# Patient Record
Sex: Female | Born: 1960
Health system: Southern US, Community
[De-identification: ages and names within clinical notes are randomized; demographics above are authoritative.]

## PROBLEM LIST (undated history)

## (undated) DIAGNOSIS — T7840XA Allergy, unspecified, initial encounter: Secondary | ICD-10-CM

## (undated) DIAGNOSIS — N189 Chronic kidney disease, unspecified: Secondary | ICD-10-CM

## (undated) DIAGNOSIS — I341 Nonrheumatic mitral (valve) prolapse: Secondary | ICD-10-CM

## (undated) DIAGNOSIS — I471 Supraventricular tachycardia: Secondary | ICD-10-CM

## (undated) DIAGNOSIS — F419 Anxiety disorder, unspecified: Secondary | ICD-10-CM

## (undated) DIAGNOSIS — I10 Essential (primary) hypertension: Secondary | ICD-10-CM

## (undated) DIAGNOSIS — J90 Pleural effusion, not elsewhere classified: Secondary | ICD-10-CM

## (undated) DIAGNOSIS — Z9889 Other specified postprocedural states: Secondary | ICD-10-CM

## (undated) DIAGNOSIS — Z8579 Personal history of other malignant neoplasms of lymphoid, hematopoietic and related tissues: Secondary | ICD-10-CM

## (undated) DIAGNOSIS — I340 Nonrheumatic mitral (valve) insufficiency: Secondary | ICD-10-CM

## (undated) HISTORY — DX: Supraventricular tachycardia: I47.1

## (undated) HISTORY — DX: Essential (primary) hypertension: I10

## (undated) HISTORY — DX: Nonrheumatic mitral (valve) prolapse: I34.1

## (undated) HISTORY — DX: Allergy, unspecified, initial encounter: T78.40XA

## (undated) HISTORY — DX: Pleural effusion, not elsewhere classified: J90

## (undated) HISTORY — DX: Nonrheumatic mitral (valve) insufficiency: I34.0

## (undated) HISTORY — DX: Personal history of other malignant neoplasms of lymphoid, hematopoietic and related tissues: Z85.79

---
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
## 1989-06-14 HISTORY — PX: OOPHORECTOMY: SHX86

## 1998-02-05 ENCOUNTER — Other Ambulatory Visit: Admission: RE | Admit: 1998-02-05 | Discharge: 1998-02-05 | Payer: Self-pay | Admitting: *Deleted

## 1998-06-14 HISTORY — PX: BREAST ENHANCEMENT SURGERY: SHX7

## 1999-03-25 ENCOUNTER — Other Ambulatory Visit: Admission: RE | Admit: 1999-03-25 | Discharge: 1999-03-25 | Payer: Self-pay | Admitting: *Deleted

## 2000-03-23 ENCOUNTER — Other Ambulatory Visit: Admission: RE | Admit: 2000-03-23 | Discharge: 2000-03-23 | Payer: Self-pay | Admitting: *Deleted

## 2001-10-05 ENCOUNTER — Other Ambulatory Visit: Admission: RE | Admit: 2001-10-05 | Discharge: 2001-10-05 | Payer: Self-pay | Admitting: *Deleted

## 2003-04-11 ENCOUNTER — Other Ambulatory Visit: Admission: RE | Admit: 2003-04-11 | Discharge: 2003-04-11 | Payer: Self-pay | Admitting: *Deleted

## 2004-10-15 ENCOUNTER — Ambulatory Visit: Payer: Self-pay | Admitting: Family Medicine

## 2004-10-22 ENCOUNTER — Ambulatory Visit: Payer: Self-pay | Admitting: Family Medicine

## 2004-12-10 ENCOUNTER — Ambulatory Visit: Payer: Self-pay | Admitting: Family Medicine

## 2005-02-11 ENCOUNTER — Other Ambulatory Visit: Admission: RE | Admit: 2005-02-11 | Discharge: 2005-02-11 | Payer: Self-pay | Admitting: Obstetrics & Gynecology

## 2005-08-19 ENCOUNTER — Ambulatory Visit: Payer: Self-pay | Admitting: Family Medicine

## 2006-02-18 ENCOUNTER — Encounter: Admission: RE | Admit: 2006-02-18 | Discharge: 2006-02-18 | Payer: Self-pay | Admitting: Family Medicine

## 2006-02-18 ENCOUNTER — Ambulatory Visit: Payer: Self-pay | Admitting: Family Medicine

## 2006-03-03 ENCOUNTER — Ambulatory Visit: Payer: Self-pay | Admitting: Family Medicine

## 2006-03-10 ENCOUNTER — Ambulatory Visit: Payer: Self-pay | Admitting: Family Medicine

## 2006-03-31 ENCOUNTER — Ambulatory Visit: Payer: Self-pay | Admitting: Family Medicine

## 2006-04-18 ENCOUNTER — Ambulatory Visit: Payer: Self-pay | Admitting: Family Medicine

## 2006-04-19 ENCOUNTER — Ambulatory Visit: Payer: Self-pay | Admitting: Family Medicine

## 2006-04-19 LAB — CONVERTED CEMR LAB
ALT: 17 units/L (ref 0–40)
AST: 20 units/L (ref 0–37)
Albumin: 3.6 g/dL (ref 3.5–5.2)
Alkaline Phosphatase: 34 units/L — ABNORMAL LOW (ref 39–117)
Basophils Absolute: 0 10*3/uL (ref 0.0–0.1)
Glomerular Filtration Rate, Af Am: 33 mL/min/{1.73_m2}
Glucose, Bld: 86 mg/dL (ref 70–99)
Lymphocytes Relative: 18.5 % (ref 12.0–46.0)
MCV: 96.3 fL (ref 78.0–100.0)
Monocytes Relative: 10.7 % (ref 3.0–11.0)
Neutro Abs: 2.9 10*3/uL (ref 1.4–7.7)
Platelets: 211 10*3/uL (ref 150–400)
Potassium: 3.2 meq/L — ABNORMAL LOW (ref 3.5–5.1)
RBC: 4.09 M/uL (ref 3.87–5.11)
Sodium: 140 meq/L (ref 135–145)
Total Bilirubin: 0.6 mg/dL (ref 0.3–1.2)
Total Protein: 7.1 g/dL (ref 6.0–8.3)
WBC: 4 10*3/uL — ABNORMAL LOW (ref 4.5–10.5)

## 2006-04-21 ENCOUNTER — Ambulatory Visit: Payer: Self-pay | Admitting: Oncology

## 2006-04-21 ENCOUNTER — Encounter: Admission: RE | Admit: 2006-04-21 | Discharge: 2006-04-21 | Payer: Self-pay | Admitting: Family Medicine

## 2006-05-11 ENCOUNTER — Other Ambulatory Visit: Admission: RE | Admit: 2006-05-11 | Discharge: 2006-05-11 | Payer: Self-pay | Admitting: Oncology

## 2006-05-11 ENCOUNTER — Encounter (INDEPENDENT_AMBULATORY_CARE_PROVIDER_SITE_OTHER): Payer: Self-pay | Admitting: *Deleted

## 2006-05-11 ENCOUNTER — Ambulatory Visit (HOSPITAL_COMMUNITY): Admission: RE | Admit: 2006-05-11 | Discharge: 2006-05-11 | Payer: Self-pay | Admitting: Oncology

## 2006-05-11 LAB — CBC WITH DIFFERENTIAL/PLATELET
BASO%: 0.4 % (ref 0.0–2.0)
EOS%: 0.2 % (ref 0.0–7.0)
HCT: 36.4 % (ref 34.8–46.6)
LYMPH%: 23.4 % (ref 14.0–48.0)
MCH: 32.4 pg (ref 26.0–34.0)
MCHC: 35.2 g/dL (ref 32.0–36.0)
MCV: 92.1 fL (ref 81.0–101.0)
MONO#: 0.4 10*3/uL (ref 0.1–0.9)
MONO%: 9.6 % (ref 0.0–13.0)
NEUT%: 66.4 % (ref 39.6–76.8)
Platelets: 232 10*3/uL (ref 145–400)

## 2006-05-14 LAB — COMPREHENSIVE METABOLIC PANEL WITH GFR
ALT: 13 U/L (ref 0–35)
AST: 15 U/L (ref 0–37)
Albumin: 3.9 g/dL (ref 3.5–5.2)
Alkaline Phosphatase: 34 U/L — ABNORMAL LOW (ref 39–117)
BUN: 27 mg/dL — ABNORMAL HIGH (ref 6–23)
CO2: 17 meq/L — ABNORMAL LOW (ref 19–32)
Calcium: 8.8 mg/dL (ref 8.4–10.5)
Chloride: 110 meq/L (ref 96–112)
Creatinine, Ser: 2.04 mg/dL — ABNORMAL HIGH (ref 0.40–1.20)
Glucose, Bld: 99 mg/dL (ref 70–99)
Potassium: 3.3 meq/L — ABNORMAL LOW (ref 3.5–5.3)
Sodium: 138 meq/L (ref 135–145)
Total Bilirubin: 0.5 mg/dL (ref 0.3–1.2)
Total Protein: 6.7 g/dL (ref 6.0–8.3)

## 2006-05-14 LAB — KAPPA/LAMBDA LIGHT CHAINS
Kappa free light chain: 8.44 mg/dL — ABNORMAL HIGH (ref 0.33–1.94)
Kappa:Lambda Ratio: 13.84 — ABNORMAL HIGH (ref 0.26–1.65)
Lambda Free Lght Chn: 0.61 mg/dL (ref 0.57–2.63)

## 2006-05-16 LAB — UIFE/LIGHT CHAINS/TP QN, 24-HR UR
Alpha 1, Urine: DETECTED — AB
Alpha 2, Urine: DETECTED — AB
Free Kappa Lt Chains,Ur: 6.3 mg/dL — ABNORMAL HIGH (ref 0.04–1.51)
Free Lambda Excretion/Day: 115.08 mg/d
Gamma Globulin, Urine: DETECTED — AB
Time: 24 hours

## 2006-05-25 ENCOUNTER — Ambulatory Visit: Payer: Self-pay | Admitting: Oncology

## 2006-05-27 LAB — BASIC METABOLIC PANEL
BUN: 24 mg/dL — ABNORMAL HIGH (ref 6–23)
Calcium: 9.2 mg/dL (ref 8.4–10.5)
Creatinine, Ser: 1.95 mg/dL — ABNORMAL HIGH (ref 0.40–1.20)
Potassium: 3.8 mEq/L (ref 3.5–5.3)

## 2006-05-27 LAB — CBC WITH DIFFERENTIAL/PLATELET
Basophils Absolute: 0 10*3/uL (ref 0.0–0.1)
EOS%: 0.9 % (ref 0.0–7.0)
HGB: 13 g/dL (ref 11.6–15.9)
LYMPH%: 27.1 % (ref 14.0–48.0)
MCH: 32.6 pg (ref 26.0–34.0)
MCV: 93.1 fL (ref 81.0–101.0)
MONO%: 11.2 % (ref 0.0–13.0)
NEUT%: 59.8 % (ref 39.6–76.8)
Platelets: 196 10*3/uL (ref 145–400)
RDW: 12.7 % (ref 11.3–14.5)

## 2006-05-27 LAB — PREGNANCY, URINE: Preg Test, Ur: NEGATIVE

## 2006-06-03 LAB — BASIC METABOLIC PANEL
CO2: 20 mEq/L (ref 19–32)
Chloride: 107 mEq/L (ref 96–112)
Sodium: 139 mEq/L (ref 135–145)

## 2006-06-03 LAB — CBC WITH DIFFERENTIAL/PLATELET
BASO%: 1.1 % (ref 0.0–2.0)
EOS%: 1.6 % (ref 0.0–7.0)
MCH: 32.3 pg (ref 26.0–34.0)
MCHC: 34.8 g/dL (ref 32.0–36.0)
MONO#: 0.4 10*3/uL (ref 0.1–0.9)
RBC: 4.12 10*6/uL (ref 3.70–5.32)
RDW: 12 % (ref 11.3–14.5)
WBC: 5.4 10*3/uL (ref 3.9–10.0)
lymph#: 1.6 10*3/uL (ref 0.9–3.3)

## 2006-06-10 LAB — CBC WITH DIFFERENTIAL/PLATELET
Basophils Absolute: 0 10*3/uL (ref 0.0–0.1)
EOS%: 3.4 % (ref 0.0–7.0)
Eosinophils Absolute: 0.2 10*3/uL (ref 0.0–0.5)
HCT: 36.6 % (ref 34.8–46.6)
HGB: 12.5 g/dL (ref 11.6–15.9)
MCH: 32.1 pg (ref 26.0–34.0)
MCV: 93.8 fL (ref 81.0–101.0)
MONO%: 6.7 % (ref 0.0–13.0)
NEUT#: 3.3 10*3/uL (ref 1.5–6.5)
NEUT%: 67.8 % (ref 39.6–76.8)
RDW: 12.3 % (ref 11.3–14.5)
lymph#: 1 10*3/uL (ref 0.9–3.3)

## 2006-06-14 HISTORY — PX: LIMBAL STEM CELL TRANSPLANT: SHX1969

## 2006-06-20 LAB — BASIC METABOLIC PANEL
BUN: 30 mg/dL — ABNORMAL HIGH (ref 6–23)
Calcium: 9.8 mg/dL (ref 8.4–10.5)
Glucose, Bld: 83 mg/dL (ref 70–99)

## 2006-06-20 LAB — CBC WITH DIFFERENTIAL/PLATELET
Basophils Absolute: 0 10*3/uL (ref 0.0–0.1)
Eosinophils Absolute: 0.2 10*3/uL (ref 0.0–0.5)
HGB: 12 g/dL (ref 11.6–15.9)
LYMPH%: 18.8 % (ref 14.0–48.0)
MCV: 92.1 fL (ref 81.0–101.0)
MONO%: 9.8 % (ref 0.0–13.0)
NEUT#: 2.6 10*3/uL (ref 1.5–6.5)
Platelets: 137 10*3/uL — ABNORMAL LOW (ref 145–400)

## 2006-06-20 LAB — HCG, SERUM, QUALITATIVE: Preg, Serum: NEGATIVE

## 2006-06-29 LAB — CBC WITH DIFFERENTIAL/PLATELET
BASO%: 0.1 % (ref 0.0–2.0)
EOS%: 0 % (ref 0.0–7.0)
MCH: 32.5 pg (ref 26.0–34.0)
MCHC: 34.9 g/dL (ref 32.0–36.0)
NEUT%: 92.4 % — ABNORMAL HIGH (ref 39.6–76.8)
RDW: 12.2 % (ref 11.3–14.5)
lymph#: 0.3 10*3/uL — ABNORMAL LOW (ref 0.9–3.3)

## 2006-07-04 ENCOUNTER — Ambulatory Visit: Payer: Self-pay | Admitting: Oncology

## 2006-07-06 LAB — CBC WITH DIFFERENTIAL/PLATELET
Basophils Absolute: 0 10*3/uL (ref 0.0–0.1)
EOS%: 0.1 % (ref 0.0–7.0)
HCT: 33.9 % — ABNORMAL LOW (ref 34.8–46.6)
HGB: 11.7 g/dL (ref 11.6–15.9)
MONO#: 0.1 10*3/uL (ref 0.1–0.9)
NEUT#: 4.8 10*3/uL (ref 1.5–6.5)
NEUT%: 91.8 % — ABNORMAL HIGH (ref 39.6–76.8)
RDW: 13.1 % (ref 11.3–14.5)
WBC: 5.2 10*3/uL (ref 3.9–10.0)
lymph#: 0.3 10*3/uL — ABNORMAL LOW (ref 0.9–3.3)

## 2006-07-06 LAB — BASIC METABOLIC PANEL
BUN: 22 mg/dL (ref 6–23)
CO2: 22 mEq/L (ref 19–32)
Chloride: 107 mEq/L (ref 96–112)
Creatinine, Ser: 2.2 mg/dL — ABNORMAL HIGH (ref 0.40–1.20)
Potassium: 3.1 mEq/L — ABNORMAL LOW (ref 3.5–5.3)

## 2006-07-07 LAB — MAGNESIUM: Magnesium: 2.1 mg/dL (ref 1.5–2.5)

## 2006-07-14 LAB — CBC WITH DIFFERENTIAL/PLATELET
BASO%: 2.1 % — ABNORMAL HIGH (ref 0.0–2.0)
Basophils Absolute: 0.1 10*3/uL (ref 0.0–0.1)
HCT: 32.7 % — ABNORMAL LOW (ref 34.8–46.6)
HGB: 11.3 g/dL — ABNORMAL LOW (ref 11.6–15.9)
LYMPH%: 15.9 % (ref 14.0–48.0)
MCH: 32.5 pg (ref 26.0–34.0)
MCHC: 34.7 g/dL (ref 32.0–36.0)
MONO#: 0.5 10*3/uL (ref 0.1–0.9)
NEUT%: 65.7 % (ref 39.6–76.8)
Platelets: 95 10*3/uL — ABNORMAL LOW (ref 145–400)
WBC: 4 10*3/uL (ref 3.9–10.0)
lymph#: 0.6 10*3/uL — ABNORMAL LOW (ref 0.9–3.3)

## 2006-07-17 LAB — KAPPA/LAMBDA LIGHT CHAINS
Kappa free light chain: 8.62 mg/dL — ABNORMAL HIGH (ref 0.33–1.94)
Kappa:Lambda Ratio: 7.56 — ABNORMAL HIGH (ref 0.26–1.65)
Lambda Free Lght Chn: 1.14 mg/dL (ref 0.57–2.63)

## 2006-07-17 LAB — COMPREHENSIVE METABOLIC PANEL
BUN: 25 mg/dL — ABNORMAL HIGH (ref 6–23)
CO2: 22 mEq/L (ref 19–32)
Calcium: 9.2 mg/dL (ref 8.4–10.5)
Chloride: 106 mEq/L (ref 96–112)
Creatinine, Ser: 2.11 mg/dL — ABNORMAL HIGH (ref 0.40–1.20)
Glucose, Bld: 68 mg/dL — ABNORMAL LOW (ref 70–99)
Total Bilirubin: 0.7 mg/dL (ref 0.3–1.2)

## 2006-07-17 LAB — HCG, SERUM, QUALITATIVE: Preg, Serum: NEGATIVE

## 2006-07-17 LAB — MAGNESIUM: Magnesium: 1.9 mg/dL (ref 1.5–2.5)

## 2006-07-18 LAB — CBC WITH DIFFERENTIAL/PLATELET
BASO%: 1.4 % (ref 0.0–2.0)
Basophils Absolute: 0.1 10*3/uL (ref 0.0–0.1)
EOS%: 4 % (ref 0.0–7.0)
HGB: 11.9 g/dL (ref 11.6–15.9)
MCH: 32.8 pg (ref 26.0–34.0)
MCHC: 35.9 g/dL (ref 32.0–36.0)
MCV: 91.3 fL (ref 81.0–101.0)
MONO%: 10.4 % (ref 0.0–13.0)
RDW: 12.9 % (ref 11.3–14.5)
lymph#: 0.7 10*3/uL — ABNORMAL LOW (ref 0.9–3.3)

## 2006-07-27 LAB — CBC WITH DIFFERENTIAL/PLATELET
EOS%: 1.7 % (ref 0.0–7.0)
Eosinophils Absolute: 0.1 10*3/uL (ref 0.0–0.5)
HGB: 10.8 g/dL — ABNORMAL LOW (ref 11.6–15.9)
MCH: 32.3 pg (ref 26.0–34.0)
MCV: 91.3 fL (ref 81.0–101.0)
MONO%: 12.1 % (ref 0.0–13.0)
NEUT#: 1.6 10*3/uL (ref 1.5–6.5)
RBC: 3.36 10*6/uL — ABNORMAL LOW (ref 3.70–5.32)
RDW: 12.7 % (ref 11.3–14.5)
lymph#: 1.1 10*3/uL (ref 0.9–3.3)

## 2006-07-27 LAB — BASIC METABOLIC PANEL
Chloride: 106 mEq/L (ref 96–112)
Potassium: 3.1 mEq/L — ABNORMAL LOW (ref 3.5–5.3)

## 2006-07-27 LAB — PREGNANCY, URINE: Preg Test, Ur: NEGATIVE

## 2006-07-27 LAB — MAGNESIUM: Magnesium: 2.6 mg/dL — ABNORMAL HIGH (ref 1.5–2.5)

## 2006-07-30 LAB — IGG, IGA, IGM
IgG (Immunoglobin G), Serum: 495 mg/dL — ABNORMAL LOW (ref 694–1618)
IgM, Serum: 37 mg/dL — ABNORMAL LOW (ref 60–263)

## 2006-07-30 LAB — BETA 2 MICROGLOBULIN, SERUM: Beta-2 Microglobulin: 3.65 mg/L — ABNORMAL HIGH (ref 1.01–1.73)

## 2006-08-01 LAB — BASIC METABOLIC PANEL
BUN: 19 mg/dL (ref 6–23)
CO2: 25 mEq/L (ref 19–32)
Calcium: 9.2 mg/dL (ref 8.4–10.5)
Chloride: 110 mEq/L (ref 96–112)
Creatinine, Ser: 2.15 mg/dL — ABNORMAL HIGH (ref 0.40–1.20)

## 2006-08-01 LAB — CBC WITH DIFFERENTIAL/PLATELET
BASO%: 1.6 % (ref 0.0–2.0)
Basophils Absolute: 0 10*3/uL (ref 0.0–0.1)
HCT: 29.4 % — ABNORMAL LOW (ref 34.8–46.6)
HGB: 10.4 g/dL — ABNORMAL LOW (ref 11.6–15.9)
LYMPH%: 39.7 % (ref 14.0–48.0)
MCH: 32.7 pg (ref 26.0–34.0)
MCHC: 35.5 g/dL (ref 32.0–36.0)
MONO#: 0.3 10*3/uL (ref 0.1–0.9)
NEUT%: 41.9 % (ref 39.6–76.8)
Platelets: 166 10*3/uL (ref 145–400)
WBC: 2.5 10*3/uL — ABNORMAL LOW (ref 3.9–10.0)

## 2006-08-08 LAB — CBC WITH DIFFERENTIAL/PLATELET
BASO%: 1.3 % (ref 0.0–2.0)
EOS%: 2.3 % (ref 0.0–7.0)
Eosinophils Absolute: 0.1 10*3/uL (ref 0.0–0.5)
LYMPH%: 35.5 % (ref 14.0–48.0)
MCH: 33 pg (ref 26.0–34.0)
MCHC: 35.3 g/dL (ref 32.0–36.0)
MCV: 93.3 fL (ref 81.0–101.0)
MONO%: 7 % (ref 0.0–13.0)
NEUT#: 1.4 10*3/uL — ABNORMAL LOW (ref 1.5–6.5)
Platelets: 196 10*3/uL (ref 145–400)
RBC: 3.31 10*6/uL — ABNORMAL LOW (ref 3.70–5.32)
RDW: 15.6 % — ABNORMAL HIGH (ref 11.3–14.5)

## 2006-08-08 LAB — PREGNANCY, URINE: Preg Test, Ur: NEGATIVE

## 2006-08-17 ENCOUNTER — Ambulatory Visit: Payer: Self-pay | Admitting: Oncology

## 2006-08-22 LAB — COMPREHENSIVE METABOLIC PANEL
Albumin: 4.5 g/dL (ref 3.5–5.2)
BUN: 24 mg/dL — ABNORMAL HIGH (ref 6–23)
CO2: 19 mEq/L (ref 19–32)
Glucose, Bld: 172 mg/dL — ABNORMAL HIGH (ref 70–99)
Potassium: 3.9 mEq/L (ref 3.5–5.3)
Sodium: 139 mEq/L (ref 135–145)
Total Protein: 6.7 g/dL (ref 6.0–8.3)

## 2006-08-22 LAB — CBC WITH DIFFERENTIAL/PLATELET
Basophils Absolute: 0 10*3/uL (ref 0.0–0.1)
Eosinophils Absolute: 0 10*3/uL (ref 0.0–0.5)
HGB: 11.1 g/dL — ABNORMAL LOW (ref 11.6–15.9)
LYMPH%: 6.2 % — ABNORMAL LOW (ref 14.0–48.0)
MCV: 94.7 fL (ref 81.0–101.0)
MONO#: 0 10*3/uL — ABNORMAL LOW (ref 0.1–0.9)
MONO%: 0.3 % (ref 0.0–13.0)
NEUT#: 3.4 10*3/uL (ref 1.5–6.5)
Platelets: 146 10*3/uL (ref 145–400)
RBC: 3.3 10*6/uL — ABNORMAL LOW (ref 3.70–5.32)
RDW: 16.3 % — ABNORMAL HIGH (ref 11.3–14.5)
WBC: 3.6 10*3/uL — ABNORMAL LOW (ref 3.9–10.0)

## 2006-09-01 LAB — CBC WITH DIFFERENTIAL/PLATELET
Basophils Absolute: 0 10*3/uL (ref 0.0–0.1)
Eosinophils Absolute: 0 10*3/uL (ref 0.0–0.5)
HGB: 10.2 g/dL — ABNORMAL LOW (ref 11.6–15.9)
MONO#: 0.3 10*3/uL (ref 0.1–0.9)
NEUT#: 1.3 10*3/uL — ABNORMAL LOW (ref 1.5–6.5)
RBC: 3.02 10*6/uL — ABNORMAL LOW (ref 3.70–5.32)
RDW: 15.7 % — ABNORMAL HIGH (ref 11.3–14.5)
WBC: 2.3 10*3/uL — ABNORMAL LOW (ref 3.9–10.0)
lymph#: 0.7 10*3/uL — ABNORMAL LOW (ref 0.9–3.3)

## 2006-09-05 LAB — BASIC METABOLIC PANEL
CO2: 22 mEq/L (ref 19–32)
Chloride: 107 mEq/L (ref 96–112)
Glucose, Bld: 111 mg/dL — ABNORMAL HIGH (ref 70–99)
Potassium: 3.6 mEq/L (ref 3.5–5.3)
Sodium: 140 mEq/L (ref 135–145)

## 2006-09-05 LAB — KAPPA/LAMBDA LIGHT CHAINS
Kappa free light chain: 4.93 mg/dL — ABNORMAL HIGH (ref 0.33–1.94)
Kappa:Lambda Ratio: 3.33 — ABNORMAL HIGH (ref 0.26–1.65)
Lambda Free Lght Chn: 1.48 mg/dL (ref 0.57–2.63)

## 2006-09-05 LAB — IGG, IGA, IGM: IgG (Immunoglobin G), Serum: 442 mg/dL — ABNORMAL LOW (ref 694–1618)

## 2006-09-05 LAB — BETA 2 MICROGLOBULIN, SERUM: Beta-2 Microglobulin: 3.06 mg/L — ABNORMAL HIGH (ref 1.01–1.73)

## 2006-09-06 LAB — UIFE/LIGHT CHAINS/TP QN, 24-HR UR
Alpha 2, Urine: DETECTED — AB
Beta, Urine: DETECTED — AB
Free Kappa Lt Chains,Ur: 53.2 mg/dL — ABNORMAL HIGH (ref 0.04–1.51)
Free Lt Chn Excr Rate: 904.4 mg/d
Gamma Globulin, Urine: DETECTED — AB
Total Protein, Urine-Ur/day: 1125 mg/d — ABNORMAL HIGH (ref 10–140)

## 2006-09-12 LAB — CBC WITH DIFFERENTIAL/PLATELET
Basophils Absolute: 0.1 10*3/uL (ref 0.0–0.1)
Eosinophils Absolute: 0 10*3/uL (ref 0.0–0.5)
HGB: 11.7 g/dL (ref 11.6–15.9)
MCV: 96.2 fL (ref 81.0–101.0)
MONO%: 3.6 % (ref 0.0–13.0)
NEUT#: 2.5 10*3/uL (ref 1.5–6.5)
RDW: 16 % — ABNORMAL HIGH (ref 11.3–14.5)

## 2006-09-12 LAB — BASIC METABOLIC PANEL
BUN: 27 mg/dL — ABNORMAL HIGH (ref 6–23)
Chloride: 109 mEq/L (ref 96–112)
Glucose, Bld: 90 mg/dL (ref 70–99)
Potassium: 4.4 mEq/L (ref 3.5–5.3)

## 2006-09-15 ENCOUNTER — Other Ambulatory Visit: Admission: RE | Admit: 2006-09-15 | Discharge: 2006-09-15 | Payer: Self-pay | Admitting: Oncology

## 2006-09-15 ENCOUNTER — Encounter (INDEPENDENT_AMBULATORY_CARE_PROVIDER_SITE_OTHER): Payer: Self-pay | Admitting: *Deleted

## 2006-09-28 ENCOUNTER — Ambulatory Visit: Payer: Self-pay | Admitting: Oncology

## 2006-10-03 LAB — CBC WITH DIFFERENTIAL/PLATELET
BASO%: 0.5 % (ref 0.0–2.0)
Basophils Absolute: 0 10*3/uL (ref 0.0–0.1)
HCT: 35.1 % (ref 34.8–46.6)
HGB: 12.4 g/dL (ref 11.6–15.9)
MONO#: 0.3 10*3/uL (ref 0.1–0.9)
NEUT%: 53.8 % (ref 39.6–76.8)
WBC: 2.9 10*3/uL — ABNORMAL LOW (ref 3.9–10.0)
lymph#: 0.9 10*3/uL (ref 0.9–3.3)

## 2006-10-03 LAB — BASIC METABOLIC PANEL
BUN: 24 mg/dL — ABNORMAL HIGH (ref 6–23)
CO2: 23 mEq/L (ref 19–32)
Chloride: 108 mEq/L (ref 96–112)
Creatinine, Ser: 1.95 mg/dL — ABNORMAL HIGH (ref 0.40–1.20)

## 2006-10-24 LAB — CBC WITH DIFFERENTIAL/PLATELET
Basophils Absolute: 0 10*3/uL (ref 0.0–0.1)
Eosinophils Absolute: 0 10*3/uL (ref 0.0–0.5)
HCT: 35 % (ref 34.8–46.6)
LYMPH%: 20.4 % (ref 14.0–48.0)
MCV: 92.5 fL (ref 81.0–101.0)
MONO%: 2.5 % (ref 0.0–13.0)
NEUT#: 2.7 10*3/uL (ref 1.5–6.5)
NEUT%: 75.7 % (ref 39.6–76.8)
Platelets: 97 10*3/uL — ABNORMAL LOW (ref 145–400)
RBC: 3.79 10*6/uL (ref 3.70–5.32)

## 2006-10-26 LAB — CBC WITH DIFFERENTIAL/PLATELET
BASO%: 1.2 % (ref 0.0–2.0)
EOS%: 1.3 % (ref 0.0–7.0)
LYMPH%: 91.6 % — ABNORMAL HIGH (ref 14.0–48.0)
MCH: 33.8 pg (ref 26.0–34.0)
MCHC: 36 g/dL (ref 32.0–36.0)
MCV: 94 fL (ref 81.0–101.0)
MONO%: 3.7 % (ref 0.0–13.0)
NEUT%: 2.2 % — ABNORMAL LOW (ref 39.6–76.8)
Platelets: 53 10*3/uL — ABNORMAL LOW (ref 145–400)
RBC: 3.64 10*6/uL — ABNORMAL LOW (ref 3.70–5.32)
WBC: 0.7 10*3/uL — CL (ref 3.9–10.0)

## 2006-11-02 LAB — CBC WITH DIFFERENTIAL/PLATELET
Basophils Absolute: 0 10*3/uL (ref 0.0–0.1)
Eosinophils Absolute: 0 10*3/uL (ref 0.0–0.5)
HCT: 26.7 % — ABNORMAL LOW (ref 34.8–46.6)
HGB: 9.7 g/dL — ABNORMAL LOW (ref 11.6–15.9)
MCV: 93.8 fL (ref 81.0–101.0)
NEUT#: 6 10*3/uL (ref 1.5–6.5)
NEUT%: 72.1 % (ref 39.6–76.8)
RDW: 12.3 % (ref 11.3–14.5)
lymph#: 1.4 10*3/uL (ref 0.9–3.3)

## 2006-11-08 ENCOUNTER — Ambulatory Visit: Payer: Self-pay | Admitting: Oncology

## 2006-11-08 ENCOUNTER — Ambulatory Visit (HOSPITAL_COMMUNITY): Admission: RE | Admit: 2006-11-08 | Discharge: 2006-11-08 | Payer: Self-pay | Admitting: Oncology

## 2006-11-10 LAB — CBC WITH DIFFERENTIAL/PLATELET
Basophils Absolute: 0 10*3/uL (ref 0.0–0.1)
Eosinophils Absolute: 0.1 10*3/uL (ref 0.0–0.5)
HGB: 10.4 g/dL — ABNORMAL LOW (ref 11.6–15.9)
LYMPH%: 8 % — ABNORMAL LOW (ref 14.0–48.0)
MCH: 33.6 pg (ref 26.0–34.0)
MCV: 94.3 fL (ref 81.0–101.0)
MONO%: 10.8 % (ref 0.0–13.0)
NEUT#: 7 10*3/uL — ABNORMAL HIGH (ref 1.5–6.5)
NEUT%: 80.5 % — ABNORMAL HIGH (ref 39.6–76.8)
Platelets: 393 10*3/uL (ref 145–400)

## 2006-11-10 LAB — BASIC METABOLIC PANEL
BUN: 19 mg/dL (ref 6–23)
Calcium: 9 mg/dL (ref 8.4–10.5)
Creatinine, Ser: 1.83 mg/dL — ABNORMAL HIGH (ref 0.40–1.20)
Glucose, Bld: 88 mg/dL (ref 70–99)
Potassium: 4 mEq/L (ref 3.5–5.3)

## 2006-11-16 ENCOUNTER — Ambulatory Visit: Payer: Self-pay | Admitting: Family Medicine

## 2006-11-24 LAB — CBC WITH DIFFERENTIAL/PLATELET
BASO%: 0.6 % (ref 0.0–2.0)
LYMPH%: 26.1 % (ref 14.0–48.0)
MCHC: 35.4 g/dL (ref 32.0–36.0)
MONO#: 0.4 10*3/uL (ref 0.1–0.9)
Platelets: 285 10*3/uL (ref 145–400)
RBC: 3.26 10*6/uL — ABNORMAL LOW (ref 3.70–5.32)
RDW: 14.9 % — ABNORMAL HIGH (ref 11.3–14.5)
WBC: 3.7 10*3/uL — ABNORMAL LOW (ref 3.9–10.0)

## 2006-11-25 LAB — CA 125: CA 125: 28.1 U/mL (ref 0.0–30.2)

## 2007-01-03 ENCOUNTER — Ambulatory Visit: Payer: Self-pay | Admitting: Oncology

## 2007-01-05 ENCOUNTER — Ambulatory Visit (HOSPITAL_COMMUNITY): Admission: RE | Admit: 2007-01-05 | Discharge: 2007-01-05 | Payer: Self-pay | Admitting: Oncology

## 2007-01-05 LAB — CBC WITH DIFFERENTIAL/PLATELET
BASO%: 1.1 % (ref 0.0–2.0)
Basophils Absolute: 0 10*3/uL (ref 0.0–0.1)
EOS%: 5.2 % (ref 0.0–7.0)
HGB: 11.6 g/dL (ref 11.6–15.9)
MCH: 33.2 pg (ref 26.0–34.0)
MCHC: 36.2 g/dL — ABNORMAL HIGH (ref 32.0–36.0)
RDW: 18.4 % — ABNORMAL HIGH (ref 11.3–14.5)
lymph#: 0.7 10*3/uL — ABNORMAL LOW (ref 0.9–3.3)

## 2007-01-05 LAB — MAGNESIUM: Magnesium: 2.1 mg/dL (ref 1.5–2.5)

## 2007-01-05 LAB — COMPREHENSIVE METABOLIC PANEL
BUN: 14 mg/dL (ref 6–23)
CO2: 21 mEq/L (ref 19–32)
Calcium: 9.1 mg/dL (ref 8.4–10.5)
Creatinine, Ser: 1.35 mg/dL — ABNORMAL HIGH (ref 0.40–1.20)
Glucose, Bld: 84 mg/dL (ref 70–99)
Total Bilirubin: 0.6 mg/dL (ref 0.3–1.2)

## 2007-01-13 DIAGNOSIS — T7840XA Allergy, unspecified, initial encounter: Secondary | ICD-10-CM | POA: Insufficient documentation

## 2007-02-09 LAB — CBC WITH DIFFERENTIAL/PLATELET
BASO%: 0.6 % (ref 0.0–2.0)
Basophils Absolute: 0 10*3/uL (ref 0.0–0.1)
HCT: 34.2 % — ABNORMAL LOW (ref 34.8–46.6)
HGB: 12.3 g/dL (ref 11.6–15.9)
MONO#: 0.6 10*3/uL (ref 0.1–0.9)
NEUT#: 1.7 10*3/uL (ref 1.5–6.5)
NEUT%: 44 % (ref 39.6–76.8)
RDW: 16.6 % — ABNORMAL HIGH (ref 11.3–14.5)
WBC: 3.8 10*3/uL — ABNORMAL LOW (ref 3.9–10.0)
lymph#: 1.5 10*3/uL (ref 0.9–3.3)

## 2007-02-09 LAB — BASIC METABOLIC PANEL
BUN: 24 mg/dL — ABNORMAL HIGH (ref 6–23)
CO2: 23 mEq/L (ref 19–32)
Chloride: 105 mEq/L (ref 96–112)
Creatinine, Ser: 1.7 mg/dL — ABNORMAL HIGH (ref 0.40–1.20)
Potassium: 4.3 mEq/L (ref 3.5–5.3)

## 2007-03-13 ENCOUNTER — Ambulatory Visit: Payer: Self-pay | Admitting: Family Medicine

## 2007-03-13 DIAGNOSIS — C9001 Multiple myeloma in remission: Secondary | ICD-10-CM | POA: Insufficient documentation

## 2007-04-10 ENCOUNTER — Ambulatory Visit: Payer: Self-pay | Admitting: Family Medicine

## 2007-05-08 ENCOUNTER — Ambulatory Visit: Payer: Self-pay | Admitting: Oncology

## 2007-05-15 LAB — BASIC METABOLIC PANEL
Chloride: 106 mEq/L (ref 96–112)
Creatinine, Ser: 1.57 mg/dL — ABNORMAL HIGH (ref 0.40–1.20)

## 2007-05-15 LAB — CBC WITH DIFFERENTIAL/PLATELET
Basophils Absolute: 0 10*3/uL (ref 0.0–0.1)
EOS%: 2.1 % (ref 0.0–7.0)
Eosinophils Absolute: 0.1 10*3/uL (ref 0.0–0.5)
HGB: 14.3 g/dL (ref 11.6–15.9)
LYMPH%: 23.2 % (ref 14.0–48.0)
MCH: 34.3 pg — ABNORMAL HIGH (ref 26.0–34.0)
MCV: 95.2 fL (ref 81.0–101.0)
MONO%: 10.3 % (ref 0.0–13.0)
Platelets: 170 10*3/uL (ref 145–400)
RDW: 13.2 % (ref 11.3–14.5)

## 2007-08-10 ENCOUNTER — Ambulatory Visit: Payer: Self-pay | Admitting: Oncology

## 2007-08-14 LAB — CBC WITH DIFFERENTIAL/PLATELET
BASO%: 1.4 % (ref 0.0–2.0)
EOS%: 0.2 % (ref 0.0–7.0)
Eosinophils Absolute: 0 10*3/uL (ref 0.0–0.5)
LYMPH%: 26 % (ref 14.0–48.0)
MCHC: 35.6 g/dL (ref 32.0–36.0)
MCV: 92.4 fL (ref 81.0–101.0)
MONO%: 18.1 % — ABNORMAL HIGH (ref 0.0–13.0)
NEUT#: 1.5 10*3/uL (ref 1.5–6.5)
RBC: 4.17 10*6/uL (ref 3.70–5.32)
RDW: 12.9 % (ref 11.3–14.5)
WBC: 2.8 10*3/uL — ABNORMAL LOW (ref 3.9–10.0)

## 2007-08-17 LAB — KAPPA/LAMBDA LIGHT CHAINS
Kappa free light chain: 1.14 mg/dL (ref 0.33–1.94)
Lambda Free Lght Chn: 2.49 mg/dL (ref 0.57–2.63)

## 2007-08-17 LAB — IGG, IGA, IGM
IgA: 8 mg/dL — ABNORMAL LOW (ref 68–378)
IgG (Immunoglobin G), Serum: 497 mg/dL — ABNORMAL LOW (ref 694–1618)
IgM, Serum: 51 mg/dL — ABNORMAL LOW (ref 60–263)

## 2007-08-17 LAB — BASIC METABOLIC PANEL
Calcium: 8.6 mg/dL (ref 8.4–10.5)
Glucose, Bld: 98 mg/dL (ref 70–99)
Potassium: 4 mEq/L (ref 3.5–5.3)
Sodium: 141 mEq/L (ref 135–145)

## 2007-11-09 ENCOUNTER — Ambulatory Visit: Payer: Self-pay | Admitting: Oncology

## 2007-11-14 LAB — CBC WITH DIFFERENTIAL/PLATELET
BASO%: 0.8 % (ref 0.0–2.0)
Basophils Absolute: 0 10*3/uL (ref 0.0–0.1)
EOS%: 2.1 % (ref 0.0–7.0)
HGB: 14 g/dL (ref 11.6–15.9)
MCH: 32.6 pg (ref 26.0–34.0)
MCHC: 35.4 g/dL (ref 32.0–36.0)
MCV: 92 fL (ref 81.0–101.0)
MONO%: 9.9 % (ref 0.0–13.0)
RBC: 4.3 10*6/uL (ref 3.70–5.32)
RDW: 12.8 % (ref 11.3–14.5)
lymph#: 1.2 10*3/uL (ref 0.9–3.3)

## 2007-11-14 LAB — BASIC METABOLIC PANEL
BUN: 27 mg/dL — ABNORMAL HIGH (ref 6–23)
Chloride: 106 mEq/L (ref 96–112)
Potassium: 3.9 mEq/L (ref 3.5–5.3)
Sodium: 141 mEq/L (ref 135–145)

## 2007-11-17 LAB — MAGNESIUM: Magnesium: 2.4 mg/dL (ref 1.5–2.5)

## 2007-12-04 DIAGNOSIS — J029 Acute pharyngitis, unspecified: Secondary | ICD-10-CM | POA: Insufficient documentation

## 2007-12-08 ENCOUNTER — Ambulatory Visit: Payer: Self-pay | Admitting: Family Medicine

## 2007-12-08 LAB — CONVERTED CEMR LAB: Rapid Strep: NEGATIVE

## 2007-12-14 ENCOUNTER — Telehealth: Payer: Self-pay | Admitting: Family Medicine

## 2007-12-22 ENCOUNTER — Telehealth: Payer: Self-pay | Admitting: Family Medicine

## 2008-03-07 ENCOUNTER — Ambulatory Visit: Payer: Self-pay | Admitting: Oncology

## 2008-03-14 LAB — CBC WITH DIFFERENTIAL/PLATELET
Basophils Absolute: 0 10*3/uL (ref 0.0–0.1)
Eosinophils Absolute: 0.1 10*3/uL (ref 0.0–0.5)
HCT: 38.2 % (ref 34.8–46.6)
HGB: 13.6 g/dL (ref 11.6–15.9)
LYMPH%: 21.8 % (ref 14.0–48.0)
MCV: 96.3 fL (ref 81.0–101.0)
MONO%: 9.1 % (ref 0.0–13.0)
NEUT#: 3.7 10*3/uL (ref 1.5–6.5)
NEUT%: 67.7 % (ref 39.6–76.8)
Platelets: 211 10*3/uL (ref 145–400)

## 2008-03-15 LAB — IGG, IGA, IGM
IgA: 18 mg/dL — ABNORMAL LOW (ref 68–378)
IgG (Immunoglobin G), Serum: 631 mg/dL — ABNORMAL LOW (ref 694–1618)
IgM, Serum: 35 mg/dL — ABNORMAL LOW (ref 60–263)

## 2008-03-15 LAB — BASIC METABOLIC PANEL
BUN: 31 mg/dL — ABNORMAL HIGH (ref 6–23)
Glucose, Bld: 89 mg/dL (ref 70–99)
Potassium: 4 mEq/L (ref 3.5–5.3)

## 2008-03-15 LAB — KAPPA/LAMBDA LIGHT CHAINS
Kappa free light chain: 1.3 mg/dL (ref 0.33–1.94)
Kappa:Lambda Ratio: 0.87 (ref 0.26–1.65)
Lambda Free Lght Chn: 1.5 mg/dL (ref 0.57–2.63)

## 2008-05-01 ENCOUNTER — Ambulatory Visit: Payer: Self-pay | Admitting: Oncology

## 2008-06-12 ENCOUNTER — Ambulatory Visit: Payer: Self-pay | Admitting: Oncology

## 2008-06-18 LAB — BASIC METABOLIC PANEL
BUN: 29 mg/dL — ABNORMAL HIGH (ref 6–23)
Calcium: 9.8 mg/dL (ref 8.4–10.5)
Glucose, Bld: 74 mg/dL (ref 70–99)

## 2008-06-18 LAB — IGG, IGA, IGM: IgG (Immunoglobin G), Serum: 731 mg/dL (ref 694–1618)

## 2008-08-08 ENCOUNTER — Ambulatory Visit: Payer: Self-pay | Admitting: Oncology

## 2008-08-12 LAB — CBC WITH DIFFERENTIAL/PLATELET
BASO%: 0.4 % (ref 0.0–2.0)
EOS%: 0.9 % (ref 0.0–7.0)
LYMPH%: 26 % (ref 14.0–49.7)
MCHC: 35.1 g/dL (ref 31.5–36.0)
MONO#: 0.4 10*3/uL (ref 0.1–0.9)
Platelets: 164 10*3/uL (ref 145–400)
RBC: 4.13 10*6/uL (ref 3.70–5.45)
WBC: 5.3 10*3/uL (ref 3.9–10.3)

## 2008-08-12 LAB — COMPREHENSIVE METABOLIC PANEL
ALT: 15 U/L (ref 0–35)
AST: 24 U/L (ref 0–37)
Alkaline Phosphatase: 55 U/L (ref 39–117)
CO2: 27 mEq/L (ref 19–32)
Sodium: 140 mEq/L (ref 135–145)
Total Bilirubin: 1 mg/dL (ref 0.3–1.2)
Total Protein: 6.8 g/dL (ref 6.0–8.3)

## 2008-08-14 LAB — KAPPA/LAMBDA LIGHT CHAINS
Kappa free light chain: <0.6 mg/dL (ref 0.33–1.94)
Lambda Free Lght Chn: 1.03 mg/dL (ref 0.57–2.63)

## 2008-10-08 ENCOUNTER — Ambulatory Visit: Payer: Self-pay | Admitting: Family Medicine

## 2008-10-08 DIAGNOSIS — J0110 Acute frontal sinusitis, unspecified: Secondary | ICD-10-CM | POA: Insufficient documentation

## 2008-10-18 ENCOUNTER — Ambulatory Visit: Payer: Self-pay | Admitting: Oncology

## 2008-10-30 ENCOUNTER — Ambulatory Visit: Payer: Self-pay | Admitting: Family Medicine

## 2008-10-30 LAB — CONVERTED CEMR LAB
ALT: 16 U/L
AST: 22 U/L
Albumin: 4 g/dL
Alkaline Phosphatase: 60 U/L
BUN: 29 mg/dL — ABNORMAL HIGH
Basophils Absolute: 0 10*3/uL
Basophils Relative: 0.6 %
Bilirubin Urine: NEGATIVE
Bilirubin, Direct: 0.2 mg/dL
CO2: 28 meq/L
Calcium: 9.4 mg/dL
Chloride: 109 meq/L
Cholesterol: 195 mg/dL
Creatinine, Ser: 1.4 mg/dL — ABNORMAL HIGH
Eosinophils Absolute: 0.1 10*3/uL
Eosinophils Relative: 2.5 %
GFR calc non Af Amer: 42.39 mL/min
Glucose, Bld: 79 mg/dL
HCT: 39.7 %
HDL: 76.9 mg/dL
Hemoglobin: 14 g/dL
Ketones, ur: NEGATIVE mg/dL
LDL Cholesterol: 108 mg/dL — ABNORMAL HIGH
Leukocytes, UA: NEGATIVE
Lymphocytes Relative: 29.4 %
Lymphs Abs: 1 10*3/uL
MCHC: 35.2 g/dL
MCV: 97.6 fL
Monocytes Absolute: 0.4 10*3/uL
Monocytes Relative: 10.4 %
Neutro Abs: 2 10*3/uL
Neutrophils Relative %: 57.1 %
Nitrite: NEGATIVE
Platelets: 161 10*3/uL
Potassium: 4.1 meq/L
RBC: 4.06 M/uL
RDW: 12.7 %
Sodium: 141 meq/L
Specific Gravity, Urine: 1.015
TSH: 2.31 u[IU]/mL
Total Bilirubin: 0.9 mg/dL
Total CHOL/HDL Ratio: 3
Total Protein: 6.3 g/dL
Triglycerides: 51 mg/dL
Urine Glucose: NEGATIVE mg/dL
Urobilinogen, UA: 0.2
VLDL: 10.2 mg/dL
WBC: 3.5 10*3/uL — ABNORMAL LOW
pH: 6

## 2008-11-05 ENCOUNTER — Encounter: Payer: Self-pay | Admitting: Family Medicine

## 2008-11-06 ENCOUNTER — Encounter: Payer: Self-pay | Admitting: Family Medicine

## 2008-11-06 ENCOUNTER — Other Ambulatory Visit: Admission: RE | Admit: 2008-11-06 | Discharge: 2008-11-06 | Payer: Self-pay | Admitting: Family Medicine

## 2008-11-06 ENCOUNTER — Ambulatory Visit: Payer: Self-pay | Admitting: Family Medicine

## 2008-11-06 DIAGNOSIS — I10 Essential (primary) hypertension: Secondary | ICD-10-CM | POA: Insufficient documentation

## 2008-11-06 LAB — HM PAP SMEAR

## 2008-12-06 ENCOUNTER — Ambulatory Visit: Payer: Self-pay | Admitting: Internal Medicine

## 2008-12-06 ENCOUNTER — Encounter: Admission: RE | Admit: 2008-12-06 | Discharge: 2008-12-06 | Payer: Self-pay | Admitting: Family Medicine

## 2008-12-06 LAB — HM MAMMOGRAPHY

## 2008-12-23 ENCOUNTER — Telehealth: Payer: Self-pay | Admitting: *Deleted

## 2008-12-24 ENCOUNTER — Ambulatory Visit: Payer: Self-pay | Admitting: Family Medicine

## 2008-12-24 DIAGNOSIS — M810 Age-related osteoporosis without current pathological fracture: Secondary | ICD-10-CM | POA: Insufficient documentation

## 2008-12-31 ENCOUNTER — Telehealth: Payer: Self-pay | Admitting: Family Medicine

## 2009-01-07 ENCOUNTER — Ambulatory Visit: Payer: Self-pay | Admitting: Oncology

## 2009-01-10 LAB — CBC WITH DIFFERENTIAL/PLATELET
BASO%: 0.8 % (ref 0.0–2.0)
LYMPH%: 35.9 % (ref 14.0–49.7)
MCHC: 35 g/dL (ref 31.5–36.0)
MONO#: 0.3 10*3/uL (ref 0.1–0.9)
RBC: 4.17 10*6/uL (ref 3.70–5.45)
RDW: 12.8 % (ref 11.2–14.5)
WBC: 3.3 10*3/uL — ABNORMAL LOW (ref 3.9–10.3)
lymph#: 1.2 10*3/uL (ref 0.9–3.3)

## 2009-01-14 LAB — SPEP & IFE WITH QIG
Alpha-2-Globulin: 9.7 % (ref 7.1–11.8)
Gamma Globulin: 9.3 % — ABNORMAL LOW (ref 11.1–18.8)
IgG (Immunoglobin G), Serum: 687 mg/dL — ABNORMAL LOW (ref 694–1618)
IgM, Serum: 32 mg/dL — ABNORMAL LOW (ref 60–263)

## 2009-01-14 LAB — COMPREHENSIVE METABOLIC PANEL
ALT: 19 U/L (ref 0–35)
CO2: 21 mEq/L (ref 19–32)
Chloride: 108 mEq/L (ref 96–112)
Sodium: 141 mEq/L (ref 135–145)
Total Bilirubin: 0.7 mg/dL (ref 0.3–1.2)
Total Protein: 6.4 g/dL (ref 6.0–8.3)

## 2009-01-14 LAB — KAPPA/LAMBDA LIGHT CHAINS: Kappa free light chain: 0.83 mg/dL (ref 0.33–1.94)

## 2009-03-06 ENCOUNTER — Telehealth: Payer: Self-pay | Admitting: Family Medicine

## 2009-03-20 ENCOUNTER — Telehealth: Payer: Self-pay | Admitting: Family Medicine

## 2009-04-30 ENCOUNTER — Ambulatory Visit: Payer: Self-pay | Admitting: Oncology

## 2009-05-02 LAB — CBC WITH DIFFERENTIAL/PLATELET
Basophils Absolute: 0 10*3/uL (ref 0.0–0.1)
EOS%: 2 % (ref 0.0–7.0)
Eosinophils Absolute: 0.1 10*3/uL (ref 0.0–0.5)
HGB: 14.3 g/dL (ref 11.6–15.9)
NEUT#: 1.9 10*3/uL (ref 1.5–6.5)
RBC: 4.16 10*6/uL (ref 3.70–5.45)
RDW: 12.6 % (ref 11.2–14.5)
WBC: 3.4 10*3/uL — ABNORMAL LOW (ref 3.9–10.3)
lymph#: 1.1 10*3/uL (ref 0.9–3.3)

## 2009-05-06 LAB — COMPREHENSIVE METABOLIC PANEL
AST: 19 U/L (ref 0–37)
Albumin: 4.6 g/dL (ref 3.5–5.2)
Alkaline Phosphatase: 59 U/L (ref 39–117)
BUN: 23 mg/dL (ref 6–23)
Calcium: 9.3 mg/dL (ref 8.4–10.5)
Chloride: 104 mEq/L (ref 96–112)
Glucose, Bld: 63 mg/dL — ABNORMAL LOW (ref 70–99)
Potassium: 4 mEq/L (ref 3.5–5.3)
Sodium: 141 mEq/L (ref 135–145)
Total Protein: 6.7 g/dL (ref 6.0–8.3)

## 2009-05-06 LAB — IMMUNOFIXATION ELECTROPHORESIS
IgA: 43 mg/dL — ABNORMAL LOW (ref 68–378)
IgG (Immunoglobin G), Serum: 693 mg/dL — ABNORMAL LOW (ref 694–1618)
IgM, Serum: 33 mg/dL — ABNORMAL LOW (ref 60–263)

## 2009-07-17 ENCOUNTER — Ambulatory Visit: Payer: Self-pay | Admitting: Oncology

## 2009-08-29 ENCOUNTER — Ambulatory Visit: Payer: Self-pay | Admitting: Oncology

## 2009-09-02 LAB — CBC WITH DIFFERENTIAL/PLATELET
BASO%: 0.4 % (ref 0.0–2.0)
EOS%: 1 % (ref 0.0–7.0)
HCT: 42.5 % (ref 34.8–46.6)
HGB: 14.9 g/dL (ref 11.6–15.9)
MCH: 34.8 pg — ABNORMAL HIGH (ref 25.1–34.0)
MCHC: 35.1 g/dL (ref 31.5–36.0)
MONO#: 0.4 10*3/uL (ref 0.1–0.9)
RDW: 13.3 % (ref 11.2–14.5)
WBC: 4.7 10*3/uL (ref 3.9–10.3)
lymph#: 1.1 10*3/uL (ref 0.9–3.3)

## 2009-09-03 LAB — BASIC METABOLIC PANEL
CO2: 24 mEq/L (ref 19–32)
Chloride: 103 mEq/L (ref 96–112)
Potassium: 4.3 mEq/L (ref 3.5–5.3)

## 2009-09-03 LAB — KAPPA/LAMBDA LIGHT CHAINS
Kappa:Lambda Ratio: 0.97 (ref 0.26–1.65)
Lambda Free Lght Chn: 0.92 mg/dL (ref 0.57–2.63)

## 2009-09-22 ENCOUNTER — Ambulatory Visit (HOSPITAL_COMMUNITY): Admission: RE | Admit: 2009-09-22 | Discharge: 2009-09-22 | Payer: Self-pay | Admitting: Oncology

## 2009-09-25 ENCOUNTER — Telehealth: Payer: Self-pay | Admitting: Family Medicine

## 2009-12-18 ENCOUNTER — Ambulatory Visit: Payer: Self-pay | Admitting: Oncology

## 2009-12-22 LAB — BASIC METABOLIC PANEL
CO2: 30 mEq/L (ref 19–32)
Chloride: 103 mEq/L (ref 96–112)
Creatinine, Ser: 1.43 mg/dL — ABNORMAL HIGH (ref 0.40–1.20)
Sodium: 138 mEq/L (ref 135–145)

## 2009-12-22 LAB — CBC WITH DIFFERENTIAL/PLATELET
BASO%: 0.4 % (ref 0.0–2.0)
EOS%: 0.9 % (ref 0.0–7.0)
MCH: 34.7 pg — ABNORMAL HIGH (ref 25.1–34.0)
MCHC: 35.1 g/dL (ref 31.5–36.0)
MONO#: 0.6 10*3/uL (ref 0.1–0.9)
NEUT%: 67.1 % (ref 38.4–76.8)
RBC: 4.07 10*6/uL (ref 3.70–5.45)
RDW: 12.9 % (ref 11.2–14.5)
WBC: 6.6 10*3/uL (ref 3.9–10.3)
lymph#: 1.5 10*3/uL (ref 0.9–3.3)

## 2009-12-23 LAB — KAPPA/LAMBDA LIGHT CHAINS
Kappa:Lambda Ratio: 0.86 (ref 0.26–1.65)
Lambda Free Lght Chn: 0.88 mg/dL (ref 0.57–2.63)

## 2010-04-21 ENCOUNTER — Ambulatory Visit: Payer: Self-pay | Admitting: Oncology

## 2010-04-23 ENCOUNTER — Encounter: Payer: Self-pay | Admitting: Family Medicine

## 2010-04-23 LAB — BASIC METABOLIC PANEL
BUN: 29 mg/dL — ABNORMAL HIGH (ref 6–23)
CO2: 27 mEq/L (ref 19–32)
Chloride: 102 mEq/L (ref 96–112)
Glucose, Bld: 107 mg/dL — ABNORMAL HIGH (ref 70–99)
Potassium: 4.3 mEq/L (ref 3.5–5.3)

## 2010-04-23 LAB — CBC WITH DIFFERENTIAL/PLATELET
Basophils Absolute: 0 10*3/uL (ref 0.0–0.1)
EOS%: 0.9 % (ref 0.0–7.0)
Eosinophils Absolute: 0 10*3/uL (ref 0.0–0.5)
HCT: 42.2 % (ref 34.8–46.6)
HGB: 14.8 g/dL (ref 11.6–15.9)
MONO#: 0.6 10*3/uL (ref 0.1–0.9)
NEUT#: 3.3 10*3/uL (ref 1.5–6.5)
RDW: 13 % (ref 11.2–14.5)
WBC: 4.9 10*3/uL (ref 3.9–10.3)
lymph#: 1 10*3/uL (ref 0.9–3.3)

## 2010-04-24 LAB — KAPPA/LAMBDA LIGHT CHAINS: Lambda Free Lght Chn: 0.6 mg/dL (ref 0.57–2.63)

## 2010-07-03 ENCOUNTER — Telehealth: Payer: Self-pay | Admitting: Family Medicine

## 2010-07-05 ENCOUNTER — Encounter: Payer: Self-pay | Admitting: Oncology

## 2010-07-16 NOTE — Progress Notes (Signed)
       New/Updated Medications: PREMARIN 0.625 MG/GM CREA (ESTROGENS, CONJUGATED) apply 2 x week .Marland Kitchen...no applicator Prescriptions: PREMARIN 0.625 MG/GM CREA (ESTROGENS, CONJUGATED) apply 2 x week .Marland Kitchen...no applicator  #3 tubes x 4   Entered and Authorized by:   Roderick Pee MD   Signed by:   Roderick Pee MD on 09/25/2009   Method used:   Print then Give to Patient   RxID:   [id number]

## 2010-07-16 NOTE — Letter (Signed)
Summary: [facility]  [facility]   Imported By: Maryln Gottron 05/06/2010 10:23:47  _____________________________________________________________________  External Attachment:    Type:   Image     Comment:   External Document

## 2010-07-16 NOTE — Progress Notes (Signed)
Summary: med refill  Phone Note Call from Patient   Caller: Patient Call For: Cristina Pee MD Summary of Call: pt needs refill on lisionpril 5 mg [facility], pt has ov sch for 08-27-2010 Initial call taken by: Heron Sabins,  July 03, 2010 8:35 AM    Prescriptions: ZESTRIL 5 MG TABS (LISINOPRIL) Take 1 tablet by mouth every morning  #100 Tablet x 1   Entered by:   Kern Reap CMA (AAMA)   Authorized by:   Cristina Pee MD   Signed by:   Kern Reap CMA (AAMA) on 07/03/2010   Method used:   [facility]        [facility] [phone]* (retail)       [address]       [place], [place]  [address]       Ph: [phone] or [phone]       Fax: [phone]   RxID:   [id number]

## 2010-07-26 ENCOUNTER — Other Ambulatory Visit: Payer: Self-pay | Admitting: Family Medicine

## 2010-07-28 ENCOUNTER — Telehealth: Payer: Self-pay | Admitting: Family Medicine

## 2010-07-28 MED ORDER — ALENDRONATE SODIUM 70 MG PO TABS: 70.0000 mg | ORAL_TABLET | ORAL | Status: DC

## 2010-07-28 NOTE — Telephone Encounter (Signed)
[facility].

## 2010-07-29 ENCOUNTER — Other Ambulatory Visit: Payer: Self-pay | Admitting: *Deleted

## 2010-07-29 MED ORDER — ALENDRONATE SODIUM 70 MG PO TABS: 70.0000 mg | ORAL_TABLET | ORAL | Status: DC

## 2010-08-13 ENCOUNTER — Other Ambulatory Visit: Payer: Self-pay | Admitting: Family Medicine

## 2010-08-13 ENCOUNTER — Other Ambulatory Visit: Payer: Self-pay

## 2010-08-13 ENCOUNTER — Encounter (INDEPENDENT_AMBULATORY_CARE_PROVIDER_SITE_OTHER): Payer: Self-pay | Admitting: *Deleted

## 2010-08-13 DIAGNOSIS — Z0000 Encounter for general adult medical examination without abnormal findings: Secondary | ICD-10-CM

## 2010-08-13 DIAGNOSIS — E785 Hyperlipidemia, unspecified: Secondary | ICD-10-CM

## 2010-08-13 LAB — HEPATIC FUNCTION PANEL
AST: 19 U/L (ref 0–37)
Alkaline Phosphatase: 41 U/L (ref 39–117)
Total Bilirubin: 1.3 mg/dL — ABNORMAL HIGH (ref 0.3–1.2)

## 2010-08-13 LAB — BASIC METABOLIC PANEL
GFR: 49.33 mL/min — ABNORMAL LOW (ref >60.00)
Glucose, Bld: 66 mg/dL — ABNORMAL LOW (ref 70–99)
Potassium: 4.2 mEq/L (ref 3.5–5.1)
Sodium: 136 mEq/L (ref 135–145)

## 2010-08-13 LAB — URINALYSIS
Ketones, ur: NEGATIVE
Specific Gravity, Urine: <=1.005 (ref 1.000–1.030)
Total Protein, Urine: NEGATIVE
Urine Glucose: NEGATIVE
pH: 6 (ref 5.0–8.0)

## 2010-08-13 LAB — CBC WITH DIFFERENTIAL/PLATELET
Basophils Relative: 0.3 % (ref 0.0–3.0)
Eosinophils Absolute: 0.1 10*3/uL (ref 0.0–0.7)
HCT: 44.2 % (ref 36.0–46.0)
Hemoglobin: 15.4 g/dL — ABNORMAL HIGH (ref 12.0–15.0)
Lymphocytes Relative: 33.2 % (ref 12.0–46.0)
Lymphs Abs: 1.5 10*3/uL (ref 0.7–4.0)
MCHC: 34.8 g/dL (ref 30.0–36.0)
Neutro Abs: 2.5 10*3/uL (ref 1.4–7.7)
RBC: 4.47 Mil/uL (ref 3.87–5.11)

## 2010-08-13 LAB — LIPID PANEL
HDL: 91.9 mg/dL (ref >39.00)
VLDL: 10 mg/dL (ref 0.0–40.0)

## 2010-08-19 ENCOUNTER — Other Ambulatory Visit: Payer: Self-pay

## 2010-08-26 ENCOUNTER — Encounter: Payer: Self-pay | Admitting: Family Medicine

## 2010-08-27 ENCOUNTER — Ambulatory Visit (INDEPENDENT_AMBULATORY_CARE_PROVIDER_SITE_OTHER): Payer: 59 | Admitting: Family Medicine

## 2010-08-27 ENCOUNTER — Other Ambulatory Visit (HOSPITAL_COMMUNITY)
Admission: RE | Admit: 2010-08-27 | Discharge: 2010-08-27 | Disposition: A | Discharge status: Home / Self Care | Payer: 59 | Source: Ambulatory Visit | Attending: Family Medicine | Admitting: Family Medicine

## 2010-08-27 ENCOUNTER — Encounter: Payer: Self-pay | Admitting: Family Medicine

## 2010-08-27 DIAGNOSIS — Z0000 Encounter for general adult medical examination without abnormal findings: Secondary | ICD-10-CM

## 2010-08-27 DIAGNOSIS — M810 Age-related osteoporosis without current pathological fracture: Secondary | ICD-10-CM

## 2010-08-27 DIAGNOSIS — Z124 Encounter for screening for malignant neoplasm of cervix: Secondary | ICD-10-CM

## 2010-08-27 DIAGNOSIS — J309 Allergic rhinitis, unspecified: Secondary | ICD-10-CM

## 2010-08-27 DIAGNOSIS — N952 Postmenopausal atrophic vaginitis: Secondary | ICD-10-CM

## 2010-08-27 DIAGNOSIS — IMO0002 Reserved for concepts with insufficient information to code with codable children: Secondary | ICD-10-CM

## 2010-08-27 DIAGNOSIS — I10 Essential (primary) hypertension: Secondary | ICD-10-CM

## 2010-08-27 DIAGNOSIS — C9001 Multiple myeloma in remission: Secondary | ICD-10-CM

## 2010-08-27 DIAGNOSIS — Z01419 Encounter for gynecological examination (general) (routine) without abnormal findings: Secondary | ICD-10-CM | POA: Insufficient documentation

## 2010-08-27 MED ORDER — ESTROGENS, CONJUGATED 0.625 MG/GM VA CREA: TOPICAL_CREAM | VAGINAL | Status: DC

## 2010-08-27 MED ORDER — ALENDRONATE SODIUM 70 MG PO TABS: 70.0000 mg | ORAL_TABLET | ORAL | Status: AC

## 2010-08-27 MED ORDER — LISINOPRIL 5 MG PO TABS: 5.0000 mg | ORAL_TABLET | Freq: Every day | ORAL | Status: DC

## 2010-08-27 NOTE — Progress Notes (Signed)
  Subjective:    Patient ID: Cristina Garcia, female    DOB: 1960-10-27, 50 y.o.   MRN: [id number]  HPI Cristina Garcia is a 50 year old, married female, nonsmoker, who comes in today for general physical examination because of a history of hypertension, multiple myeloma, osteoporosis.  As noted in her previous note.  She was seen in 07 for general physical exam at that time had abnormal labs.  Workup showed multiple myeloma.  She subsequently had a bone marrow transplant in 2008 has done well.  No recurrence.She sees Dr. Inetta Fermo. Annually for checkup.  She takes Fosamax 70 mg weekly, because of history of osteoporosis.  We discussed a 3-year treatment program and then coming off the medication.  She also has mild hypertension for which he takes Zestril 5 mg daily BP 102/64   Review of Systems  Constitutional: Negative.   HENT: Negative.   Eyes: Negative.   Respiratory: Negative.   Cardiovascular: Negative.   Gastrointestinal: Negative.   Genitourinary: Negative.   Musculoskeletal: Negative.   Neurological: Negative.   Hematological: Negative.   Psychiatric/Behavioral: Negative.        Objective:   Physical Exam  Constitutional: She appears well-developed and well-nourished.  HENT:  Head: Normocephalic and atraumatic.  Right Ear: External ear normal.  Left Ear: External ear normal.  Nose: Nose normal.  Mouth/Throat: Oropharynx is clear and moist.  Eyes: EOM are normal. Pupils are equal, round, and reactive to light.  Neck: Normal range of motion. Neck supple. No thyromegaly present.  Cardiovascular: Normal rate, regular rhythm, normal heart sounds and intact distal pulses.  Exam reveals no gallop and no friction rub.   No murmur heard. Pulmonary/Chest: Effort normal and breath sounds normal.  Abdominal: Soft. Bowel sounds are normal. She exhibits no distension and no mass. There is no tenderness. There is no rebound.  Genitourinary: Vagina normal and uterus normal. Guaiac negative  stool. No vaginal discharge found.       Breast exam normal.  She has a history of bilateral implants.  Mammogram due  Musculoskeletal: Normal range of motion.  Lymphadenopathy:    She has no cervical adenopathy.  Neurological: She is alert. She has normal reflexes. No cranial nerve deficit. She exhibits normal muscle tone. Coordination normal.  Skin: Skin is warm and dry.       Total body skin exam normal  Psychiatric: She has a normal mood and affect. Her behavior is normal. Judgment and thought content normal.          Assessment & Plan:  History of multiple myeloma.  Successful BMT 2008 follow-up yearly with oncology.  History of osteoporosis.  Continue calcium vitamin D, and Fosamax discussed 3-year, max on the Fosamax.  Mild hypertension.  Continue Zestril 5 daily.  Follow-up in one year

## 2010-08-27 NOTE — Patient Instructions (Signed)
Continue current medications follow-up in one year or sooner if any problems 

## 2010-09-01 ENCOUNTER — Other Ambulatory Visit: Payer: Self-pay | Admitting: Oncology

## 2010-09-01 ENCOUNTER — Encounter (HOSPITAL_BASED_OUTPATIENT_CLINIC_OR_DEPARTMENT_OTHER): Payer: 59 | Admitting: Oncology

## 2010-09-01 DIAGNOSIS — M899 Disorder of bone, unspecified: Secondary | ICD-10-CM

## 2010-09-01 DIAGNOSIS — C9000 Multiple myeloma not having achieved remission: Secondary | ICD-10-CM

## 2010-09-01 DIAGNOSIS — N289 Disorder of kidney and ureter, unspecified: Secondary | ICD-10-CM

## 2010-09-01 DIAGNOSIS — C9001 Multiple myeloma in remission: Secondary | ICD-10-CM

## 2010-09-01 DIAGNOSIS — E876 Hypokalemia: Secondary | ICD-10-CM

## 2010-09-01 LAB — CBC WITH DIFFERENTIAL/PLATELET
Basophils Absolute: 0 10*3/uL (ref 0.0–0.1)
EOS%: 1.1 % (ref 0.0–7.0)
HGB: 14.6 g/dL (ref 11.6–15.9)
MCH: 33.3 pg (ref 25.1–34.0)
MCHC: 35.3 g/dL (ref 31.5–36.0)
MCV: 94.5 fL (ref 79.5–101.0)
MONO%: 8.1 % (ref 0.0–14.0)
NEUT%: 55.8 % (ref 38.4–76.8)
RDW: 12.4 % (ref 11.2–14.5)

## 2010-09-02 LAB — BASIC METABOLIC PANEL
BUN: 21 mg/dL (ref 6–23)
Creatinine, Ser: 1.28 mg/dL — ABNORMAL HIGH (ref 0.40–1.20)
Potassium: 3.8 mEq/L (ref 3.5–5.3)

## 2010-09-02 LAB — KAPPA/LAMBDA LIGHT CHAINS
Kappa free light chain: 1.09 mg/dL (ref 0.33–1.94)
Lambda Free Lght Chn: 1.19 mg/dL (ref 0.57–2.63)

## 2010-11-27 ENCOUNTER — Other Ambulatory Visit: Payer: Self-pay | Admitting: Family Medicine

## 2010-11-27 MED ORDER — ACYCLOVIR 200 MG PO CAPS: ORAL_CAPSULE | ORAL | Status: DC

## 2010-11-27 NOTE — Telephone Encounter (Signed)
Pt called req refill of Acyclovir [facility] mg to [facility] [address]

## 2010-12-01 ENCOUNTER — Telehealth: Payer: Self-pay | Admitting: *Deleted

## 2010-12-01 NOTE — Telephone Encounter (Signed)
HAS BEEN HAVING COLD SX WITH COUGH AND EARACHE- DOES SHE NEED TO BE SEEN?

## 2010-12-02 ENCOUNTER — Ambulatory Visit (INDEPENDENT_AMBULATORY_CARE_PROVIDER_SITE_OTHER): Payer: 59 | Admitting: Family Medicine

## 2010-12-02 ENCOUNTER — Encounter: Payer: Self-pay | Admitting: Family Medicine

## 2010-12-02 VITALS — BP 112/74 | Temp 98.5°F | Wt 127.0 lb

## 2010-12-02 DIAGNOSIS — J309 Allergic rhinitis, unspecified: Secondary | ICD-10-CM

## 2010-12-02 MED ORDER — PREDNISONE 20 MG PO TABS: ORAL_TABLET | ORAL | Status: DC

## 2010-12-02 NOTE — Patient Instructions (Signed)
Take the prednisone as directed.  Return p.r.n. 

## 2010-12-02 NOTE — Progress Notes (Signed)
  Subjective:    Patient ID: Cristina Garcia, female    DOB: Jan 24, 1961, 50 y.o.   MRN: [id number]  HPI Cristina Garcia is a delightful, 50 year old, married female, nonsmoker, who comes in today for a flareup of her allergic rhinitis.  She had a viral infection.  A couple weeks ago.  The triggered this.  She has a lot of head congestion, postnasal drip, cough, no fever   Review of Systems General pulmonary is systems otherwise negative    Objective:   Physical Exam Logo punishing in no acute distress.  HEENT pertinent at the nasal septum is in the midline.  The 3+ nasal edema.  Neckl normal.  Lungs clear.  No wheezing       Assessment & Plan:  Allergic rhinitis flare branched, and taper prednisone return p.r.n.

## 2010-12-02 NOTE — Telephone Encounter (Signed)
Seen today in office

## 2010-12-18 ENCOUNTER — Telehealth: Payer: Self-pay | Admitting: *Deleted

## 2010-12-18 NOTE — Telephone Encounter (Signed)
Pt is having foot pain, and wants to talk to Fleet Contras about a possible Podiatrist referral?

## 2010-12-18 NOTE — Telephone Encounter (Signed)
patient  Is having foot pain okay for referral?

## 2010-12-21 NOTE — Telephone Encounter (Signed)
Please tell her to call Dr. Toni Arthurs at [facility],,,,,,,,,,, orthopedist sees a foot specialist

## 2010-12-21 NOTE — Telephone Encounter (Signed)
Left message on machine for patient

## 2010-12-22 ENCOUNTER — Telehealth: Payer: Self-pay | Admitting: *Deleted

## 2010-12-22 ENCOUNTER — Encounter: Payer: Self-pay | Admitting: *Deleted

## 2010-12-22 NOTE — Telephone Encounter (Signed)
Duplicate

## 2010-12-31 ENCOUNTER — Other Ambulatory Visit: Payer: Self-pay | Admitting: Oncology

## 2010-12-31 ENCOUNTER — Encounter (HOSPITAL_BASED_OUTPATIENT_CLINIC_OR_DEPARTMENT_OTHER): Payer: 59 | Admitting: Oncology

## 2010-12-31 DIAGNOSIS — M899 Disorder of bone, unspecified: Secondary | ICD-10-CM

## 2010-12-31 DIAGNOSIS — C9001 Multiple myeloma in remission: Secondary | ICD-10-CM

## 2010-12-31 DIAGNOSIS — E876 Hypokalemia: Secondary | ICD-10-CM

## 2010-12-31 DIAGNOSIS — N289 Disorder of kidney and ureter, unspecified: Secondary | ICD-10-CM

## 2010-12-31 LAB — BASIC METABOLIC PANEL
CO2: 25 mEq/L (ref 19–32)
Calcium: 10.5 mg/dL (ref 8.4–10.5)
Chloride: 102 mEq/L (ref 96–112)
Glucose, Bld: 131 mg/dL — ABNORMAL HIGH (ref 70–99)
Sodium: 137 mEq/L (ref 135–145)

## 2010-12-31 LAB — CBC WITH DIFFERENTIAL/PLATELET
Basophils Absolute: 0 10*3/uL (ref 0.0–0.1)
Eosinophils Absolute: 0.1 10*3/uL (ref 0.0–0.5)
HGB: 14.7 g/dL (ref 11.6–15.9)
LYMPH%: 26.5 % (ref 14.0–49.7)
MCV: 95.7 fL (ref 79.5–101.0)
MONO#: 0.5 10*3/uL (ref 0.1–0.9)
MONO%: 9.7 % (ref 0.0–14.0)
NEUT#: 2.9 10*3/uL (ref 1.5–6.5)
Platelets: 163 10*3/uL (ref 145–400)
RDW: 13.5 % (ref 11.2–14.5)
WBC: 4.7 10*3/uL (ref 3.9–10.3)

## 2011-01-01 ENCOUNTER — Other Ambulatory Visit: Payer: Self-pay | Admitting: Oncology

## 2011-01-01 DIAGNOSIS — C9000 Multiple myeloma not having achieved remission: Secondary | ICD-10-CM

## 2011-01-04 LAB — KAPPA/LAMBDA LIGHT CHAINS
Kappa free light chain: 1.11 mg/dL (ref 0.33–1.94)
Kappa:Lambda Ratio: 1.91 — ABNORMAL HIGH (ref 0.26–1.65)
Lambda Free Lght Chn: 0.58 mg/dL (ref 0.57–2.63)

## 2011-01-04 LAB — PROTEIN ELECTROPHORESIS, SERUM
Albumin ELP: 67.8 % — ABNORMAL HIGH (ref 55.8–66.1)
Total Protein, Serum Electrophoresis: 6.5 g/dL (ref 6.0–8.3)

## 2011-01-20 ENCOUNTER — Ambulatory Visit (HOSPITAL_COMMUNITY)
Admission: RE | Admit: 2011-01-20 | Discharge: 2011-01-20 | Disposition: A | Discharge status: Home / Self Care | Payer: 59 | Source: Ambulatory Visit | Attending: Oncology | Admitting: Oncology

## 2011-01-20 DIAGNOSIS — C9000 Multiple myeloma not having achieved remission: Secondary | ICD-10-CM | POA: Insufficient documentation

## 2011-01-27 ENCOUNTER — Other Ambulatory Visit: Payer: Self-pay | Admitting: Family Medicine

## 2011-01-27 ENCOUNTER — Other Ambulatory Visit (HOSPITAL_COMMUNITY): Payer: 59

## 2011-04-15 ENCOUNTER — Other Ambulatory Visit: Payer: Self-pay | Admitting: Family Medicine

## 2011-04-17 ENCOUNTER — Telehealth: Payer: Self-pay | Admitting: Oncology

## 2011-04-17 NOTE — Telephone Encounter (Signed)
lmonvm advising the pt that her appts for nov 19th has been cancelled due to the new epic conversion and we had to r/s the appts for dec. lmonvm with the new d/t of the appts.

## 2011-05-07 ENCOUNTER — Ambulatory Visit (INDEPENDENT_AMBULATORY_CARE_PROVIDER_SITE_OTHER): Payer: 59 | Admitting: Family Medicine

## 2011-05-07 ENCOUNTER — Encounter: Payer: Self-pay | Admitting: Family Medicine

## 2011-05-07 DIAGNOSIS — J029 Acute pharyngitis, unspecified: Secondary | ICD-10-CM

## 2011-05-07 DIAGNOSIS — R52 Pain, unspecified: Secondary | ICD-10-CM

## 2011-05-07 DIAGNOSIS — R509 Fever, unspecified: Secondary | ICD-10-CM

## 2011-05-07 LAB — POCT RAPID STREP A (OFFICE): Rapid Strep A Screen: NEGATIVE

## 2011-05-07 LAB — POCT INFLUENZA A/B
Influenza A, POC: NEGATIVE
Influenza B, POC: NEGATIVE

## 2011-05-07 NOTE — Progress Notes (Signed)
  Subjective:    Patient ID: Cristina Garcia, female    DOB: 11-29-1960, 50 y.o.   MRN: [id number]  HPI  Acute visit. Acute onset yesterday of chills, fever, bilateral earache, sore throat, cough, body aches. Denies any vomiting or diarrhea. Minimal headache. No skin rash. No sick exposures. No history of flu vaccine. Patient is nonsmoker.  No dysuria and no abdominal pain.  Review of Systems  Constitutional: Positive for fever and chills. Negative for unexpected weight change.  HENT: Positive for ear pain, sore throat and voice change. Negative for trouble swallowing.   Respiratory: Positive for cough. Negative for shortness of breath and wheezing.   Gastrointestinal: Negative for abdominal pain.  Genitourinary: Negative for dysuria.  Neurological: Positive for headaches. Negative for dizziness and weakness.       Objective:   Physical Exam  Constitutional: She is oriented to person, place, and time. She appears well-developed and well-nourished. No distress.       Minimal posterior pharynx erythema  HENT:  Right Ear: External ear normal.  Left Ear: External ear normal.  Neck: Neck supple.  Cardiovascular: Normal rate and regular rhythm.   Pulmonary/Chest: Effort normal and breath sounds normal. No respiratory distress. She has no wheezes. She has no rales.  Musculoskeletal: She exhibits no edema.  Lymphadenopathy:    She has no cervical adenopathy.  Neurological: She is alert and oriented to person, place, and time.  Skin: No rash noted.          Assessment & Plan:  Febrile illness. Suspect viral. Rule out influenza. Less likely group A strep. Check rapid strep and influenza screen.  Rapid strep and influenza negative.

## 2011-05-07 NOTE — Patient Instructions (Signed)
Viral Syndrome You or your child has Viral Syndrome. It is the most common infection causing "colds" and infections in the nose, throat, sinuses, and breathing tubes. Sometimes the infection causes nausea, vomiting, or diarrhea. The germ that causes the infection is a virus. No antibiotic or other medicine will kill it. There are medicines that you can take to make you or your child more comfortable.  HOME CARE INSTRUCTIONS   Rest in bed until you start to feel better.   If you have diarrhea or vomiting, eat small amounts of crackers and toast. Soup is helpful.   Do not give aspirin or medicine that contains aspirin to children.   Only take over-the-counter or prescription medicines for pain, discomfort, or fever as directed by your caregiver.  SEEK IMMEDIATE MEDICAL CARE IF:   You or your child has not improved within one week.   You or your child has pain that is not at least partially relieved by over-the-counter medicine.   Thick, colored mucus or blood is coughed up.   Discharge from the nose becomes thick yellow or green.   Diarrhea or vomiting gets worse.   There is any major change in your or your child's condition.   You or your child develops a skin rash, stiff neck, severe headache, or are unable to hold down food or fluid.   You or your child has an oral temperature above 102 F (38.9 C), not controlled by medicine.   Your baby is older than 3 months with a rectal temperature of 102 F (38.9 C) or higher.   Your baby is 3 months old or younger with a rectal temperature of 100.4 F (38 C) or higher.  Document Released: 05/16/2006 Document Revised: 02/10/2011 Document Reviewed: 05/17/2007 ExitCare Patient Information 2012 [employer]. 

## 2011-06-09 ENCOUNTER — Other Ambulatory Visit: Payer: Self-pay | Admitting: *Deleted

## 2011-06-09 DIAGNOSIS — C9001 Multiple myeloma in remission: Secondary | ICD-10-CM

## 2011-06-10 ENCOUNTER — Ambulatory Visit (HOSPITAL_BASED_OUTPATIENT_CLINIC_OR_DEPARTMENT_OTHER): Payer: 59 | Admitting: Oncology

## 2011-06-10 ENCOUNTER — Telehealth: Payer: Self-pay | Admitting: Oncology

## 2011-06-10 ENCOUNTER — Other Ambulatory Visit (HOSPITAL_BASED_OUTPATIENT_CLINIC_OR_DEPARTMENT_OTHER): Payer: 59 | Admitting: Lab

## 2011-06-10 VITALS — BP 128/74 | HR 70 | Temp 97.9°F | Ht 69.0 in | Wt 128.4 lb

## 2011-06-10 DIAGNOSIS — C9001 Multiple myeloma in remission: Secondary | ICD-10-CM

## 2011-06-10 DIAGNOSIS — Z23 Encounter for immunization: Secondary | ICD-10-CM

## 2011-06-10 LAB — CBC WITH DIFFERENTIAL/PLATELET
BASO%: 0.5 % (ref 0.0–2.0)
Basophils Absolute: 0 10*3/uL (ref 0.0–0.1)
EOS%: 0.9 % (ref 0.0–7.0)
HGB: 15.7 g/dL (ref 11.6–15.9)
MCH: 34 pg (ref 25.1–34.0)
MCHC: 34.6 g/dL (ref 31.5–36.0)
MONO%: 7.2 % (ref 0.0–14.0)
RBC: 4.62 10*6/uL (ref 3.70–5.45)
RDW: 13.8 % (ref 11.2–14.5)
lymph#: 1.3 10*3/uL (ref 0.9–3.3)

## 2011-06-10 MED ORDER — INFLUENZA VIRUS VACC SPLIT PF IM SUSP
0.5000 mL | INTRAMUSCULAR | Status: AC
  Administered 2011-06-10: 0.5 mL via INTRAMUSCULAR
  Filled 2011-06-10: qty 0.5

## 2011-06-10 NOTE — Telephone Encounter (Signed)
Called Dr.Dora Juanda Chance office,appt for springtime not available yet , pt wants later appt due to new job. [facility] will call pt when calendar is available.

## 2011-06-10 NOTE — Progress Notes (Signed)
Reviewed and and OFFICE PROGRESS NOTE   INTERVAL HISTORY:   She returns as scheduled. She continues to have mild discomfort in the left pelvis with physical activity and lifting. This has not changed. She reports constipation. She has a chronic history of rectal bleeding with a firm stool. She relates this to hemorrhoids. She had a viral infection in November characterized by fever, myalgias, and a sore throat.  Objective:  Vital signs in last 24 hours:  Blood pressure 128/74, pulse 70, temperature 97.9 F (36.6 C), temperature source Oral, height 5\' 9"  (1.753 m), weight 128 lb 6.4 oz (58.242 kg).   Resp: Lungs clear bilaterally Cardio: Regular rate and rhythm GI: No hepatosplenomegaly Vascular: The left lower leg is slightly larger than the right side. No edema.     Lab Results:  Lab Results  Component Value Date   WBC 4.7 06/10/2011   HGB 15.7 06/10/2011   HCT 45.4 06/10/2011   MCV 98.3 06/10/2011   PLT 168 06/10/2011   ANC 3.0    Medications: I have reviewed the patient's current medications.  Assessment/Plan: 1. Multiple myeloma, status post high-dose chemotherapy with autologous stem cell support at [facility] in June 2008.  She remains in clinical remission. 2. Renal insufficiency secondary to multiple myeloma.  We obtained a repeat chemistry panel today. 3. History of hypokalemia. 4. History of left pelvic pain secondary to a lytic left iliac lesion. A metastatic bone survey was unchanged in August of 2012. 5. Simple cyst at the lower pole of the right kidney on a renal ultrasound, 11/08/2006. 6. Multiseptated cyst of the left adnexa on an ultrasound, 11/08/2006. 7. Osteopenia, maintained on Fosamax. 8. Heart murmur.  9.      Report of intermittent rectal bleeding-she declined a rectal exam today. We will make a referral to Dr. Juanda Chance for a screening colonoscopy.  Disposition:  She remains in clinical remission for multiple myeloma. We will followup on a chemistry  panel and serum light chain analysis from today. She will return for an office and lab visit in 6 months. She received an influenza vaccine today.   Cristina Shutters, MD  06/10/2011  12:46 PM

## 2011-06-14 ENCOUNTER — Telehealth: Payer: Self-pay | Admitting: *Deleted

## 2011-06-14 LAB — BASIC METABOLIC PANEL
Chloride: 101 mEq/L (ref 96–112)
Potassium: 4.3 mEq/L (ref 3.5–5.3)
Sodium: 137 mEq/L (ref 135–145)

## 2011-06-14 LAB — PROTEIN ELECTROPHORESIS, SERUM
Alpha-2-Globulin: 10 % (ref 7.1–11.8)
Gamma Globulin: 9.8 % — ABNORMAL LOW (ref 11.1–18.8)
Total Protein, Serum Electrophoresis: 7.3 g/dL (ref 6.0–8.3)

## 2011-06-14 LAB — KAPPA/LAMBDA LIGHT CHAINS
Kappa free light chain: 0.95 mg/dL (ref 0.33–1.94)
Kappa:Lambda Ratio: 1.17 (ref 0.26–1.65)
Lambda Free Lght Chn: 0.81 mg/dL (ref 0.57–2.63)

## 2011-06-14 NOTE — Telephone Encounter (Signed)
Notified patient via VM that light chains are normal and hardcopy of labs are being mailed to her home.

## 2011-06-14 NOTE — Telephone Encounter (Signed)
Message copied by Wandalee Ferdinand on Mon Jun 14, 2011  3:51 PM ------      Message from: Ladene Artist      Created: Sun Jun 13, 2011 12:40 PM       Please call patient, light chains are normal, f/u as scheduled

## 2011-06-18 ENCOUNTER — Telehealth: Payer: Self-pay | Admitting: *Deleted

## 2011-06-18 NOTE — Telephone Encounter (Signed)
Message copied by Wandalee Ferdinand on Fri Jun 18, 2011  7:00 PM ------      Message from: Thornton Papas B      Created: Thu Jun 17, 2011 10:50 PM       Please call patient, lt. Chains normal, no m-spike, f/u as scheduled

## 2011-07-21 ENCOUNTER — Encounter: Payer: Self-pay | Admitting: Internal Medicine

## 2011-08-03 ENCOUNTER — Ambulatory Visit (AMBULATORY_SURGERY_CENTER): Payer: 59 | Admitting: *Deleted

## 2011-08-03 VITALS — Ht 69.5 in | Wt 127.1 lb

## 2011-08-03 DIAGNOSIS — Z1211 Encounter for screening for malignant neoplasm of colon: Secondary | ICD-10-CM

## 2011-08-03 MED ORDER — PEG-KCL-NACL-NASULF-NA ASC-C 100 G PO SOLR: ORAL | Status: DC

## 2011-08-04 ENCOUNTER — Encounter: Payer: Self-pay | Admitting: Gastroenterology

## 2011-08-16 ENCOUNTER — Encounter: Payer: 59 | Admitting: Internal Medicine

## 2011-08-20 ENCOUNTER — Encounter: Payer: Self-pay | Admitting: Gastroenterology

## 2011-08-20 ENCOUNTER — Ambulatory Visit (AMBULATORY_SURGERY_CENTER): Payer: 59 | Admitting: Gastroenterology

## 2011-08-20 VITALS — BP 132/79 | HR 62 | Temp 97.4°F | Resp 20 | Ht 69.0 in | Wt 127.0 lb

## 2011-08-20 DIAGNOSIS — Z1211 Encounter for screening for malignant neoplasm of colon: Secondary | ICD-10-CM

## 2011-08-20 MED ORDER — SODIUM CHLORIDE 0.9 % IV SOLN: 500.0000 mL | INTRAVENOUS | Status: DC

## 2011-08-20 NOTE — Progress Notes (Signed)
Patient did not have preoperative order for IV antibiotic SSI prophylaxis. (G8918)  Patient did not experience any of the following events: a burn prior to discharge; a fall within the facility; wrong site/side/patient/procedure/implant event; or a hospital transfer or hospital admission upon discharge from the facility. (G8907)  

## 2011-08-20 NOTE — Op Note (Signed)
[facility] [address] [place], [place]  [address]  COLONOSCOPY PROCEDURE REPORT  PATIENT:  Cristina Garcia, Cristina Garcia  MR#:  [id number] BIRTHDATE:  1960/11/11, 50 yrs. old  GENDER:  female ENDOSCOPIST:  Judie Petit T. Russella Dar, MD, [facility] Referred by:  Lavada Mesi Truett Perna, M.D. PROCEDURE DATE:  08/20/2011 PROCEDURE:  Colonoscopy 19147 ASA CLASS:  Class II INDICATIONS:  1) Routine Risk Screening MEDICATIONS:   These medications were titrated to patient response per physician's verbal order, Fentanyl 100 mcg IV, Versed 10 mg IV DESCRIPTION OF PROCEDURE:   After the risks benefits and alternatives of the procedure were thoroughly explained, informed consent was obtained.  Digital rectal exam was performed and revealed no abnormalities.   The LB 180AL [id number] endoscope was introduced through the anus and advanced to the cecum, which was identified by both the appendix and ileocecal valve, with a tortuous colon.    The quality of the prep was excellent, using MoviPrep.  The instrument was then slowly withdrawn as the colon was fully examined. <<PROCEDUREIMAGES>> FINDINGS:  A normal appearing cecum, ileocecal valve, and appendiceal orifice were identified. The ascending, hepatic flexure, transverse, splenic flexure, descending, sigmoid colon, and rectum appeared unremarkable.   Retroflexed views in the rectum revealed no abnormalities.    The time to cecum =  7 minutes. The scope was then withdrawn (time =  10  min) from the patient and the procedure completed.  COMPLICATIONS:  None  ENDOSCOPIC IMPRESSION: 1) Normal colon  RECOMMENDATIONS: 1) Continue current colorectal screening for "routine risk" patients with a repeat colonoscopy in 10 years.  Venita Lick. Russella Dar, MD, [facility]  n. eSIGNED:   Venita Lick. Icis Budreau at 08/20/2011 03:45 PM  Domingo Pulse, [phone]

## 2011-08-20 NOTE — Patient Instructions (Signed)

## 2011-08-23 ENCOUNTER — Telehealth: Payer: Self-pay | Admitting: *Deleted

## 2011-08-23 NOTE — Telephone Encounter (Signed)
  Follow up Call-  Call back number 08/20/2011  Post procedure Call Back phone  # [phone]  Permission to leave phone message Yes     Patient questions:  Do you have a fever, pain , or abdominal swelling? no Pain Score  0 *  Have you tolerated food without any problems? yes  Have you been able to return to your normal activities? yes  Do you have any questions about your discharge instructions: Diet   no Medications  no Follow up visit  no  Do you have questions or concerns about your Care? no  Actions: * If pain score is 4 or above: No action needed, pain <4.

## 2011-10-02 ENCOUNTER — Other Ambulatory Visit: Payer: Self-pay | Admitting: Family Medicine

## 2011-11-05 ENCOUNTER — Telehealth: Payer: Self-pay | Admitting: Oncology

## 2011-11-05 NOTE — Telephone Encounter (Signed)
called pts home lmovm that her appt on 06/27 was moved to 07/09 and to rtn call to confirm changes

## 2011-12-09 ENCOUNTER — Ambulatory Visit: Payer: 59 | Admitting: Oncology

## 2011-12-09 ENCOUNTER — Other Ambulatory Visit: Payer: 59 | Admitting: Lab

## 2011-12-20 ENCOUNTER — Other Ambulatory Visit: Payer: Self-pay | Admitting: *Deleted

## 2011-12-21 ENCOUNTER — Ambulatory Visit (HOSPITAL_BASED_OUTPATIENT_CLINIC_OR_DEPARTMENT_OTHER): Payer: 59 | Admitting: Oncology

## 2011-12-21 ENCOUNTER — Other Ambulatory Visit (HOSPITAL_BASED_OUTPATIENT_CLINIC_OR_DEPARTMENT_OTHER): Payer: 59 | Admitting: Lab

## 2011-12-21 ENCOUNTER — Telehealth: Payer: Self-pay | Admitting: Oncology

## 2011-12-21 VITALS — BP 107/66 | HR 64 | Temp 98.3°F | Ht 69.0 in | Wt 126.1 lb

## 2011-12-21 DIAGNOSIS — R011 Cardiac murmur, unspecified: Secondary | ICD-10-CM

## 2011-12-21 DIAGNOSIS — N289 Disorder of kidney and ureter, unspecified: Secondary | ICD-10-CM

## 2011-12-21 DIAGNOSIS — C9001 Multiple myeloma in remission: Secondary | ICD-10-CM

## 2011-12-21 DIAGNOSIS — M899 Disorder of bone, unspecified: Secondary | ICD-10-CM

## 2011-12-21 LAB — CBC WITH DIFFERENTIAL/PLATELET
Basophils Absolute: 0 10*3/uL (ref 0.0–0.1)
Eosinophils Absolute: 0.1 10*3/uL (ref 0.0–0.5)
HCT: 42.5 % (ref 34.8–46.6)
HGB: 14.4 g/dL (ref 11.6–15.9)
LYMPH%: 25.8 % (ref 14.0–49.7)
MCV: 97.2 fL (ref 79.5–101.0)
MONO#: 0.5 10*3/uL (ref 0.1–0.9)
MONO%: 8.5 % (ref 0.0–14.0)
NEUT#: 3.4 10*3/uL (ref 1.5–6.5)
NEUT%: 63.8 % (ref 38.4–76.8)
Platelets: 180 10*3/uL (ref 145–400)
RBC: 4.37 10*6/uL (ref 3.70–5.45)
WBC: 5.3 10*3/uL (ref 3.9–10.3)

## 2011-12-21 LAB — BASIC METABOLIC PANEL
BUN: 32 mg/dL — ABNORMAL HIGH (ref 6–23)
CO2: 26 mEq/L (ref 19–32)
Calcium: 9.6 mg/dL (ref 8.4–10.5)
Creatinine, Ser: 1.15 mg/dL — ABNORMAL HIGH (ref 0.50–1.10)
Glucose, Bld: 72 mg/dL (ref 70–99)
Sodium: 136 mEq/L (ref 135–145)

## 2011-12-21 NOTE — Progress Notes (Signed)
   [facility]    OFFICE PROGRESS NOTE   INTERVAL HISTORY:   She returns as scheduled. She feels well. She developed a rash at the lower legs on a recent vacation. The rash is resolving. No pruritus. She felt that she was developing a "cold "last week. Her symptoms resolved. Continues to have mild discomfort in the left iliac area with certain activities. She saw Dr. Vicente Serene in February (we have not received the [facility] lab studies).  Objective:  Vital signs in last 24 hours:  Blood pressure 107/66, pulse 64, temperature 98.3 F (36.8 C), temperature source Oral, height 5\' 9"  (1.753 m), weight 126 lb 1.6 oz (57.199 kg).   Resp: Lungs clear bilaterally Cardio: Regular rate and rhythm, 2/6 systolic murmur heard throughout the precordium and in the left axilla GI: Nontender, no hepatosplenomegaly Vascular: No leg edema Mild tenderness over the left iliac  Skin: Resolving tan rash over the lower leg bilaterally     Lab Results:  Lab Results  Component Value Date   WBC 5.3 12/21/2011   HGB 14.4 12/21/2011   HCT 42.5 12/21/2011   MCV 97.2 12/21/2011   PLT 180 12/21/2011   ANC 3.4 Potassium 4.3, creatinine 1.3, calcium 10.5, kappa free light chain 0.95, lambda free light chain 0.81, serum M spike not detected on 06/10/2011   Medications: I have reviewed the patient's current medications.  Assessment/Plan: 1. Multiple myeloma, status post high-dose chemotherapy with autologous stem cell support at [facility] in June 2008. She remains in clinical remission. 2. Renal insufficiency secondary to multiple myeloma. We obtained a repeat chemistry panel today. 3. History of hypokalemia. 4. History of left pelvic pain secondary to a lytic left iliac lesion. A metastatic bone survey was unchanged in August of 2012. 5. Simple cyst at the lower pole of the right kidney on a renal ultrasound, 11/08/2006. 6. Multiseptated cyst of the left adnexa on an ultrasound, 11/08/2006. 7. Osteopenia,  maintained on Fosamax. 8. Heart murmur.  9. Report of intermittent rectal bleeding-normal colonoscopy by Dr. Russella Dar on 08/20/2011   Disposition:  She appears stable. We will followup on the chemistry panel and serum light chains from today. She last received a pneumococcal vaccine in February of 2011. She will stay up-to-date on the yearly influenza vaccine.  Ms. Lukasik will return for an office visit in 6 months.   Thornton Papas, MD  12/21/2011  5:46 PM

## 2011-12-21 NOTE — Telephone Encounter (Signed)
Gave pt appt for January 2013 lab and MD °

## 2011-12-23 LAB — PROTEIN ELECTROPHORESIS, SERUM
Alpha-2-Globulin: 11 % (ref 7.1–11.8)
Gamma Globulin: 10.4 % — ABNORMAL LOW (ref 11.1–18.8)
Total Protein, Serum Electrophoresis: 6.3 g/dL (ref 6.0–8.3)

## 2011-12-23 LAB — KAPPA/LAMBDA LIGHT CHAINS
Kappa free light chain: 1.57 mg/dL (ref 0.33–1.94)
Lambda Free Lght Chn: 0.91 mg/dL (ref 0.57–2.63)

## 2011-12-24 ENCOUNTER — Telehealth: Payer: Self-pay | Admitting: Medical Oncology

## 2011-12-24 NOTE — Telephone Encounter (Signed)
Pt notified of spep and copy mailed to her

## 2012-01-25 ENCOUNTER — Other Ambulatory Visit: Payer: Self-pay | Admitting: Family Medicine

## 2012-06-13 ENCOUNTER — Telehealth: Payer: Self-pay | Admitting: Oncology

## 2012-06-13 NOTE — Telephone Encounter (Signed)
Talked to patient and gave her appt for 07/25/12 per pt's choice

## 2012-06-19 ENCOUNTER — Ambulatory Visit: Payer: 59 | Admitting: *Deleted

## 2012-06-22 ENCOUNTER — Other Ambulatory Visit: Payer: 59 | Admitting: Lab

## 2012-06-22 ENCOUNTER — Ambulatory Visit: Payer: 59 | Admitting: Oncology

## 2012-07-20 ENCOUNTER — Other Ambulatory Visit: Payer: 59 | Admitting: Lab

## 2012-07-20 ENCOUNTER — Ambulatory Visit: Payer: 59 | Admitting: Oncology

## 2012-07-25 ENCOUNTER — Ambulatory Visit (HOSPITAL_BASED_OUTPATIENT_CLINIC_OR_DEPARTMENT_OTHER): Payer: 59 | Admitting: Oncology

## 2012-07-25 ENCOUNTER — Other Ambulatory Visit (HOSPITAL_BASED_OUTPATIENT_CLINIC_OR_DEPARTMENT_OTHER): Payer: 59 | Admitting: Lab

## 2012-07-25 ENCOUNTER — Telehealth: Payer: Self-pay | Admitting: Oncology

## 2012-07-25 VITALS — BP 115/72 | HR 66 | Temp 97.9°F | Resp 18 | Ht 69.0 in | Wt 131.6 lb

## 2012-07-25 DIAGNOSIS — C9001 Multiple myeloma in remission: Secondary | ICD-10-CM

## 2012-07-25 DIAGNOSIS — N289 Disorder of kidney and ureter, unspecified: Secondary | ICD-10-CM

## 2012-07-25 DIAGNOSIS — M899 Disorder of bone, unspecified: Secondary | ICD-10-CM

## 2012-07-25 LAB — BASIC METABOLIC PANEL (CC13)
CO2: 28 mEq/L (ref 22–29)
Chloride: 102 mEq/L (ref 98–107)
Sodium: 138 mEq/L (ref 136–145)

## 2012-07-25 LAB — CBC WITH DIFFERENTIAL/PLATELET
Eosinophils Absolute: 0.1 10*3/uL (ref 0.0–0.5)
HCT: 42.2 % (ref 34.8–46.6)
LYMPH%: 29.7 % (ref 14.0–49.7)
MONO#: 0.5 10*3/uL (ref 0.1–0.9)
NEUT#: 3.1 10*3/uL (ref 1.5–6.5)
NEUT%: 58.7 % (ref 38.4–76.8)
Platelets: 168 10*3/uL (ref 145–400)
WBC: 5.4 10*3/uL (ref 3.9–10.3)

## 2012-07-25 NOTE — Telephone Encounter (Signed)
gv and printed appt schedule for pt for Aug °

## 2012-07-25 NOTE — Progress Notes (Signed)
   [facility]    OFFICE PROGRESS NOTE   INTERVAL HISTORY:   She returns as scheduled. She feels well. She reports pain at the left toes since walking exercise last fall. Stable pelvic pain when lifting heavy objects. No other pain. No recent infection. She was seen at [facility] last week.  Objective:  Vital signs in last 24 hours:  Blood pressure 115/72, pulse 66, temperature 97.9 F (36.6 C), temperature source Oral, resp. rate 18, height 5\' 9"  (1.753 m), weight 131 lb 9.6 oz (59.693 kg).  Resp: lungs clear bilaterally Cardio: regular rate and rhythm, 2/6 systolic murmur, midsystolic click GI: no hepatosplenomegaly, nontender Vascular: no leg edema Musculoskeletal: No tenderness at the iliac. Full range of motion at the left hip without pain. Examination of the left foot and toes is unremarkable      Lab Results:  Lab Results  Component Value Date   WBC 5.4 07/25/2012   HGB 14.6 07/25/2012   HCT 42.2 07/25/2012   MCV 96.3 07/25/2012   PLT 168 07/25/2012  ANC 3.1 Potassium 4.3,  BUN 26.2, creatinine 1.4  Serum free kappa light chains 1.57, free lambda light chains 0.91 on 12/21/2011     Medications: I have reviewed the patient's current medications.  Assessment/Plan: 1. Multiple myeloma, status post high-dose chemotherapy with autologous stem cell support at [facility] in June 2008. She remains in clinical remission. 2. Renal insufficiency secondary to multiple myeloma. The creatinine is mildly elevated today. 3. History of hypokalemia. 4. History of left pelvic pain secondary to a lytic left iliac lesion. A metastatic bone survey was unchanged in August of 2012. 5. Simple cyst at the lower pole of the right kidney on a renal ultrasound, 11/08/2006. 6. Multiseptated cyst of the left adnexa on an ultrasound, 11/08/2006. 7. Osteopenia 8. Heart murmur.  9. Report of intermittent rectal bleeding-normal colonoscopy by Cristina Garcia on 08/20/2011    Disposition:  She  remains in clinical remission from multiple myeloma. We will followup on the serum light chains from today. Cristina Garcia will return for an office and lab visit in 6 months. She last had a pneumococcal vaccine in 2011. She remains up-to-date on the yearly influenza vaccine.  She plans to followup with a podiatrist for evaluation of the foot pain. I doubt this is related to multiple myeloma.   Cristina Papas, MD  07/25/2012  7:20 PM

## 2012-07-28 ENCOUNTER — Telehealth: Payer: Self-pay | Admitting: *Deleted

## 2012-07-28 LAB — KAPPA/LAMBDA LIGHT CHAINS: Lambda Free Lght Chn: 1.07 mg/dL (ref 0.57–2.63)

## 2012-07-28 LAB — PROTEIN ELECTROPHORESIS, SERUM
Alpha-2-Globulin: 9.8 % (ref 7.1–11.8)
Beta Globulin: 5.6 % (ref 4.7–7.2)
Gamma Globulin: 11.4 % (ref 11.1–18.8)

## 2012-07-28 NOTE — Telephone Encounter (Signed)
Notified patient that light chains and SPEP are normal. Copy to [facility].

## 2012-07-28 NOTE — Telephone Encounter (Signed)
Message copied by Wandalee Ferdinand on Fri Jul 28, 2012  4:40 PM ------      Message from: Thornton Papas B      Created: Thu Jul 27, 2012  8:49 AM       Please call patient, light chains are normal, f/u as scheduled ------

## 2012-07-30 ENCOUNTER — Other Ambulatory Visit: Payer: Self-pay | Admitting: Family Medicine

## 2012-12-09 ENCOUNTER — Other Ambulatory Visit: Payer: Self-pay | Admitting: Family Medicine

## 2013-01-22 ENCOUNTER — Ambulatory Visit (HOSPITAL_BASED_OUTPATIENT_CLINIC_OR_DEPARTMENT_OTHER): Payer: 59 | Admitting: Oncology

## 2013-01-22 ENCOUNTER — Other Ambulatory Visit (HOSPITAL_BASED_OUTPATIENT_CLINIC_OR_DEPARTMENT_OTHER): Payer: 59 | Admitting: Lab

## 2013-01-22 ENCOUNTER — Telehealth: Payer: Self-pay | Admitting: *Deleted

## 2013-01-22 VITALS — BP 120/76 | HR 65 | Temp 98.7°F | Resp 20 | Ht 69.0 in | Wt 132.8 lb

## 2013-01-22 DIAGNOSIS — M810 Age-related osteoporosis without current pathological fracture: Secondary | ICD-10-CM

## 2013-01-22 DIAGNOSIS — C9001 Multiple myeloma in remission: Secondary | ICD-10-CM

## 2013-01-22 LAB — CBC WITH DIFFERENTIAL/PLATELET
Basophils Absolute: 0 10*3/uL (ref 0.0–0.1)
EOS%: 1.6 % (ref 0.0–7.0)
HCT: 44.6 % (ref 34.8–46.6)
HGB: 15.4 g/dL (ref 11.6–15.9)
MCH: 33.7 pg (ref 25.1–34.0)
MCV: 97.5 fL (ref 79.5–101.0)
MONO%: 9.7 % (ref 0.0–14.0)
NEUT%: 63 % (ref 38.4–76.8)

## 2013-01-22 LAB — BASIC METABOLIC PANEL (CC13)
BUN: 24.3 mg/dL (ref 7.0–26.0)
CO2: 29 mEq/L (ref 22–29)
Chloride: 103 mEq/L (ref 98–109)
Creatinine: 1.2 mg/dL — ABNORMAL HIGH (ref 0.6–1.1)

## 2013-01-22 NOTE — Progress Notes (Signed)
   [facility]    OFFICE PROGRESS NOTE   INTERVAL HISTORY:   She returns as scheduled. No recent infection, no pain, no complaint.  Objective:  Vital signs in last 24 hours:  Blood pressure 120/76, pulse 65, temperature 98.7 F (37.1 C), temperature source Oral, resp. rate 20, height 5\' 9"  (1.753 m), weight 132 lb 12.8 oz (60.238 kg).   Resp: Lungs clear bilaterally Cardio: Regular rate and rhythm, 2/6 systolic murmur, systolic click GI: No hepatosplenomegaly Vascular: No leg edema Musculoskeletal: No spine or iliac tenderness    Lab Results:  Lab Results  Component Value Date   WBC 5.5 01/22/2013   HGB 15.4 01/22/2013   HCT 44.6 01/22/2013   MCV 97.5 01/22/2013   PLT 167 01/22/2013   ANC 3.4 Potassium 3.8, BUN 24.3, creatinine 1.2  07/25/2012-serum M spike not detected, kappa free light chains 1.07, lambda free light chains 1.07  Medications: I have reviewed the patient's current medications.  Assessment/Plan: 1. Multiple myeloma, status post high-dose chemotherapy with autologous stem cell support at [facility] in June 2008. She remains in clinical remission. 2. Renal insufficiency secondary to multiple myeloma. The creatinine is stable 3. History of hypokalemia. 4. History of left pelvic pain secondary to a lytic left iliac lesion. A metastatic bone survey was unchanged in August of 2012. 5. Simple cyst at the lower pole of the right kidney on a renal ultrasound, 11/08/2006. 6. Multiseptated cyst of the left adnexa on an ultrasound, 11/08/2006. 7. Osteopenia 8. Heart murmur.        9. Report of intermittent rectal bleeding-normal colonoscopy by Dr. Russella Dar on 08/20/2011    Disposition:  There is no clinical or laboratory evidence of progressive myeloma . We will followup on the serum protein electrophoresis and light chains from today. She will return for an office and lab visit in 6 months.  Ms. Nathanson will stay up-to-date on the influenza vaccine. She is  due for a pneumococcal vaccine in 2016.   Thornton Papas, MD  01/22/2013  4:35 PM

## 2013-01-22 NOTE — Telephone Encounter (Signed)
appts made and printed...td 

## 2013-01-23 LAB — KAPPA/LAMBDA LIGHT CHAINS: Kappa:Lambda Ratio: 2.22 — ABNORMAL HIGH (ref 0.26–1.65)

## 2013-01-26 ENCOUNTER — Telehealth: Payer: Self-pay | Admitting: *Deleted

## 2013-01-26 NOTE — Telephone Encounter (Signed)
Called and left vm on home # with information.  SLJ

## 2013-01-26 NOTE — Telephone Encounter (Signed)
Message copied by Caren Griffins on Fri Jan 26, 2013  2:56 PM ------      Message from: Wandalee Ferdinand      Created: Fri Jan 26, 2013  2:37 PM                   ----- Message -----         From: Ladene Artist, MD         Sent: 01/24/2013   8:36 PM           To: Wandalee Ferdinand, RN, Glori Luis, RN, #            Please call patient, light chains are normal ------

## 2013-02-18 ENCOUNTER — Other Ambulatory Visit: Payer: Self-pay | Admitting: Family Medicine

## 2013-03-30 ENCOUNTER — Other Ambulatory Visit: Payer: Self-pay | Admitting: Family Medicine

## 2013-04-10 ENCOUNTER — Telehealth: Payer: Self-pay | Admitting: Family Medicine

## 2013-04-10 NOTE — Telephone Encounter (Signed)
Pt coming in on 06/21/13 for cpx.  She is a patient at the [facility] and have labs drawn on a regular basis there.  Pt inquiring if she labs from cancer center can be used or does she still need to still come in a week prior to cpx to have cpx labs drawn here.  Also pt needs refill of lisinopril (PRINIVIL,ZESTRIL) 5 MG tablet to hold her over until she comes in for her cpx.  Please send refill to [facility] [address]

## 2013-04-11 MED ORDER — LISINOPRIL 5 MG PO TABS: ORAL_TABLET | ORAL | Status: DC

## 2013-04-11 NOTE — Telephone Encounter (Signed)
Left message on machine for patient.  Okay to wait on labs and refill sent

## 2013-04-11 NOTE — Telephone Encounter (Signed)
ok 

## 2013-06-19 ENCOUNTER — Other Ambulatory Visit: Payer: Self-pay | Admitting: Family Medicine

## 2013-06-19 ENCOUNTER — Telehealth: Payer: Self-pay | Admitting: Family Medicine

## 2013-06-19 NOTE — Telephone Encounter (Signed)
Pt has an appt with Dr. Sherren Mocha on 06/21/13 for her cpx and wants to know if she is needs to have lab work drawn prior to her appt.  Pt states she has had labs performed that were ordered by other physicians, will those labs suffice?

## 2013-06-20 NOTE — Telephone Encounter (Signed)
Left message on machine for patient

## 2013-06-21 ENCOUNTER — Encounter: Payer: Self-pay | Admitting: Family Medicine

## 2013-06-21 ENCOUNTER — Other Ambulatory Visit (HOSPITAL_COMMUNITY)
Admission: RE | Admit: 2013-06-21 | Discharge: 2013-06-21 | Disposition: A | Discharge status: Home / Self Care | Payer: 59 | Source: Ambulatory Visit | Attending: Family Medicine | Admitting: Family Medicine

## 2013-06-21 ENCOUNTER — Ambulatory Visit (INDEPENDENT_AMBULATORY_CARE_PROVIDER_SITE_OTHER): Payer: 59 | Admitting: Family Medicine

## 2013-06-21 VITALS — BP 110/70 | Temp 98.3°F | Ht 68.5 in | Wt 135.0 lb

## 2013-06-21 DIAGNOSIS — IMO0002 Reserved for concepts with insufficient information to code with codable children: Secondary | ICD-10-CM

## 2013-06-21 DIAGNOSIS — M810 Age-related osteoporosis without current pathological fracture: Secondary | ICD-10-CM

## 2013-06-21 DIAGNOSIS — I341 Nonrheumatic mitral (valve) prolapse: Secondary | ICD-10-CM

## 2013-06-21 DIAGNOSIS — Z23 Encounter for immunization: Secondary | ICD-10-CM

## 2013-06-21 DIAGNOSIS — J309 Allergic rhinitis, unspecified: Secondary | ICD-10-CM

## 2013-06-21 DIAGNOSIS — Z01419 Encounter for gynecological examination (general) (routine) without abnormal findings: Secondary | ICD-10-CM

## 2013-06-21 DIAGNOSIS — C9001 Multiple myeloma in remission: Secondary | ICD-10-CM

## 2013-06-21 DIAGNOSIS — N952 Postmenopausal atrophic vaginitis: Secondary | ICD-10-CM

## 2013-06-21 DIAGNOSIS — I059 Rheumatic mitral valve disease, unspecified: Secondary | ICD-10-CM

## 2013-06-21 MED ORDER — LISINOPRIL 5 MG PO TABS: ORAL_TABLET | ORAL | Status: DC

## 2013-06-21 MED ORDER — ESTROGENS, CONJUGATED 0.625 MG/GM VA CREA: TOPICAL_CREAM | VAGINAL | Status: DC

## 2013-06-21 NOTE — Progress Notes (Signed)
   Subjective:    Patient ID: Cristina Garcia, female    DOB: 1961/02/21, 53 y.o.   MRN: [id number]  HPI [name] is a 53 year old married female nonsmoker who comes in today for general physical examination  In 2008 she had a bone marrow transplant for multiple myeloma. She's done well since that time with no recurrence. She's followed every 6 months the oncology center  She uses Premarin vaginal cream sparingly for vaginal dryness  She takes lisinopril 5 mg daily for hypertension BP 110/70  She gets routine eye care dental care but does not do BSE monthly and has not had a mammogram for couple years. Colonoscopy couple years ago normal  Vaccinations updated  She has a history of mitral valve prolapse and feels that it might be bothering her more.  She brings in a CBC and a he med which are normal except for creatinine 1.2   Review of Systems  Constitutional: Negative.   HENT: Negative.   Eyes: Negative.   Respiratory: Negative.   Cardiovascular: Negative.   Gastrointestinal: Negative.   Genitourinary: Negative.   Musculoskeletal: Negative.   Neurological: Negative.   Psychiatric/Behavioral: Negative.        Objective:   Physical Exam  Nursing note and vitals reviewed. Constitutional: She appears well-developed and well-nourished.  HENT:  Head: Normocephalic and atraumatic.  Right Ear: External ear normal.  Left Ear: External ear normal.  Nose: Nose normal.  Mouth/Throat: Oropharynx is clear and moist.  Eyes: EOM are normal. Pupils are equal, round, and reactive to light.  Neck: Normal range of motion. Neck supple. No thyromegaly present.  Cardiovascular: Normal rate, regular rhythm and intact distal pulses.  Exam reveals no gallop and no friction rub.   Murmur heard. She has a grade 3/6 systolic ejection murmur consistent with mitral valve prolapse  Pulmonary/Chest: Effort normal and breath sounds normal.  Abdominal: Soft. Bowel sounds are normal. She exhibits no  distension and no mass. There is no tenderness. There is no rebound.  Genitourinary: Vagina normal and uterus normal. Guaiac negative stool. No vaginal discharge found.  Bilateral breast exam normal except for scars from previous implants  Musculoskeletal: Normal range of motion. She exhibits no edema and no tenderness.  Lymphadenopathy:    She has no cervical adenopathy.  Neurological: She is alert. She has normal reflexes. No cranial nerve deficit. She exhibits normal muscle tone. Coordination normal.  Skin: Skin is warm and dry.  Total body skin exam normal  Psychiatric: She has a normal mood and affect. Her behavior is normal. Judgment and thought content normal.          Assessment & Plan:  Healthy female  Status post multiple myeloma x6 years bone marrow transplant  Mitral valve prolapse........ check echocardiogram  Hypertension continue lisinopril 5 mg daily  Postmenopausal vaginal dryness hormonal cream twice weekly

## 2013-06-21 NOTE — Progress Notes (Signed)
Pre visit review using our clinic review tool, if applicable. No additional management support is needed unless otherwise documented below in the visit note. 

## 2013-06-21 NOTE — Patient Instructions (Signed)
With your next lab draw request a lipid and liver panel, TSH level, urinalysis, and GFR  Continue current medications  We will get you set up for an echocardiogram to check out your mitral valve  Use small amounts of hormonal cream twice weekly  Return in one year sooner if any problems

## 2013-07-12 ENCOUNTER — Ambulatory Visit (HOSPITAL_COMMUNITY): Payer: 59 | Attending: Cardiology | Admitting: Radiology

## 2013-07-12 DIAGNOSIS — I059 Rheumatic mitral valve disease, unspecified: Secondary | ICD-10-CM

## 2013-07-12 DIAGNOSIS — I10 Essential (primary) hypertension: Secondary | ICD-10-CM | POA: Insufficient documentation

## 2013-07-12 DIAGNOSIS — Z87898 Personal history of other specified conditions: Secondary | ICD-10-CM | POA: Insufficient documentation

## 2013-07-12 DIAGNOSIS — I341 Nonrheumatic mitral (valve) prolapse: Secondary | ICD-10-CM

## 2013-07-12 NOTE — Progress Notes (Signed)
Echocardiogram performed.  

## 2013-07-13 ENCOUNTER — Other Ambulatory Visit: Payer: Self-pay | Admitting: Family Medicine

## 2013-07-13 DIAGNOSIS — I059 Rheumatic mitral valve disease, unspecified: Secondary | ICD-10-CM

## 2013-07-16 ENCOUNTER — Telehealth: Payer: Self-pay | Admitting: Cardiology

## 2013-07-16 ENCOUNTER — Other Ambulatory Visit: Payer: Self-pay | Admitting: Cardiology

## 2013-07-16 ENCOUNTER — Encounter: Payer: Self-pay | Admitting: *Deleted

## 2013-07-16 DIAGNOSIS — I059 Rheumatic mitral valve disease, unspecified: Secondary | ICD-10-CM

## 2013-07-16 NOTE — Telephone Encounter (Signed)
New problem   Need to schedule TEE

## 2013-07-16 NOTE — Telephone Encounter (Signed)
Spoke with pt and she would like to proceed with TEE on August 06, 2013.  TEE scheduled for 9 AM.  I verbally went over all instructions with pt and she verbalizes understanding. She does not have any questions at this time.  I will mail instruction sheet and TEE pamphlet to her.  Pt is having blood work which includes CBC and BMP done on July 31, 2013 at the [facility].

## 2013-07-16 NOTE — Telephone Encounter (Signed)
Spoke with Janace Hoard and let her know first date available for TEE by Dr. Stanford Breed is August 06, 2013. She reports Dr. Sherren Mocha would like Dr. Stanford Breed to do TEE and February 23 would be OK.

## 2013-07-17 ENCOUNTER — Telehealth: Payer: Self-pay | Admitting: Family Medicine

## 2013-07-17 NOTE — Telephone Encounter (Signed)
Relevant patient education mailed to patient.  

## 2013-07-24 ENCOUNTER — Encounter: Payer: Self-pay | Admitting: Family Medicine

## 2013-07-24 ENCOUNTER — Ambulatory Visit (INDEPENDENT_AMBULATORY_CARE_PROVIDER_SITE_OTHER): Payer: 59 | Admitting: Family Medicine

## 2013-07-24 VITALS — BP 120/80 | Temp 98.8°F | Wt 133.0 lb

## 2013-07-24 DIAGNOSIS — R0789 Other chest pain: Secondary | ICD-10-CM

## 2013-07-24 DIAGNOSIS — J45909 Unspecified asthma, uncomplicated: Secondary | ICD-10-CM | POA: Insufficient documentation

## 2013-07-24 DIAGNOSIS — R071 Chest pain on breathing: Secondary | ICD-10-CM

## 2013-07-24 MED ORDER — PREDNISONE 20 MG PO TABS: ORAL_TABLET | ORAL | Status: DC

## 2013-07-24 MED ORDER — HYDROCODONE-HOMATROPINE 5-1.5 MG/5ML PO SYRP: 5.0000 mL | ORAL_SOLUTION | Freq: Three times a day (TID) | ORAL | Status: DC | PRN

## 2013-07-24 NOTE — Progress Notes (Signed)
   Subjective:    Patient ID: Cristina Garcia, female    DOB: 1961-04-16, 53 y.o.   MRN: [id number]  HPI Cristina Garcia is a 53 year old married female nonsmoker who comes in today with a 3-1/2 week history of cough and chest wall pain  Care called about 3 weeks ago went away but the cough has persisted. No fever chills nausea vomiting diarrhea or sputum production. She's also developed some right upper chest wall pain with coughing    Review of Systems Review of systems negative    Objective:   Physical Exam  Well-developed and nourished female no acute distress vital signs stable she is afebrile HEENT negative neck was supple no adenopathy lungs are clear palpable tenderness right upper anterior chest wall muscles      Assessment & Plan:  Viral syndrome with secondary reactive airway disease and chest wall pain......... see orders

## 2013-07-24 NOTE — Patient Instructions (Signed)
Take the prednisone as directed........... 2 tabs x3 days and then taper as outlined  Hydromet.............Marland Kitchen 1/2-1 teaspoon at bedtime when necessary for cough

## 2013-07-30 ENCOUNTER — Encounter: Payer: Self-pay | Admitting: Family Medicine

## 2013-07-30 ENCOUNTER — Ambulatory Visit (INDEPENDENT_AMBULATORY_CARE_PROVIDER_SITE_OTHER): Payer: 59 | Admitting: Family Medicine

## 2013-07-30 VITALS — BP 120/80 | HR 103 | Temp 98.6°F | Wt 133.0 lb

## 2013-07-30 DIAGNOSIS — J019 Acute sinusitis, unspecified: Secondary | ICD-10-CM

## 2013-07-30 MED ORDER — AZITHROMYCIN 250 MG PO TABS: ORAL_TABLET | ORAL | Status: AC

## 2013-07-30 NOTE — Progress Notes (Signed)
   Subjective:    Patient ID: Cristina Garcia, female    DOB: [birth year], 53 y.o.   MRN: [id number]  Cough Associated symptoms include a fever. Pertinent negatives include no chest pain, chills, sore throat or shortness of breath.   Patient seen with progressive productive cough and some greenish nasal discharge. She denies any headaches. Possible low-grade fever over the past few days. No fever present today. Her cough is productive of green sputum. She states she feels worse than she did last visit. She was prescribed prednisone and for chest wall pain. Her chest wall pain is stable. Patient nonsmoker. She has remote history of multiple myeloma and has been in remission for several years.  Past Medical History  Diagnosis Date  . Allergy   . H/O multiple myeloma   . Mitral valve prolapse   . Hypertension   . Osteoporosis   . Constipation    Past Surgical History  Procedure Laterality Date  . Breast enhancement surgery  2000  . Oophorectomy  1991    left  . Limbal stem cell transplant  2008  . Cesarean section  1992  . Wisdom tooth extraction  1975    reports that she has never smoked. She has never used smokeless tobacco. She reports that she drinks about 2.4 ounces of alcohol per week. She reports that she does not use illicit drugs. family history includes Diabetes in an other family member; Hodgkin's lymphoma in her mother; Hypertension in an other family member; Lymphoma in an other family member. There is no history of Colon cancer or Stomach cancer. No Known Allergies    Review of Systems  Constitutional: Positive for fever and fatigue. Negative for chills.  HENT: Positive for congestion and sinus pressure. Negative for sore throat.   Respiratory: Positive for cough. Negative for shortness of breath.   Cardiovascular: Negative for chest pain.       Objective:   Physical Exam  Constitutional: She appears well-developed and well-nourished.  HENT:  Right Ear: External  ear normal.  Left Ear: External ear normal.  Mouth/Throat: Oropharynx is clear and moist.  Neck: Neck supple.  Cardiovascular: Normal rate.   Pulmonary/Chest:  Patient has some mild diffuse wheezes. No rales. No retractions  Musculoskeletal: She exhibits no edema.  Lymphadenopathy:    She has no cervical adenopathy.          Assessment & Plan:  Productive cough with nonfocal exam. Suspect she has acute sinusitis. She's had progressive symptoms over 2 weeks duration. Start Zithromax. Finish out prednisone. Followup for any fever or worsening symptoms

## 2013-07-30 NOTE — Patient Instructions (Signed)
Follow up promptly for any increased shortness of breath or increasing fevers.

## 2013-07-31 ENCOUNTER — Telehealth: Payer: Self-pay | Admitting: *Deleted

## 2013-07-31 ENCOUNTER — Telehealth: Payer: Self-pay | Admitting: Oncology

## 2013-07-31 ENCOUNTER — Other Ambulatory Visit (HOSPITAL_BASED_OUTPATIENT_CLINIC_OR_DEPARTMENT_OTHER): Payer: 59

## 2013-07-31 ENCOUNTER — Ambulatory Visit (HOSPITAL_BASED_OUTPATIENT_CLINIC_OR_DEPARTMENT_OTHER): Payer: 59 | Admitting: Oncology

## 2013-07-31 ENCOUNTER — Encounter (INDEPENDENT_AMBULATORY_CARE_PROVIDER_SITE_OTHER): Payer: Self-pay

## 2013-07-31 ENCOUNTER — Ambulatory Visit (HOSPITAL_COMMUNITY)
Admission: RE | Admit: 2013-07-31 | Discharge: 2013-07-31 | Disposition: A | Discharge status: Home / Self Care | Payer: 59 | Source: Ambulatory Visit | Attending: Nurse Practitioner | Admitting: Nurse Practitioner

## 2013-07-31 VITALS — BP 147/73 | HR 89 | Temp 98.0°F | Resp 18 | Ht 68.0 in | Wt 132.6 lb

## 2013-07-31 DIAGNOSIS — R059 Cough, unspecified: Secondary | ICD-10-CM

## 2013-07-31 DIAGNOSIS — C9001 Multiple myeloma in remission: Secondary | ICD-10-CM

## 2013-07-31 DIAGNOSIS — R918 Other nonspecific abnormal finding of lung field: Secondary | ICD-10-CM

## 2013-07-31 DIAGNOSIS — R05 Cough: Secondary | ICD-10-CM

## 2013-07-31 DIAGNOSIS — M949 Disorder of cartilage, unspecified: Secondary | ICD-10-CM

## 2013-07-31 DIAGNOSIS — N289 Disorder of kidney and ureter, unspecified: Secondary | ICD-10-CM

## 2013-07-31 DIAGNOSIS — M899 Disorder of bone, unspecified: Secondary | ICD-10-CM

## 2013-07-31 LAB — BASIC METABOLIC PANEL
BUN: 18 mg/dL (ref 6–23)
CO2: 25 mEq/L (ref 19–32)
Calcium: 9.3 mg/dL (ref 8.4–10.5)
Chloride: 99 mEq/L (ref 96–112)
Creatinine, Ser: 1.18 mg/dL — ABNORMAL HIGH (ref 0.50–1.10)
Glucose, Bld: 99 mg/dL (ref 70–99)
POTASSIUM: 4 meq/L (ref 3.5–5.3)
Sodium: 139 mEq/L (ref 135–145)

## 2013-07-31 LAB — CBC WITH DIFFERENTIAL/PLATELET
BASO%: 0.3 % (ref 0.0–2.0)
BASOS ABS: 0 10*3/uL (ref 0.0–0.1)
EOS ABS: 0 10*3/uL (ref 0.0–0.5)
EOS%: 0 % (ref 0.0–7.0)
HEMATOCRIT: 46.2 % (ref 34.8–46.6)
HGB: 15.6 g/dL (ref 11.6–15.9)
LYMPH#: 1.1 10*3/uL (ref 0.9–3.3)
LYMPH%: 10.7 % — ABNORMAL LOW (ref 14.0–49.7)
MCH: 32.6 pg (ref 25.1–34.0)
MCHC: 33.8 g/dL (ref 31.5–36.0)
MCV: 96.2 fL (ref 79.5–101.0)
MONO#: 0.5 10*3/uL (ref 0.1–0.9)
MONO%: 5.2 % (ref 0.0–14.0)
NEUT#: 8.6 10*3/uL — ABNORMAL HIGH (ref 1.5–6.5)
NEUT%: 83.8 % — AB (ref 38.4–76.8)
Platelets: 199 10*3/uL (ref 145–400)
RBC: 4.8 10*6/uL (ref 3.70–5.45)
RDW: 13.1 % (ref 11.2–14.5)
WBC: 10.2 10*3/uL (ref 3.9–10.3)

## 2013-07-31 NOTE — Telephone Encounter (Signed)
Called and informed patient that chest xray results showed no evidence of pneumonia.  Per Dr. Benay Spice.  Patient verbalized understanding.

## 2013-07-31 NOTE — Progress Notes (Addendum)
OFFICE PROGRESS NOTE  Interval history:  Cristina Garcia returns for followup of multiple myeloma. She had a cold in early January. Symptoms initially improved. She recently developed an increased cough. She was seen by Dr. Sherren Mocha on 07/24/2013 and started on steroids. She reports developing a low-grade fever of 100.2 around 07/28/2013. The cough has persisted. The cough is intermittently productive. She has nasal congestion. She was started on a course of Zithromax on 07/30/2013. No significant shortness of breath. No sore throat. She denies any muscle aches. She has been taking Sudafed. She notes that Sudafed increases her heart rate. She reports that prior to onset of initial symptoms in early January she has been very "healthy". She has never smoked. She reports receiving the influenza vaccine.   Objective: Filed Vitals:   07/31/13 1347  BP: 147/73  Pulse: 89  Temp: 98 F (36.7 C)  Resp: 18   oxygen saturation 94-96% on room air.  No thrush or ulcerations. No palpable cervical, supraclavicular, axillary lymph nodes. Lungs with bilateral wheezes and rhonchi. Rales at both lung bases left greater than right. Slightly irregular cardiac rhythm. 2/6 systolic murmur. Abdomen soft and nontender. No organomegaly. No leg edema.   Lab Results: Lab Results  Component Value Date   WBC 10.2 07/31/2013   HGB 15.6 07/31/2013   HCT 46.2 07/31/2013   MCV 96.2 07/31/2013   PLT 199 07/31/2013   NEUTROABS 8.6* 07/31/2013    Chemistry:    Chemistry      Component Value Date/Time   NA 141 01/22/2013 1518   NA 136 12/21/2011 1532   K 3.8 01/22/2013 1518   K 4.0 12/21/2011 1532   CL 102 07/25/2012 1532   CL 100 12/21/2011 1532   CO2 29 01/22/2013 1518   CO2 26 12/21/2011 1532   BUN 24.3 01/22/2013 1518   BUN 32* 12/21/2011 1532   CREATININE 1.2* 01/22/2013 1518   CREATININE 1.15* 12/21/2011 1532      Component Value Date/Time   CALCIUM 10.0 01/22/2013 1518   CALCIUM 9.6 12/21/2011 1532   ALKPHOS 41 08/13/2010 0835   AST  19 08/13/2010 0835   ALT 17 08/13/2010 0835   BILITOT 1.3* 08/13/2010 0835       Studies/Results: No results found.  Medications: I have reviewed the patient's current medications.  Assessment/Plan: 1. Multiple myeloma, status post high-dose chemotherapy with autologous stem cell support at [facility] in June 2008. She remains in clinical remission. 2. Renal insufficiency secondary to multiple myeloma.  3. History of hypokalemia. 4. History of left pelvic pain secondary to a lytic left iliac lesion. A metastatic bone survey was unchanged in August of 2012. 5. Simple cyst at the lower pole of the right kidney on a renal ultrasound, 11/08/2006. 6. Multiseptated cyst of the left adnexa on an ultrasound, 11/08/2006. 7. Osteopenia 8. Heart murmur.  9. Report of intermittent rectal bleeding. Normal colonoscopy by Dr. Fuller Plan on 08/20/2011. 10. Cough, persistent over several weeks. Question viral URI, question pneumonia.  Dispositon-she remains in clinical remission from multiple myeloma. We will followup on the serum light chains from today. She will return for a followup visit and labs in 6 months.  We are referring her for a chest x-ray today due to the persistent cough and abnormal lung exam. If there is evidence of pneumonia we will change the antibiotic from Zithromax to Levaquin.  Patient seen with Dr. Benay Spice. 25 minutes were spent face-to-face at today's visit that the majority of the time involved in counseling/coordination  of care.    Ned Card ANP/GNP-BC   This was a shared visit with Ned Card. Cristina Garcia was interviewed and examined. She remains in clinical remission from the multiple myeloma. We will followup on the chemistry panel and serum light chains from today. We will refer her for a chest x-ray to be sure she has not developed pneumonia.  Julieanne Manson, M.D.

## 2013-07-31 NOTE — Telephone Encounter (Signed)
Gave pt appt for lab and MD  for August 2015 °

## 2013-08-01 ENCOUNTER — Telehealth: Payer: Self-pay | Admitting: *Deleted

## 2013-08-01 NOTE — Telephone Encounter (Signed)
Message copied by Tania Ade on Wed Aug 01, 2013  1:59 PM ------      Message from: [name] B      Created: Wed Aug 01, 2013  1:45 PM       Please call patient, light chains are normal ------

## 2013-08-01 NOTE — Telephone Encounter (Signed)
Per MD, notified pt that light chains are normal; pt verbalized understanding.

## 2013-08-01 NOTE — Telephone Encounter (Signed)
Left VM for her to call office.

## 2013-08-01 NOTE — Telephone Encounter (Signed)
Message copied by Domenic Schwab on Wed Aug 01, 2013  3:29 PM ------      Message from: [name] B      Created: Wed Aug 01, 2013  1:45 PM       Please call patient, light chains are normal ------

## 2013-08-02 ENCOUNTER — Telehealth: Payer: Self-pay | Admitting: Cardiology

## 2013-08-02 LAB — KAPPA/LAMBDA LIGHT CHAINS
KAPPA LAMBDA RATIO: 1.21 (ref 0.26–1.65)
Kappa free light chain: 1.82 mg/dL (ref 0.33–1.94)
LAMBDA FREE LGHT CHN: 1.51 mg/dL (ref 0.57–2.63)

## 2013-08-02 LAB — SPEP & IFE WITH QIG
Albumin ELP: 58.5 % (ref 55.8–66.1)
Alpha-1-Globulin: 8.3 % — ABNORMAL HIGH (ref 2.9–4.9)
Alpha-2-Globulin: 12.3 % — ABNORMAL HIGH (ref 7.1–11.8)
BETA 2: 3.5 % (ref 3.2–6.5)
Beta Globulin: 5.4 % (ref 4.7–7.2)
Gamma Globulin: 12 % (ref 11.1–18.8)
IGA: 118 mg/dL (ref 69–380)
IGG (IMMUNOGLOBIN G), SERUM: 925 mg/dL (ref 690–1700)
IGM, SERUM: 123 mg/dL (ref 52–322)
Total Protein, Serum Electrophoresis: 6.7 g/dL (ref 6.0–8.3)

## 2013-08-02 NOTE — Telephone Encounter (Signed)
Left message for pt, aware TEE has been cancelled. Pt to call back to discuss rescheduling

## 2013-08-02 NOTE — Telephone Encounter (Signed)
New problem   Pt need to r/s her TEE w/[facility] on 08/06/13 b/c she is sick and can't hardly breath. Please call pt to r/s.

## 2013-08-06 ENCOUNTER — Ambulatory Visit (HOSPITAL_COMMUNITY): Admission: RE | Admit: 2013-08-06 | Payer: 59 | Source: Ambulatory Visit | Admitting: Cardiology

## 2013-08-06 ENCOUNTER — Encounter (HOSPITAL_COMMUNITY): Admission: RE | Payer: Self-pay | Source: Ambulatory Visit

## 2013-08-06 SURGERY — ECHOCARDIOGRAM, TRANSESOPHAGEAL
Anesthesia: Moderate Sedation

## 2013-08-08 NOTE — Telephone Encounter (Signed)
Received call from staff member of Dr Honor Junes requesting to schedule TEE. I reviewed note and saw pt was already scheduled for 08/06/13 and she cancelled due to illness. Pt is to call back to reschedule. Staff member was not aware of this and will update Dr Sherren Mocha, they will call back with further need.

## 2013-08-15 ENCOUNTER — Telehealth: Payer: Self-pay | Admitting: Cardiology

## 2013-08-15 NOTE — Telephone Encounter (Signed)
New Message  Pt called states that she was advised to call and schedule a TEE per Dr. Stanford Breed Please assist

## 2013-08-15 NOTE — Telephone Encounter (Signed)
Left message for pt to call.

## 2013-08-16 NOTE — Telephone Encounter (Signed)
Follow up    Pt wants you to please called on work cell # [phone]

## 2013-08-16 NOTE — Telephone Encounter (Signed)
Spoke with pt, she had to cancel the TEE that was scheduled. She does not have to be scheduled with dr Stanford Breed, this is per her PCP dr todd. She is going to check her schedule and call back with dates that will work for her.

## 2013-08-17 ENCOUNTER — Encounter: Payer: Self-pay | Admitting: *Deleted

## 2013-08-17 NOTE — Telephone Encounter (Signed)
Follow up         Pt says 3/18 will work for her for the TEE.

## 2013-08-17 NOTE — Telephone Encounter (Signed)
Lmtcb/ 3/18 the two doctors available that day do not do TEE's. Ask pt if there is another day.

## 2013-08-17 NOTE — Telephone Encounter (Signed)
Pt can do TEE 3/25/ see letter I will mail letter to pt/confirmed time with pt/  I will msg Dr Aundra Dubin to write orders since pt is seen by Dr Sherren Mocha,

## 2013-09-05 ENCOUNTER — Encounter (HOSPITAL_COMMUNITY): Payer: Self-pay | Admitting: *Deleted

## 2013-09-05 ENCOUNTER — Encounter (HOSPITAL_COMMUNITY)
Admission: RE | Disposition: A | Discharge status: Home / Self Care | Payer: Self-pay | Source: Ambulatory Visit | Attending: Cardiology

## 2013-09-05 ENCOUNTER — Ambulatory Visit (HOSPITAL_COMMUNITY)
Admission: RE | Admit: 2013-09-05 | Discharge: 2013-09-05 | Disposition: A | Discharge status: Home / Self Care | Payer: 59 | Source: Ambulatory Visit | Attending: Cardiology | Admitting: Cardiology

## 2013-09-05 DIAGNOSIS — I059 Rheumatic mitral valve disease, unspecified: Secondary | ICD-10-CM

## 2013-09-05 HISTORY — PX: TEE WITHOUT CARDIOVERSION: SHX5443

## 2013-09-05 SURGERY — ECHOCARDIOGRAM, TRANSESOPHAGEAL
Anesthesia: Moderate Sedation

## 2013-09-05 MED ORDER — FENTANYL CITRATE 0.05 MG/ML IJ SOLN
INTRAMUSCULAR | Status: DC | PRN
  Administered 2013-09-05 (×2): 25 ug via INTRAVENOUS

## 2013-09-05 MED ORDER — SODIUM CHLORIDE 0.9 % IV SOLN
INTRAVENOUS | Status: DC
  Administered 2013-09-05: 500 mL via INTRAVENOUS

## 2013-09-05 MED ORDER — DIPHENHYDRAMINE HCL 50 MG/ML IJ SOLN
INTRAMUSCULAR | Status: AC
  Filled 2013-09-05: qty 1

## 2013-09-05 MED ORDER — BUTAMBEN-TETRACAINE-BENZOCAINE 2-2-14 % EX AERO
INHALATION_SPRAY | CUTANEOUS | Status: DC | PRN
  Administered 2013-09-05: 2 via TOPICAL

## 2013-09-05 MED ORDER — MIDAZOLAM HCL 10 MG/2ML IJ SOLN
INTRAMUSCULAR | Status: DC | PRN
  Administered 2013-09-05 (×2): 2 mg via INTRAVENOUS

## 2013-09-05 MED ORDER — FENTANYL CITRATE 0.05 MG/ML IJ SOLN
INTRAMUSCULAR | Status: AC
  Filled 2013-09-05: qty 2

## 2013-09-05 MED ORDER — MIDAZOLAM HCL 5 MG/ML IJ SOLN
INTRAMUSCULAR | Status: AC
  Filled 2013-09-05: qty 2

## 2013-09-05 NOTE — CV Procedure (Signed)
Procedure: TEE  Indication: Mitral valve prolapse with at least moderate on TTE.   Sedation: Versed 4 mg IV, Fentanyl 50 mcg IV  Findings: Please see echo section for full report.  Normal LV size and systolic function, EF 49%.  Normal RV size and systolic function.  Mild LAE.  No LAA thrombus.  The mitral valve was thickened and myxomatous.  There was bileaflet prolapse.  There was moderate to severe mitral regurgitation (moderate by PISA). MR was relatively central with several jets in close proximity.  No pulmonary vein systolic flow reversal.  Negative bubble study.   Impression: Moderate to severe mitral regurgitation (moderate by PISA) with MVP and preserved LV size and EF.  Would follow for now.  Repeat echo in 1 year unless she develops significant exertional dyspnea.  We will get her set up to follow in our office.   Cristina Garcia 09/05/2013

## 2013-09-05 NOTE — H&P (Signed)
Physician History and Physical    Cristina Garcia MRN: [id number] DOB/AGE: [birth year] 53 y.o. Admit date: 09/05/2013  Primary Care Physician: Dr. Sherren Mocha  HPI: Patient has been followed by PCP for mitral valve prolapse.  Murmur was noted to be louder, echo showed at least moderate MR, concern for possibly more severe.  She was set up for TEE.   Review of systems complete and found to be negative unless listed above   Family History  Problem Relation Age of Onset  . Diabetes      family hx  . Hypertension      family hx  . Lymphoma      family hx  . Colon cancer Neg Hx   . Stomach cancer Neg Hx   . Hodgkin's lymphoma Mother     Non hodgkins lymphoma    History   Social History  . Marital Status: Married    Spouse Name: N/A    Number of Children: N/A  . Years of Education: N/A   Occupational History  . Not on file.   Social History Main Topics  . Smoking status: Never Smoker   . Smokeless tobacco: Never Used  . Alcohol Use: 2.4 oz/week    4 Glasses of wine per week  . Drug Use: No  . Sexual Activity: Not on file   Other Topics Concern  . Not on file   Social History Narrative  . No narrative on file     Prescriptions prior to admission  Medication Sig Dispense Refill  . aspirin 81 MG tablet Take 81 mg by mouth daily.        . calcium carbonate (OS-CAL) 600 MG TABS Take 600 mg by mouth 2 (two) times daily with a meal.        . conjugated estrogens (PREMARIN) vaginal cream Place vaginally 2 (two) times a week.  42.5 g  11  . lisinopril (PRINIVIL,ZESTRIL) 5 MG tablet TAKE 1 TABLET IN THE MORNING  90 tablet  3  . Multiple Vitamins-Minerals (MULTIVITAL PO) Take 1 tablet by mouth daily.        Marland Kitchen HYDROcodone-homatropine (HYCODAN) 5-1.5 MG/5ML syrup Take 5 mLs by mouth every 8 (eight) hours as needed for cough.  120 mL  0  . predniSONE (DELTASONE) 20 MG tablet 2 tabs x 3 days, 1 x 3, 1/2 x 3, 1/2 x M W F for 2 week taper  40 tablet  1    Physical Exam: Blood  pressure 146/80, pulse 72, temperature 97.6 F (36.4 C), temperature source Oral, resp. rate 13, height 5\' 9"  (1.753 m), weight 58.968 kg (130 lb), SpO2 100.00%.  General: NAD Neck: No JVD, no thyromegaly or thyroid nodule.  Lungs: Clear to auscultation bilaterally with normal respiratory effort. CV: Nondisplaced PMI.  Heart regular S1/S2, no S3/S4, 3/6 HSM apex.  No peripheral edema.  No carotid bruit.  Normal pedal pulses.  Abdomen: Soft, nontender, no hepatosplenomegaly, no distention.  Skin: Intact without lesions or rashes.  Neurologic: Alert and oriented x 3.  Psych: Normal affect. Extremities: No clubbing or cyanosis.  HEENT: Normal.   Labs:   Lab Results  Component Value Date   WBC 10.2 07/31/2013   HGB 15.6 07/31/2013   HCT 46.2 07/31/2013   MCV 96.2 07/31/2013   PLT 199 07/31/2013   No results found for this basename: NA, K, CL, CO2, BUN, CREATININE, CALCIUM, LABALBU, PROT, BILITOT, ALKPHOS, ALT, AST, GLUCOSE,  in the last 168 hours No results  found for this basename: CKTOTAL, CKMB, CKMBINDEX, TROPONINI    Lab Results  Component Value Date   CHOL 208* 08/13/2010   CHOL 195 10/30/2008   Lab Results  Component Value Date   HDL 91.90 08/13/2010   HDL 76.90 10/30/2008   Lab Results  Component Value Date   LDLCALC 108* 10/30/2008   Lab Results  Component Value Date   TRIG 50.0 08/13/2010   TRIG 51.0 10/30/2008   Lab Results  Component Value Date   CHOLHDL 2 08/13/2010   CHOLHDL 3 10/30/2008   Lab Results  Component Value Date   LDLDIRECT 104.4 08/13/2010      ASSESSMENT AND PLAN:  TEE today to investigate MVP/MR.   Signed: [name] 09/05/2013, 11:47 AM

## 2013-09-05 NOTE — Progress Notes (Signed)
Echocardiogram Echocardiogram Transesophageal has been performed.  Cristina Garcia 09/05/2013, 11:40 AM

## 2013-09-06 ENCOUNTER — Encounter (HOSPITAL_COMMUNITY): Payer: Self-pay | Admitting: Cardiology

## 2013-09-07 ENCOUNTER — Telehealth: Payer: Self-pay | Admitting: Family Medicine

## 2013-09-07 NOTE — Telephone Encounter (Signed)
Pt states she had a tee-test performed on this past Wednesday and is asking for additional information regarding this procedure, when and how she will get the results.

## 2013-09-07 NOTE — Telephone Encounter (Signed)
Left message on machine for patient to give her cardiologist a call

## 2013-09-24 ENCOUNTER — Telehealth: Payer: Self-pay | Admitting: Cardiology

## 2013-09-24 NOTE — Telephone Encounter (Signed)
Spoke with patient about repeat echo in 1 year.

## 2013-09-24 NOTE — Telephone Encounter (Signed)
New Prob     Pt has some questions regarding possible date for valve repair surgery. Please call.

## 2013-10-30 ENCOUNTER — Encounter: Payer: Self-pay | Admitting: *Deleted

## 2013-11-02 ENCOUNTER — Other Ambulatory Visit: Payer: Self-pay | Admitting: Family Medicine

## 2013-11-08 ENCOUNTER — Encounter: Payer: Self-pay | Admitting: Cardiology

## 2013-11-08 ENCOUNTER — Ambulatory Visit (INDEPENDENT_AMBULATORY_CARE_PROVIDER_SITE_OTHER): Payer: 59 | Admitting: Cardiology

## 2013-11-08 VITALS — BP 110/60 | HR 73 | Ht 69.5 in | Wt 129.0 lb

## 2013-11-08 DIAGNOSIS — I340 Nonrheumatic mitral (valve) insufficiency: Secondary | ICD-10-CM

## 2013-11-08 DIAGNOSIS — I059 Rheumatic mitral valve disease, unspecified: Secondary | ICD-10-CM

## 2013-11-08 NOTE — Patient Instructions (Signed)
Your physician wants you to follow-up in: 1 year with Dr Aundra Dubin. (May 2016). You will receive a reminder letter in the mail two months in advance. If you don't receive a letter, please call our office to schedule the follow-up appointment.   Your physician has requested that you have an echocardiogram. Echocardiography is a painless test that uses sound waves to create images of your heart. It provides your doctor with information about the size and shape of your heart and how well your heart's chambers and valves are working. This procedure takes approximately one hour. There are no restrictions for this procedure. A FEW DAYS BEFORE YOUR APPT WITH DR [facility] IN 1 YEAR

## 2013-11-09 DIAGNOSIS — I340 Nonrheumatic mitral (valve) insufficiency: Secondary | ICD-10-CM | POA: Insufficient documentation

## 2013-11-09 NOTE — Progress Notes (Signed)
Patient ID: Cristina Garcia, female   DOB: 30-Apr-1961, 53 y.o.   MRN: [id number] PCP: Dr. Sherren Mocha  53 yo with mitral valve prolapse and moderate mitral regurgitation presents for cardiology evaluation.  Patient has had a long history of murmur and has been told for years that she has mitral valve prolapse.  No history of this in her family though her mother had rheumatic fever and a mitral valve operation.  Her PCP sent her for an echo in 1/15, which showed bileaflet prolapse and probably moderate MR.  TEE was recommended.  This was done in 3/15 and showed moderate to severe MR from bileaflet mitral valve prolapse with normal LV size and no pulmonary vein systolic doppler flow reversal.  She has excellent exercise tolerance with 30 minutes exercise/day (usually walking). No exertional dyspnea, chest pain, palpitations.  No lightheadedness.  She feels good generally though she has a lot of stress at work.   ECG: NSR, LVH  Labs (2/15): creatinine 1.18  PMH: 1. Mitral valve prolapse: Echo (1/15) with EF 55-60%, grade II diastolic dysfunction, bileaflet MV prolapse, moderate MR.  TEE (3/15) with EF 60%, normal RV size and systolic function, bileaflet MV prolapse, moderate to severe MR (moderate by PISA), no pulmonary vein systolic flow reversal.  2. Multiple myeloma: s/p chemotherapy and autologous stem cell transplant at [facility] in 6/08, clinical remission.  3. CKD 2/2 multiple myeloma 4. H/o rectal bleeding with negative colonoscopy in 3/13.  5. Osteopenia  SH: Married, nonsmoker, works for [employer].  2 kids.   FH: HTN, lymphoma, diabetes. Mother with rheumatic fever and had MV replacement.   ROS: All systems reviewed and negative except as per HPI.   Current Outpatient Prescriptions  Medication Sig Dispense Refill  . aspirin 81 MG tablet Take 81 mg by mouth daily.        . calcium carbonate (OS-CAL) 600 MG TABS Take 600 mg by mouth 2 (two) times daily with a meal.        .  conjugated estrogens (PREMARIN) vaginal cream Place vaginally 2 (two) times a week.  42.5 g  11  . lisinopril (PRINIVIL,ZESTRIL) 5 MG tablet TAKE 1 TABLET IN THE MORNING  90 tablet  3  . Multiple Vitamins-Minerals (MULTIVITAL PO) Take 1 tablet by mouth daily.        . predniSONE (DELTASONE) 20 MG tablet 2 tabs x 3 days, 1 x 3, 1/2 x 3, 1/2 x M W F for 2 week taper  40 tablet  1   No current facility-administered medications for this visit.    BP 110/60  Pulse 73  Ht 5' 9.5" (1.765 m)  Wt 58.514 kg (129 lb)  BMI 18.78 kg/m2 General: NAD Neck: No JVD, no thyromegaly or thyroid nodule.  Lungs: Clear to auscultation bilaterally with normal respiratory effort. CV: Nondisplaced PMI.  Heart regular S1/S2, no L8/G5, mid systolic click with 3/6 late systolic murmur at apex.  No peripheral edema.  No carotid bruit.  Normal pedal pulses.  Abdomen: Soft, nontender, no hepatosplenomegaly, no distention.  Skin: Intact without lesions or rashes.  Neurologic: Alert and oriented x 3.  Psych: Normal affect. Extremities: No clubbing or cyanosis.   Assessment/Plan: 53 yo with moderate to severe mitral regurgitation due to bileaflet mitral valve prolapse.  No flail segment.  She is asymptomatic.  The LV is not dilated and there is no evidence for pulmonary hypertension.  No history of atrial fibrillation.  For now, I would observe.  She knows to call if she notes any new exercise intolerance.  I will repeat an echo in 3/16 and have her followup in the office after that.   Larey Dresser 11/09/2013

## 2013-11-12 ENCOUNTER — Telehealth: Payer: Self-pay | Admitting: Family Medicine

## 2013-11-12 DIAGNOSIS — I868 Varicose veins of other specified sites: Secondary | ICD-10-CM

## 2013-11-12 MED ORDER — LORAZEPAM 0.5 MG PO TABS: 0.5000 mg | ORAL_TABLET | Freq: Every evening | ORAL | Status: DC | PRN

## 2013-11-12 NOTE — Telephone Encounter (Signed)
Pt states she has been having a lot of anxiety issues because of transitioning from one job to another, wants to know if dr. Sherren Garcia will call her in something to take without being seen right now due to work schedule conflicts.

## 2013-11-12 NOTE — Telephone Encounter (Signed)
Rx Called in and patient is aware.  Patient would like to know what can she do about spider veins?

## 2013-11-13 NOTE — Telephone Encounter (Signed)
Dr Kellie Simmering per Dr Sherren Mocha. Left message on machine for patient and referral placed.

## 2013-12-25 ENCOUNTER — Ambulatory Visit (HOSPITAL_BASED_OUTPATIENT_CLINIC_OR_DEPARTMENT_OTHER): Payer: 59

## 2013-12-25 ENCOUNTER — Telehealth: Payer: Self-pay | Admitting: *Deleted

## 2013-12-25 DIAGNOSIS — N289 Disorder of kidney and ureter, unspecified: Secondary | ICD-10-CM

## 2013-12-25 DIAGNOSIS — C9001 Multiple myeloma in remission: Secondary | ICD-10-CM

## 2013-12-25 LAB — CBC WITH DIFFERENTIAL/PLATELET
BASO%: 0.8 % (ref 0.0–2.0)
Basophils Absolute: 0 10*3/uL (ref 0.0–0.1)
EOS%: 0.9 % (ref 0.0–7.0)
Eosinophils Absolute: 0 10*3/uL (ref 0.0–0.5)
HCT: 44.2 % (ref 34.8–46.6)
HGB: 14.9 g/dL (ref 11.6–15.9)
LYMPH%: 23.4 % (ref 14.0–49.7)
MCH: 33 pg (ref 25.1–34.0)
MCHC: 33.7 g/dL (ref 31.5–36.0)
MCV: 97.7 fL (ref 79.5–101.0)
MONO#: 0.5 10*3/uL (ref 0.1–0.9)
MONO%: 9.3 % (ref 0.0–14.0)
NEUT#: 3.4 10*3/uL (ref 1.5–6.5)
NEUT%: 65.6 % (ref 38.4–76.8)
PLATELETS: 158 10*3/uL (ref 145–400)
RBC: 4.52 10*6/uL (ref 3.70–5.45)
RDW: 12.7 % (ref 11.2–14.5)
WBC: 5.2 10*3/uL (ref 3.9–10.3)
lymph#: 1.2 10*3/uL (ref 0.9–3.3)

## 2013-12-25 LAB — COMPREHENSIVE METABOLIC PANEL (CC13)
ALK PHOS: 56 U/L (ref 40–150)
ALT: 18 U/L (ref 0–55)
AST: 23 U/L (ref 5–34)
Albumin: 4 g/dL (ref 3.5–5.0)
Anion Gap: 7 mEq/L (ref 3–11)
BUN: 23.3 mg/dL (ref 7.0–26.0)
CO2: 25 mEq/L (ref 22–29)
CREATININE: 1.2 mg/dL — AB (ref 0.6–1.1)
Calcium: 9.5 mg/dL (ref 8.4–10.4)
Chloride: 107 mEq/L (ref 98–109)
Glucose: 91 mg/dl (ref 70–140)
Potassium: 4.2 mEq/L (ref 3.5–5.1)
SODIUM: 139 meq/L (ref 136–145)
TOTAL PROTEIN: 6.7 g/dL (ref 6.4–8.3)
Total Bilirubin: 0.96 mg/dL (ref 0.20–1.20)

## 2013-12-25 NOTE — Telephone Encounter (Signed)
Message from pt reporting unusual bruising. Bruise under eye, behind knee with no injury to explain them. Noticing pain more frequently in L pelvis. Had pain there prior to diagnosis that resolved after chemo. Recently changed positions at work, unsure if this is due to sitting in same position for long hours daily. Requests lab appt. Reviewed with Dr. Benay Spice: Order received for lab today. Called pt with appt for 3:30, requested she wait for results. Pt voiced understanding.

## 2013-12-25 NOTE — Telephone Encounter (Signed)
Spoke with pt in lobby, CBC normal. She voiced understanding. Noted faint linear bruise under R eye. Will await results of SPEP and Light chains. If normal may not need lab prior to office in Aug.

## 2013-12-27 LAB — PROTEIN ELECTROPHORESIS, SERUM
ALPHA-1-GLOBULIN: 4 % (ref 2.9–4.9)
Albumin ELP: 66.7 % — ABNORMAL HIGH (ref 55.8–66.1)
Alpha-2-Globulin: 9.1 % (ref 7.1–11.8)
BETA GLOBULIN: 5.4 % (ref 4.7–7.2)
Beta 2: 3.6 % (ref 3.2–6.5)
Gamma Globulin: 11.2 % (ref 11.1–18.8)
TOTAL PROTEIN, SERUM ELECTROPHOR: 6.1 g/dL (ref 6.0–8.3)

## 2013-12-27 LAB — KAPPA/LAMBDA LIGHT CHAINS
KAPPA LAMBDA RATIO: 0.99 (ref 0.26–1.65)
Kappa free light chain: 1.08 mg/dL (ref 0.33–1.94)
Lambda Free Lght Chn: 1.09 mg/dL (ref 0.57–2.63)

## 2013-12-28 ENCOUNTER — Telehealth: Payer: Self-pay | Admitting: *Deleted

## 2013-12-28 NOTE — Telephone Encounter (Signed)
Message copied by Brien Few on Fri Dec 28, 2013  2:26 PM ------      Message from: [name] B      Created: Thu Dec 27, 2013  9:11 PM       Please call patient, labs are stable, f/u as scheduled, call for persistent pain and we will order xrays ------

## 2013-12-28 NOTE — Telephone Encounter (Signed)
Called pt with lab results: Stable, per Dr. Benay Spice. Follow up as scheduled. Call for persistent pain and we'll order Xrays. Pt voiced understanding. Thinks pain may be related to recent inactivity, working longer hours. Lab appt canceled for August (done early).

## 2014-01-03 ENCOUNTER — Other Ambulatory Visit: Payer: Self-pay | Admitting: *Deleted

## 2014-01-03 DIAGNOSIS — I83893 Varicose veins of bilateral lower extremities with other complications: Secondary | ICD-10-CM

## 2014-01-22 ENCOUNTER — Encounter: Payer: 59 | Admitting: Vascular Surgery

## 2014-01-22 ENCOUNTER — Encounter (HOSPITAL_COMMUNITY): Payer: 59

## 2014-02-01 ENCOUNTER — Other Ambulatory Visit: Payer: 59

## 2014-02-01 ENCOUNTER — Telehealth: Payer: Self-pay | Admitting: Oncology

## 2014-02-01 ENCOUNTER — Ambulatory Visit (HOSPITAL_BASED_OUTPATIENT_CLINIC_OR_DEPARTMENT_OTHER): Payer: 59 | Admitting: Oncology

## 2014-02-01 VITALS — BP 137/77 | HR 73 | Temp 98.6°F | Resp 18 | Ht 69.0 in | Wt 124.7 lb

## 2014-02-01 DIAGNOSIS — N289 Disorder of kidney and ureter, unspecified: Secondary | ICD-10-CM

## 2014-02-01 DIAGNOSIS — M949 Disorder of cartilage, unspecified: Secondary | ICD-10-CM

## 2014-02-01 DIAGNOSIS — C9001 Multiple myeloma in remission: Secondary | ICD-10-CM

## 2014-02-01 DIAGNOSIS — M899 Disorder of bone, unspecified: Secondary | ICD-10-CM

## 2014-02-01 NOTE — Progress Notes (Signed)
  Cristina Garcia OFFICE PROGRESS NOTE   Diagnosis: Multiple myeloma  INTERVAL HISTORY:   She returned early for her labs last month when she developed easy bruising and increased pain at the left iliac. She reports easy bruising with trauma. She had increased pain in the left pelvis after sitting for prolonged periods of time at work. This has improved. No other sites of pain. She is working and walking daily.  Objective:  Vital signs in last 24 hours:  Blood pressure 137/77, pulse 73, temperature 98.6 F (37 C), temperature source Oral, resp. rate 18, height $RemoveBe'5\' 9"'cDrSITLIH$  (1.753 m), weight 124 lb 11.2 oz (56.564 kg), SpO2 99.00%.     Resp: Lungs clear bilaterally Cardio: Regular rate and rhythm, systolic QIONG,2/9 systolic murmur GI: No hepatosplenomegaly Vascular: No leg edema Musculoskeletal: Mild tenderness at the bilateral iliac. No pain with motion at either hip    Lab Results:  Lab Results  Component Value Date   WBC 5.2 12/25/2013   HGB 14.9 12/25/2013   HCT 44.2 12/25/2013   MCV 97.7 12/25/2013   PLT 158 12/25/2013   NEUTROABS 3.4 12/25/2013   Potassium 4.2, creatinine 1.2, calcium 9.5  Serum M spike-not detected Kappa free light chains 1.08, lambda free light chains 1.09   Medications: I have reviewed the patient's current medications.  Assessment/Plan: 1. Multiple myeloma, IgA kappa, status post high-dose chemotherapy with autologous stem cell support at [facility] in June 2008. She remains in clinical remission. 2. Renal insufficiency secondary to multiple myeloma.  3. History of hypokalemia. 4. History of left pelvic pain secondary to a lytic left iliac lesion. A metastatic bone survey was unchanged in August of 2012. 5. Simple cyst at the lower pole of the right kidney on a renal ultrasound, 11/08/2006. 6. Multiseptated cyst of the left adnexa on an ultrasound, 11/08/2006. 7. Osteopenia 8. Heart murmur.  9. Report of intermittent rectal bleeding. Normal  colonoscopy by Dr. Fuller Plan on 08/20/2011. 10. Cough, persistent over several weeks. Question viral URI, question pneumonia.   Disposition:  She appears well today. No clinical evidence for progression of the multiple myeloma. She will contact us for increased pain. She will return for an office visit, metastatic bone survey, and myeloma panel in 6 months. She will remain up-to-date on the influenza and pneumococcal vaccines. She last had a pneumococcal vaccine in February of 2011.  [name], MD  02/01/2014  9:42 AM

## 2014-02-01 NOTE — Telephone Encounter (Signed)
Pt confirmed labs/ov per 08/21 POF, gave pt AVS....KJ

## 2014-03-04 ENCOUNTER — Telehealth: Payer: Self-pay | Admitting: Family Medicine

## 2014-03-04 NOTE — Telephone Encounter (Signed)
Okay to schedule per Dr Sherren Mocha

## 2014-03-04 NOTE — Telephone Encounter (Signed)
Pt called and would like to know if Dr Sherren Mocha would accept her daughter as a new pt. The daughter use to be on vyvanse and is requesting to get back on it per Mom.

## 2014-03-06 NOTE — Telephone Encounter (Signed)
Lmovm to c/b

## 2014-06-18 ENCOUNTER — Other Ambulatory Visit (INDEPENDENT_AMBULATORY_CARE_PROVIDER_SITE_OTHER): Payer: 59

## 2014-06-18 DIAGNOSIS — Z0000 Encounter for general adult medical examination without abnormal findings: Secondary | ICD-10-CM

## 2014-06-18 LAB — HEPATIC FUNCTION PANEL
ALT: 19 U/L (ref 0–35)
AST: 30 U/L (ref 0–37)
Albumin: 4.6 g/dL (ref 3.5–5.2)
Alkaline Phosphatase: 61 U/L (ref 39–117)
BILIRUBIN DIRECT: 0.2 mg/dL (ref 0.0–0.3)
Total Bilirubin: 1.3 mg/dL — ABNORMAL HIGH (ref 0.2–1.2)
Total Protein: 7.3 g/dL (ref 6.0–8.3)

## 2014-06-18 LAB — BASIC METABOLIC PANEL
BUN: 20 mg/dL (ref 6–23)
CO2: 28 mEq/L (ref 19–32)
CREATININE: 1.2 mg/dL (ref 0.4–1.2)
Calcium: 10 mg/dL (ref 8.4–10.5)
Chloride: 100 mEq/L (ref 96–112)
GFR: 49.55 mL/min — ABNORMAL LOW (ref >60.00)
Glucose, Bld: 72 mg/dL (ref 70–99)
POTASSIUM: 4.1 meq/L (ref 3.5–5.1)
SODIUM: 137 meq/L (ref 135–145)

## 2014-06-18 LAB — POCT URINALYSIS DIPSTICK
Bilirubin, UA: NEGATIVE
Blood, UA: NEGATIVE
GLUCOSE UA: NEGATIVE
Ketones, UA: NEGATIVE
Leukocytes, UA: NEGATIVE
NITRITE UA: NEGATIVE
PROTEIN UA: NEGATIVE
UROBILINOGEN UA: 0.2
pH, UA: 6.5

## 2014-06-18 LAB — CBC WITH DIFFERENTIAL/PLATELET
BASOS ABS: 0 10*3/uL (ref 0.0–0.1)
Basophils Relative: 0.4 % (ref 0.0–3.0)
EOS ABS: 0.1 10*3/uL (ref 0.0–0.7)
Eosinophils Relative: 1.8 % (ref 0.0–5.0)
HCT: 47.6 % — ABNORMAL HIGH (ref 36.0–46.0)
HEMOGLOBIN: 16 g/dL — AB (ref 12.0–15.0)
Lymphocytes Relative: 34.7 % (ref 12.0–46.0)
Lymphs Abs: 1.7 10*3/uL (ref 0.7–4.0)
MCHC: 33.7 g/dL (ref 30.0–36.0)
MCV: 97.4 fl (ref 78.0–100.0)
MONO ABS: 0.7 10*3/uL (ref 0.1–1.0)
Monocytes Relative: 13.8 % — ABNORMAL HIGH (ref 3.0–12.0)
NEUTROS PCT: 49.3 % (ref 43.0–77.0)
Neutro Abs: 2.4 10*3/uL (ref 1.4–7.7)
PLATELETS: 156 10*3/uL (ref 150.0–400.0)
RBC: 4.89 Mil/uL (ref 3.87–5.11)
RDW: 12.8 % (ref 11.5–15.5)
WBC: 4.8 10*3/uL (ref 4.0–10.5)

## 2014-06-18 LAB — LIPID PANEL
CHOLESTEROL: 245 mg/dL — AB (ref 0–200)
HDL: 90.4 mg/dL (ref >39.00)
LDL Cholesterol: 141 mg/dL — ABNORMAL HIGH (ref 0–99)
NONHDL: 154.6
Total CHOL/HDL Ratio: 3
Triglycerides: 69 mg/dL (ref 0.0–149.0)
VLDL: 13.8 mg/dL (ref 0.0–40.0)

## 2014-06-18 LAB — TSH: TSH: 3.45 u[IU]/mL (ref 0.35–4.50)

## 2014-06-24 ENCOUNTER — Encounter: Payer: Self-pay | Admitting: Family Medicine

## 2014-06-24 ENCOUNTER — Ambulatory Visit (INDEPENDENT_AMBULATORY_CARE_PROVIDER_SITE_OTHER): Payer: 59 | Admitting: Family Medicine

## 2014-06-24 VITALS — BP 110/80 | Temp 98.5°F | Ht 68.75 in | Wt 127.0 lb

## 2014-06-24 DIAGNOSIS — Z23 Encounter for immunization: Secondary | ICD-10-CM

## 2014-06-24 DIAGNOSIS — C9001 Multiple myeloma in remission: Secondary | ICD-10-CM

## 2014-06-24 DIAGNOSIS — N952 Postmenopausal atrophic vaginitis: Secondary | ICD-10-CM

## 2014-06-24 DIAGNOSIS — I10 Essential (primary) hypertension: Secondary | ICD-10-CM

## 2014-06-24 DIAGNOSIS — I341 Nonrheumatic mitral (valve) prolapse: Secondary | ICD-10-CM

## 2014-06-24 MED ORDER — LISINOPRIL 5 MG PO TABS: ORAL_TABLET | ORAL | Status: DC

## 2014-06-24 MED ORDER — ESTROGENS, CONJUGATED 0.625 MG/GM VA CREA: TOPICAL_CREAM | VAGINAL | Status: DC

## 2014-06-24 NOTE — Progress Notes (Signed)
Pre visit review using our clinic review tool, if applicable. No additional management support is needed unless otherwise documented below in the visit note. 

## 2014-06-24 NOTE — Patient Instructions (Signed)
Continue current medications  Follow-up in one year sooner if any problems  Call the breast center today and get set up for mammogram

## 2014-06-24 NOTE — Progress Notes (Signed)
   Subjective:    Patient ID: Cristina Garcia, female    DOB: 17-Dec-1960, 54 y.o.   MRN: [id number]  HPI Janara is a 54 year old married female nonsmoker who comes in today for general physical examination because of a history of multiple myeloma  It's been about 10 years ago since she came in with nonspecific pain it turned out she had multiple myeloma underwent a bone marrow transplant and is done well and no recurrences.  We saw her a while back for general physical noticed a heart murmur. Echo showed mitral valve disease. She is due to go back and see [name] in May for recheck. Her murmur is on 3-4 scale female. Cardiac wise she is asymptomatic exercise tolerance normal  She gets routine eye care, dental care, does not do BSE monthly, last mammogram 2012...Marland KitchenMarland KitchenMarland Kitchen encouraged annual mammography........ she's had bilateral breast implants  Colonoscopy 2013 reported to be normal  Vaccinations updated by Apolonio Schneiders   Review of Systems  Constitutional: Negative.   HENT: Negative.   Eyes: Negative.   Respiratory: Negative.   Cardiovascular: Negative.   Gastrointestinal: Negative.   Endocrine: Negative.   Genitourinary: Negative.   Musculoskeletal: Negative.   Skin: Negative.   Allergic/Immunologic: Negative.   Neurological: Negative.   Hematological: Negative.   Psychiatric/Behavioral: Negative.        Objective:   Physical Exam  Constitutional: She appears well-developed and well-nourished.  HENT:  Head: Normocephalic and atraumatic.  Right Ear: External ear normal.  Left Ear: External ear normal.  Nose: Nose normal.  Mouth/Throat: Oropharynx is clear and moist.  Eyes: Conjunctivae and EOM are normal. Pupils are equal, round, and reactive to light.  Neck: Normal range of motion. Neck supple. No JVD present. No tracheal deviation present. No thyromegaly present.  Cardiovascular: Normal rate, regular rhythm and intact distal pulses.  Exam reveals no gallop and no friction  rub.   Murmur heard. Grade 3/6 systolic ejection murmur heard best at the lower left sternal border consistent with MR asymptomatic  Pulmonary/Chest: Effort normal and breath sounds normal. No stridor. No respiratory distress. She has no wheezes. She has no rales. She exhibits no tenderness.  Abdominal: Soft. Bowel sounds are normal. She exhibits no distension and no mass. There is no tenderness. There is no rebound and no guarding.  Genitourinary:  Bilateral breast exam shows scars below the areola at the 6:00 position from previous rest implants no palpable masses  Pelvic done last year normal therefore recommend every 3 years. She is asymptomatic  Musculoskeletal: Normal range of motion.  Lymphadenopathy:    She has no cervical adenopathy.  Neurological: She is alert. She has normal reflexes. No cranial nerve deficit. She exhibits normal muscle tone. Coordination normal.  Skin: Skin is warm and dry. No rash noted. No erythema. No pallor.  Total body skin exam  Psychiatric: She has a normal mood and affect. Her behavior is normal. Judgment and thought content normal.  Nursing note and vitals reviewed.         Assessment & Plan:    Healthy female  Status post BMT for multiple myeloma  Status post bilateral breast implants  Mitral regurg murmur level III continue close follow-up

## 2014-07-28 ENCOUNTER — Other Ambulatory Visit: Payer: Self-pay | Admitting: Family Medicine

## 2014-07-30 ENCOUNTER — Other Ambulatory Visit (HOSPITAL_BASED_OUTPATIENT_CLINIC_OR_DEPARTMENT_OTHER): Payer: 59

## 2014-07-30 ENCOUNTER — Ambulatory Visit (HOSPITAL_COMMUNITY)
Admission: RE | Admit: 2014-07-30 | Discharge: 2014-07-30 | Disposition: A | Discharge status: Home / Self Care | Payer: 59 | Source: Ambulatory Visit | Attending: Oncology | Admitting: Oncology

## 2014-07-30 DIAGNOSIS — C9001 Multiple myeloma in remission: Secondary | ICD-10-CM | POA: Diagnosis not present

## 2014-07-30 LAB — COMPREHENSIVE METABOLIC PANEL (CC13)
ALK PHOS: 58 U/L (ref 40–150)
ALT: 18 U/L (ref 0–55)
AST: 23 U/L (ref 5–34)
Albumin: 4.1 g/dL (ref 3.5–5.0)
Anion Gap: 11 mEq/L (ref 3–11)
BUN: 20.5 mg/dL (ref 7.0–26.0)
CO2: 25 meq/L (ref 22–29)
Calcium: 10.1 mg/dL (ref 8.4–10.4)
Chloride: 105 mEq/L (ref 98–109)
Creatinine: 1.2 mg/dL — ABNORMAL HIGH (ref 0.6–1.1)
EGFR: 49 mL/min/{1.73_m2} — ABNORMAL LOW (ref >90)
Glucose: 99 mg/dl (ref 70–140)
POTASSIUM: 4.1 meq/L (ref 3.5–5.1)
SODIUM: 141 meq/L (ref 136–145)
Total Bilirubin: 1 mg/dL (ref 0.20–1.20)
Total Protein: 6.7 g/dL (ref 6.4–8.3)

## 2014-07-30 LAB — CBC WITH DIFFERENTIAL/PLATELET
BASO%: 1.3 % (ref 0.0–2.0)
Basophils Absolute: 0.1 10*3/uL (ref 0.0–0.1)
EOS%: 1.7 % (ref 0.0–7.0)
Eosinophils Absolute: 0.1 10*3/uL (ref 0.0–0.5)
HCT: 46.6 % (ref 34.8–46.6)
HGB: 15.4 g/dL (ref 11.6–15.9)
LYMPH#: 1.4 10*3/uL (ref 0.9–3.3)
LYMPH%: 35.3 % (ref 14.0–49.7)
MCH: 32.2 pg (ref 25.1–34.0)
MCHC: 33 g/dL (ref 31.5–36.0)
MCV: 97.6 fL (ref 79.5–101.0)
MONO#: 0.4 10*3/uL (ref 0.1–0.9)
MONO%: 10.9 % (ref 0.0–14.0)
NEUT#: 2.1 10*3/uL (ref 1.5–6.5)
NEUT%: 50.8 % (ref 38.4–76.8)
Platelets: 165 10*3/uL (ref 145–400)
RBC: 4.78 10*6/uL (ref 3.70–5.45)
RDW: 12.9 % (ref 11.2–14.5)
WBC: 4 10*3/uL (ref 3.9–10.3)

## 2014-08-01 LAB — PROTEIN ELECTROPHORESIS, SERUM
ALBUMIN ELP: 67.7 % — AB (ref 55.8–66.1)
ALPHA-1-GLOBULIN: 3.9 % (ref 2.9–4.9)
Alpha-2-Globulin: 9.1 % (ref 7.1–11.8)
Beta 2: 3.1 % — ABNORMAL LOW (ref 3.2–6.5)
Beta Globulin: 5.6 % (ref 4.7–7.2)
Gamma Globulin: 10.6 % — ABNORMAL LOW (ref 11.1–18.8)
Total Protein, Serum Electrophoresis: 6.6 g/dL (ref 6.0–8.3)

## 2014-08-01 LAB — KAPPA/LAMBDA LIGHT CHAINS
Kappa free light chain: 1.82 mg/dL (ref 0.33–1.94)
Kappa:Lambda Ratio: 1.82 — ABNORMAL HIGH (ref 0.26–1.65)
LAMBDA FREE LGHT CHN: 1 mg/dL (ref 0.57–2.63)

## 2014-08-08 ENCOUNTER — Telehealth: Payer: Self-pay | Admitting: Oncology

## 2014-08-08 ENCOUNTER — Ambulatory Visit (HOSPITAL_BASED_OUTPATIENT_CLINIC_OR_DEPARTMENT_OTHER): Payer: 59 | Admitting: Oncology

## 2014-08-08 VITALS — BP 128/78 | HR 64 | Temp 98.5°F | Ht 68.75 in | Wt 128.0 lb

## 2014-08-08 DIAGNOSIS — Z23 Encounter for immunization: Secondary | ICD-10-CM

## 2014-08-08 DIAGNOSIS — C9001 Multiple myeloma in remission: Secondary | ICD-10-CM

## 2014-08-08 DIAGNOSIS — M8580 Other specified disorders of bone density and structure, unspecified site: Secondary | ICD-10-CM

## 2014-08-08 DIAGNOSIS — N289 Disorder of kidney and ureter, unspecified: Secondary | ICD-10-CM

## 2014-08-08 NOTE — Telephone Encounter (Signed)
Gave avs & calendar for April & Ocotober.

## 2014-08-08 NOTE — Progress Notes (Signed)
  [facility] OFFICE PROGRESS NOTE   Diagnosis: Multiple myeloma  INTERVAL HISTORY:   She returns as scheduled. She feels well. No recent infection. She continues to have discomfort at the left posterior iliac with physical activity. She worked in her garden last weekend and had a flare of this pain.  Objective:  Vital signs in last 24 hours:  Blood pressure 128/78, pulse 64, temperature 98.5 F (36.9 C), temperature source Oral, height 5' 8.75" (1.746 m), weight 128 lb (58.06 kg), SpO2 100 %.   Resp: Lungs clear bilaterally Cardio: Regular rate and rhythm, 2/6 systolic murmur, systolic click GI: No hepatosplenomegaly Vascular: No leg edema Musculoskeletal: No pain with motion at the left hip   Lab Results:  Lab Results  Component Value Date   WBC 4.0 07/30/2014   HGB 15.4 07/30/2014   HCT 46.6 07/30/2014   MCV 97.6 07/30/2014   PLT 165 07/30/2014   NEUTROABS 2.1 07/30/2014   potassium 4.1, BUN 20.5, creatinine 1.2 M spike-not detected, kappa free light chains 1.82, lambda free light chains 1.0  Imaging: Bone survey 07/30/2014-no change compared to 01/20/2011. Stable lesions in the left iliac bone and mid left humerus.  Medications: I have reviewed the patient's current medications.  Assessment/Plan: 1. Multiple myeloma, IgA kappa, status post high-dose chemotherapy with autologous stem cell support at [facility] in June 2008. She remains in clinical remission. 2. Renal insufficiency secondary to multiple myeloma.  3. History of hypokalemia. 4. History of left pelvic pain secondary to a lytic left iliac lesion. A metastatic bone survey was unchanged 07/30/2014 5. Simple cyst at the lower pole of the right kidney on a renal ultrasound, 11/08/2006. 6. Multiseptated cyst of the left adnexa on an ultrasound, 11/08/2006. 7. Osteopenia 8. Mitral valve prolapse/mitral regurgitation 9. Report of intermittent rectal bleeding. Normal colonoscopy by Dr. Fuller Plan on  08/20/2011.   Disposition:  Ms. Labrake remains in clinical remission from multiple myeloma. She will return for an office and lab visit in 8 months. We administered a 13 valent pneumococcal vaccine today. She will return for a 23 valent pneumococcal vaccine in 2 months.  [name], MD  08/08/2014  4:03 PM

## 2014-10-02 ENCOUNTER — Other Ambulatory Visit: Payer: Self-pay | Admitting: *Deleted

## 2014-10-02 DIAGNOSIS — C9001 Multiple myeloma in remission: Secondary | ICD-10-CM

## 2014-10-03 ENCOUNTER — Ambulatory Visit (HOSPITAL_BASED_OUTPATIENT_CLINIC_OR_DEPARTMENT_OTHER): Payer: 59

## 2014-10-03 VITALS — BP 118/79 | HR 80 | Temp 98.3°F

## 2014-10-03 DIAGNOSIS — Z23 Encounter for immunization: Secondary | ICD-10-CM | POA: Diagnosis not present

## 2014-10-03 DIAGNOSIS — C9001 Multiple myeloma in remission: Secondary | ICD-10-CM

## 2014-10-03 MED ORDER — PNEUMOCOCCAL VAC POLYVALENT 25 MCG/0.5ML IJ INJ
0.5000 mL | INJECTION | Freq: Once | INTRAMUSCULAR | Status: AC
  Administered 2014-10-03: 0.5 mL via INTRAMUSCULAR
  Filled 2014-10-03: qty 0.5

## 2014-10-25 ENCOUNTER — Encounter: Payer: Self-pay | Admitting: *Deleted

## 2014-11-05 ENCOUNTER — Ambulatory Visit (INDEPENDENT_AMBULATORY_CARE_PROVIDER_SITE_OTHER): Payer: 59 | Admitting: Family Medicine

## 2014-11-05 ENCOUNTER — Encounter: Payer: Self-pay | Admitting: Family Medicine

## 2014-11-05 VITALS — BP 110/70 | Temp 98.0°F | Wt 125.0 lb

## 2014-11-05 DIAGNOSIS — C9001 Multiple myeloma in remission: Secondary | ICD-10-CM

## 2014-11-05 DIAGNOSIS — M79672 Pain in left foot: Secondary | ICD-10-CM | POA: Diagnosis not present

## 2014-11-05 NOTE — Progress Notes (Signed)
   Subjective:    Patient ID: Cristina Garcia, female    DOB: [birth year], 54 y.o.   MRN: [id number]  HPI Cristina Garcia is a 54 year old married female nonsmoker who comes in today for evaluation of left foot pain for about 4 months  Stenosis in January or February. She points to the 4 toes on her left foot. She does not think the great toe is involved.  She's has multiple myeloma and is in remission. She had a bone marrow transplant. Because of the chemotherapy he she has some neuropathy.  No history of trauma   Review of Systems    review of systems otherwise negative Objective:   Physical Exam Well-developed well-nourished female no acute distress vital signs stable she's afebrile examination of left foot shows skin to me normal pulses are normal. There is no tenderness in the ventral portion of the foot. I cannot appreciate a neuroma. She is developing some slight hammertoes of those 4 toes       Assessment & Plan:  Left foot pain unknown etiology.......... consult with Wylene Simmer

## 2014-11-05 NOTE — Patient Instructions (Signed)
Call Dr. Wylene Simmer at [facility] orthopedics for further evaluation

## 2014-11-05 NOTE — Progress Notes (Signed)
Pre visit review using our clinic review tool, if applicable. No additional management support is needed unless otherwise documented below in the visit note. 

## 2014-11-14 ENCOUNTER — Other Ambulatory Visit: Payer: Self-pay

## 2014-11-14 ENCOUNTER — Ambulatory Visit (HOSPITAL_COMMUNITY): Payer: 59 | Attending: Cardiovascular Disease

## 2014-11-14 ENCOUNTER — Other Ambulatory Visit (HOSPITAL_COMMUNITY): Payer: 59

## 2014-11-14 DIAGNOSIS — I340 Nonrheumatic mitral (valve) insufficiency: Secondary | ICD-10-CM | POA: Diagnosis not present

## 2014-11-26 ENCOUNTER — Ambulatory Visit: Payer: 59 | Admitting: Cardiology

## 2014-12-10 ENCOUNTER — Ambulatory Visit: Payer: 59 | Admitting: Nurse Practitioner

## 2014-12-11 ENCOUNTER — Ambulatory Visit (INDEPENDENT_AMBULATORY_CARE_PROVIDER_SITE_OTHER): Payer: 59 | Admitting: Cardiology

## 2014-12-11 VITALS — BP 124/80 | HR 80 | Ht 68.75 in | Wt 124.0 lb

## 2014-12-11 DIAGNOSIS — M79672 Pain in left foot: Secondary | ICD-10-CM | POA: Diagnosis not present

## 2014-12-11 DIAGNOSIS — I340 Nonrheumatic mitral (valve) insufficiency: Secondary | ICD-10-CM | POA: Diagnosis not present

## 2014-12-11 DIAGNOSIS — I4719 Other supraventricular tachycardia: Secondary | ICD-10-CM

## 2014-12-11 DIAGNOSIS — I471 Supraventricular tachycardia: Secondary | ICD-10-CM | POA: Diagnosis not present

## 2014-12-11 LAB — BASIC METABOLIC PANEL
BUN: 20 mg/dL (ref 6–23)
CALCIUM: 9.8 mg/dL (ref 8.4–10.5)
CO2: 27 meq/L (ref 19–32)
Chloride: 104 mEq/L (ref 96–112)
Creatinine, Ser: 1.2 mg/dL (ref 0.40–1.20)
GFR: 49.46 mL/min — ABNORMAL LOW (ref >60.00)
Glucose, Bld: 81 mg/dL (ref 70–99)
Potassium: 4 mEq/L (ref 3.5–5.1)
SODIUM: 139 meq/L (ref 135–145)

## 2014-12-11 LAB — BRAIN NATRIURETIC PEPTIDE: Pro B Natriuretic peptide (BNP): 92 pg/mL (ref 0.0–100.0)

## 2014-12-11 MED ORDER — METOPROLOL SUCCINATE ER 25 MG PO TB24: 25.0000 mg | ORAL_TABLET | Freq: Every day | ORAL | Status: DC

## 2014-12-11 NOTE — Patient Instructions (Signed)
Medication Instructions:  Start Toprol XL 25mg  daily  Labwork: BMET/BNP today  Testing/Procedures: Your physician has requested that you have an exercise tolerance test. For further information please visit [email or web address]. Please also follow instruction sheet, as given.  Your physician has recommended that you wear an event monitor. Event monitors are medical devices that record the heart's electrical activity. Doctors most often [place] these monitors to diagnose arrhythmias. Arrhythmias are problems with the speed or rhythm of the heartbeat. The monitor is a small, portable device. You can wear one while you do your normal daily activities. This is usually used to diagnose what is causing palpitations/syncope (passing out). 30 day     Follow-Up: Your physician wants you to follow-up in: 6 months with Dr Aundra Dubin. (December 2016).You will receive a reminder letter in the mail two months in advance. If you don't receive a letter, please call our office to schedule the follow-up appointment.   Thank you for choosing [facility]!!

## 2014-12-12 ENCOUNTER — Encounter: Payer: Self-pay | Admitting: Cardiology

## 2014-12-12 DIAGNOSIS — I471 Supraventricular tachycardia: Secondary | ICD-10-CM | POA: Insufficient documentation

## 2014-12-12 NOTE — Progress Notes (Signed)
Patient ID: Cristina Garcia, female   DOB: [birth year], 54 y.o.   MRN: [id number] PCP: Dr. Sherren Mocha  54 yo with mitral valve prolapse and moderate mitral regurgitation presents for cardiology evaluation.  Patient has had a long history of murmur and has been told for years that she has mitral valve prolapse.  No history of this in her family though her mother had rheumatic fever and a mitral valve operation.  Her PCP sent her for an echo in 1/15, which showed bileaflet prolapse and probably moderate MR.  TEE was recommended.  This was done in 3/15 and showed moderate to severe MR from bileaflet mitral valve prolapse with normal LV size and no pulmonary vein systolic doppler flow reversal.    She has repeat echo in 6/16.  I reviewed this today.  It continues to show moderate to severe mid to late systolic mitral regurgitation in the setting of MVP.  EF 60%, mild LV dilation, moderate LAE.  She has excellent exercise tolerance.  She exercises on most days fairly vigorously. No exertional dyspnea, chest pain, palpitations.  No lightheadedness.  She feels good generally though she has a lot of stress at work. Lately, she has been feeling palpitations/flutters frequently.   ECG + rhythm strip: NSR with short runs of atrial tachycardia.   Labs (2/15): creatinine 1.18  PMH: 1. Mitral valve prolapse: Echo (1/15) with EF 55-60%, grade II diastolic dysfunction, bileaflet MV prolapse, moderate MR.  TEE (3/15) with EF 60%, normal RV size and systolic function, bileaflet MV prolapse, moderate to severe MR (moderate by PISA), no pulmonary vein systolic flow reversal.  Echo (6/16) with EF 60%, MVP with moderate-severe mid to late systolic MR, mild LV dilation (53 mm EDD), grade II diastolic dysfunction, moderate LAE.  2. Multiple myeloma: s/p chemotherapy and autologous stem cell transplant at [facility] in 6/08, clinical remission.  3. CKD 2/2 multiple myeloma 4. H/o rectal bleeding with negative colonoscopy in 3/13.  5.  Osteopenia 6. Atrial tachycardia  SH: Married, nonsmoker, works for [employer].  2 kids.   FH: HTN, lymphoma, diabetes. Mother with rheumatic fever and had MV replacement.   ROS: All systems reviewed and negative except as per HPI.   Current Outpatient Prescriptions  Medication Sig Dispense Refill  . acyclovir (ZOVIRAX) 200 MG capsule TAKE 3 CAPSULES BY MOUTH IN THE MORNING & 2 CAPSULES IN THE EVENING UNTIL WELL, THEN 1 CAPSULE DAILY 90 capsule 3  . aspirin 81 MG tablet Take 81 mg by mouth daily.      . calcium carbonate (OS-CAL) 600 MG TABS Take 600 mg by mouth 2 (two) times daily with a meal.      . conjugated estrogens (PREMARIN) vaginal cream Place vaginally 2 (two) times a week. 42.5 g 11  . lisinopril (PRINIVIL,ZESTRIL) 5 MG tablet TAKE 1 TABLET IN THE MORNING 90 tablet 3  . Multiple Vitamins-Minerals (MULTIVITAL PO) Take 1 tablet by mouth daily.      . metoprolol succinate (TOPROL XL) 25 MG 24 hr tablet Take 1 tablet (25 mg total) by mouth daily. 90 tablet 2   No current facility-administered medications for this visit.    BP 124/80 mmHg  Pulse 80  Ht 5' 8.75" (1.746 m)  Wt 124 lb (56.246 kg)  BMI 18.45 kg/m2 General: NAD Neck: No JVD, no thyromegaly or thyroid nodule.  Lungs: Clear to auscultation bilaterally with normal respiratory effort. CV: Nondisplaced PMI.  Heart regular S1/S2, no K5/L9, mid systolic click with 3/6  late systolic murmur at apex.  No peripheral edema.  No carotid bruit.  Normal pedal pulses.  Abdomen: Soft, nontender, no hepatosplenomegaly, no distention.  Skin: Intact without lesions or rashes.  Neurologic: Alert and oriented x 3.  Psych: Normal affect. Extremities: No clubbing or cyanosis.   Assessment/Plan: 1. Mitral regurgitation: I reviewed the patient's last 2 echoes and TEE.  Her most recent echo shows moderate to severe mitral regurgitation due to bileaflet mitral valve prolapse without marked progression from last year.  No  flail segment.  LV is borderline dilated and there is no evidence for pulmonary hypertension.  She does not describe any significant symptoms, probably NYHA class I.  She has been having palpitations, and rhythm strip today shows some short runs of atrial tachycardia.  -  I am going to have her do an ETT to objectively assess her exercise capacity.  If this is below average, I would consider repeating a TEE to more closely assess the mitral regurgitation.  - I am going to have her wear a 30 day monitor.  If this showed paroxysmal atrial fibrillation, I also would plan TEE to more closely assess MR as atrial fibrillation + severe MR is a Class IIa recommendation for MV repair.  - Check BNP.  2. Palpitations: Possibly due to atrial tachycardia (short runs on rhythm strip today).  However, with significant MR + LAE, she is at risk for atrial fibrillation.  I will arrange 30 day monitor to screen for atrial fibrillation.   - Start Toprol XL 25 mg daily to suppress atrial tachycardia.  3. If the above studies turn out ok, followup in 6 months with repeat echo in 1 year.   [name] 12/12/2014

## 2014-12-18 ENCOUNTER — Ambulatory Visit (INDEPENDENT_AMBULATORY_CARE_PROVIDER_SITE_OTHER): Payer: 59

## 2014-12-18 DIAGNOSIS — I471 Supraventricular tachycardia: Secondary | ICD-10-CM

## 2014-12-18 DIAGNOSIS — I340 Nonrheumatic mitral (valve) insufficiency: Secondary | ICD-10-CM

## 2014-12-18 DIAGNOSIS — I4719 Other supraventricular tachycardia: Secondary | ICD-10-CM

## 2014-12-18 HISTORY — DX: Supraventricular tachycardia: I47.1

## 2014-12-18 HISTORY — DX: Other supraventricular tachycardia: I47.19

## 2014-12-19 ENCOUNTER — Telehealth: Payer: Self-pay | Admitting: Cardiology

## 2014-12-19 NOTE — Telephone Encounter (Signed)
New Message   Pt want to know if she can have the Excercise tolerance test don't before Aug 4th  At another facility because she will be starting a new job and dont want to take off work.

## 2014-12-19 NOTE — Telephone Encounter (Signed)
Spoke with Hilda Blades in checkout & she has pt rescheduled at [facility] for ETT on  July 25,2016 at 10:00am ( reporting to [facility] entrance at 9:30am) [place]

## 2014-12-30 ENCOUNTER — Ambulatory Visit: Payer: 59 | Admitting: Physician Assistant

## 2015-01-06 ENCOUNTER — Ambulatory Visit (HOSPITAL_COMMUNITY)
Admission: RE | Admit: 2015-01-06 | Discharge: 2015-01-06 | Disposition: A | Discharge status: Home / Self Care | Payer: 59 | Source: Ambulatory Visit | Attending: Cardiology | Admitting: Cardiology

## 2015-01-06 DIAGNOSIS — I340 Nonrheumatic mitral (valve) insufficiency: Secondary | ICD-10-CM | POA: Diagnosis not present

## 2015-01-06 DIAGNOSIS — I471 Supraventricular tachycardia: Secondary | ICD-10-CM | POA: Diagnosis not present

## 2015-01-15 LAB — EXERCISE TOLERANCE TEST
CSEPEDS: 56 s
CSEPEW: 10.1 METS
CSEPHR: 90 %
CSEPPHR: 150 {beats}/min
Exercise duration (min): 8 min
MPHR: 165 {beats}/min
Rest HR: 75 {beats}/min

## 2015-01-22 ENCOUNTER — Ambulatory Visit: Payer: 59 | Admitting: Physician Assistant

## 2015-04-08 ENCOUNTER — Other Ambulatory Visit (HOSPITAL_BASED_OUTPATIENT_CLINIC_OR_DEPARTMENT_OTHER): Payer: 59

## 2015-04-08 DIAGNOSIS — C9001 Multiple myeloma in remission: Secondary | ICD-10-CM | POA: Diagnosis not present

## 2015-04-08 LAB — BASIC METABOLIC PANEL (CC13)
Anion Gap: 8 mEq/L (ref 3–11)
BUN: 25.1 mg/dL (ref 7.0–26.0)
CO2: 28 mEq/L (ref 22–29)
Calcium: 9.7 mg/dL (ref 8.4–10.4)
Chloride: 106 mEq/L (ref 98–109)
Creatinine: 1.3 mg/dL — ABNORMAL HIGH (ref 0.6–1.1)
EGFR: 45 mL/min/{1.73_m2} — ABNORMAL LOW (ref >90)
Glucose: 84 mg/dl (ref 70–140)
POTASSIUM: 4 meq/L (ref 3.5–5.1)
SODIUM: 141 meq/L (ref 136–145)

## 2015-04-08 LAB — CBC WITH DIFFERENTIAL/PLATELET
BASO%: 0.6 % (ref 0.0–2.0)
Basophils Absolute: 0 10*3/uL (ref 0.0–0.1)
EOS%: 1.1 % (ref 0.0–7.0)
Eosinophils Absolute: 0 10*3/uL (ref 0.0–0.5)
HCT: 43.2 % (ref 34.8–46.6)
HGB: 14.7 g/dL (ref 11.6–15.9)
LYMPH%: 24.9 % (ref 14.0–49.7)
MCH: 33.4 pg (ref 25.1–34.0)
MCHC: 34 g/dL (ref 31.5–36.0)
MCV: 98.1 fL (ref 79.5–101.0)
MONO#: 0.4 10*3/uL (ref 0.1–0.9)
MONO%: 9 % (ref 0.0–14.0)
NEUT%: 64.4 % (ref 38.4–76.8)
NEUTROS ABS: 2.9 10*3/uL (ref 1.5–6.5)
PLATELETS: 143 10*3/uL — AB (ref 145–400)
RBC: 4.41 10*6/uL (ref 3.70–5.45)
RDW: 12.5 % (ref 11.2–14.5)
WBC: 4.4 10*3/uL (ref 3.9–10.3)
lymph#: 1.1 10*3/uL (ref 0.9–3.3)

## 2015-04-10 LAB — PROTEIN ELECTROPHORESIS, SERUM
ALBUMIN ELP: 4.3 g/dL (ref 3.8–4.8)
ALPHA-1-GLOBULIN: 0.3 g/dL (ref 0.2–0.3)
Alpha-2-Globulin: 0.6 g/dL (ref 0.5–0.9)
BETA GLOBULIN: 0.3 g/dL — AB (ref 0.4–0.6)
Beta 2: 0.2 g/dL (ref 0.2–0.5)
Gamma Globulin: 0.7 g/dL — ABNORMAL LOW (ref 0.8–1.7)
TOTAL PROTEIN, SERUM ELECTROPHOR: 6.4 g/dL (ref 6.1–8.1)

## 2015-04-10 LAB — KAPPA/LAMBDA LIGHT CHAINS
Kappa free light chain: 2.23 mg/dL — ABNORMAL HIGH (ref 0.33–1.94)
Kappa:Lambda Ratio: 2.23 — ABNORMAL HIGH (ref 0.26–1.65)
Lambda Free Lght Chn: 1 mg/dL (ref 0.57–2.63)

## 2015-04-15 ENCOUNTER — Telehealth: Payer: Self-pay | Admitting: Oncology

## 2015-04-15 ENCOUNTER — Ambulatory Visit (HOSPITAL_BASED_OUTPATIENT_CLINIC_OR_DEPARTMENT_OTHER): Payer: 59 | Admitting: Oncology

## 2015-04-15 VITALS — BP 102/54 | HR 61 | Temp 98.5°F | Resp 18 | Ht 68.75 in | Wt 125.2 lb

## 2015-04-15 DIAGNOSIS — Z23 Encounter for immunization: Secondary | ICD-10-CM

## 2015-04-15 DIAGNOSIS — C9001 Multiple myeloma in remission: Secondary | ICD-10-CM | POA: Diagnosis not present

## 2015-04-15 MED ORDER — INFLUENZA VAC SPLIT QUAD 0.5 ML IM SUSY
0.5000 mL | PREFILLED_SYRINGE | Freq: Once | INTRAMUSCULAR | Status: AC
  Administered 2015-04-15: 0.5 mL via INTRAMUSCULAR

## 2015-04-15 NOTE — Progress Notes (Signed)
  [place] [facility] OFFICE PROGRESS NOTE   Diagnosis: Multiple myeloma  INTERVAL HISTORY:   Cristina Garcia returns as scheduled. She feels well. She is working. No recent infection. No bone pain other than discomfort at the left toes. She reports there is a familial deformity of the toes. She has seen a Psychologist, sport and exercise and is considering operative correction.  She is followed by cardiology for mitral regurgitation and mitral valve prolapse.  Objective:  Vital signs in last 24 hours:  Blood pressure 102/54, pulse 61, temperature 98.5 F (36.9 C), temperature source Oral, resp. rate 18, height 5' 8.75" (1.746 m), weight 125 lb 3.2 oz (56.79 kg), SpO2 100 %.    Resp: Lungs clear bilaterally Cardio: Regular rate and rhythm, systolic click,late systolic murmur GI: No hepatosplenomegaly Vascular: No Leg edema     Lab Results:  Lab Results  Component Value Date   WBC 4.4 04/08/2015   HGB 14.7 04/08/2015   HCT 43.2 04/08/2015   MCV 98.1 04/08/2015   PLT 143* 04/08/2015   NEUTROABS 2.9 04/08/2015  kappa free light chains 2.23 No monoclonal protein detected   Medications: I have reviewed the patient's current medications.  Assessment/Plan: 1. Multiple myeloma, IgA kappa, status post high-dose chemotherapy with autologous stem cell support at [facility] in June 2008. She remains in clinical remission. 2. Renal insufficiency secondary to multiple myeloma.  3. History of hypokalemia. 4. History of left pelvic pain secondary to a lytic left iliac lesion. A metastatic bone survey was unchanged 07/30/2014 5. Simple cyst at the lower pole of the right kidney on a renal ultrasound, 11/08/2006. 6. Multiseptated cyst of the left adnexa on an ultrasound, 11/08/2006. 7. Osteopenia 8. Mitral valve prolapse/mitral regurgitation 9. Report of intermittent rectal bleeding. Normal colonoscopy by Dr. Fuller Plan on 08/20/2011.    Disposition:  Cristina Garcia remains in clinical remission from multiple  myeloma. She will return for an office and lab visit in 6 months. She received an influenza vaccine today. She continues follow-up with cardiology for management of mitral regurgitation. She is seeing orthopedics to consider correction of "hammertoes ". We will plan for a metastatic bone survey at a 2 year interval.  [name], MD  04/15/2015  4:29 PM

## 2015-04-15 NOTE — Telephone Encounter (Signed)
per pof to sch pt appt-gave pt copy of avs °

## 2015-07-11 ENCOUNTER — Other Ambulatory Visit: Payer: Self-pay | Admitting: *Deleted

## 2015-07-11 ENCOUNTER — Encounter: Payer: Self-pay | Admitting: *Deleted

## 2015-07-11 DIAGNOSIS — I340 Nonrheumatic mitral (valve) insufficiency: Secondary | ICD-10-CM

## 2015-07-18 ENCOUNTER — Other Ambulatory Visit: Payer: Self-pay | Admitting: Family Medicine

## 2015-07-24 ENCOUNTER — Ambulatory Visit (HOSPITAL_COMMUNITY): Payer: 59 | Attending: Cardiology

## 2015-07-24 ENCOUNTER — Other Ambulatory Visit: Payer: Self-pay

## 2015-07-24 DIAGNOSIS — I341 Nonrheumatic mitral (valve) prolapse: Secondary | ICD-10-CM | POA: Diagnosis not present

## 2015-07-24 DIAGNOSIS — I340 Nonrheumatic mitral (valve) insufficiency: Secondary | ICD-10-CM | POA: Insufficient documentation

## 2015-07-24 DIAGNOSIS — I071 Rheumatic tricuspid insufficiency: Secondary | ICD-10-CM | POA: Insufficient documentation

## 2015-07-24 DIAGNOSIS — I059 Rheumatic mitral valve disease, unspecified: Secondary | ICD-10-CM | POA: Diagnosis present

## 2015-08-08 ENCOUNTER — Encounter: Payer: Self-pay | Admitting: Cardiology

## 2015-08-08 ENCOUNTER — Ambulatory Visit (INDEPENDENT_AMBULATORY_CARE_PROVIDER_SITE_OTHER): Payer: 59 | Admitting: Cardiology

## 2015-08-08 VITALS — BP 110/64 | HR 74 | Ht 68.0 in | Wt 129.0 lb

## 2015-08-08 DIAGNOSIS — I471 Supraventricular tachycardia: Secondary | ICD-10-CM

## 2015-08-08 DIAGNOSIS — I340 Nonrheumatic mitral (valve) insufficiency: Secondary | ICD-10-CM

## 2015-08-08 NOTE — Patient Instructions (Signed)
Medication Instructions:  Your physician recommends that you continue on your current medications as directed. Please refer to the Current Medication list given to you today.   Labwork: None Ordered   Testing/Procedures: None Ordered   Follow-Up: Your physician recommends that you schedule a follow-up appointment in: June 23   If you need a refill on your cardiac medications before your next appointment, please call your pharmacy.

## 2015-08-10 NOTE — Progress Notes (Signed)
Patient ID: Cristina Garcia, female   DOB: Apr 06, 1961, 55 y.o.   MRN: [id number] PCP: Dr. Sherren Mocha  55 yo with mitral valve prolapse and mitral regurgitation presents for cardiology evaluation.  Patient has had a long history of murmur and has been told for years that she has mitral valve prolapse.  No history of this in her family though her mother had rheumatic fever and a mitral valve operation.  Her PCP sent her for an echo in 1/15, which showed bileaflet prolapse and probably moderate MR.  TEE was recommended.  This was done in 3/15 and showed moderate to severe MR from bileaflet mitral valve prolapse with normal LV size and no pulmonary vein systolic doppler flow reversal.  She has repeat echo in 6/16.  It continued to show moderate to severe mid to late systolic mitral regurgitation in the setting of MVP.  EF 60%, mild LV dilation, moderate LAE.    She developed palpitations, with monitoring showing frequent PACs and short runs of atrial tachycardia.  She was started on Toprol XL at low dose, which has helped control the palpitations.    Over the last 6 months, she does feel like she gets fatigued more easily.  Occasional mild lightheadedness.  No frank dyspnea but does not have the exercise tolerance she used to have.  Feels like she "can't take as deep a breath."    I reviewed the echo from 2/17.  This shows EF 55-60% with mildly dilated LV and moderate left atrial enlargement.  There is severe bileaflet prolapse with severe MR.    ECG: NSR, RAE.   Labs (2/15): creatinine 1.18 Labs (10/16): K 4, creatinine 1.3  PMH: 1. Mitral valve prolapse: Echo (1/15) with EF 55-60%, grade II diastolic dysfunction, bileaflet MV prolapse, moderate MR.  TEE (3/15) with EF 60%, normal RV size and systolic function, bileaflet MV prolapse, moderate to severe MR (moderate by PISA), no pulmonary vein systolic flow reversal.  Echo (6/16) with EF 60%, MVP with moderate-severe mid to late systolic MR, mild LV dilation  (53 mm EDD), grade II diastolic dysfunction, moderate LAE. Echo (2/17) with EF 55-60%, mild LV dilation (56 mm EDD), grade II diastolic dysfunction, moderate LAE, bileaflet MVP with severe MR, normal RV size and systolic function, unable to estimate PA pressure.  2. Multiple myeloma: s/p chemotherapy and autologous stem cell transplant at [facility] in 6/08, clinical remission.  3. CKD 2/2 multiple myeloma 4. H/o rectal bleeding with negative colonoscopy in 3/13.  5. Osteopenia 6. Atrial tachycardia: 30 day monitor in 8/16 with atrial tachycardia, no atrial fibrillation.  7. ETT (8/16) with 8'56" exercise, no ischemia, frequent PACs/PVCs.   SH: Married, nonsmoker, works for [employer].  2 kids.   FH: HTN, lymphoma, diabetes. Mother with rheumatic fever and had MV replacement.   ROS: All systems reviewed and negative except as per HPI.   Current Outpatient Prescriptions  Medication Sig Dispense Refill  . aspirin 81 MG tablet Take 81 mg by mouth daily.      . calcium carbonate (OS-CAL) 600 MG TABS Take 600 mg by mouth 2 (two) times daily with a meal.      . conjugated estrogens (PREMARIN) vaginal cream Place vaginally 2 (two) times a week. 42.5 g 11  . lisinopril (PRINIVIL,ZESTRIL) 5 MG tablet TAKE 1 TABLET BY MOUTH EVERY MORNING 90 tablet 2  . metoprolol succinate (TOPROL XL) 25 MG 24 hr tablet Take 1 tablet (25 mg total) by mouth daily. 90 tablet  2  . Multiple Vitamins-Minerals (MULTIVITAL PO) Take 1 tablet by mouth daily.      Marland Kitchen acyclovir (ZOVIRAX) 200 MG capsule TAKE 3 CAPSULES BY MOUTH IN THE MORNING & 2 CAPSULES IN THE EVENING UNTIL WELL, THEN 1 CAPSULE DAILY (Patient not taking: Reported on 08/08/2015) 90 capsule 3   No current facility-administered medications for this visit.    BP 110/64 mmHg  Pulse 74  Ht '5\' 8"'  (1.727 m)  Wt 129 lb (58.514 kg)  BMI 19.62 kg/m2 General: NAD Neck: No JVD, no thyromegaly or thyroid nodule.  Lungs: Clear to auscultation bilaterally with  normal respiratory effort. CV: Nondisplaced PMI.  Heart regular S1/S2, no W6/A4, mid systolic click with 3/6 mid to late systolic murmur at apex.  No peripheral edema.  No carotid bruit.  Normal pedal pulses.  Abdomen: Soft, nontender, no hepatosplenomegaly, no distention.  Skin: Intact without lesions or rashes.  Neurologic: Alert and oriented x 3.  Psych: Normal affect. Extremities: No clubbing or cyanosis.   Assessment/Plan: 1. Mitral regurgitation: Echo this month shows severe mitral valve prolapse with severe regurgitation.  The LV is mildly dilated, somewhat larger than prior echo.  I cannot estimate PA systolic pressure from this study.  I think that she has begun to develop some symptoms from MR at this point. We discussed her echo and symptoms, and I think she is nearing the point where she should have mitral valve repair.  - I will arrange for TEE and LHC/RHC.  She is going to let me know based on her schedule when this would work.  We discussed the procedures in depth and she understands risks/benefits.  She agrees to proceed. - After TEE and cath, will refer her to Dr Roxy Manns for evaluation for minimally invasive mitral valve repair.  2. Palpitations: Suspect due to PACs and atrial tachycardia. 30 day monitor in 8/16 did not show atrial fibrillation.   [name] 08/10/2015

## 2015-08-11 ENCOUNTER — Telehealth: Payer: Self-pay

## 2015-08-11 ENCOUNTER — Telehealth: Payer: Self-pay | Admitting: Cardiology

## 2015-08-11 DIAGNOSIS — I340 Nonrheumatic mitral (valve) insufficiency: Secondary | ICD-10-CM

## 2015-08-11 NOTE — Telephone Encounter (Signed)
New Message  Pt calling to speak w/ NR concerning cath and TEE as discussed at recent OV w/ Dr Aundra Dubin. Please call back and discuss.

## 2015-08-11 NOTE — Telephone Encounter (Signed)
Patient called today wanting to let Dr. Benay Spice know that her cardiologist will be performing a mitral valve repair.

## 2015-08-11 NOTE — Telephone Encounter (Signed)
Okay, let us know if she would like to be seen prior to surgery Ask her to let us know operative date so we can see her in the hospital

## 2015-08-11 NOTE — Telephone Encounter (Signed)
Pt called as she needs to be scheduled for right and left heart cath and TEE. Pt wanting to set everything up okay to start after Wed. 3/1.  Pt aware that our office iw working details to get testing details setup and will call her back. She verbalized understanding, no additional questions at this time.   OV Note: 08/08/2015 I will arrange for TEE and LHC/RHC. She is going to let me know based on her schedule when this would work. We discussed the procedures in depth and she understands risks/benefits. She agrees to proceed. - After TEE and cath, will refer her to Dr Roxy Manns for evaluation for minimally invasive mitral valve repair.

## 2015-08-12 NOTE — Telephone Encounter (Signed)
Pt reports she has testing scheduled for Monday 3/6. Thinks she will have mitral valve repair done soon with Dr. Aundra Dubin. Pt will call the office with procedure date.

## 2015-08-12 NOTE — Telephone Encounter (Signed)
Pt aware of MD changes for her TEE and cath. Pt letter updated, no additional questions at this time.

## 2015-08-12 NOTE — Telephone Encounter (Signed)
Called and had TEE changed to Dr. Aundra Dubin.

## 2015-08-12 NOTE — Telephone Encounter (Signed)
Pt scheduled for TEE and Cath on Monday 3/6. Pt aware of schedule and going to have lab work completed at [facility] and pick up letter from [employer] office front desk desk.  Pt will also pick up letter with instructions at that time

## 2015-08-12 NOTE — Telephone Encounter (Signed)
Both cath and TEE should be on for me.  Does not look like they are.

## 2015-08-15 LAB — BASIC METABOLIC PANEL
BUN: 25 mg/dL (ref 7–25)
CHLORIDE: 98 mmol/L (ref 98–110)
CO2: 30 mmol/L (ref 20–31)
CREATININE: 1.41 mg/dL — AB (ref 0.50–1.05)
Calcium: 9.6 mg/dL (ref 8.6–10.4)
Glucose, Bld: 97 mg/dL (ref 65–99)
Potassium: 4 mmol/L (ref 3.5–5.3)
Sodium: 140 mmol/L (ref 135–146)

## 2015-08-15 LAB — CBC WITH DIFFERENTIAL/PLATELET
BASOS ABS: 0 10*3/uL (ref 0.0–0.1)
Basophils Relative: 0 % (ref 0–1)
EOS ABS: 0.1 10*3/uL (ref 0.0–0.7)
EOS PCT: 1 % (ref 0–5)
HCT: 45.3 % (ref 36.0–46.0)
Hemoglobin: 15.1 g/dL — ABNORMAL HIGH (ref 12.0–15.0)
Lymphocytes Relative: 27 % (ref 12–46)
Lymphs Abs: 1.5 10*3/uL (ref 0.7–4.0)
MCH: 32.6 pg (ref 26.0–34.0)
MCHC: 33.3 g/dL (ref 30.0–36.0)
MCV: 97.8 fL (ref 78.0–100.0)
MPV: 10.3 fL (ref 8.6–12.4)
Monocytes Absolute: 0.5 10*3/uL (ref 0.1–1.0)
Monocytes Relative: 8 % (ref 3–12)
Neutro Abs: 3.6 10*3/uL (ref 1.7–7.7)
Neutrophils Relative %: 64 % (ref 43–77)
PLATELETS: 170 10*3/uL (ref 150–400)
RBC: 4.63 MIL/uL (ref 3.87–5.11)
RDW: 12.6 % (ref 11.5–15.5)
WBC: 5.7 10*3/uL (ref 4.0–10.5)

## 2015-08-15 LAB — APTT: APTT: 31 s (ref 24–37)

## 2015-08-15 LAB — PROTIME-INR
INR: 0.95 (ref <1.50)
PROTHROMBIN TIME: 12.8 s (ref 11.6–15.2)

## 2015-08-17 ENCOUNTER — Other Ambulatory Visit (HOSPITAL_COMMUNITY): Payer: Self-pay | Admitting: Cardiology

## 2015-08-17 DIAGNOSIS — I340 Nonrheumatic mitral (valve) insufficiency: Secondary | ICD-10-CM

## 2015-08-18 ENCOUNTER — Encounter (HOSPITAL_COMMUNITY)
Admission: RE | Disposition: A | Discharge status: Home / Self Care | Payer: Self-pay | Source: Ambulatory Visit | Attending: Cardiovascular Disease

## 2015-08-18 ENCOUNTER — Ambulatory Visit (HOSPITAL_COMMUNITY)
Admission: RE | Admit: 2015-08-18 | Discharge: 2015-08-18 | Disposition: A | Discharge status: Home / Self Care | Payer: 59 | Source: Ambulatory Visit | Attending: Cardiovascular Disease | Admitting: Cardiovascular Disease

## 2015-08-18 ENCOUNTER — Encounter (HOSPITAL_COMMUNITY): Payer: Self-pay | Admitting: *Deleted

## 2015-08-18 ENCOUNTER — Telehealth: Payer: Self-pay | Admitting: Cardiology

## 2015-08-18 ENCOUNTER — Ambulatory Visit (HOSPITAL_BASED_OUTPATIENT_CLINIC_OR_DEPARTMENT_OTHER): Payer: 59

## 2015-08-18 DIAGNOSIS — I059 Rheumatic mitral valve disease, unspecified: Secondary | ICD-10-CM | POA: Diagnosis not present

## 2015-08-18 DIAGNOSIS — I340 Nonrheumatic mitral (valve) insufficiency: Secondary | ICD-10-CM

## 2015-08-18 DIAGNOSIS — C9001 Multiple myeloma in remission: Secondary | ICD-10-CM | POA: Diagnosis not present

## 2015-08-18 DIAGNOSIS — R002 Palpitations: Secondary | ICD-10-CM | POA: Diagnosis not present

## 2015-08-18 DIAGNOSIS — I341 Nonrheumatic mitral (valve) prolapse: Secondary | ICD-10-CM | POA: Diagnosis not present

## 2015-08-18 DIAGNOSIS — Z7982 Long term (current) use of aspirin: Secondary | ICD-10-CM | POA: Insufficient documentation

## 2015-08-18 DIAGNOSIS — M8580 Other specified disorders of bone density and structure, unspecified site: Secondary | ICD-10-CM | POA: Insufficient documentation

## 2015-08-18 DIAGNOSIS — N182 Chronic kidney disease, stage 2 (mild): Secondary | ICD-10-CM | POA: Diagnosis not present

## 2015-08-18 HISTORY — PX: TEE WITHOUT CARDIOVERSION: SHX5443

## 2015-08-18 HISTORY — PX: CARDIAC CATHETERIZATION: SHX172

## 2015-08-18 LAB — BASIC METABOLIC PANEL
Anion gap: 10 (ref 5–15)
BUN: 23 mg/dL — ABNORMAL HIGH (ref 6–20)
CALCIUM: 9.9 mg/dL (ref 8.9–10.3)
CHLORIDE: 107 mmol/L (ref 101–111)
CO2: 24 mmol/L (ref 22–32)
CREATININE: 1.27 mg/dL — AB (ref 0.44–1.00)
GFR calc Af Amer: 54 mL/min — ABNORMAL LOW (ref >60)
GFR calc non Af Amer: 46 mL/min — ABNORMAL LOW (ref >60)
GLUCOSE: 87 mg/dL (ref 65–99)
Potassium: 4.1 mmol/L (ref 3.5–5.1)
Sodium: 141 mmol/L (ref 135–145)

## 2015-08-18 LAB — POCT I-STAT 3, VENOUS BLOOD GAS (G3P V)
Acid-base deficit: 2 mmol/L (ref 0.0–2.0)
Bicarbonate: 24.2 mEq/L — ABNORMAL HIGH (ref 20.0–24.0)
O2 SAT: 72 %
PCO2 VEN: 47.8 mmHg (ref 45.0–50.0)
TCO2: 26 mmol/L (ref 0–100)
pH, Ven: 7.313 — ABNORMAL HIGH (ref 7.250–7.300)
pO2, Ven: 42 mmHg (ref 30.0–45.0)

## 2015-08-18 SURGERY — ECHOCARDIOGRAM, TRANSESOPHAGEAL
Anesthesia: Moderate Sedation

## 2015-08-18 SURGERY — RIGHT/LEFT HEART CATH AND CORONARY ANGIOGRAPHY

## 2015-08-18 MED ORDER — MIDAZOLAM HCL 2 MG/2ML IJ SOLN
INTRAMUSCULAR | Status: DC | PRN
  Administered 2015-08-18 (×3): 1 mg via INTRAVENOUS

## 2015-08-18 MED ORDER — LIDOCAINE HCL (PF) 1 % IJ SOLN
INTRAMUSCULAR | Status: DC | PRN
  Administered 2015-08-18: 20 mL
  Administered 2015-08-18: 3 mL
  Administered 2015-08-18: 20 mL
  Administered 2015-08-18: 3 mL

## 2015-08-18 MED ORDER — SODIUM CHLORIDE 0.9 % WEIGHT BASED INFUSION: 1.0000 mL/kg/h | INTRAVENOUS | Status: DC

## 2015-08-18 MED ORDER — SODIUM CHLORIDE 0.9 % WEIGHT BASED INFUSION: 3.0000 mL/kg/h | INTRAVENOUS | Status: AC

## 2015-08-18 MED ORDER — LIDOCAINE HCL (PF) 1 % IJ SOLN
INTRAMUSCULAR | Status: AC
  Filled 2015-08-18: qty 30

## 2015-08-18 MED ORDER — FENTANYL CITRATE (PF) 100 MCG/2ML IJ SOLN
25.0000 ug | Freq: Once | INTRAMUSCULAR | Status: AC
  Administered 2015-08-18: 25 ug via INTRAVENOUS

## 2015-08-18 MED ORDER — IOHEXOL 350 MG/ML SOLN
INTRAVENOUS | Status: DC | PRN
  Administered 2015-08-18: 90 mL via INTRA_ARTERIAL

## 2015-08-18 MED ORDER — BUTAMBEN-TETRACAINE-BENZOCAINE 2-2-14 % EX AERO
INHALATION_SPRAY | CUTANEOUS | Status: DC | PRN
  Administered 2015-08-18: 2 via TOPICAL

## 2015-08-18 MED ORDER — DIPHENHYDRAMINE HCL 50 MG/ML IJ SOLN
INTRAMUSCULAR | Status: AC
  Filled 2015-08-18: qty 1

## 2015-08-18 MED ORDER — MIDAZOLAM HCL 2 MG/2ML IJ SOLN
INTRAMUSCULAR | Status: AC
  Filled 2015-08-18: qty 2

## 2015-08-18 MED ORDER — ACETAMINOPHEN 325 MG PO TABS: 650.0000 mg | ORAL_TABLET | ORAL | Status: DC | PRN

## 2015-08-18 MED ORDER — SODIUM CHLORIDE 0.9 % WEIGHT BASED INFUSION: 1.0000 mL/kg/h | INTRAVENOUS | Status: AC

## 2015-08-18 MED ORDER — FENTANYL CITRATE (PF) 100 MCG/2ML IJ SOLN
INTRAMUSCULAR | Status: AC
  Filled 2015-08-18: qty 2

## 2015-08-18 MED ORDER — SODIUM CHLORIDE 0.9% FLUSH: 3.0000 mL | INTRAVENOUS | Status: DC | PRN

## 2015-08-18 MED ORDER — SODIUM CHLORIDE 0.9 % IV SOLN: 250.0000 mL | INTRAVENOUS | Status: DC | PRN

## 2015-08-18 MED ORDER — SODIUM CHLORIDE 0.9 % IV SOLN
250.0000 mL | INTRAVENOUS | Status: DC | PRN
Start: 2015-08-18 — End: 2015-08-18

## 2015-08-18 MED ORDER — ASPIRIN 81 MG PO CHEW
81.0000 mg | CHEWABLE_TABLET | ORAL | Status: DC
  Filled 2015-08-18: qty 1

## 2015-08-18 MED ORDER — MIDAZOLAM HCL 10 MG/2ML IJ SOLN
INTRAMUSCULAR | Status: DC | PRN
  Administered 2015-08-18: 2 mg via INTRAVENOUS
  Administered 2015-08-18 (×2): 1 mg via INTRAVENOUS

## 2015-08-18 MED ORDER — SODIUM CHLORIDE 0.9% FLUSH: 3.0000 mL | Freq: Two times a day (BID) | INTRAVENOUS | Status: DC

## 2015-08-18 MED ORDER — HEPARIN (PORCINE) IN NACL 2-0.9 UNIT/ML-% IJ SOLN
INTRAMUSCULAR | Status: AC
  Filled 2015-08-18: qty 1500

## 2015-08-18 MED ORDER — ONDANSETRON HCL 4 MG/2ML IJ SOLN: 4.0000 mg | Freq: Four times a day (QID) | INTRAMUSCULAR | Status: DC | PRN

## 2015-08-18 MED ORDER — MIDAZOLAM HCL 5 MG/ML IJ SOLN
INTRAMUSCULAR | Status: AC
  Filled 2015-08-18: qty 2

## 2015-08-18 MED ORDER — FENTANYL CITRATE (PF) 100 MCG/2ML IJ SOLN
INTRAMUSCULAR | Status: DC | PRN
  Administered 2015-08-18 (×3): 25 ug via INTRAVENOUS

## 2015-08-18 MED ORDER — ASPIRIN 81 MG PO CHEW
81.0000 mg | CHEWABLE_TABLET | ORAL | Status: DC
  Filled 2015-08-18 (×2): qty 1

## 2015-08-18 MED ORDER — HEPARIN (PORCINE) IN NACL 2-0.9 UNIT/ML-% IJ SOLN
INTRAMUSCULAR | Status: DC | PRN
  Administered 2015-08-18: 1500 mL

## 2015-08-18 MED ORDER — SODIUM CHLORIDE 0.9% FLUSH
3.0000 mL | Freq: Two times a day (BID) | INTRAVENOUS | Status: DC
Start: 2015-08-18 — End: 2015-08-18

## 2015-08-18 MED ORDER — SODIUM CHLORIDE 0.9 % IV SOLN
INTRAVENOUS | Status: DC
  Administered 2015-08-18: 08:00:00 via INTRAVENOUS

## 2015-08-18 MED ORDER — FENTANYL CITRATE (PF) 100 MCG/2ML IJ SOLN
INTRAMUSCULAR | Status: DC | PRN
  Administered 2015-08-18: 25 ug via INTRAVENOUS

## 2015-08-18 MED ORDER — HEPARIN SODIUM (PORCINE) 1000 UNIT/ML IJ SOLN
INTRAMUSCULAR | Status: AC
  Filled 2015-08-18: qty 1

## 2015-08-18 MED ORDER — VERAPAMIL HCL 2.5 MG/ML IV SOLN
INTRAVENOUS | Status: AC
  Filled 2015-08-18: qty 2

## 2015-08-18 SURGICAL SUPPLY — 19 items
CATH BALLN WEDGE 5F 110CM (CATHETERS) ×1 IMPLANT
CATH INFINITI 5FR MULTPACK ANG (CATHETERS) ×1 IMPLANT
CATH SWAN GANZ 7F STRAIGHT (CATHETERS) ×1 IMPLANT
COVER PRB 48X5XTLSCP FOLD TPE (BAG) IMPLANT
COVER PROBE 5X48 (BAG) ×2
DEVICE RAD COMP TR BAND LRG (VASCULAR PRODUCTS) ×2 IMPLANT
GLIDESHEATH SLEND SS 6F .021 (SHEATH) ×1 IMPLANT
GUIDEWIRE .025 260CM (WIRE) ×1 IMPLANT
KIT HEART LEFT (KITS) ×2 IMPLANT
PACK CARDIAC CATHETERIZATION (CUSTOM PROCEDURE TRAY) ×2 IMPLANT
SHEATH FAST CATH BRACH 5F 5CM (SHEATH) ×1 IMPLANT
SHEATH PINNACLE 5F 10CM (SHEATH) ×1 IMPLANT
SHEATH PINNACLE 7F 10CM (SHEATH) ×2 IMPLANT
SYR MEDRAD MARK V 150ML (SYRINGE) ×2 IMPLANT
TRANSDUCER W/STOPCOCK (MISCELLANEOUS) ×2 IMPLANT
TUBING CIL FLEX 10 FLL-RA (TUBING) ×2 IMPLANT
WIRE EMERALD 3MM-J .035X150CM (WIRE) ×1 IMPLANT
WIRE HI TORQ VERSACORE-J 145CM (WIRE) ×2 IMPLANT
WIRE SAFE-T 1.5MM-J .035X260CM (WIRE) ×1 IMPLANT

## 2015-08-18 NOTE — Telephone Encounter (Signed)
New message ° ° ° ° ° °Returning a nurses call °

## 2015-08-18 NOTE — Interval H&P Note (Signed)
History and Physical Interval Note:  08/18/2015 9:20 AM  Cristina Garcia  has presented today for surgery, with the diagnosis of mr  The various methods of treatment have been discussed with the patient and family. After consideration of risks, benefits and other options for treatment, the patient has consented to  Procedure(s): Right/Left Heart Cath and Coronary Angiography (N/A) as a surgical intervention .  The patient's history has been reviewed, patient examined, no change in status, stable for surgery.  I have reviewed the patient's chart and labs.  Questions were answered to the patient's satisfaction.     Jerimyah Vandunk [employer]

## 2015-08-18 NOTE — Interval H&P Note (Signed)
History and Physical Interval Note:  08/18/2015 8:15 AM  Cristina Garcia  has presented today for surgery, with the diagnosis of mitral regurgitation  The various methods of treatment have been discussed with the patient and family. After consideration of risks, benefits and other options for treatment, the patient has consented to  Procedure(s): TRANSESOPHAGEAL ECHOCARDIOGRAM (TEE) (N/A) as a surgical intervention .  The patient's history has been reviewed, patient examined, no change in status, stable for surgery.  I have reviewed the patient's chart and labs.  Questions were answered to the patient's satisfaction.     Katasha Riga [employer]

## 2015-08-18 NOTE — CV Procedure (Signed)
Procedure: TEE  Indication: Mitral regurgitation  Sedation: Versed 4 mg IV, Fentanyl 25 mcg  Findings: Please see echo section for full report.  Mildly dilated LV with normal systolic function, EF [id number].  Normal RV size and systolic function.  Mild left atrial enlargement, no LA appendage thrombus.  Normal right atrium.  No tricuspid regurgitation.  Trileaflet aortic valve without stenosis or regurgitation.  The mitral valve was thickened with bileaflet prolapse.  There were multiple jets (appeared to be 3) of mitral regurgitation, appears severe.  PISA not done with multiple jets.  There was systolic flow reversal in the pulmonary vein doppler pattern.  Normal caliber aorta without significant plaque.  Bubble study negative, no evidence for ASD/PFO.   [name] 08/18/2015 8:32 AM

## 2015-08-18 NOTE — Telephone Encounter (Signed)
**Note De-identified Cristina Garcia Obfuscation** The pt is advised of her lab results and she verbalized understanding. 

## 2015-08-18 NOTE — Progress Notes (Deleted)
  Echocardiogram 2D Echocardiogram has been performed.  Cristina Garcia 08/18/2015, 8:50 AM

## 2015-08-18 NOTE — Progress Notes (Signed)
  Echocardiogram Echocardiogram Transesophageal has been performed.  Cristina Garcia 08/18/2015, 8:51 AM

## 2015-08-18 NOTE — H&P (View-Only) (Signed)
Patient ID: Cristina Garcia, female   DOB: [birth year], 55 y.o.   MRN: [id number] PCP: Dr. Sherren Mocha  55 yo with mitral valve prolapse and mitral regurgitation presents for cardiology evaluation.  Patient has had a long history of murmur and has been told for years that she has mitral valve prolapse.  No history of this in her family though her mother had rheumatic fever and a mitral valve operation.  Her PCP sent her for an echo in 1/15, which showed bileaflet prolapse and probably moderate MR.  TEE was recommended.  This was done in 3/15 and showed moderate to severe MR from bileaflet mitral valve prolapse with normal LV size and no pulmonary vein systolic doppler flow reversal.  She has repeat echo in 6/16.  It continued to show moderate to severe mid to late systolic mitral regurgitation in the setting of MVP.  EF 60%, mild LV dilation, moderate LAE.    She developed palpitations, with monitoring showing frequent PACs and short runs of atrial tachycardia.  She was started on Toprol XL at low dose, which has helped control the palpitations.    Over the last 6 months, she does feel like she gets fatigued more easily.  Occasional mild lightheadedness.  No frank dyspnea but does not have the exercise tolerance she used to have.  Feels like she "can't take as deep a breath."    I reviewed the echo from 2/17.  This shows EF 55-60% with mildly dilated LV and moderate left atrial enlargement.  There is severe bileaflet prolapse with severe MR.    ECG: NSR, RAE.   Labs (2/15): creatinine 1.18 Labs (10/16): K 4, creatinine 1.3  PMH: 1. Mitral valve prolapse: Echo (1/15) with EF 55-60%, grade II diastolic dysfunction, bileaflet MV prolapse, moderate MR.  TEE (3/15) with EF 60%, normal RV size and systolic function, bileaflet MV prolapse, moderate to severe MR (moderate by PISA), no pulmonary vein systolic flow reversal.  Echo (6/16) with EF 60%, MVP with moderate-severe mid to late systolic MR, mild LV dilation  (53 mm EDD), grade II diastolic dysfunction, moderate LAE. Echo (2/17) with EF 55-60%, mild LV dilation (56 mm EDD), grade II diastolic dysfunction, moderate LAE, bileaflet MVP with severe MR, normal RV size and systolic function, unable to estimate PA pressure.  2. Multiple myeloma: s/p chemotherapy and autologous stem cell transplant at [facility] in 6/08, clinical remission.  3. CKD 2/2 multiple myeloma 4. H/o rectal bleeding with negative colonoscopy in 3/13.  5. Osteopenia 6. Atrial tachycardia: 30 day monitor in 8/16 with atrial tachycardia, no atrial fibrillation.  7. ETT (8/16) with 8'56" exercise, no ischemia, frequent PACs/PVCs.   SH: Married, nonsmoker, works for [employer].  2 kids.   FH: HTN, lymphoma, diabetes. Mother with rheumatic fever and had MV replacement.   ROS: All systems reviewed and negative except as per HPI.   Current Outpatient Prescriptions  Medication Sig Dispense Refill  . aspirin 81 MG tablet Take 81 mg by mouth daily.      . calcium carbonate (OS-CAL) 600 MG TABS Take 600 mg by mouth 2 (two) times daily with a meal.      . conjugated estrogens (PREMARIN) vaginal cream Place vaginally 2 (two) times a week. 42.5 g 11  . lisinopril (PRINIVIL,ZESTRIL) 5 MG tablet TAKE 1 TABLET BY MOUTH EVERY MORNING 90 tablet 2  . metoprolol succinate (TOPROL XL) 25 MG 24 hr tablet Take 1 tablet (25 mg total) by mouth daily. 90 tablet  2  . Multiple Vitamins-Minerals (MULTIVITAL PO) Take 1 tablet by mouth daily.      Marland Kitchen acyclovir (ZOVIRAX) 200 MG capsule TAKE 3 CAPSULES BY MOUTH IN THE MORNING & 2 CAPSULES IN THE EVENING UNTIL WELL, THEN 1 CAPSULE DAILY (Patient not taking: Reported on 08/08/2015) 90 capsule 3   No current facility-administered medications for this visit.    BP 110/64 mmHg  Pulse 74  Ht '5\' 8"'  (1.727 m)  Wt 129 lb (58.514 kg)  BMI 19.62 kg/m2 General: NAD Neck: No JVD, no thyromegaly or thyroid nodule.  Lungs: Clear to auscultation bilaterally with  normal respiratory effort. CV: Nondisplaced PMI.  Heart regular S1/S2, no O8/P2, mid systolic click with 3/6 mid to late systolic murmur at apex.  No peripheral edema.  No carotid bruit.  Normal pedal pulses.  Abdomen: Soft, nontender, no hepatosplenomegaly, no distention.  Skin: Intact without lesions or rashes.  Neurologic: Alert and oriented x 3.  Psych: Normal affect. Extremities: No clubbing or cyanosis.   Assessment/Plan: 1. Mitral regurgitation: Echo this month shows severe mitral valve prolapse with severe regurgitation.  The LV is mildly dilated, somewhat larger than prior echo.  I cannot estimate PA systolic pressure from this study.  I think that she has begun to develop some symptoms from MR at this point. We discussed her echo and symptoms, and I think she is nearing the point where she should have mitral valve repair.  - I will arrange for TEE and LHC/RHC.  She is going to let me know based on her schedule when this would work.  We discussed the procedures in depth and she understands risks/benefits.  She agrees to proceed. - After TEE and cath, will refer her to Dr Roxy Manns for evaluation for minimally invasive mitral valve repair.  2. Palpitations: Suspect due to PACs and atrial tachycardia. 30 day monitor in 8/16 did not show atrial fibrillation.   [name] 08/10/2015

## 2015-08-18 NOTE — Progress Notes (Signed)
Right brachial sheath removed, manual pressure held for 15 minutes; gauze and tegaderm dressing applied, clean, dry and intact, site level 0.  Right femoral 5Fr arterial and 7Fr venous sheaths removed, maunal pressure held for 20 minutes; gauze and tegaderm dressing applied, clean dry and intact, site level 0.  Right radial site remains level 0.  Patient's vital signs remain stable.  Post-sheath removal education provided to patient.  Report called to Threasa Beards, RN short stay.

## 2015-08-18 NOTE — Discharge Instructions (Signed)
Angiogram, Care After °These instructions give you information about caring for yourself after your procedure. Your doctor may also give you more specific instructions. Call your doctor if you have any problems or questions after your procedure.  °HOME CARE °· Take medicines only as told by your doctor. °· Follow your doctor's instructions about: °¨ Care of the area where the tube was inserted. °¨ Bandage (dressing) changes and removal. °· You may shower 24-48 hours after the procedure or as told by your doctor. °· Do not take baths, swim, or use a hot tub until your doctor approves. °· Every day, check the area where the tube was inserted. Watch for: °¨ Redness, swelling, or pain. °¨ Fluid, blood, or pus. °· Do not apply powder or lotion to the site. °· Do not lift anything that is heavier than 10 lb (4.5 kg) for 5 days or as told by your doctor. °· Ask your doctor when you can: °¨ Return to work or school. °¨ Do physical activities or play sports. °¨ Have sex. °· Do not drive or operate heavy machinery for 24 hours or as told by your doctor. °· Have someone with you for the first 24 hours after the procedure. °· Keep all follow-up visits as told by your doctor. This is important. °GET HELP IF: °· You have a fever.   °· You have chills.   °· You have more bleeding from the area where the tube was inserted. Hold pressure on the area. °· You have redness, swelling, or pain in the area where the tube was inserted. °· You have fluid or pus coming from the area. °GET HELP RIGHT AWAY IF:  °· You have a lot of pain in the area where the tube was inserted. °· The area where the tube was inserted is bleeding, and the bleeding does not stop after 30 minutes of holding steady pressure on the area. °· The area near or just beyond the insertion site becomes pale, cool, tingly, or numb. °  °This information is not intended to replace advice given to you by your health care provider. Make sure you discuss any questions you have  with your health care provider. °  °Document Released: 08/27/2008 Document Revised: 06/21/2014 Document Reviewed: 11/01/2012 °[employer] Interactive Patient Education ©2016 [employer] ° °Radial Site Care °Refer to this sheet in the next few weeks. These instructions provide you with information about caring for yourself after your procedure. Your health care provider may also give you more specific instructions. Your treatment has been planned according to current medical practices, but problems sometimes occur. Call your health care provider if you have any problems or questions after your procedure. °WHAT TO EXPECT AFTER THE PROCEDURE °After your procedure, it is typical to have the following: °· Bruising at the radial site that usually fades within 1-2 weeks. °· Blood collecting in the tissue (hematoma) that may be painful to the touch. It should usually decrease in size and tenderness within 1-2 weeks. °HOME CARE INSTRUCTIONS °· Take medicines only as directed by your health care provider. °· You may shower 24-48 hours after the procedure or as directed by your health care provider. Remove the bandage (dressing) and gently wash the site with plain soap and water. Pat the area dry with a clean towel. Do not rub the site, because this may cause bleeding. °· Do not take baths, swim, or use a hot tub until your health care provider approves. °· Check your insertion site every day for redness,   swelling, or drainage. °· Do not apply powder or lotion to the site. °· Do not flex or bend the affected arm for 24 hours or as directed by your health care provider. °· Do not push or pull heavy objects with the affected arm for 24 hours or as directed by your health care provider. °· Do not lift over 10 lb (4.5 kg) for 5 days after your procedure or as directed by your health care provider. °· Ask your health care provider when it is okay to: °¨ Return to work or school. °¨ Resume usual physical activities or  sports. °¨ Resume sexual activity. °· Do not drive home if you are discharged the same day as the procedure. Have someone else drive you. °· You may drive 24 hours after the procedure unless otherwise instructed by your health care provider. °· Do not operate machinery or power tools for 24 hours after the procedure. °· If your procedure was done as an outpatient procedure, which means that you went home the same day as your procedure, a responsible adult should be with you for the first 24 hours after you arrive home. °· Keep all follow-up visits as directed by your health care provider. This is important. °SEEK MEDICAL CARE IF: °· You have a fever. °· You have chills. °· You have increased bleeding from the radial site. Hold pressure on the site. °SEEK IMMEDIATE MEDICAL CARE IF: °· You have unusual pain at the radial site. °· You have redness, warmth, or swelling at the radial site. °· You have drainage (other than a small amount of blood on the dressing) from the radial site. °· The radial site is bleeding, and the bleeding does not stop after 30 minutes of holding steady pressure on the site. °· Your arm or hand becomes pale, cool, tingly, or numb. °  °This information is not intended to replace advice given to you by your health care provider. Make sure you discuss any questions you have with your health care provider. °  °Document Released: 07/03/2010 Document Revised: 06/21/2014 Document Reviewed: 12/17/2013 °[employer] Interactive Patient Education ©2016 [employer] ° °

## 2015-08-19 ENCOUNTER — Telehealth: Payer: Self-pay | Admitting: Cardiology

## 2015-08-19 MED FILL — Verapamil HCl IV Soln 2.5 MG/ML: INTRAVENOUS | Qty: 2 | Status: AC

## 2015-08-19 MED FILL — Heparin Sodium (Porcine) Inj 1000 Unit/ML: INTRAMUSCULAR | Qty: 10 | Status: AC

## 2015-08-19 NOTE — Telephone Encounter (Signed)
Patient wants to know what is the result of her testing yesterday and will she need a valve repair or replacement. Patient wants to know what her next step is going to be. Patient states that it is fine for Dr. Aundra Dubin to email her with these answers. Will forward to Dr. Aundra Dubin.

## 2015-08-19 NOTE — Telephone Encounter (Signed)
Spoke with pt and informed her of information provided by Dr. Aundra Dubin. Pt verbalized understanding and was in agreement with this plan. Pt appreciative of return call.

## 2015-08-19 NOTE — Telephone Encounter (Signed)
Please call,pt had test yesterday.Pt have some questions and concerns.

## 2015-08-19 NOTE — Telephone Encounter (Signed)
She has severe mitral regurgitation, no coronary disease.  She will need mitral valve repair, no bypass surgery necessary. Should be able to do minimally invasive repair.  She should get call from surgeon in the next day or two, call us back if she does not hear by Thursday.

## 2015-08-22 ENCOUNTER — Encounter: Payer: Self-pay | Admitting: Thoracic Surgery (Cardiothoracic Vascular Surgery)

## 2015-08-22 ENCOUNTER — Other Ambulatory Visit: Payer: Self-pay | Admitting: *Deleted

## 2015-08-22 ENCOUNTER — Institutional Professional Consult (permissible substitution) (INDEPENDENT_AMBULATORY_CARE_PROVIDER_SITE_OTHER): Payer: 59 | Admitting: Thoracic Surgery (Cardiothoracic Vascular Surgery)

## 2015-08-22 VITALS — BP 117/74 | HR 64 | Resp 16 | Ht 68.0 in | Wt 129.0 lb

## 2015-08-22 DIAGNOSIS — I340 Nonrheumatic mitral (valve) insufficiency: Secondary | ICD-10-CM | POA: Diagnosis not present

## 2015-08-22 DIAGNOSIS — I719 Aortic aneurysm of unspecified site, without rupture: Secondary | ICD-10-CM

## 2015-08-22 DIAGNOSIS — I7409 Other arterial embolism and thrombosis of abdominal aorta: Secondary | ICD-10-CM

## 2015-08-22 DIAGNOSIS — I341 Nonrheumatic mitral (valve) prolapse: Secondary | ICD-10-CM

## 2015-08-22 NOTE — Progress Notes (Signed)
[address]       [place] [address]             [phone]     CARDIOTHORACIC SURGERY CONSULTATION REPORT  Referring Provider is Larey Dresser, MD PCP is Joycelyn Man, MD  Chief Complaint  Patient presents with  . Mitral Regurgitation    eval for MVR.Marland KitchenECHO 07/24/15, CATH 08/19/15    HPI:  Patient is a 55 year old female with long-standing history of mitral valve prolapse who has been referred for surgical consultation to discuss treatment options for management of severe symptomatic mitral regurgitation. The patient states that she was first diagnosed with mitral valve prolapse approximately 30 years ago.  She denies any family history of mitral valve prolapse or collagen vascular disease, although her mother was reported to have rheumatic fever and underwent mitral valve surgery in the past. The patient has been followed for the last few years by Dr. Aundra Dubin. Transthoracic and transesophageal echocardiogram performed in 2015 revealed bileaflet prolapse with moderate to severe mitral regurgitation. Left ventricular size and function were normal. The patient was asymptomatic at that time. In 2016 the patient began to experience increasing palpitations. She underwent outpatient Holter monitor and was found to have frequent PACs and short runs of atrial tachycardia without any sustained arrhythmias or atrial fibrillation. She was started on Toprol-XL. Over the past 6 months patient has complained of worsening fatigue. She does not experience shortness of breath per se, although she claims that she feels as though she "can't take as deep a breath". She gets tired much more easily than she used to. She has occasional mild lightheadedness without syncope.  Symptoms have made it more difficult for her to sleep at night, although she denies any symptoms or orthopnea or PND. She has never had any lower extremity edema. She was seen in follow-up recently by Dr. Aundra Dubin.  Transthoracic echocardiogram revealed severe bileaflet prolapse with severe mitral regurgitation. Left ventricular systolic function was preserved. Transesophageal echocardiogram was performed 08/18/2015. This confirmed the presence of bileaflet prolapse with severe mitral regurgitation. There was systolic flow reversal in the pulmonary veins.  Left ventricular size and systolic function remained normal with ejection fraction estimated 55%. Left and right heart catheterization was notable for the absence of coronary artery disease. Pulmonary artery pressures were normal although there were large V waves seen on pulmonary capillary wedge tracing. Patient was referred for surgical consultation.  The patient is married and lives locally in [place] with her husband. She works full-time as a Academic librarian. She remains reasonably active physically although she admits that she does not exercise on a regular basis. She complains of progressive exertional fatigue. She does not spirits significant exertional shortness of breath. She denies any history of exertional chest pain or chest tightness. She denies any history of PND, orthopnea, or lower extremity edema. She has occasional mild dizzy spells without syncope. She has frequent palpitations although the symptoms have improved somewhat on beta blocker therapy.  Past Medical History  Diagnosis Date  . Allergy   . H/O multiple myeloma     clinical remission  . Mitral valve prolapse   . Hypertension   . Osteoporosis   . Constipation   . Mitral regurgitation   . Atrial tachycardia ([place]) 12/18/2014    24 hour event monitor with frequent PAC's, PVC's and short runs of atrial tachycardia - no sustained arrhythmias or atrial fibrillation     Past Surgical  History  Procedure Laterality Date  . Breast enhancement surgery  2000  . Oophorectomy  1991    left  . Limbal stem cell transplant  2008  . Cesarean section  1992  . Wisdom  tooth extraction  1975  . Tee without cardioversion N/A 09/05/2013    Procedure: TRANSESOPHAGEAL ECHOCARDIOGRAM (TEE);  Surgeon: Larey Dresser, MD;  Location: [facility] ENDOSCOPY;  Service: Cardiovascular;  Laterality: N/A;  . Cardiac catheterization N/A 08/18/2015    Procedure: Right/Left Heart Cath and Coronary Angiography;  Surgeon: Larey Dresser, MD;  Location: [facility] CV LAB;  Service: Cardiovascular;  Laterality: N/A;  . Tee without cardioversion N/A 08/18/2015    Procedure: TRANSESOPHAGEAL ECHOCARDIOGRAM (TEE);  Surgeon: Larey Dresser, MD;  Location: [facility] ENDOSCOPY;  Service: Cardiovascular;  Laterality: N/A;    Family History  Problem Relation Age of Onset  . Diabetes      family hx  . Hypertension      family hx  . Lymphoma      family hx  . Colon cancer Neg Hx   . Stomach cancer Neg Hx   . Non-Hodgkin's lymphoma Mother   . Cancer Mother   . Heart disease Mother     CHF  . Hypertension Father     Social History   Social History  . Marital Status: Married    Spouse Name: N/A  . Number of Children: N/A  . Years of Education: N/A   Occupational History  . Not on file.   Social History Main Topics  . Smoking status: Never Smoker   . Smokeless tobacco: Never Used  . Alcohol Use: 2.4 oz/week    4 Glasses of wine per week  . Drug Use: No  . Sexual Activity: Not on file   Other Topics Concern  . Not on file   Social History Narrative    Current Outpatient Prescriptions  Medication Sig Dispense Refill  . acyclovir (ZOVIRAX) 200 MG capsule TAKE 3 CAPSULES BY MOUTH IN THE MORNING & 2 CAPSULES IN THE EVENING UNTIL WELL, THEN 1 CAPSULE DAILY 90 capsule 3  . aspirin 81 MG tablet Take 81 mg by mouth daily.      . calcium carbonate (OS-CAL) 600 MG TABS Take 600 mg by mouth daily with breakfast.     . conjugated estrogens (PREMARIN) vaginal cream Place vaginally 2 (two) times a week. (Patient taking differently: Place 1 Applicatorful vaginally daily as needed (for hormone  supplementation). ) 42.5 g 11  . Fexofenadine HCl (ALLERGY 24-HR PO) Take 1 tablet by mouth daily as needed (for allergies).    Marland Kitchen lisinopril (PRINIVIL,ZESTRIL) 5 MG tablet TAKE 1 TABLET BY MOUTH EVERY MORNING 90 tablet 2  . metoprolol succinate (TOPROL XL) 25 MG 24 hr tablet Take 1 tablet (25 mg total) by mouth daily. 90 tablet 2  . Multiple Vitamins-Minerals (MULTIVITAL PO) Take 1 tablet by mouth daily.       No current facility-administered medications for this visit.    No Known Allergies    Review of Systems:   General:  normal appetite, decreased energy, no weight gain, no weight loss, no fever  Cardiac:  no chest pain with exertion, no chest pain at rest, no SOB with exertion, no resting SOB, no PND, no orthopnea, = palpitations, NO arrhythmia, NO atrial fibrillation, NO LE edema, + dizzy spells, no syncope  Respiratory:  no shortness of breath, no home oxygen, no productive cough, no dry cough, no bronchitis, no wheezing, no  hemoptysis, no asthma, no pain with inspiration or cough, no sleep apnea, no CPAP at night  GI:   no difficulty swallowing, no reflux, no frequent heartburn, no hiatal hernia, no abdominal pain, no constipation, no diarrhea, no hematochezia, no hematemesis, no melena  GU:   no dysuria,  no frequency, no urinary tract infection, no hematuria, no kidney stones, + mild chronic kidney disease  Vascular:  no pain suggestive of claudication, + pain in feet, + leg cramps, no varicose veins, no DVT, no non-healing foot ulcer  Neuro:   no stroke, no TIA's, no seizures, no headaches, no temporary blindness one eye,  no slurred speech, no peripheral neuropathy, + chronic pain, mild instability of gait, no memory/cognitive dysfunction  Musculoskeletal: no arthritis, + joint swelling, no myalgias, + difficulty walking related to problems with left foot, normal mobility   Skin:   no rash, no itching, no skin infections, no pressure sores or ulcerations  Psych:   no anxiety, no  depression, no nervousness, no unusual recent stress  Eyes:   no blurry vision, no floaters, no recent vision changes, + wears glasses or contacts  ENT:   no hearing loss, no loose or painful teeth, no dentures, last saw dentist within the past year  Hematologic:  + easy bruising, no abnormal bleeding, no clotting disorder, no frequent epistaxis  Endocrine:  no diabetes, does not check CBG's at home     Physical Exam:   BP 117/74 mmHg  Pulse 64  Resp 16  Ht '5\' 8"'  (1.727 m)  Wt 129 lb (58.514 kg)  BMI 19.62 kg/m2  SpO2 98%  General:  Thin,  well-appearing  HEENT:  Unremarkable   Neck:   no JVD, no bruits, no adenopathy   Chest:   clear to auscultation, symmetrical breath sounds, no wheezes, no rhonchi   CV:   RRR, grade III/VI holosystolic murmur   Abdomen:  soft, non-tender, no masses   Extremities:  warm, well-perfused, pulses diminished but palpable, no LE edema  Rectal/GU  Deferred  Neuro:   Grossly non-focal and symmetrical throughout  Skin:   Clean and dry, no rashes, no breakdown   Diagnostic Tests:  Transthoracic Echocardiography  Patient: Arianis, [place] MR #: [id number] Study Date: 07/24/2015 Gender: F Age: 1 Height: 175.3 cm Weight: 59 kg BSA: 1.69 m^2 Pt. Status: Room:  Elenore Paddy, M.D. REFERRING [name], M.D. SONOGRAPHER Cindy Hazy, RDCS ATTENDING Ena Dawley, M.D. PERFORMING [facility], Outpatient  cc:  ------------------------------------------------------------------- LV EF: 60% - 65%  ------------------------------------------------------------------- Indications: I05.9 Mitral Valve Disorder.  ------------------------------------------------------------------- History: PMH: Acquired from the patient and from the patient&'s chart. PMH: MVP. Atrial Tachycardia. Murmur.  ------------------------------------------------------------------- Study  Conclusions  - Left ventricle: The cavity size was normal. Systolic function was  normal. The estimated ejection fraction was in the range of 60%  to 65%. Wall motion was normal; there were no regional wall  motion abnormalities. Features are consistent with a pseudonormal  left ventricular filling pattern, with concomitant abnormal  relaxation and increased filling pressure (grade 2 diastolic  dysfunction). Doppler parameters are consistent with elevated  ventricular end-diastolic filling pressure. - Aortic valve: Trileaflet; mildly thickened leaflets. There was no  regurgitation. - Aortic root: The aortic root was normal in size. - Mitral valve: Mitral valve is myxomatous with severe thickening  and severe bileaflet prolapse. There was severe regurgitation. - Left atrium: The atrium was mildly dilated. - Right ventricle: The cavity size was normal. Wall thickness was  normal. Systolic  function was normal. - Tricuspid valve: There was trivial regurgitation. - Pulmonary arteries: Systolic pressure was within the normal  range. - Inferior vena cava: The vessel was normal in size. - Pericardium, extracardiac: There was no pericardial effusion.  Transthoracic echocardiography. M-mode, complete 2D, spectral Doppler, and color Doppler. Birthdate: Patient birthdate: 1961-05-17. Age: Patient is 55 yr old. Sex: Gender: female. BMI: 19.2 kg/m^2. Patient status: Outpatient. Study date: Study date: 07/24/2015. Study time: 04:13 PM. Location: [facility]  -------------------------------------------------------------------  ------------------------------------------------------------------- Left ventricle: The cavity size was normal. Systolic function was normal. The estimated ejection fraction was in the range of 60% to 65%. Wall motion was normal; there were no regional wall motion abnormalities. Features are consistent with a pseudonormal  left ventricular filling pattern, with concomitant abnormal relaxation and increased filling pressure (grade 2 diastolic dysfunction). Doppler parameters are consistent with elevated ventricular end-diastolic filling pressure.  ------------------------------------------------------------------- Aortic valve: Trileaflet; mildly thickened leaflets. Mobility was not restricted. Doppler: Transvalvular velocity was within the normal range. There was no stenosis. There was no regurgitation.  ------------------------------------------------------------------- Aorta: Aortic root: The aortic root was normal in size.  ------------------------------------------------------------------- Mitral valve: Mitral valve is myxomatous with severe thickening and severe bileaflet prolapse. Mobility was not restricted. Doppler: Transvalvular velocity was within the normal range. There was no evidence for stenosis. There was severe regurgitation. Valve area by pressure half-time: 1.95 cm^2. Indexed valve area by pressure half-time: 1.16 cm^2/m^2. Peak gradient (D): 4 mm Hg.  ------------------------------------------------------------------- Left atrium: The atrium was mildly dilated.  ------------------------------------------------------------------- Right ventricle: The cavity size was normal. Wall thickness was normal. Systolic function was normal.  ------------------------------------------------------------------- Pulmonic valve: Structurally normal valve. Cusp separation was normal. Doppler: Transvalvular velocity was within the normal range. There was no evidence for stenosis. There was no regurgitation.  ------------------------------------------------------------------- Tricuspid valve: Structurally normal valve. Doppler: Transvalvular velocity was within the normal range. There was trivial  regurgitation.  ------------------------------------------------------------------- Pulmonary artery: The main pulmonary artery was normal-sized. Systolic pressure was within the normal range.  ------------------------------------------------------------------- Right atrium: The atrium was normal in size.  ------------------------------------------------------------------- Pericardium: There was no pericardial effusion.  ------------------------------------------------------------------- Systemic veins: Inferior vena cava: The vessel was normal in size.  ------------------------------------------------------------------- Measurements  Left ventricle Value Reference LV ID, ED, PLAX chordal (H) 52.8 mm 43 - 52 LV ID, ES, PLAX chordal 30.5 mm 23 - 38 LV fx shortening, PLAX chordal 42 % >=29 LV PW thickness, ED 7.61 mm --------- IVS/LV PW ratio, ED 0.84 <=1.3 Stroke volume, 2D 48 ml --------- Stroke volume/bsa, 2D 28 ml/m^2 --------- LV e&', lateral 7.46 cm/s --------- LV E/e&', lateral 12.71 --------- LV e&', medial 6.2 cm/s --------- LV E/e&', medial 15.29 --------- LV e&', average 6.83 cm/s --------- LV E/e&', average 13.88 ---------  Ventricular septum Value Reference IVS thickness, ED 6.37 mm ---------  LVOT Value Reference LVOT ID, S 20 mm --------- LVOT area 3.14  cm^2 --------- LVOT peak velocity, S 86.5 cm/s --------- LVOT mean velocity, S 52 cm/s --------- LVOT VTI, S 15.3 cm ---------  Aorta Value Reference Aortic root ID, ED 30 mm ---------  Left atrium Value Reference LA ID, A-P, ES 38 mm --------- LA ID/bsa, A-P (H) 2.26 cm/m^2 <=2.2 LA volume, S 77.3 ml --------- LA volume/bsa, S 45.9 ml/m^2 --------- LA volume, ES, 1-p A4C 57.9 ml --------- LA volume/bsa, ES, 1-p A4C 34.4 ml/m^2 --------- LA volume, ES, 1-p A2C 97.4 ml --------- LA volume/bsa, ES, 1-p A2C 57.8 ml/m^2 ---------  Mitral valve Value Reference Mitral E-wave peak velocity  94.8 cm/s --------- Mitral A-wave peak velocity 63.7 cm/s --------- Mitral deceleration time (H) 243 ms 150 - 230 Mitral pressure half-time 113 ms --------- Mitral peak gradient, D 4 mm Hg --------- Mitral E/A ratio, peak 1.5 --------- Mitral valve area, PHT, DP 1.95 cm^2 --------- Mitral valve area/bsa, PHT, DP 1.16 cm^2/m^2 ---------  Right ventricle Value Reference TAPSE 26.1 mm --------- RV s&', lateral, S 14.2 cm/s ---------  Legend: (L) and (H) mark values outside specified reference  range.  ------------------------------------------------------------------- Prepared and Electronically Authenticated by  Ena Dawley, M.D. 2017-02-09T17:35:14   Transesophageal Echocardiography  Patient: Kenyette, Gundy MR #: [id number] Study Date: 08/18/2015 Gender: F Age: 55 Height: 172.7 cm Weight: 58.6 kg BSA: 1.67 m^2 Pt. Status: Room:  Elenore Paddy, M.D. PERFORMING [name], M.D. SONOGRAPHER Johny Chess, RDCS, CCT ADMITTING Skeet Latch, MD [place], MD  cc:  ------------------------------------------------------------------- LV EF: 55%  ------------------------------------------------------------------- Indications: Mitral regurgitation 424.0.  ------------------------------------------------------------------- Study Conclusions  - Left ventricle: The cavity size was mildly dilated. Wall  thickness was normal. The estimated ejection fraction was 55%.  Wall motion was normal; there were no regional wall motion  abnormalities. - Aortic valve: There was no stenosis. - Aorta: Normal caliber aorta without significant plaque. - Mitral valve: The mitral valve was thickened with bileaflet  prolapse. There were multiple jets (appeared to be 3) of mitral  regurgitation, appears severe. PISA not done with multiple jets.  There was systolic flow reversal in the pulmonary vein doppler  pattern. - Left atrium: The atrium was mildly dilated. No evidence of  thrombus in the atrial cavity or appendage. - Right ventricle: The cavity size was normal. Systolic function  was normal. - Right atrium: No evidence of thrombus in the atrial cavity or  appendage. - Atrial septum: No defect or patent foramen ovale was identified.  Echo contrast study showed no right-to-left atrial level shunt,  at baseline or with provocation.  Diagnostic  transesophageal echocardiography. 2D and color Doppler. Birthdate: Patient birthdate: April 05, 1961. Age: Patient is 55 yr old. Sex: Gender: female. BMI: 19.7 kg/m^2. Blood pressure:  129/71 Patient status: Outpatient. Study date: Study date: 08/18/2015. Study time: 08:08 AM. Location: Endoscopy.  -------------------------------------------------------------------  ------------------------------------------------------------------- Left ventricle: The cavity size was mildly dilated. Wall thickness was normal. The estimated ejection fraction was 55%. Wall motion was normal; there were no regional wall motion abnormalities.  ------------------------------------------------------------------- Aortic valve: Trileaflet. Doppler: There was no stenosis. There was no regurgitation.  ------------------------------------------------------------------- Aorta: Normal caliber aorta without significant plaque.  ------------------------------------------------------------------- Mitral valve: The mitral valve was thickened with bileaflet prolapse. There were multiple jets (appeared to be 3) of mitral regurgitation, appears severe. PISA not done with multiple jets. There was systolic flow reversal in the pulmonary vein doppler pattern.  ------------------------------------------------------------------- Left atrium: The atrium was mildly dilated. No evidence of thrombus in the atrial cavity or appendage.  ------------------------------------------------------------------- Atrial septum: No defect or patent foramen ovale was identified. Echo contrast study showed no right-to-left atrial level shunt, at baseline or with provocation.  ------------------------------------------------------------------- Right ventricle: The cavity size was normal. Systolic function was normal.  ------------------------------------------------------------------- Pulmonic  valve: Structurally normal valve. Cusp separation was normal.  ------------------------------------------------------------------- Tricuspid valve: Doppler: There was no significant regurgitation.  ------------------------------------------------------------------- Right atrium: The atrium was normal in size. No evidence of thrombus in the atrial cavity or appendage.  ------------------------------------------------------------------- Pericardium: There was no pericardial effusion.  ------------------------------------------------------------------- Post procedure conclusions Ascending Aorta:  - Normal caliber aorta without significant plaque.  ------------------------------------------------------------------- Prepared and  Electronically Authenticated by  [name], M.D. 2017-03-07T16:54:59       CARDIAC CATHETERIZATION Procedures    Right/Left Heart Cath and Coronary Angiography    Conclusion    1. Normal left and right heart filling pressures with prominent v-waves noted on PCWP pressure tracing.  2. No angiographic coronary artery disease.  3. 3+ mitral regurgitation noted.   Will refer for evaluation for minimally invasive mitral valve repair.     Technique and Indications    Procedure: Right Heart Cath, Left Heart Cath, Selective Coronary Angiography, LV angiography  Indication: Mitral regurgitation.   Procedural Details: The right brachial and radial areas and right groin were prepped, draped, and anesthetized with 1% lidocaine. I was unable to pass the Swan via brachial access due to small caliber vein. I was able to enter the right radial artery but unable to pass wire, again due to small caliber artery (confirmed by ultrasound). Using the modified Seldinger technique a 5 French sheath was placed in the right femoral artery and a 7 French sheath was placed in the right femoral vein. A Swan-Ganz catheter was used for the right heart  catheterization. Standard protocol was followed for recording of right heart pressures and sampling of oxygen saturations. Fick cardiac output was calculated. Standard Judkins catheters were used for selective coronary angiography and left ventriculography. There were no immediate procedural complications. The patient was transferred to the post catheterization recovery area for further monitoring.Estimated blood loss <50 mL.    Coronary Findings    Dominance: Right   Left Main  Short, no coronary disease.     Left Anterior Descending  No angiographic coronary disease.     Left Circumflex  No angiographic coronary disease.     Right Coronary Artery  No angiographic coronary disease.       Right Heart Pressures RHC Procedural Findings: Hemodynamics (mmHg) RA mean 2 RV 22/4 PA 20/6, mean 13 PCWP mean 9 with prominent V-waves LV 108/10 AO 109/54  Oxygen saturations: PA 72% AO 100%  Cardiac Output (Fick) 3.93  Cardiac Index (Fick) 2.31    Wall Motion    EF 55-60%. 3+ mitral regurgitation.           Implants     No implant documentation for this case.    PACS Images    Show images for Cardiac catheterization     Link to Procedure Log    Procedure Log      Hemo Data       Most Recent Value   Fick Cardiac Output  3.93 L/min   Fick Cardiac Output Index  2.31 (L/min)/BSA   RA A Wave  5 mmHg   RA V Wave  3 mmHg   RA Mean  2 mmHg   RV Systolic Pressure  22 mmHg   RV Diastolic Pressure  0 mmHg   RV EDP  4 mmHg   PA Systolic Pressure  20 mmHg   PA Diastolic Pressure  6 mmHg   PA Mean  13 mmHg   PW A Wave  14 mmHg   PW V Wave  16 mmHg   PW Mean  9 mmHg   AO Systolic Pressure  831 mmHg   AO Diastolic Pressure  54 mmHg   AO Mean  76 mmHg   LV Systolic Pressure  95 mmHg   LV Diastolic Pressure  3 mmHg   LV EDP  10 mmHg   Arterial Occlusion Pressure Extended Systolic Pressure  96 mmHg  Arterial Occlusion Pressure  Extended Diastolic Pressure  42 mmHg   Arterial Occlusion Pressure Extended Mean Pressure  64 mmHg   Left Ventricular Apex Extended Systolic Pressure  037 mmHg   Left Ventricular Apex Extended Diastolic Pressure  6 mmHg   Left Ventricular Apex Extended EDP Pressure  11 mmHg   QP/QS  1   TPVR Index  5.63 HRUI   TSVR Index  32.86 HRUI   PVR SVR Ratio  0.05   TPVR/TSVR Ratio  0.17       Impression:  Patient has stage D severe symptomatic primary mitral regurgitation. 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  She has recently developed worsening symptoms of fatigue and decreased exercise tolerance, consistent with chronic diastolic congestive heart failure, New York Heart Association function class I-II.  She additionally experiences frequent palpitations and atypical chest discomfort.  I have personally reviewed the patient's recent echocardiograms and diagnostic cardiac catheterization. She has billowing bileaflet prolapse with large redundant mitral valve leaflets and severe mitral regurgitation. Left ventricular size and systolic function remains normal. Based upon review of the patient's recent transesophageal echocardiogram I feel there remains a very high likelihood that her valve should be repairable with low associated operative risks.   Plan:  The patient and her husband were counseled at length regarding the indications, risks and potential benefits of mitral valve repair.  The rationale for elective surgery has been explained, including a comparison between surgery and continued medical therapy with close follow-up.  The likelihood of successful and durable valve repair has been discussed with particular reference to the findings of their recent echocardiogram.  Based upon these findings and previous experience, I have quoted them a greater than 90 percent likelihood of successful valve repair.  In  the unlikely event that their valve cannot be successfully repaired, we discussed the possibility of replacing the mitral valve using a mechanical prosthesis with the attendant need for long-term anticoagulation versus the alternative of replacing it using a bioprosthetic tissue valve with its potential for late structural valve deterioration and failure, depending upon the patient's longevity.    The patient understands and accepts all potential risks of surgery including but not limited to risk of death, stroke or other neurologic complication, myocardial infarction, congestive heart failure, respiratory failure, renal failure, bleeding requiring transfusion and/or reexploration, arrhythmia, infection or other wound complications, pneumonia, pleural and/or pericardial effusion, pulmonary embolus, aortic dissection or other major vascular complication, or delayed complications related to valve repair or replacement including but not limited to structural valve deterioration and failure, thrombosis, embolization, endocarditis, or paravalvular leak.  Alternative surgical approaches have been discussed including a comparison between conventional sternotomy and minimally-invasive techniques.  The relative risks and benefits of each have been reviewed as they pertain to the patient's specific circumstances, and all of their questions have been addressed.  Specific risks potentially related to the minimally-invasive approach were discussed at length, including but not limited to risk of conversion to full or partial sternotomy, aortic dissection or other major vascular complication, unilateral acute lung injury or pulmonary edema, phrenic nerve dysfunction or paralysis, rib fracture, chronic pain, lung hernia, or lymphocele.  Expectations for the patient's postoperative convalescence have been discussed.  All of their questions have been answered.  The patient wants to discuss the timing of surgery further with her  husband and family. She tentatively plans to proceed with surgery sometime within the next month or 2. We will  obtain a CT angiogram of the aorta and iliac vessels to evaluate the feasibility of peripheral cannulation for surgery. The patient will call to schedule follow-up appointment when she has made a decision when she would like to proceed with elective surgery.   I spent in excess of 90 minutes during the conduct of this office consultation and >50% of this time involved direct face-to-face encounter with the patient for counseling and/or coordination of their care.   Valentina Gu. Roxy Manns, MD 08/22/2015 11:59 AM

## 2015-08-22 NOTE — Patient Instructions (Signed)
Continue all previous medications without any changes at this time  

## 2015-08-25 ENCOUNTER — Other Ambulatory Visit: Payer: Self-pay | Admitting: *Deleted

## 2015-08-25 DIAGNOSIS — I340 Nonrheumatic mitral (valve) insufficiency: Secondary | ICD-10-CM

## 2015-08-25 MED ORDER — GLUTARALDEHYDE 0.625% SOAKING SOLUTION: TOPICAL | Status: DC | PRN

## 2015-09-01 ENCOUNTER — Other Ambulatory Visit: Payer: 59

## 2015-09-02 ENCOUNTER — Ambulatory Visit
Admission: RE | Admit: 2015-09-02 | Discharge: 2015-09-02 | Disposition: A | Discharge status: Home / Self Care | Payer: 59 | Source: Ambulatory Visit | Attending: Thoracic Surgery (Cardiothoracic Vascular Surgery) | Admitting: Thoracic Surgery (Cardiothoracic Vascular Surgery)

## 2015-09-02 DIAGNOSIS — I719 Aortic aneurysm of unspecified site, without rupture: Secondary | ICD-10-CM

## 2015-09-02 DIAGNOSIS — I7409 Other arterial embolism and thrombosis of abdominal aorta: Secondary | ICD-10-CM

## 2015-09-02 MED ORDER — IOPAMIDOL (ISOVUE-370) INJECTION 76%
75.0000 mL | Freq: Once | INTRAVENOUS | Status: AC | PRN
  Administered 2015-09-02: 75 mL via INTRAVENOUS

## 2015-09-11 ENCOUNTER — Telehealth: Payer: Self-pay | Admitting: *Deleted

## 2015-09-11 NOTE — Telephone Encounter (Signed)
Return call placed to patient to inform her that the surgical center states that they are unable to draw labs that are ordered here at [facility] for her 10/13/15 appointment at her pre op visit on 10/10/15.  Pt appreciative of call and has no further questions or concerns at this time.

## 2015-09-26 ENCOUNTER — Other Ambulatory Visit: Payer: Self-pay | Admitting: Cardiology

## 2015-10-06 ENCOUNTER — Encounter: Payer: Self-pay | Admitting: Thoracic Surgery (Cardiothoracic Vascular Surgery)

## 2015-10-06 ENCOUNTER — Ambulatory Visit (INDEPENDENT_AMBULATORY_CARE_PROVIDER_SITE_OTHER): Payer: BLUE CROSS/BLUE SHIELD | Admitting: Thoracic Surgery (Cardiothoracic Vascular Surgery)

## 2015-10-06 VITALS — BP 125/82 | HR 61 | Resp 20 | Ht 68.0 in | Wt 129.0 lb

## 2015-10-06 DIAGNOSIS — I341 Nonrheumatic mitral (valve) prolapse: Secondary | ICD-10-CM

## 2015-10-06 DIAGNOSIS — I340 Nonrheumatic mitral (valve) insufficiency: Secondary | ICD-10-CM

## 2015-10-06 MED ORDER — AMIODARONE HCL 200 MG PO TABS: 200.0000 mg | ORAL_TABLET | Freq: Every day | ORAL | Status: DC

## 2015-10-06 NOTE — Patient Instructions (Signed)
Patient has been instructed to stop taking aspirin  Patient should continue taking all other medications without change through the day before surgery.  Patient should have nothing to eat or drink after midnight the night before surgery.  On the morning of surgery patient should take only Toprol XL with a sip of water.

## 2015-10-06 NOTE — Progress Notes (Signed)
[address]       [place] [address]             [phone]     CARDIOTHORACIC SURGERY OFFICE NOTE  Referring Provider is Larey Dresser, MD PCP is Joycelyn Man, MD   HPI:  Patient returns to the office today for follow-up with tentative plans to proceed with minimally invasive mitral valve repair next week. She was originally seen in consultation on 08/22/2015. Since then she underwent CT angiography. She reports no new problems or complaints, although this past weekend she was working outdoors in her yard. Since then she has developed nasal congestion and a dry scratchy throat. She has not had fevers, chills, or productive cough. She otherwise remains clinically stable with no new problems or complaints.   Current Outpatient Prescriptions  Medication Sig Dispense Refill  . acyclovir (ZOVIRAX) 200 MG capsule TAKE 3 CAPSULES BY MOUTH IN THE MORNING & 2 CAPSULES IN THE EVENING UNTIL WELL, THEN 1 CAPSULE DAILY 90 capsule 3  . aspirin 81 MG tablet Take 81 mg by mouth daily.      . calcium carbonate (OS-CAL) 600 MG TABS Take 600 mg by mouth daily with breakfast.     . conjugated estrogens (PREMARIN) vaginal cream Place vaginally 2 (two) times a week. (Patient taking differently: Place 1 Applicatorful vaginally daily as needed (for hormone supplementation). ) 42.5 g 11  . Fexofenadine HCl (ALLERGY 24-HR PO) Take 1 tablet by mouth daily as needed (for allergies).    Marland Kitchen lisinopril (PRINIVIL,ZESTRIL) 5 MG tablet TAKE 1 TABLET BY MOUTH EVERY MORNING 90 tablet 2  . metoprolol succinate (TOPROL-XL) 25 MG 24 hr tablet TAKE 1 TABLET (25 MG TOTAL) BY MOUTH DAILY. 90 tablet 1  . Multiple Vitamins-Minerals (MULTIVITAL PO) Take 1 tablet by mouth daily.       No current facility-administered medications for this visit.   Facility-Administered Medications Ordered in Other Visits  Medication Dose Route Frequency Provider Last Rate Last Dose  . glutaraldehyde 0.625%  soaking solution   Topical PRN Rexene Alberts, MD          Physical Exam:   BP 125/82 mmHg  Pulse 61  Resp 20  Ht '5\' 8"'  (1.727 m)  Wt 129 lb (58.514 kg)  BMI 19.62 kg/m2  SpO2 98%  General:  Well-appearing  Chest:   Clear to auscultation  CV:   Regular rate and rhythm with systolic murmur  Incisions:  n/a  Abdomen:  Soft and nontender  Extremities:  Warm and well-perfused  Diagnostic Tests:  CT ANGIOGRAPHY CHEST, ABDOMEN AND PELVIS  TECHNIQUE: Multidetector CT imaging through the chest, abdomen and pelvis was performed using the standard protocol during bolus administration of intravenous contrast. Multiplanar reconstructed images and MIPs were obtained and reviewed to evaluate the vascular anatomy.  CONTRAST: 75 mL Isovue 370  COMPARISON: CT 04/14/2006 and bone survey 01/20/2011  FINDINGS: CTA CHEST FINDINGS  Vascular structures: Small amount of calcified plaque at the origin of the left anterior descending coronary artery. Normal caliber of the thoracic aorta without dissection. The mid ascending thoracic aorta measures 2.5 cm. The great vessels are patent. Compression of the subclavian arteries at the thoracic outlet on both sides probably related to the arm positioning. Typical arch anatomy. Mid descending thoracic aorta measures 2.1 cm. Normal caliber of the pulmonary arteries but there is no significant contrast within the pulmonary arteries. Left ventricle is dilated.  No significant pericardial or pleural  fluid. Small amount of fluid in the superior pericardial recess. No chest lymphadenopathy. Thyroid tissue is heterogeneous with a few small nodules. Largest visualized nodule measures 0.7 cm. Patient has bilateral breast implants.  Trachea and mainstem bronchi are patent. Both lungs are clear. No suspicious osseous findings.  Review of the MIP images confirms the above findings.  CTA ABDOMEN AND PELVIS FINDINGS  Vascular structures:  Normal caliber of the abdominal aorta with mild atherosclerotic disease. Negative for an aortic dissection. Celiac trunk, SMA and IMA are patent. Bilateral renal arteries are widely patent. There is also an inferior accessory left renal artery. Small amount of calcified plaque involving the common iliac arteries. Overall, iliac arteries are widely patent. Proximal femoral arteries are widely patent. There is a small amount of fluid around the right common femoral artery and related to recent catheterization. No evidence for a pseudoaneurysm or hematoma in the right groin. 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  0.7 cm low-density structure between the left and right hepatic lobes is indeterminate but probably represents a benign etiology such as a cyst. Normal appearance of the gallbladder. Normal appearance of the pancreas without inflammation or duct dilatation. Normal appearance of spleen without enlargement. Normal appearance of the adrenal glands. 4.1 cm low-density cyst involving the right kidney lower pole. No hydronephrosis. Normal appearance of the urinary bladder. Uterus and adnexal structures are poorly characterized on this arterial phase of contrast but no gross abnormality. No acute abnormality involving the stomach, small bowel or colon. No suspicious lymphadenopathy in the abdomen or pelvis. There is a large lytic lesion or contiguous lesions involving the left ilium. There has been some interval sclerosis and healing of this lesion since the exam in 2007. There appears to be increased lucency along the more superior posterior aspect of the left ilium but findings are most compatible with an old lesion. Disc space narrowing and vacuum disc phenomena at L5-S1.  Review of the MIP images confirms the above findings.  IMPRESSION: Minimal atherosclerotic disease involving the aorta and iliac arteries. No significant stenosis. No evidence for  an aortic aneurysm.  Expected changes from recent right groin catheterization. Negative for pseudoaneurysm or hematoma.  Left ventricular dilatation compatible with history of severe mitral regurgitation.  Left sided IVC.  Chronic lytic lesion in the left ilium compatible with history of multiple myeloma. No new suspicious bone lesions.  No acute abnormalities in the chest, abdomen or pelvis.   Electronically Signed  By: Markus Daft M.D.  On: 09/02/2015 09:14   Impression:  Patient has stage D severe symptomatic primary mitral regurgitation. 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 She has recently developed worsening symptoms of fatigue and decreased exercise tolerance, consistent with chronic diastolic congestive heart failure, New York Heart Association function class I-II. She additionally experiences frequent palpitations and atypical chest discomfort. I have personally reviewed the patient's recent echocardiograms and diagnostic cardiac catheterization. She has billowing bileaflet prolapse with large redundant mitral valve leaflets and severe mitral regurgitation. Left ventricular size and systolic function remains normal. Based upon review of the patient's recent transesophageal echocardiogram I feel there remains a very high likelihood that her valve should be repairable with low associated operative risks.  The patient appears to be a relatively good candidate for minimally invasive approach for surgery. 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   Plan:  The patient was again counseled at length  regarding the indications, risks, and potential benefits of mitral valve repair.     The patient understands and accepts all potential risks of surgery including but not limited to risk of death, stroke or other neurologic complication, myocardial infarction, congestive  heart failure, respiratory failure, renal failure, bleeding requiring transfusion and/or reexploration, arrhythmia, infection or other wound complications, pneumonia, pleural and/or pericardial effusion, pulmonary embolus, aortic dissection or other major vascular complication, or delayed complications related to valve repair or replacement including but not limited to structural valve deterioration and failure, thrombosis, embolization, endocarditis, or paravalvular leak.  Alternative surgical approaches have been discussed including a comparison between conventional sternotomy and minimally-invasive techniques.  The relative risks and benefits of each have been reviewed as they pertain to the patient's specific circumstances, and all of their questions have been addressed.  Specific risks potentially related to the minimally-invasive approach were discussed at length, including but not limited to risk of conversion to full or partial sternotomy, aortic dissection or other major vascular complication, unilateral acute lung injury or pulmonary edema, phrenic nerve dysfunction or paralysis, rib fracture, chronic pain, lung hernia, or lymphocele.   All of her questions have been answered.  She has been given a prescription for amiodarone to begin tomorrow to decrease her risk of atrial dysrhythmias following surgery. She has been instructed to stop taking aspirin in anticipation of surgery. On the morning of surgery she will take only Toprol-XL with a sip of water.    I spent in excess of 30 minutes during the conduct of this office consultation and >50% of this time involved direct face-to-face encounter with the patient for counseling and/or coordination of their care.    Valentina Gu. Roxy Manns, MD 10/06/2015 10:06 AM

## 2015-10-09 NOTE — Pre-Procedure Instructions (Signed)
    Cristina Garcia  10/09/2015      [id number] [address], [place] [address] [place] [address] Phone: [phone] Fax: [phone]    Your procedure is scheduled on 10/15/15.  Report to [facility] Admitting at 630 A.M.  Call this number if you have problems the morning of surgery:  [phone]   Remember:  Do not eat food or drink liquids after midnight.  Take these medicines the morning of surgery with A SIP OF WATER --amiodarone,premarin,metoprolol   Do not wear jewelry, make-up or nail polish.  Do not wear lotions, powders, or perfumes.  You may wear deodorant.  Do not shave 48 hours prior to surgery.  Men may shave face and neck.  Do not bring valuables to the hospital.  [facility] is not responsible for any belongings or valuables.  Contacts, dentures or bridgework may not be worn into surgery.  Leave your suitcase in the car.  After surgery it may be brought to your room.  For patients admitted to the hospital, discharge time will be determined by your treatment team.  Patients discharged the day of surgery will not be allowed to drive home.   Name and phone number of your driver:   Special instructions:   Please read over the following fact sheets that you were given. Pain Booklet, Coughing and Deep Breathing, Blood Transfusion Information, MRSA Information and Surgical Site Infection Prevention

## 2015-10-10 ENCOUNTER — Encounter (HOSPITAL_COMMUNITY)
Admission: RE | Admit: 2015-10-10 | Discharge: 2015-10-10 | Disposition: A | Discharge status: Home / Self Care | Payer: BLUE CROSS/BLUE SHIELD | Source: Ambulatory Visit | Attending: Thoracic Surgery (Cardiothoracic Vascular Surgery) | Admitting: Thoracic Surgery (Cardiothoracic Vascular Surgery)

## 2015-10-10 ENCOUNTER — Ambulatory Visit (HOSPITAL_COMMUNITY)
Admission: RE | Admit: 2015-10-10 | Discharge: 2015-10-10 | Disposition: A | Discharge status: Home / Self Care | Payer: BLUE CROSS/BLUE SHIELD | Source: Ambulatory Visit | Attending: Thoracic Surgery (Cardiothoracic Vascular Surgery) | Admitting: Thoracic Surgery (Cardiothoracic Vascular Surgery)

## 2015-10-10 ENCOUNTER — Ambulatory Visit (HOSPITAL_BASED_OUTPATIENT_CLINIC_OR_DEPARTMENT_OTHER)
Admission: RE | Admit: 2015-10-10 | Discharge: 2015-10-10 | Disposition: A | Discharge status: Home / Self Care | Payer: BLUE CROSS/BLUE SHIELD | Source: Ambulatory Visit | Attending: Thoracic Surgery (Cardiothoracic Vascular Surgery) | Admitting: Thoracic Surgery (Cardiothoracic Vascular Surgery)

## 2015-10-10 ENCOUNTER — Ambulatory Visit (HOSPITAL_COMMUNITY): Admission: RE | Admit: 2015-10-10 | Payer: BLUE CROSS/BLUE SHIELD | Source: Ambulatory Visit

## 2015-10-10 ENCOUNTER — Encounter (HOSPITAL_COMMUNITY): Payer: Self-pay

## 2015-10-10 VITALS — BP 111/62 | HR 67 | Temp 98.0°F | Resp 20 | Ht 69.0 in | Wt 128.7 lb

## 2015-10-10 DIAGNOSIS — I340 Nonrheumatic mitral (valve) insufficiency: Secondary | ICD-10-CM

## 2015-10-10 DIAGNOSIS — I6523 Occlusion and stenosis of bilateral carotid arteries: Secondary | ICD-10-CM | POA: Diagnosis not present

## 2015-10-10 DIAGNOSIS — Z01818 Encounter for other preprocedural examination: Secondary | ICD-10-CM | POA: Diagnosis not present

## 2015-10-10 HISTORY — DX: Anxiety disorder, unspecified: F41.9

## 2015-10-10 LAB — PULMONARY FUNCTION TEST
DL/VA % PRED: 97 %
DL/VA: 5.3 ml/min/mmHg/L
DLCO unc % pred: 85 %
DLCO unc: 27.64 ml/min/mmHg
FEF 25-75 POST: 2.49 L/s
FEF 25-75 PRE: 1.73 L/s
FEF2575-%CHANGE-POST: 43 %
FEF2575-%PRED-POST: 87 %
FEF2575-%PRED-PRE: 60 %
FEV1-%Change-Post: 10 %
FEV1-%PRED-POST: 85 %
FEV1-%Pred-Pre: 78 %
FEV1-Post: 2.79 L
FEV1-Pre: 2.54 L
FEV1FVC-%CHANGE-POST: 10 %
FEV1FVC-%Pred-Pre: 88 %
FEV6-%CHANGE-POST: -1 %
FEV6-%Pred-Post: 88 %
FEV6-%Pred-Pre: 90 %
FEV6-PRE: 3.63 L
FEV6-Post: 3.58 L
FEV6FVC-%Change-Post: 0 %
FEV6FVC-%Pred-Post: 103 %
FEV6FVC-%Pred-Pre: 102 %
FVC-%CHANGE-POST: 0 %
FVC-%PRED-POST: 86 %
FVC-%PRED-PRE: 87 %
FVC-POST: 3.61 L
FVC-PRE: 3.64 L
POST FEV1/FVC RATIO: 77 %
PRE FEV1/FVC RATIO: 70 %
Post FEV6/FVC ratio: 100 %
Pre FEV6/FVC Ratio: 100 %
RV % pred: 100 %
RV: 2.24 L
TLC % pred: 93 %
TLC: 5.57 L

## 2015-10-10 LAB — COMPREHENSIVE METABOLIC PANEL
ALT: 19 U/L (ref 14–54)
ANION GAP: 14 (ref 5–15)
AST: 25 U/L (ref 15–41)
Albumin: 4.3 g/dL (ref 3.5–5.0)
Alkaline Phosphatase: 49 U/L (ref 38–126)
BUN: 18 mg/dL (ref 6–20)
CALCIUM: 10.4 mg/dL — AB (ref 8.9–10.3)
CHLORIDE: 102 mmol/L (ref 101–111)
CO2: 19 mmol/L — AB (ref 22–32)
Creatinine, Ser: 1.31 mg/dL — ABNORMAL HIGH (ref 0.44–1.00)
GFR calc non Af Amer: 45 mL/min — ABNORMAL LOW (ref >60)
GFR, EST AFRICAN AMERICAN: 52 mL/min — AB (ref >60)
Glucose, Bld: 92 mg/dL (ref 65–99)
Potassium: 4.1 mmol/L (ref 3.5–5.1)
SODIUM: 135 mmol/L (ref 135–145)
Total Bilirubin: 0.9 mg/dL (ref 0.3–1.2)
Total Protein: 6.9 g/dL (ref 6.5–8.1)

## 2015-10-10 LAB — BLOOD GAS, ARTERIAL
ACID-BASE DEFICIT: 0.9 mmol/L (ref 0.0–2.0)
Bicarbonate: 23 mEq/L (ref 20.0–24.0)
Drawn by: 449841
FIO2: 0.21
O2 SAT: 97.6 %
PO2 ART: 101 mmHg — AB (ref 80.0–100.0)
Patient temperature: 98.6
TCO2: 24.1 mmol/L (ref 0–100)
pCO2 arterial: 36.2 mmHg (ref 35.0–45.0)
pH, Arterial: 7.419 (ref 7.350–7.450)

## 2015-10-10 LAB — ABO/RH: ABO/RH(D): A NEG

## 2015-10-10 LAB — URINALYSIS, ROUTINE W REFLEX MICROSCOPIC
Bilirubin Urine: NEGATIVE
GLUCOSE, UA: NEGATIVE mg/dL
Hgb urine dipstick: NEGATIVE
Ketones, ur: NEGATIVE mg/dL
Leukocytes, UA: NEGATIVE
Nitrite: NEGATIVE
PH: 7 (ref 5.0–8.0)
PROTEIN: NEGATIVE mg/dL
Specific Gravity, Urine: 1.006 (ref 1.005–1.030)

## 2015-10-10 LAB — SURGICAL PCR SCREEN
MRSA, PCR: NEGATIVE
Staphylococcus aureus: NEGATIVE

## 2015-10-10 LAB — TYPE AND SCREEN
ABO/RH(D): A NEG
Antibody Screen: NEGATIVE

## 2015-10-10 LAB — PROTIME-INR
INR: 1.03 (ref 0.00–1.49)
Prothrombin Time: 13.7 seconds (ref 11.6–15.2)

## 2015-10-10 LAB — CBC
HCT: 43.4 % (ref 36.0–46.0)
Hemoglobin: 14.9 g/dL (ref 12.0–15.0)
MCH: 32.4 pg (ref 26.0–34.0)
MCHC: 34.3 g/dL (ref 30.0–36.0)
MCV: 94.3 fL (ref 78.0–100.0)
PLATELETS: 149 10*3/uL — AB (ref 150–400)
RBC: 4.6 MIL/uL (ref 3.87–5.11)
RDW: 12.5 % (ref 11.5–15.5)
WBC: 5.2 10*3/uL (ref 4.0–10.5)

## 2015-10-10 LAB — APTT: APTT: 29 s (ref 24–37)

## 2015-10-10 MED ORDER — ALBUTEROL SULFATE (2.5 MG/3ML) 0.083% IN NEBU
2.5000 mg | INHALATION_SOLUTION | Freq: Once | RESPIRATORY_TRACT | Status: AC
  Administered 2015-10-10: 2.5 mg via RESPIRATORY_TRACT

## 2015-10-10 NOTE — Progress Notes (Signed)
Pre-op Cardiac Surgery  Carotid Findings:  Bilateral: No significant (1-39%) ICA stenosis. Antegrade vertebral flow.    Upper Extremity Right Left  Brachial Pressures 109 104  Radial Waveforms Tri Tri  Ulnar Waveforms Tri Tri  Palmar Arch (Allen's Test) Obliterates with radial compression, normal with ulnar compression Obliterates with radial compression, decreases >50% with ulnar compression    Landry Mellow, RDMS, RVT 10/10/2015

## 2015-10-11 LAB — HEMOGLOBIN A1C
Hgb A1c MFr Bld: 5.1 % (ref 4.8–5.6)
MEAN PLASMA GLUCOSE: 100 mg/dL

## 2015-10-13 ENCOUNTER — Ambulatory Visit (HOSPITAL_BASED_OUTPATIENT_CLINIC_OR_DEPARTMENT_OTHER): Payer: BLUE CROSS/BLUE SHIELD | Admitting: Oncology

## 2015-10-13 ENCOUNTER — Telehealth: Payer: Self-pay | Admitting: Oncology

## 2015-10-13 ENCOUNTER — Other Ambulatory Visit (HOSPITAL_BASED_OUTPATIENT_CLINIC_OR_DEPARTMENT_OTHER): Payer: BLUE CROSS/BLUE SHIELD

## 2015-10-13 VITALS — BP 126/66 | HR 78 | Temp 97.9°F | Resp 18 | Ht 69.0 in | Wt 128.7 lb

## 2015-10-13 DIAGNOSIS — C9001 Multiple myeloma in remission: Secondary | ICD-10-CM

## 2015-10-13 LAB — CBC WITH DIFFERENTIAL/PLATELET
BASO%: 0.7 % (ref 0.0–2.0)
BASOS ABS: 0 10*3/uL (ref 0.0–0.1)
EOS%: 0.7 % (ref 0.0–7.0)
Eosinophils Absolute: 0 10*3/uL (ref 0.0–0.5)
HEMATOCRIT: 44.2 % (ref 34.8–46.6)
HGB: 14.9 g/dL (ref 11.6–15.9)
LYMPH%: 24.7 % (ref 14.0–49.7)
MCH: 32.2 pg (ref 25.1–34.0)
MCHC: 33.8 g/dL (ref 31.5–36.0)
MCV: 95.3 fL (ref 79.5–101.0)
MONO#: 0.5 10*3/uL (ref 0.1–0.9)
MONO%: 8.7 % (ref 0.0–14.0)
NEUT#: 4 10*3/uL (ref 1.5–6.5)
NEUT%: 65.2 % (ref 38.4–76.8)
PLATELETS: 197 10*3/uL (ref 145–400)
RBC: 4.64 10*6/uL (ref 3.70–5.45)
RDW: 12 % (ref 11.2–14.5)
WBC: 6.1 10*3/uL (ref 3.9–10.3)
lymph#: 1.5 10*3/uL (ref 0.9–3.3)

## 2015-10-13 LAB — BASIC METABOLIC PANEL
ANION GAP: 8 meq/L (ref 3–11)
BUN: 22.2 mg/dL (ref 7.0–26.0)
CALCIUM: 10.1 mg/dL (ref 8.4–10.4)
CO2: 25 mEq/L (ref 22–29)
Chloride: 104 mEq/L (ref 98–109)
Creatinine: 1.6 mg/dL — ABNORMAL HIGH (ref 0.6–1.1)
EGFR: 35 mL/min/{1.73_m2} — ABNORMAL LOW (ref >90)
Glucose: 79 mg/dl (ref 70–140)
Potassium: 4.6 mEq/L (ref 3.5–5.1)
Sodium: 136 mEq/L (ref 136–145)

## 2015-10-13 NOTE — Progress Notes (Signed)
  [place] OFFICE PROGRESS NOTE   Diagnosis:  Multiple myeloma  INTERVAL HISTORY:    Cristina Garcia returns as scheduled. She underwent a TEE and cardiac catheterization for evaluation of mitral regurgitation in March. She was evaluated by Dr. Roxy Manns and he recommends mitral valve repair. She is scheduled for surgery 10/15/2015.  She reports developing a "cold "or allergy symptoms beginning approximately 1 week ago. She is taking Flonase spray and Sudafed with some improvement. No fever or dyspnea. No pain. No other recent infection.    Objective:  Vital signs in last 24 hours:  Blood pressure 126/66, pulse 78, temperature 97.9 F (36.6 C), temperature source Oral, resp. rate 18, height _0  (1.753 m), weight 128 lb 11.2 oz (58.378 kg), SpO2 100 %.   Resp:  Lungs clear bilaterally Cardio:  Regular rate and rhythm, systolic murmur GI:  No hepatosplenomegaly Vascular:  No leg edema      Lab Results:  Lab Results  Component Value Date   WBC 6.1 10/13/2015   HGB 14.9 10/13/2015   HCT 44.2 10/13/2015   MCV 95.3 10/13/2015   PLT 197 10/13/2015   NEUTROABS 4.0 10/13/2015    potassium 4.6, BUN 22.2, creatinine 1.6, calcium 10.1   04/08/2015:  Kappa free light chains 2.23, no M spike detected  Imaging:  Dg Chest 2 View  10/10/2015  CLINICAL DATA:  Preoperative for mitral valve surgery. EXAM: CHEST  2 VIEW COMPARISON:  07/31/2013 chest radiograph. FINDINGS: Stable cardiomediastinal silhouette with normal heart size and mitral annular calcification. No pneumothorax. No pleural effusion. Lungs appear clear, with no acute consolidative airspace disease and no pulmonary edema. IMPRESSION: No active cardiopulmonary disease. Electronically Signed   By: Ilona Sorrel M.D.   On: 10/10/2015 14:00    Medications: I have reviewed the patient's current medications.  Assessment/Plan: 1. Multiple myeloma, IgA kappa, status post high-dose chemotherapy with autologous stem cell  support at [facility] in June 2008. She remains in clinical remission. 2.  history of Renal insufficiency secondary to multiple myeloma.  3. History of hypokalemia. 4. History of left pelvic pain secondary to a lytic left iliac lesion. A metastatic bone survey was unchanged 07/30/2014 5. Simple cyst at the lower pole of the right kidney on a renal ultrasound, 11/08/2006. 6. Multiseptated cyst of the left adnexa on an ultrasound, 11/08/2006. 7. Osteopenia 8. Mitral valve prolapse/mitral regurgitation-Scheduled for mitral valve repair on 10/15/2015 9. Report of intermittent rectal bleeding. Normal colonoscopy by Dr. Fuller Plan on 08/20/2011.    Disposition:   Cristina Garcia is stable from a hematologic standpoint. We will follow-up on the SPEP, light chains, and IgA level from today. The creatinine is slightly higher today. She has chronic renal insufficiency following treatment for myeloma many years ago. She has seen nephrology in the past. She is maintained on an ACE inhibitor and may benefit from seeing nephrology again.   The creatinine was not significantly changed on labs from 10/10/2015.   She will return for an office visit and metastatic bone survey in 6 months. We will see her sooner pending the myeloma panel from today.   Cristina Coder, MD  10/13/2015  3:43 PM

## 2015-10-13 NOTE — Telephone Encounter (Signed)
Gave pt appt & avs °

## 2015-10-14 ENCOUNTER — Encounter (HOSPITAL_COMMUNITY): Payer: Self-pay | Admitting: Vascular Surgery

## 2015-10-14 ENCOUNTER — Other Ambulatory Visit: Payer: Self-pay | Admitting: *Deleted

## 2015-10-14 DIAGNOSIS — N189 Chronic kidney disease, unspecified: Secondary | ICD-10-CM | POA: Diagnosis present

## 2015-10-14 DIAGNOSIS — I340 Nonrheumatic mitral (valve) insufficiency: Secondary | ICD-10-CM

## 2015-10-14 DIAGNOSIS — I348 Other nonrheumatic mitral valve disorders: Secondary | ICD-10-CM | POA: Diagnosis not present

## 2015-10-14 LAB — BASIC METABOLIC PANEL
BUN: 22 mg/dL (ref 7–25)
CALCIUM: 10.3 mg/dL (ref 8.6–10.4)
CO2: 24 mmol/L (ref 20–31)
CREATININE: 1.32 mg/dL — AB (ref 0.50–1.05)
Chloride: 99 mmol/L (ref 98–110)
Glucose, Bld: 92 mg/dL (ref 65–99)
Potassium: 5.3 mmol/L (ref 3.5–5.3)
SODIUM: 136 mmol/L (ref 135–146)

## 2015-10-14 LAB — KAPPA/LAMBDA LIGHT CHAINS
IG LAMBDA FREE LIGHT CHAIN: 10.7 mg/L (ref 5.71–26.30)
Ig Kappa Free Light Chain: 19.58 mg/L — ABNORMAL HIGH (ref 3.30–19.40)
KAPPA/LAMBDA FLC RATIO: 1.83 — AB (ref 0.26–1.65)

## 2015-10-14 LAB — IGG, IGA, IGM
IGA/IMMUNOGLOBULIN A, SERUM: 106 mg/dL (ref 87–352)
IgG, Qn, Serum: 754 mg/dL (ref 700–1600)
IgM, Qn, Serum: 87 mg/dL (ref 26–217)

## 2015-10-14 MED ORDER — DOPAMINE-DEXTROSE 3.2-5 MG/ML-% IV SOLN
0.0000 ug/kg/min | INTRAVENOUS | Status: DC
  Filled 2015-10-14: qty 250

## 2015-10-14 MED ORDER — NITROGLYCERIN IN D5W 200-5 MCG/ML-% IV SOLN
2.0000 ug/min | INTRAVENOUS | Status: DC
  Filled 2015-10-14: qty 250

## 2015-10-14 MED ORDER — MAGNESIUM SULFATE 50 % IJ SOLN
40.0000 meq | INTRAMUSCULAR | Status: DC
  Filled 2015-10-14: qty 10

## 2015-10-14 MED ORDER — VANCOMYCIN HCL 10 G IV SOLR
1250.0000 mg | INTRAVENOUS | Status: AC
  Administered 2015-10-15: 1250 mg via INTRAVENOUS
  Filled 2015-10-14: qty 1250

## 2015-10-14 MED ORDER — EPINEPHRINE HCL 1 MG/ML IJ SOLN
0.0000 ug/min | INTRAVENOUS | Status: DC
  Filled 2015-10-14: qty 4

## 2015-10-14 MED ORDER — INSULIN REGULAR HUMAN 100 UNIT/ML IJ SOLN
INTRAMUSCULAR | Status: AC
  Administered 2015-10-15: .9 [IU]/h via INTRAVENOUS
  Filled 2015-10-14: qty 2.5

## 2015-10-14 MED ORDER — DEXMEDETOMIDINE HCL IN NACL 400 MCG/100ML IV SOLN
0.1000 ug/kg/h | INTRAVENOUS | Status: AC
  Administered 2015-10-15: .3 ug/kg/h via INTRAVENOUS
  Filled 2015-10-14: qty 100

## 2015-10-14 MED ORDER — DEXTROSE 5 % IV SOLN
1.5000 g | INTRAVENOUS | Status: AC
  Administered 2015-10-15: 1.5 g via INTRAVENOUS
  Administered 2015-10-15: .75 g via INTRAVENOUS
  Filled 2015-10-14: qty 1.5

## 2015-10-14 MED ORDER — DEXTROSE 5 % IV SOLN
30.0000 ug/min | INTRAVENOUS | Status: AC
  Administered 2015-10-15: 20 ug/min via INTRAVENOUS
  Filled 2015-10-14: qty 2

## 2015-10-14 MED ORDER — POTASSIUM CHLORIDE 2 MEQ/ML IV SOLN
80.0000 meq | INTRAVENOUS | Status: DC
  Filled 2015-10-14: qty 40

## 2015-10-14 MED ORDER — CEFUROXIME SODIUM 750 MG IJ SOLR
750.0000 mg | INTRAMUSCULAR | Status: DC
  Filled 2015-10-14: qty 750

## 2015-10-14 MED ORDER — SODIUM CHLORIDE 0.9 % IV SOLN
INTRAVENOUS | Status: AC
  Administered 2015-10-15: 1000 mL
  Filled 2015-10-14: qty 1000

## 2015-10-14 MED ORDER — SODIUM CHLORIDE 0.9 % IV SOLN
INTRAVENOUS | Status: AC
  Administered 2015-10-15: 69.8 mL/h via INTRAVENOUS
  Filled 2015-10-14: qty 40

## 2015-10-14 MED ORDER — PLASMA-LYTE 148 IV SOLN
INTRAVENOUS | Status: AC
  Administered 2015-10-15: 500 mL
  Filled 2015-10-14: qty 2.5

## 2015-10-14 MED ORDER — SODIUM CHLORIDE 0.9 % IV SOLN
INTRAVENOUS | Status: DC
  Filled 2015-10-14: qty 30

## 2015-10-14 NOTE — Progress Notes (Signed)
Anesthesia Chart Review: Patient is a 55 year old female scheduled for minimally invasive MV repair on 10/15/15 by Dr. Roxy Manns.  History includes MVP with severe MR, atrial tachycardia (unsustained on event monitor), non-smoker, multiple myeloma s/p high dose chemotherapy with autologous stem cell support [facility]) '08 (in clinical remission), HTN, breast augmentation, left oophorectomy, osteoporosis. According to Dr. 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 BMI 19.   PCP is Dr. Stevie Kern. HEM-ONC is Dr. [name], last visit 10/13/15. He felt she was stable from a hematologic standpoint. He was concerned that she may need to see nephrology during her hospitalization due to her Cr being up to 1.6. (Dr. Roxy Manns is having her get a STAT BMET today to re-evaluate.) Cardiologist is Dr. [name].  PAT Vitals: BP 111/62, HR 67, RR 20, T 36.7C, O2 sat 100%.  Meds include amiodarone, ASA 81 mg (on hold), Os-cal, lisinopril, Claritin, Toprol XL, MVI, acyclovir PRN.  08/18/15 RHC/LHC: 1. Normal left and right heart filling pressures with prominent v-waves noted on PCWP pressure tracing.  2. No angiographic coronary artery disease.  3. 3+ mitral regurgitation noted.  Will refer for evaluation for minimally invasive mitral valve repair.   08/18/15 TEE: Study Conclusions - Left ventricle: The cavity size was mildly dilated. Wall  thickness was normal. The estimated ejection fraction was 55%.  Wall motion was normal; there were no regional wall motion  abnormalities. - Aortic valve: There was no stenosis. - Aorta: Normal caliber aorta without significant plaque. - Mitral valve: The mitral valve was thickened with bileaflet  prolapse. There were multiple jets (appeared to be 3) of mitral  regurgitation, appears severe. PISA not done with multiple jets.  There was systolic flow reversal in the pulmonary vein doppler  pattern. - Left atrium: The atrium was  mildly dilated. No evidence of  thrombus in the atrial cavity or appendage. - Right ventricle: The cavity size was normal. Systolic function  was normal. - Right atrium: No evidence of thrombus in the atrial cavity or  appendage. - Atrial septum: No defect or patent foramen ovale was identified.  Echo contrast study showed no right-to-left atrial level shunt,  at baseline or with provocation.  Normal exercise treadmill stress test 01/06/15.  12/18/14 - 01/16/15 Cardiac event monitor: 1. Frequent PACs, occasional PVCs.  2. Short runs of atrial tachycardia 3. 1 run of 8 beats wide complex tachycardia  10/10/15 Carotid U/S: Summary: Findings consistent with 1-39 percent stenosis involving the right internal carotid artery and the left internal carotid artery.  Preoperative EKG, CTA chest/abd/pelvis, and CXR noted.   10/10/15 PFTs: FVC 3.64 (87%), FEV1 2.54 (78%), DLCOunc 27.64 (85%).  Labs from 10/10/15 and 10/13/15 noted. Cr 1.60 on 10/13/15 (up from 1.31 on 10/10/15). H/H WNL. PLT 149-197K. PT/PTT WNL. A1c 5.1. As above, patient is getting STAT BMET today at [facility]. If Dr. Roxy Manns feels labs are acceptable for OR then I would anticipate that she could proceed as planned.  George Hugh [facility] Short Stay Center/Anesthesiology Phone [phone] 10/14/2015 10:59 AM

## 2015-10-14 NOTE — H&P (Addendum)
[address] Gardens[address]       [place], [address]             [phone]          CARDIOTHORACIC SURGERY HISTORY AND PHYSICAL EXAM  Referring Provider is Larey Dresser, MD PCP is Joycelyn Man, MD  Chief Complaint  Patient presents with  . Mitral Regurgitation    eval for MVR.Marland KitchenECHO 07/24/15, CATH 08/19/15    HPI:  Patient is a 55 year old female with long-standing history of mitral valve prolapse who has been referred for surgical consultation to discuss treatment options for management of severe symptomatic mitral regurgitation. The patient states that she was first diagnosed with mitral valve prolapse approximately 30 years ago. She denies any family history of mitral valve prolapse or collagen vascular disease, although her mother was reported to have rheumatic fever and underwent mitral valve surgery in the past. The patient has been followed for the last few years by Dr. Aundra Dubin. Transthoracic and transesophageal echocardiogram performed in 2015 revealed bileaflet prolapse with moderate to severe mitral regurgitation. Left ventricular size and function were normal. The patient was asymptomatic at that time. In 2016 the patient began to experience increasing palpitations. She underwent outpatient Holter monitor and was found to have frequent PACs and short runs of atrial tachycardia without any sustained arrhythmias or atrial fibrillation. She was started on Toprol-XL. Over the past 6 months patient has complained of worsening fatigue. She does not experience shortness of breath per se, although she claims that she feels as though she "can't take as deep a breath". She gets tired much more easily than she used to. She has occasional mild lightheadedness without syncope. Symptoms have made it more difficult for her to sleep at night, although she denies any symptoms or orthopnea or PND. She has never had any lower extremity edema. She was seen in follow-up recently  by Dr. Aundra Dubin. Transthoracic echocardiogram revealed severe bileaflet prolapse with severe mitral regurgitation. Left ventricular systolic function was preserved. Transesophageal echocardiogram was performed 08/18/2015. This confirmed the presence of bileaflet prolapse with severe mitral regurgitation. There was systolic flow reversal in the pulmonary veins. Left ventricular size and systolic function remained normal with ejection fraction estimated 55%. Left and right heart catheterization was notable for the absence of coronary artery disease. Pulmonary artery pressures were normal although there were large V waves seen on pulmonary capillary wedge tracing. Patient was referred for surgical consultation.  The patient is married and lives locally in [place] with her husband. She works full-time as a Academic librarian. She remains reasonably active physically although she admits that she does not exercise on a regular basis. She complains of progressive exertional fatigue. She does not spirits significant exertional shortness of breath. She denies any history of exertional chest pain or chest tightness. She denies any history of PND, orthopnea, or lower extremity edema. She has occasional mild dizzy spells without syncope. She has frequent palpitations although the symptoms have improved somewhat on beta blocker therapy.           Past Medical History  Diagnosis Date  . Allergy   . H/O multiple myeloma     clinical remission  . Mitral valve prolapse   . Hypertension   . Osteoporosis   . Constipation   . Mitral regurgitation   . Atrial tachycardia ([place]) 12/18/2014    24 hour event monitor with frequent PAC's, PVC's and short runs of atrial tachycardia -  no sustained arrhythmias or atrial fibrillation   . Anxiety     Past Surgical History  Procedure Laterality Date  . Breast enhancement surgery  2000  . Oophorectomy  1991    left  . Limbal stem cell transplant   2008  . Cesarean section  1992  . Wisdom tooth extraction  1975  . Tee without cardioversion N/A 09/05/2013    Procedure: TRANSESOPHAGEAL ECHOCARDIOGRAM (TEE);  Surgeon: Larey Dresser, MD;  Location: [facility] ENDOSCOPY;  Service: Cardiovascular;  Laterality: N/A;  . Cardiac catheterization N/A 08/18/2015    Procedure: Right/Left Heart Cath and Coronary Angiography;  Surgeon: Larey Dresser, MD;  Location: [facility] LAB;  Service: Cardiovascular;  Laterality: N/A;  . Tee without cardioversion N/A 08/18/2015    Procedure: TRANSESOPHAGEAL ECHOCARDIOGRAM (TEE);  Surgeon: Larey Dresser, MD;  Location[facility] ENDOSCOPY;  Service: Cardiovascular;  Laterality: N/A;    Family History  Problem Relation Age of Onset  . Diabetes      family hx  . Hypertension      family hx  . Lymphoma      family hx  . Colon cancer Neg Hx   . Stomach cancer Neg Hx   . Non-Hodgkin's lymphoma Mother   . Cancer Mother   . Heart disease Mother     CHF  . Hypertension Father     Social History Social History  Substance Use Topics  . Smoking status: Never Smoker   . Smokeless tobacco: Never Used  . Alcohol Use: 2.4 oz/week    4 Glasses of wine per week    Prior to Admission medications   Medication Sig Start Date End Date Taking? Authorizing Provider  acyclovir (ZOVIRAX) 200 MG capsule TAKE 3 CAPSULES BY MOUTH IN THE MORNING & 2 CAPSULES IN THE EVENING UNTIL WELL, THEN 1 CAPSULE DAILY Patient taking differently: 1 CAPSULE (200 mg) DAILY as needed for outbreaks 07/30/14  Yes Dorena Cookey, MD  amiodarone (PACERONE) 200 MG tablet Take 1 tablet (200 mg total) by mouth daily. 10/06/15  Yes Rexene Alberts, MD  aspirin 81 MG tablet Take 81 mg by mouth daily. Reported on 10/08/2015   Yes Historical Provider, MD  calcium carbonate (OS-CAL) 600 MG TABS Take 600 mg by mouth daily with breakfast.    Yes Historical Provider, MD  conjugated estrogens (PREMARIN) vaginal cream Place vaginally 2 (two) times a week. Patient  taking differently: Place 1 Applicatorful vaginally daily as needed (for hormone supplementation).  06/24/14  Yes Dorena Cookey, MD  lisinopril (PRINIVIL,ZESTRIL) 5 MG tablet TAKE 1 TABLET BY MOUTH EVERY MORNING 07/18/15  Yes Dorena Cookey, MD  loratadine (CLARITIN) 10 MG tablet Take 10 mg by mouth daily.   Yes Historical Provider, MD  metoprolol succinate (TOPROL-XL) 25 MG 24 hr tablet TAKE 1 TABLET (25 MG TOTAL) BY MOUTH DAILY. 09/26/15  Yes Larey Dresser, MD  Multiple Vitamins-Minerals (MULTIVITAL PO) Take 1 tablet by mouth daily.     Yes Historical Provider, MD    No Known Allergies    Review of Systems:  General:normal appetite, decreased energy, no weight gain, no weight loss, no fever Cardiac:no chest pain with exertion, no chest pain at rest, no SOB with exertion, no resting SOB, no PND, no orthopnea, = palpitations, NO arrhythmia, NO atrial fibrillation, NO LE edema, + dizzy spells, no syncope Respiratory:no shortness of breath, no home oxygen, no productive cough, no dry cough, no bronchitis, no wheezing, no hemoptysis, no asthma, no  pain with inspiration or cough, no sleep apnea, no CPAP at night GI:no difficulty swallowing, no reflux, no frequent heartburn, no hiatal hernia, no abdominal pain, no constipation, no diarrhea, no hematochezia, no hematemesis, no melena GU:no dysuria, no frequency, no urinary tract infection, no hematuria, no kidney stones, + mild chronic kidney disease Vascular:no pain suggestive of claudication, + pain in feet, + leg cramps, no varicose veins, no DVT, no non-healing foot ulcer Neuro:no stroke, no TIA's, no seizures, no headaches, no temporary blindness one eye, no slurred speech, no peripheral neuropathy,  + chronic pain, mild instability of gait, no memory/cognitive dysfunction Musculoskeletal:no arthritis, + joint swelling, no myalgias, + difficulty walking related to problems with left foot, normal mobility  Skin:no rash, no itching, no skin infections, no pressure sores or ulcerations Psych:no anxiety, no depression, no nervousness, no unusual recent stress Eyes:no blurry vision, no floaters, no recent vision changes, + wears glasses or contacts ENT:no hearing loss, no loose or painful teeth, no dentures, last saw dentist within the past year Hematologic:+ easy bruising, no abnormal bleeding, no clotting disorder, no frequent epistaxis Endocrine:no diabetes, does not check CBG's at home   Physical Exam:  BP 117/74 mmHg  Pulse 64  Resp 16  Ht '5\' 8"'  (1.727 m)  Wt 129 lb (58.514 kg)  BMI 19.62 kg/m2  SpO2 98% General:Thin, well-appearing HEENT:Unremarkable  Neck:no JVD, no bruits, no adenopathy  Chest:clear to auscultation, symmetrical breath sounds, no wheezes, no rhonchi  CV:RRR, grade III/VI holosystolic murmur  Abdomen:soft, non-tender, no masses  Extremities:warm, well-perfused, pulses diminished but palpable, no LE edema Rectal/GUDeferred Neuro:Grossly non-focal and symmetrical throughout Skin:Clean and dry, no rashes, no breakdown   Diagnostic  Tests:  Transthoracic Echocardiography  Patient: Trysta, Showman MR #: [id number] Study Date: 07/24/2015 Gender: F Age: 55 Height: 175.3 cm Weight: 59 kg BSA: 1.69 m^2 Pt. Status: Room:  Elenore Paddy, M.D. REFERRING [name], M.D. SONOGRAPHER Cindy Hazy, RDCS ATTENDING Ena Dawley, M.D. PERFORMING [facility], Outpatient  cc:  ------------------------------------------------------------------- LV EF: 60% - 65%  ------------------------------------------------------------------- Indications: I05.9 Mitral Valve Disorder.  ------------------------------------------------------------------- History: PMH: Acquired from the patient and from the patient&'s chart. PMH: MVP. Atrial Tachycardia. Murmur.  ------------------------------------------------------------------- Study Conclusions  - Left ventricle: The cavity size was normal. Systolic function was  normal. The estimated ejection fraction was in the range of 60%  to 65%. Wall motion was normal; there were no regional wall  motion abnormalities. Features are consistent with a pseudonormal  left ventricular filling pattern, with concomitant abnormal  relaxation and increased filling pressure (grade 2 diastolic  dysfunction). Doppler parameters are consistent with elevated  ventricular end-diastolic filling pressure. - Aortic valve: Trileaflet; mildly thickened leaflets. There was no  regurgitation. - Aortic root: The aortic root was normal in size. - Mitral valve: Mitral valve is myxomatous with severe thickening  and severe bileaflet prolapse. There was severe regurgitation. - Left atrium: The atrium was mildly dilated. - Right ventricle: The cavity size was normal. Wall thickness was  normal. Systolic function was normal. - Tricuspid valve: There was trivial regurgitation. - Pulmonary arteries: Systolic pressure was  within the normal  range. - Inferior vena cava: The vessel was normal in size. - Pericardium, extracardiac: There was no pericardial effusion.  Transthoracic echocardiography. M-mode, complete 2D, spectral Doppler, and color Doppler. Birthdate: Patient birthdate: February 15, 1961. Age: Patient is 55 yr old. Sex: Gender: female. BMI: 19.2 kg/m^2. Patient status: Outpatient. Study date: Study date: 07/24/2015. Study time: 04:13 PM. Location: [facility]  -------------------------------------------------------------------  -------------------------------------------------------------------  Left ventricle: The cavity size was normal. Systolic function was normal. The estimated ejection fraction was in the range of 60% to 65%. Wall motion was normal; there were no regional wall motion abnormalities. Features are consistent with a pseudonormal left ventricular filling pattern, with concomitant abnormal relaxation and increased filling pressure (grade 2 diastolic dysfunction). Doppler parameters are consistent with elevated ventricular end-diastolic filling pressure.  ------------------------------------------------------------------- Aortic valve: Trileaflet; mildly thickened leaflets. Mobility was not restricted. Doppler: Transvalvular velocity was within the normal range. There was no stenosis. There was no regurgitation.  ------------------------------------------------------------------- Aorta: Aortic root: The aortic root was normal in size.  ------------------------------------------------------------------- Mitral valve: Mitral valve is myxomatous with severe thickening and severe bileaflet prolapse. Mobility was not restricted. Doppler: Transvalvular velocity was within the normal range. There was no evidence for stenosis. There was severe regurgitation. Valve area by pressure half-time: 1.95 cm^2. Indexed valve area by pressure half-time: 1.16  cm^2/m^2. Peak gradient (D): 4 mm Hg.  ------------------------------------------------------------------- Left atrium: The atrium was mildly dilated.  ------------------------------------------------------------------- Right ventricle: The cavity size was normal. Wall thickness was normal. Systolic function was normal.  ------------------------------------------------------------------- Pulmonic valve: Structurally normal valve. Cusp separation was normal. Doppler: Transvalvular velocity was within the normal range. There was no evidence for stenosis. There was no regurgitation.  ------------------------------------------------------------------- Tricuspid valve: Structurally normal valve. Doppler: Transvalvular velocity was within the normal range. There was trivial regurgitation.  ------------------------------------------------------------------- Pulmonary artery: The main pulmonary artery was normal-sized. Systolic pressure was within the normal range.  ------------------------------------------------------------------- Right atrium: The atrium was normal in size.  ------------------------------------------------------------------- Pericardium: There was no pericardial effusion.  ------------------------------------------------------------------- Systemic veins: Inferior vena cava: The vessel was normal in size.  ------------------------------------------------------------------- Measurements  Left ventricle Value Reference LV ID, ED, PLAX chordal (H) 52.8 mm 43 - 52 LV ID, ES, PLAX chordal 30.5 mm 23 - 38 LV fx shortening, PLAX chordal 42 % >=29 LV PW thickness, ED 7.61 mm --------- IVS/LV PW ratio, ED 0.84 <=1.3 Stroke volume, 2D 48 ml  --------- Stroke volume/bsa, 2D 28 ml/m^2 --------- LV e&', lateral 7.46 cm/s --------- LV E/e&', lateral 12.71 --------- LV e&', medial 6.2 cm/s --------- LV E/e&', medial 15.29 --------- LV e&', average 6.83 cm/s --------- LV E/e&', average 13.88 ---------  Ventricular septum Value Reference IVS thickness, ED 6.37 mm ---------  LVOT Value Reference LVOT ID, S 20 mm --------- LVOT area 3.14 cm^2 --------- LVOT peak velocity, S 86.5 cm/s --------- LVOT mean velocity, S 52 cm/s --------- LVOT VTI, S 15.3 cm ---------  Aorta Value Reference Aortic root ID, ED 30 mm ---------  Left atrium Value Reference LA ID, A-P, ES 38 mm --------- LA ID/bsa, A-P (H) 2.26 cm/m^2 <=2.2 LA volume, S 77.3 ml --------- LA volume/bsa, S 45.9 ml/m^2 --------- LA volume, ES, 1-p A4C 57.9 ml --------- LA volume/bsa, ES, 1-p A4C 34.4 ml/m^2 --------- LA volume, ES, 1-p A2C 97.4 ml --------- LA volume/bsa, ES, 1-p A2C 57.8 ml/m^2 ---------  Mitral valve Value Reference Mitral E-wave peak velocity 94.8 cm/s --------- Mitral  A-wave peak velocity 63.7 cm/s --------- Mitral deceleration time (H) 243 ms 150 - 230 Mitral pressure half-time 113 ms --------- Mitral peak gradient, D 4 mm Hg --------- Mitral E/A ratio, peak 1.5 --------- Mitral valve area, PHT, DP 1.95 cm^2 --------- Mitral valve area/bsa, PHT, DP 1.16 cm^2/m^2 ---------  Right ventricle Value Reference TAPSE 26.1 mm --------- RV s&', lateral, S 14.2 cm/s ---------  Legend: (L) and (H) mark values outside specified reference range.  ------------------------------------------------------------------- Prepared and Electronically  Authenticated by  Ena Dawley, M.D. 2017-02-09T17:35:14   Transesophageal Echocardiography  Patient: [place], Bearse MR #: [id number] Study Date: 08/18/2015 Gender: F Age: 50 Height: 172.7 cm Weight: 58.6 kg BSA: 1.67 m^2 Pt. Status: Room:  Elenore Paddy, M.D. PERFORMING [name], M.D. SONOGRAPHER Johny Chess, RDCS, CCT ADMITTING Skeet Latch, MD Sidney, MD  cc:  ------------------------------------------------------------------- LV EF: 55%  ------------------------------------------------------------------- Indications: Mitral regurgitation 424.0.  ------------------------------------------------------------------- Study Conclusions  - Left ventricle: The cavity size was mildly dilated. Wall  thickness was normal. The estimated ejection fraction was 55%.  Wall motion was normal; there were no regional wall motion  abnormalities. - Aortic valve: There was no stenosis. - Aorta: Normal caliber aorta without significant  plaque. - Mitral valve: The mitral valve was thickened with bileaflet  prolapse. There were multiple jets (appeared to be 3) of mitral  regurgitation, appears severe. PISA not done with multiple jets.  There was systolic flow reversal in the pulmonary vein doppler  pattern. - Left atrium: The atrium was mildly dilated. No evidence of  thrombus in the atrial cavity or appendage. - Right ventricle: The cavity size was normal. Systolic function  was normal. - Right atrium: No evidence of thrombus in the atrial cavity or  appendage. - Atrial septum: No defect or patent foramen ovale was identified.  Echo contrast study showed no right-to-left atrial level shunt,  at baseline or with provocation.  Diagnostic transesophageal echocardiography. 2D and color Doppler. Birthdate: Patient birthdate: 12-04-1960. Age: Patient is 55 yr old. Sex: Gender: female. BMI: 19.7 kg/m^2. Blood pressure:  129/71 Patient status: Outpatient. Study date: Study date: 08/18/2015. Study time: 08:08 AM. Location: Endoscopy.  -------------------------------------------------------------------  ------------------------------------------------------------------- Left ventricle: The cavity size was mildly dilated. Wall thickness was normal. The estimated ejection fraction was 55%. Wall motion was normal; there were no regional wall motion abnormalities.  ------------------------------------------------------------------- Aortic valve: Trileaflet. Doppler: There was no stenosis. There was no regurgitation.  ------------------------------------------------------------------- Aorta: Normal caliber aorta without significant plaque.  ------------------------------------------------------------------- Mitral valve: The mitral valve was thickened with bileaflet prolapse. There were multiple jets (appeared to be 3) of mitral regurgitation, appears severe. PISA not done with  multiple jets. There was systolic flow reversal in the pulmonary vein doppler pattern.  ------------------------------------------------------------------- Left atrium: The atrium was mildly dilated. No evidence of thrombus in the atrial cavity or appendage.  ------------------------------------------------------------------- Atrial septum: No defect or patent foramen ovale was identified. Echo contrast study showed no right-to-left atrial level shunt, at baseline or with provocation.  ------------------------------------------------------------------- Right ventricle: The cavity size was normal. Systolic function was normal.  ------------------------------------------------------------------- Pulmonic valve: Structurally normal valve. Cusp separation was normal.  ------------------------------------------------------------------- Tricuspid valve: Doppler: There was no significant regurgitation.  ------------------------------------------------------------------- Right atrium: The atrium was normal in size. No evidence of thrombus in the atrial cavity or appendage.  ------------------------------------------------------------------- Pericardium: There was no pericardial effusion.  ------------------------------------------------------------------- Post procedure conclusions Ascending Aorta:  - Normal caliber aorta without significant plaque.  ------------------------------------------------------------------- Prepared and Electronically Authenticated by  [name], M.D. 2017-03-07T16:54:59       CARDIAC CATHETERIZATION Procedures    Right/Left Heart Cath and Coronary Angiography    Conclusion    1. Normal left and right heart filling pressures with prominent v-waves noted on PCWP pressure tracing.  2. No angiographic coronary artery disease.  3. 3+ mitral regurgitation noted.   Will refer for evaluation for minimally  invasive mitral valve repair.     Technique and Indications    Procedure: Right Heart  Cath, Left Heart Cath, Selective Coronary Angiography, LV angiography  Indication: Mitral regurgitation.   Procedural Details: The right brachial and radial areas and right groin were prepped, draped, and anesthetized with 1% lidocaine. I was unable to pass the Swan via brachial access due to small caliber vein. I was able to enter the right radial artery but unable to pass wire, again due to small caliber artery (confirmed by ultrasound). Using the modified Seldinger technique a 5 French sheath was placed in the right femoral artery and a 7 French sheath was placed in the right femoral vein. A Swan-Ganz catheter was used for the right heart catheterization. Standard protocol was followed for recording of right heart pressures and sampling of oxygen saturations. Fick cardiac output was calculated. Standard Judkins catheters were used for selective coronary angiography and left ventriculography. There were no immediate procedural complications. The patient was transferred to the post catheterization recovery area for further monitoring.Estimated blood loss <50 mL.    Coronary Findings    Dominance: Right   Left Main  Short, no coronary disease.     Left Anterior Descending  No angiographic coronary disease.     Left Circumflex  No angiographic coronary disease.     Right Coronary Artery  No angiographic coronary disease.       Right Heart Pressures RHC Procedural Findings: Hemodynamics (mmHg) RA mean 2 RV 22/4 PA 20/6, mean 13 PCWP mean 9 with prominent V-waves LV 108/10 AO 109/54  Oxygen saturations: PA 72% AO 100%  Cardiac Output (Fick) 3.93  Cardiac Index (Fick) 2.31    Wall Motion    EF 55-60%. 3+ mitral regurgitation.           Implants    No implant documentation for this case.    PACS Images    Show images for  Cardiac catheterization     Link to Procedure Log    Procedure Log      Hemo Data       Most Recent Value   Fick Cardiac Output  3.93 L/min   Fick Cardiac Output Index  2.31 (L/min)/BSA   RA A Wave  5 mmHg   RA V Wave  3 mmHg   RA Mean  2 mmHg   RV Systolic Pressure  22 mmHg   RV Diastolic Pressure  0 mmHg   RV EDP  4 mmHg   PA Systolic Pressure  20 mmHg   PA Diastolic Pressure  6 mmHg   PA Mean  13 mmHg   PW A Wave  14 mmHg   PW V Wave  16 mmHg   PW Mean  9 mmHg   AO Systolic Pressure  003 mmHg   AO Diastolic Pressure  54 mmHg   AO Mean  76 mmHg   LV Systolic Pressure  95 mmHg   LV Diastolic Pressure  3 mmHg   LV EDP  10 mmHg   Arterial Occlusion Pressure Extended Systolic Pressure  96 mmHg   Arterial Occlusion Pressure Extended Diastolic Pressure  42 mmHg   Arterial Occlusion Pressure Extended Mean Pressure  64 mmHg   Left Ventricular Apex Extended Systolic Pressure  704 mmHg   Left Ventricular Apex Extended Diastolic Pressure  6 mmHg   Left Ventricular Apex Extended EDP Pressure  11 mmHg   QP/QS  1   TPVR Index  5.63 HRUI   TSVR Index  32.86 HRUI   PVR SVR Ratio  0.05   TPVR/TSVR Ratio  0.17  CT ANGIOGRAPHY CHEST, ABDOMEN AND PELVIS  TECHNIQUE: Multidetector CT imaging through the chest, abdomen and pelvis was performed using the standard protocol during bolus administration of intravenous contrast. Multiplanar reconstructed images and MIPs were obtained and reviewed to evaluate the vascular anatomy.  CONTRAST: 75 mL Isovue 370  COMPARISON: CT 04/14/2006 and bone survey 01/20/2011  FINDINGS: CTA CHEST FINDINGS  Vascular structures: Small amount of calcified plaque at the origin of the left anterior descending coronary artery. Normal caliber of the thoracic aorta without  dissection. The mid ascending thoracic aorta measures 2.5 cm. The great vessels are patent. Compression of the subclavian arteries at the thoracic outlet on both sides probably related to the arm positioning. Typical arch anatomy. Mid descending thoracic aorta measures 2.1 cm. Normal caliber of the pulmonary arteries but there is no significant contrast within the pulmonary arteries. Left ventricle is dilated.  No significant pericardial or pleural fluid. Small amount of fluid in the superior pericardial recess. No chest lymphadenopathy. Thyroid tissue is heterogeneous with a few small nodules. Largest visualized nodule measures 0.7 cm. Patient has bilateral breast implants.  Trachea and mainstem bronchi are patent. Both lungs are clear. No suspicious osseous findings.  Review of the MIP images confirms the above findings.  CTA ABDOMEN AND PELVIS FINDINGS  Vascular structures: Normal caliber of the abdominal aorta with mild atherosclerotic disease. Negative for an aortic dissection. Celiac trunk, SMA and IMA are patent. Bilateral renal arteries are widely patent. There is also an inferior accessory left renal artery. Small amount of calcified plaque involving the common iliac arteries. Overall, iliac arteries are widely patent. Proximal femoral arteries are widely patent. There is a small amount of fluid around the right common femoral artery and related to recent catheterization. No evidence for a pseudoaneurysm or hematoma in the right groin. 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  0.7 cm low-density structure between the left and right hepatic lobes is indeterminate but probably represents a benign etiology such as a cyst. Normal appearance of the gallbladder. Normal appearance of the pancreas without inflammation or duct dilatation. Normal appearance of spleen without enlargement. Normal appearance of the adrenal glands. 4.1 cm low-density  cyst involving the right kidney lower pole. No hydronephrosis. Normal appearance of the urinary bladder. Uterus and adnexal structures are poorly characterized on this arterial phase of contrast but no gross abnormality. No acute abnormality involving the stomach, small bowel or colon. No suspicious lymphadenopathy in the abdomen or pelvis. There is a large lytic lesion or contiguous lesions involving the left ilium. There has been some interval sclerosis and healing of this lesion since the exam in 2007. There appears to be increased lucency along the more superior posterior aspect of the left ilium but findings are most compatible with an old lesion. Disc space narrowing and vacuum disc phenomena at L5-S1.  Review of the MIP images confirms the above findings.  IMPRESSION: Minimal atherosclerotic disease involving the aorta and iliac arteries. No significant stenosis. No evidence for an aortic aneurysm.  Expected changes from recent right groin catheterization. Negative for pseudoaneurysm or hematoma.  Left ventricular dilatation compatible with history of severe mitral regurgitation.  Left sided IVC.  Chronic lytic lesion in the left ilium compatible with history of multiple myeloma. No new suspicious bone lesions.  No acute abnormalities in the chest, abdomen or pelvis.   Electronically Signed  By: Markus Daft M.D.  On: 09/02/2015 09:14         Impression:  Patient has stage D severe symptomatic primary mitral regurgitation. 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 She has recently developed worsening symptoms of fatigue and decreased exercise tolerance, consistent with chronic diastolic congestive heart failure, [place] Heart Association function class I-II. She additionally experiences frequent palpitations and atypical chest discomfort. I have personally reviewed the  patient's recent echocardiograms and diagnostic cardiac catheterization. She has billowing bileaflet prolapse with large redundant mitral valve leaflets and severe mitral regurgitation. Left ventricular size and systolic function remains normal. Based upon review of the patient's recent transesophageal echocardiogram I feel there remains a very high likelihood that her valve should be repairable with low associated operative risks. The patient appears to be a relatively good candidate for minimally invasive approach for surgery. 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   Plan:  The patient was again counseled at length regarding the indications, risks, and potential benefits of mitral valve repair. The patient understands and accepts all potential risks of surgery including but not limited to risk of death, stroke or other neurologic complication, myocardial infarction, congestive heart failure, respiratory failure, renal failure, bleeding requiring transfusion and/or reexploration, arrhythmia, infection or other wound complications, pneumonia, pleural and/or pericardial effusion, pulmonary embolus, aortic dissection or other major vascular complication, or delayed complications related to valve repair or replacement including but not limited to structural valve deterioration and failure, thrombosis, embolization, endocarditis, or paravalvular leak. Alternative surgical approaches have been discussed including a comparison between conventional sternotomy and minimally-invasive techniques. The relative risks and benefits of each have been reviewed as they pertain to the patient's specific circumstances, and all of their questions have been addressed. Specific risks potentially related to the minimally-invasive approach were discussed at length, including but not limited to risk of conversion to full or partial sternotomy, aortic dissection or other major vascular  complication, unilateral acute lung injury or pulmonary edema, phrenic nerve dysfunction or paralysis, rib fracture, chronic pain, lung hernia, or lymphocele. All of her questions have been answered. She has been given a prescription for amiodarone to begin tomorrow to decrease her risk of atrial dysrhythmias following surgery. She has been instructed to stop taking aspirin in anticipation of surgery. On the morning of surgery she will take only Toprol-XL with a sip of water.    Valentina Gu. Roxy Manns, MD 10/06/2015 10:06 AM   Reviewed pre-op labs with patient.  Serum creatinine 1.6 yesterday, repeat today 1.3 which is at the patient's baseline 1.2-1.3.  She reports no significant symptoms and the nasal congestion she complained of last week has virtually completely resolved.  We plan to proceed with surgery tomorrow as originally planned.   Rexene Alberts, MD 10/14/2015 3:41 PM

## 2015-10-15 ENCOUNTER — Inpatient Hospital Stay (HOSPITAL_COMMUNITY): Payer: BLUE CROSS/BLUE SHIELD | Admitting: Vascular Surgery

## 2015-10-15 ENCOUNTER — Inpatient Hospital Stay (HOSPITAL_COMMUNITY)
Admission: RE | Admit: 2015-10-15 | Discharge: 2015-10-21 | DRG: 220 | Disposition: A | Discharge status: Home / Self Care | Payer: BLUE CROSS/BLUE SHIELD | Source: Ambulatory Visit | Attending: Thoracic Surgery (Cardiothoracic Vascular Surgery) | Admitting: Thoracic Surgery (Cardiothoracic Vascular Surgery)

## 2015-10-15 ENCOUNTER — Inpatient Hospital Stay (HOSPITAL_COMMUNITY): Payer: BLUE CROSS/BLUE SHIELD

## 2015-10-15 ENCOUNTER — Encounter (HOSPITAL_COMMUNITY)
Admission: RE | Disposition: A | Discharge status: Home / Self Care | Payer: Self-pay | Source: Ambulatory Visit | Attending: Thoracic Surgery (Cardiothoracic Vascular Surgery)

## 2015-10-15 ENCOUNTER — Encounter (HOSPITAL_COMMUNITY): Payer: Self-pay | Admitting: Surgery

## 2015-10-15 DIAGNOSIS — M810 Age-related osteoporosis without current pathological fracture: Secondary | ICD-10-CM | POA: Diagnosis not present

## 2015-10-15 DIAGNOSIS — Z9484 Stem cells transplant status: Secondary | ICD-10-CM

## 2015-10-15 DIAGNOSIS — Z79899 Other long term (current) drug therapy: Secondary | ICD-10-CM | POA: Diagnosis not present

## 2015-10-15 DIAGNOSIS — Z7982 Long term (current) use of aspirin: Secondary | ICD-10-CM

## 2015-10-15 DIAGNOSIS — I4719 Other supraventricular tachycardia: Secondary | ICD-10-CM | POA: Diagnosis present

## 2015-10-15 DIAGNOSIS — N183 Chronic kidney disease, stage 3 (moderate): Secondary | ICD-10-CM | POA: Diagnosis present

## 2015-10-15 DIAGNOSIS — C9001 Multiple myeloma in remission: Secondary | ICD-10-CM | POA: Diagnosis not present

## 2015-10-15 DIAGNOSIS — I471 Supraventricular tachycardia: Secondary | ICD-10-CM | POA: Diagnosis present

## 2015-10-15 DIAGNOSIS — J9811 Atelectasis: Secondary | ICD-10-CM | POA: Diagnosis not present

## 2015-10-15 DIAGNOSIS — J939 Pneumothorax, unspecified: Secondary | ICD-10-CM

## 2015-10-15 DIAGNOSIS — D62 Acute posthemorrhagic anemia: Secondary | ICD-10-CM | POA: Diagnosis not present

## 2015-10-15 DIAGNOSIS — I10 Essential (primary) hypertension: Secondary | ICD-10-CM | POA: Diagnosis present

## 2015-10-15 DIAGNOSIS — Z7901 Long term (current) use of anticoagulants: Secondary | ICD-10-CM | POA: Diagnosis not present

## 2015-10-15 DIAGNOSIS — Z9689 Presence of other specified functional implants: Secondary | ICD-10-CM

## 2015-10-15 DIAGNOSIS — I959 Hypotension, unspecified: Secondary | ICD-10-CM | POA: Diagnosis not present

## 2015-10-15 DIAGNOSIS — Z952 Presence of prosthetic heart valve: Secondary | ICD-10-CM | POA: Diagnosis not present

## 2015-10-15 DIAGNOSIS — I340 Nonrheumatic mitral (valve) insufficiency: Secondary | ICD-10-CM | POA: Diagnosis present

## 2015-10-15 DIAGNOSIS — J90 Pleural effusion, not elsewhere classified: Secondary | ICD-10-CM | POA: Diagnosis not present

## 2015-10-15 DIAGNOSIS — E8770 Fluid overload, unspecified: Secondary | ICD-10-CM | POA: Diagnosis not present

## 2015-10-15 DIAGNOSIS — R5383 Other fatigue: Secondary | ICD-10-CM | POA: Diagnosis not present

## 2015-10-15 DIAGNOSIS — D6959 Other secondary thrombocytopenia: Secondary | ICD-10-CM | POA: Diagnosis not present

## 2015-10-15 DIAGNOSIS — I341 Nonrheumatic mitral (valve) prolapse: Secondary | ICD-10-CM | POA: Diagnosis present

## 2015-10-15 DIAGNOSIS — I129 Hypertensive chronic kidney disease with stage 1 through stage 4 chronic kidney disease, or unspecified chronic kidney disease: Secondary | ICD-10-CM | POA: Diagnosis not present

## 2015-10-15 DIAGNOSIS — N189 Chronic kidney disease, unspecified: Secondary | ICD-10-CM | POA: Diagnosis present

## 2015-10-15 DIAGNOSIS — Z4682 Encounter for fitting and adjustment of non-vascular catheter: Secondary | ICD-10-CM | POA: Diagnosis not present

## 2015-10-15 DIAGNOSIS — Z9889 Other specified postprocedural states: Secondary | ICD-10-CM

## 2015-10-15 HISTORY — PX: MITRAL VALVE REPAIR: SHX2039

## 2015-10-15 HISTORY — DX: Other specified postprocedural states: Z98.890

## 2015-10-15 HISTORY — PX: TEE WITHOUT CARDIOVERSION: SHX5443

## 2015-10-15 HISTORY — DX: Chronic kidney disease, unspecified: N18.9

## 2015-10-15 LAB — POCT I-STAT, CHEM 8
BUN: 22 mg/dL — AB (ref 6–20)
BUN: 20 mg/dL (ref 6–20)
BUN: 17 mg/dL (ref 6–20)
BUN: 17 mg/dL (ref 6–20)
BUN: 16 mg/dL (ref 6–20)
BUN: 15 mg/dL (ref 6–20)
BUN: 15 mg/dL (ref 6–20)
CALCIUM ION: 1 mmol/L — AB (ref 1.12–1.23)
CALCIUM ION: 1.05 mmol/L — AB (ref 1.12–1.23)
CALCIUM ION: 1.03 mmol/L — AB (ref 1.12–1.23)
CHLORIDE: 103 mmol/L (ref 101–111)
CHLORIDE: 101 mmol/L (ref 101–111)
CREATININE: 0.7 mg/dL (ref 0.44–1.00)
Calcium, Ion: 1.27 mmol/L — ABNORMAL HIGH (ref 1.12–1.23)
Calcium, Ion: 1.25 mmol/L — ABNORMAL HIGH (ref 1.12–1.23)
Calcium, Ion: 1.06 mmol/L — ABNORMAL LOW (ref 1.12–1.23)
Calcium, Ion: 1.06 mmol/L — ABNORMAL LOW (ref 1.12–1.23)
Chloride: 104 mmol/L (ref 101–111)
Chloride: 102 mmol/L (ref 101–111)
Chloride: 105 mmol/L (ref 101–111)
Chloride: 107 mmol/L (ref 101–111)
Chloride: 107 mmol/L (ref 101–111)
Creatinine, Ser: 1 mg/dL (ref 0.44–1.00)
Creatinine, Ser: 0.9 mg/dL (ref 0.44–1.00)
Creatinine, Ser: 0.7 mg/dL (ref 0.44–1.00)
Creatinine, Ser: 0.8 mg/dL (ref 0.44–1.00)
Creatinine, Ser: 0.8 mg/dL (ref 0.44–1.00)
Creatinine, Ser: 0.7 mg/dL (ref 0.44–1.00)
GLUCOSE: 85 mg/dL (ref 65–99)
GLUCOSE: 134 mg/dL — AB (ref 65–99)
Glucose, Bld: 106 mg/dL — ABNORMAL HIGH (ref 65–99)
Glucose, Bld: 96 mg/dL (ref 65–99)
Glucose, Bld: 133 mg/dL — ABNORMAL HIGH (ref 65–99)
Glucose, Bld: 137 mg/dL — ABNORMAL HIGH (ref 65–99)
Glucose, Bld: 137 mg/dL — ABNORMAL HIGH (ref 65–99)
HCT: 35 % — ABNORMAL LOW (ref 36.0–46.0)
HCT: 27 % — ABNORMAL LOW (ref 36.0–46.0)
HCT: 26 % — ABNORMAL LOW (ref 36.0–46.0)
HEMATOCRIT: 37 % (ref 36.0–46.0)
HEMATOCRIT: 25 % — AB (ref 36.0–46.0)
HEMATOCRIT: 22 % — AB (ref 36.0–46.0)
HEMATOCRIT: 27 % — AB (ref 36.0–46.0)
HEMOGLOBIN: 9.2 g/dL — AB (ref 12.0–15.0)
HEMOGLOBIN: 8.8 g/dL — AB (ref 12.0–15.0)
HEMOGLOBIN: 8.5 g/dL — AB (ref 12.0–15.0)
Hemoglobin: 12.6 g/dL (ref 12.0–15.0)
Hemoglobin: 11.9 g/dL — ABNORMAL LOW (ref 12.0–15.0)
Hemoglobin: 7.5 g/dL — ABNORMAL LOW (ref 12.0–15.0)
Hemoglobin: 9.2 g/dL — ABNORMAL LOW (ref 12.0–15.0)
POTASSIUM: 4 mmol/L (ref 3.5–5.1)
POTASSIUM: 3.9 mmol/L (ref 3.5–5.1)
POTASSIUM: 4.2 mmol/L (ref 3.5–5.1)
Potassium: 3.9 mmol/L (ref 3.5–5.1)
Potassium: 4.4 mmol/L (ref 3.5–5.1)
Potassium: 4.8 mmol/L (ref 3.5–5.1)
Potassium: 4.1 mmol/L (ref 3.5–5.1)
SODIUM: 139 mmol/L (ref 135–145)
SODIUM: 134 mmol/L — AB (ref 135–145)
SODIUM: 136 mmol/L (ref 135–145)
SODIUM: 139 mmol/L (ref 135–145)
Sodium: 138 mmol/L (ref 135–145)
Sodium: 139 mmol/L (ref 135–145)
Sodium: 140 mmol/L (ref 135–145)
TCO2: 27 mmol/L (ref 0–100)
TCO2: 26 mmol/L (ref 0–100)
TCO2: 25 mmol/L (ref 0–100)
TCO2: 24 mmol/L (ref 0–100)
TCO2: 23 mmol/L (ref 0–100)
TCO2: 21 mmol/L (ref 0–100)
TCO2: 21 mmol/L (ref 0–100)

## 2015-10-15 LAB — PROTEIN ELECTROPHORESIS, SERUM
A/G Ratio: 1.6 (ref 0.7–1.7)
ALPHA 1: 0.3 g/dL (ref 0.0–0.4)
ALPHA 2: 0.7 g/dL (ref 0.4–1.0)
Albumin: 4.2 g/dL (ref 2.9–4.4)
BETA: 0.9 g/dL (ref 0.7–1.3)
GLOBULIN, TOTAL: 2.6 g/dL (ref 2.2–3.9)
Gamma Globulin: 0.8 g/dL (ref 0.4–1.8)
Total Protein: 6.8 g/dL (ref 6.0–8.5)

## 2015-10-15 LAB — POCT I-STAT 3, ART BLOOD GAS (G3+)
ACID-BASE DEFICIT: 7 mmol/L — AB (ref 0.0–2.0)
Acid-Base Excess: 3 mmol/L — ABNORMAL HIGH (ref 0.0–2.0)
Bicarbonate: 27 mEq/L — ABNORMAL HIGH (ref 20.0–24.0)
Bicarbonate: 19.3 mEq/L — ABNORMAL LOW (ref 20.0–24.0)
O2 SAT: 99 %
O2 Saturation: 100 %
PCO2 ART: 37.1 mmHg (ref 35.0–45.0)
PH ART: 7.448 (ref 7.350–7.450)
PH ART: 7.317 — AB (ref 7.350–7.450)
Patient temperature: 35.9
TCO2: 28 mmol/L (ref 0–100)
TCO2: 20 mmol/L (ref 0–100)
pCO2 arterial: 39 mmHg (ref 35.0–45.0)
pO2, Arterial: 382 mmHg — ABNORMAL HIGH (ref 80.0–100.0)
pO2, Arterial: 155 mmHg — ABNORMAL HIGH (ref 80.0–100.0)

## 2015-10-15 LAB — PROTIME-INR
INR: 1.49 (ref 0.00–1.49)
Prothrombin Time: 18.1 seconds — ABNORMAL HIGH (ref 11.6–15.2)

## 2015-10-15 LAB — PLATELET COUNT: Platelets: 107 K/uL — ABNORMAL LOW (ref 150–400)

## 2015-10-15 LAB — POCT I-STAT 4, (NA,K, GLUC, HGB,HCT)
GLUCOSE: 129 mg/dL — AB (ref 65–99)
HCT: 30 % — ABNORMAL LOW (ref 36.0–46.0)
Hemoglobin: 10.2 g/dL — ABNORMAL LOW (ref 12.0–15.0)
POTASSIUM: 3.9 mmol/L (ref 3.5–5.1)
Sodium: 137 mmol/L (ref 135–145)

## 2015-10-15 LAB — CREATININE, SERUM
CREATININE: 1.14 mg/dL — AB (ref 0.44–1.00)
GFR calc Af Amer: >60 mL/min (ref >60)
GFR, EST NON AFRICAN AMERICAN: 53 mL/min — AB (ref >60)

## 2015-10-15 LAB — CBC
HEMATOCRIT: 32.3 % — AB (ref 36.0–46.0)
HEMOGLOBIN: 10.8 g/dL — AB (ref 12.0–15.0)
MCH: 31.7 pg (ref 26.0–34.0)
MCHC: 33.4 g/dL (ref 30.0–36.0)
MCV: 94.7 fL (ref 78.0–100.0)
Platelets: 128 10*3/uL — ABNORMAL LOW (ref 150–400)
RBC: 3.41 MIL/uL — ABNORMAL LOW (ref 3.87–5.11)
RDW: 12.5 % (ref 11.5–15.5)
WBC: 14.3 10*3/uL — AB (ref 4.0–10.5)

## 2015-10-15 LAB — HEMOGLOBIN AND HEMATOCRIT, BLOOD
HCT: 23.4 % — ABNORMAL LOW (ref 36.0–46.0)
Hemoglobin: 7.9 g/dL — ABNORMAL LOW (ref 12.0–15.0)

## 2015-10-15 LAB — APTT: APTT: 40 s — AB (ref 24–37)

## 2015-10-15 LAB — MAGNESIUM: MAGNESIUM: 3.3 mg/dL — AB (ref 1.7–2.4)

## 2015-10-15 SURGERY — REPAIR, MITRAL VALVE, MINIMALLY INVASIVE
Anesthesia: General | Site: Chest | Laterality: Right

## 2015-10-15 MED ORDER — METOPROLOL TARTRATE 25 MG/10 ML ORAL SUSPENSION: 12.5000 mg | Freq: Two times a day (BID) | ORAL | Status: DC

## 2015-10-15 MED ORDER — SODIUM CHLORIDE 0.9 % IV SOLN
INTRAVENOUS | Status: DC
  Administered 2015-10-15: 17:00:00 via INTRAVENOUS

## 2015-10-15 MED ORDER — ACETAMINOPHEN 500 MG PO TABS
1000.0000 mg | ORAL_TABLET | Freq: Four times a day (QID) | ORAL | Status: AC
  Administered 2015-10-16 – 2015-10-20 (×18): 1000 mg via ORAL
  Filled 2015-10-15 (×19): qty 2

## 2015-10-15 MED ORDER — MIDAZOLAM HCL 5 MG/5ML IJ SOLN
INTRAMUSCULAR | Status: DC | PRN
  Administered 2015-10-15 (×4): 2 mg via INTRAVENOUS

## 2015-10-15 MED ORDER — FENTANYL CITRATE (PF) 250 MCG/5ML IJ SOLN
INTRAMUSCULAR | Status: AC
Start: 2015-10-15 — End: 2015-10-15
  Filled 2015-10-15: qty 30

## 2015-10-15 MED ORDER — ACETAMINOPHEN 650 MG RE SUPP
650.0000 mg | Freq: Once | RECTAL | Status: AC
  Administered 2015-10-15: 650 mg via RECTAL

## 2015-10-15 MED ORDER — ROCURONIUM BROMIDE 50 MG/5ML IV SOLN
INTRAVENOUS | Status: AC
  Filled 2015-10-15: qty 2

## 2015-10-15 MED ORDER — ALBUMIN HUMAN 5 % IV SOLN
250.0000 mL | INTRAVENOUS | Status: AC | PRN
  Administered 2015-10-15 – 2015-10-16 (×4): 250 mL via INTRAVENOUS
  Filled 2015-10-15: qty 250

## 2015-10-15 MED ORDER — ANTISEPTIC ORAL RINSE SOLUTION (CORINZ)
7.0000 mL | Freq: Four times a day (QID) | OROMUCOSAL | Status: DC
  Administered 2015-10-16 – 2015-10-18 (×6): 7 mL via OROMUCOSAL

## 2015-10-15 MED ORDER — MORPHINE SULFATE (PF) 2 MG/ML IV SOLN
2.0000 mg | INTRAVENOUS | Status: DC | PRN
  Administered 2015-10-15 – 2015-10-16 (×6): 2 mg via INTRAVENOUS
  Filled 2015-10-15: qty 2
  Filled 2015-10-15 (×4): qty 1

## 2015-10-15 MED ORDER — BISACODYL 5 MG PO TBEC
10.0000 mg | DELAYED_RELEASE_TABLET | Freq: Every day | ORAL | Status: DC
  Administered 2015-10-16 – 2015-10-19 (×3): 10 mg via ORAL
  Filled 2015-10-15 (×5): qty 2

## 2015-10-15 MED ORDER — BISACODYL 10 MG RE SUPP: 10.0000 mg | Freq: Every day | RECTAL | Status: DC

## 2015-10-15 MED ORDER — DEXMEDETOMIDINE HCL IN NACL 200 MCG/50ML IV SOLN
0.0000 ug/kg/h | INTRAVENOUS | Status: DC
  Administered 2015-10-15: 0.1 ug/kg/h via INTRAVENOUS
  Filled 2015-10-15: qty 50

## 2015-10-15 MED ORDER — DEXTROSE 5 % IV SOLN
1.5000 g | Freq: Two times a day (BID) | INTRAVENOUS | Status: AC
  Administered 2015-10-15 – 2015-10-17 (×4): 1.5 g via INTRAVENOUS
  Filled 2015-10-15 (×4): qty 1.5

## 2015-10-15 MED ORDER — LACTATED RINGERS IV SOLN
INTRAVENOUS | Status: DC
  Administered 2015-10-16: 20 mL via INTRAVENOUS

## 2015-10-15 MED ORDER — LACTATED RINGERS IV SOLN
500.0000 mL | Freq: Once | INTRAVENOUS | Status: AC | PRN
  Administered 2015-10-16: 500 mL via INTRAVENOUS

## 2015-10-15 MED ORDER — ARTIFICIAL TEARS OP OINT
TOPICAL_OINTMENT | OPHTHALMIC | Status: DC | PRN
  Administered 2015-10-15: 1 via OPHTHALMIC

## 2015-10-15 MED ORDER — LACTATED RINGERS IV SOLN: INTRAVENOUS | Status: DC

## 2015-10-15 MED ORDER — PROPOFOL 10 MG/ML IV BOLUS
INTRAVENOUS | Status: AC
Start: 2015-10-15 — End: 2015-10-15
  Filled 2015-10-15: qty 20

## 2015-10-15 MED ORDER — CHLORHEXIDINE GLUCONATE 0.12% ORAL RINSE (MEDLINE KIT)
15.0000 mL | Freq: Two times a day (BID) | OROMUCOSAL | Status: DC
  Administered 2015-10-15 – 2015-10-16 (×2): 15 mL via OROMUCOSAL

## 2015-10-15 MED ORDER — PANTOPRAZOLE SODIUM 40 MG PO TBEC
40.0000 mg | DELAYED_RELEASE_TABLET | Freq: Every day | ORAL | Status: DC
  Administered 2015-10-17 – 2015-10-21 (×5): 40 mg via ORAL
  Filled 2015-10-15 (×5): qty 1

## 2015-10-15 MED ORDER — INSULIN REGULAR BOLUS VIA INFUSION
0.0000 [IU] | Freq: Three times a day (TID) | INTRAVENOUS | Status: DC
  Filled 2015-10-15: qty 10

## 2015-10-15 MED ORDER — BUPIVACAINE 0.5 % ON-Q PUMP SINGLE CATH 400 ML
INJECTION | Status: DC | PRN
  Administered 2015-10-15: 400 mL

## 2015-10-15 MED ORDER — CHLORHEXIDINE GLUCONATE 0.12 % MT SOLN: 15.0000 mL | Freq: Once | OROMUCOSAL | Status: DC

## 2015-10-15 MED ORDER — METOPROLOL TARTRATE 5 MG/5ML IV SOLN: 2.5000 mg | INTRAVENOUS | Status: DC | PRN

## 2015-10-15 MED ORDER — BUPIVACAINE HCL (PF) 0.5 % IJ SOLN
INTRAMUSCULAR | Status: DC | PRN
  Administered 2015-10-15: 10 mL

## 2015-10-15 MED ORDER — INSULIN ASPART 100 UNIT/ML ~~LOC~~ SOLN
0.0000 [IU] | SUBCUTANEOUS | Status: DC
  Administered 2015-10-16 (×3): 2 [IU] via SUBCUTANEOUS

## 2015-10-15 MED ORDER — EPHEDRINE SULFATE 50 MG/ML IJ SOLN
INTRAMUSCULAR | Status: DC | PRN
  Administered 2015-10-15: 10 mg via INTRAVENOUS

## 2015-10-15 MED ORDER — 0.9 % SODIUM CHLORIDE (POUR BTL) OPTIME
TOPICAL | Status: DC | PRN
  Administered 2015-10-15: 1000 mL

## 2015-10-15 MED ORDER — SODIUM CHLORIDE 0.9 % IV SOLN: 250.0000 mL | INTRAVENOUS | Status: DC

## 2015-10-15 MED ORDER — MIDAZOLAM HCL 10 MG/2ML IJ SOLN
INTRAMUSCULAR | Status: AC
Start: 2015-10-15 — End: 2015-10-15
  Filled 2015-10-15: qty 2

## 2015-10-15 MED ORDER — POTASSIUM CHLORIDE 10 MEQ/50ML IV SOLN
10.0000 meq | INTRAVENOUS | Status: AC
  Administered 2015-10-15 (×3): 10 meq via INTRAVENOUS

## 2015-10-15 MED ORDER — SODIUM BICARBONATE 8.4 % IV SOLN
50.0000 meq | Freq: Once | INTRAVENOUS | Status: AC
  Administered 2015-10-15: 50 meq via INTRAVENOUS

## 2015-10-15 MED ORDER — INSULIN REGULAR HUMAN 100 UNIT/ML IJ SOLN
INTRAMUSCULAR | Status: DC
  Administered 2015-10-15: 22:00:00 via INTRAVENOUS
  Filled 2015-10-15: qty 2.5

## 2015-10-15 MED ORDER — DOCUSATE SODIUM 100 MG PO CAPS
200.0000 mg | ORAL_CAPSULE | Freq: Every day | ORAL | Status: DC
  Administered 2015-10-16 – 2015-10-21 (×5): 200 mg via ORAL
  Filled 2015-10-15 (×6): qty 2

## 2015-10-15 MED ORDER — OXYCODONE HCL 5 MG PO TABS
5.0000 mg | ORAL_TABLET | ORAL | Status: DC | PRN
  Administered 2015-10-16 – 2015-10-17 (×3): 5 mg via ORAL
  Administered 2015-10-19 – 2015-10-20 (×2): 10 mg via ORAL
  Filled 2015-10-15: qty 2
  Filled 2015-10-15: qty 1
  Filled 2015-10-15: qty 2
  Filled 2015-10-15 (×2): qty 1

## 2015-10-15 MED ORDER — ROCURONIUM BROMIDE 50 MG/5ML IV SOLN
INTRAVENOUS | Status: AC
  Filled 2015-10-15: qty 1

## 2015-10-15 MED ORDER — METOPROLOL TARTRATE 12.5 MG HALF TABLET: 12.5000 mg | ORAL_TABLET | Freq: Two times a day (BID) | ORAL | Status: DC

## 2015-10-15 MED ORDER — ASPIRIN 81 MG PO CHEW: 324.0000 mg | CHEWABLE_TABLET | Freq: Every day | ORAL | Status: DC

## 2015-10-15 MED ORDER — LACTATED RINGERS IV SOLN
INTRAVENOUS | Status: DC | PRN
Start: 2015-10-15 — End: 2015-10-15
  Administered 2015-10-15: 08:00:00 via INTRAVENOUS

## 2015-10-15 MED ORDER — ARTIFICIAL TEARS OP OINT
TOPICAL_OINTMENT | OPHTHALMIC | Status: AC
Start: 2015-10-15 — End: 2015-10-15
  Filled 2015-10-15: qty 3.5

## 2015-10-15 MED ORDER — CHLORHEXIDINE GLUCONATE 4 % EX LIQD: 30.0000 mL | CUTANEOUS | Status: DC

## 2015-10-15 MED ORDER — PHENYLEPHRINE 40 MCG/ML (10ML) SYRINGE FOR IV PUSH (FOR BLOOD PRESSURE SUPPORT)
PREFILLED_SYRINGE | INTRAVENOUS | Status: AC
  Filled 2015-10-15: qty 10

## 2015-10-15 MED ORDER — ACETAMINOPHEN 160 MG/5ML PO SOLN: 1000.0000 mg | Freq: Four times a day (QID) | ORAL | Status: DC

## 2015-10-15 MED ORDER — MORPHINE SULFATE (PF) 2 MG/ML IV SOLN: 1.0000 mg | INTRAVENOUS | Status: DC | PRN

## 2015-10-15 MED ORDER — FENTANYL CITRATE (PF) 100 MCG/2ML IJ SOLN
INTRAMUSCULAR | Status: DC | PRN
  Administered 2015-10-15 (×3): 100 ug via INTRAVENOUS
  Administered 2015-10-15 (×2): 250 ug via INTRAVENOUS
  Administered 2015-10-15: 150 ug via INTRAVENOUS

## 2015-10-15 MED ORDER — ALBUMIN HUMAN 5 % IV SOLN
INTRAVENOUS | Status: DC | PRN
  Administered 2015-10-15: 15:00:00 via INTRAVENOUS

## 2015-10-15 MED ORDER — CALCIUM CHLORIDE 10 % IV SOLN
INTRAVENOUS | Status: AC
  Filled 2015-10-15: qty 10

## 2015-10-15 MED ORDER — DEXMEDETOMIDINE HCL IN NACL 200 MCG/50ML IV SOLN
INTRAVENOUS | Status: AC
  Filled 2015-10-15: qty 50

## 2015-10-15 MED ORDER — FAMOTIDINE IN NACL 20-0.9 MG/50ML-% IV SOLN
20.0000 mg | Freq: Two times a day (BID) | INTRAVENOUS | Status: AC
  Administered 2015-10-15 – 2015-10-16 (×2): 20 mg via INTRAVENOUS
  Filled 2015-10-15: qty 50

## 2015-10-15 MED ORDER — SODIUM CHLORIDE 0.45 % IV SOLN
INTRAVENOUS | Status: DC | PRN
  Administered 2015-10-15: 17:00:00 via INTRAVENOUS

## 2015-10-15 MED ORDER — CHLORHEXIDINE GLUCONATE 0.12 % MT SOLN
15.0000 mL | OROMUCOSAL | Status: AC
  Administered 2015-10-15: 15 mL via OROMUCOSAL

## 2015-10-15 MED ORDER — BUPIVACAINE HCL (PF) 0.5 % IJ SOLN
INTRAMUSCULAR | Status: AC
  Filled 2015-10-15: qty 10

## 2015-10-15 MED ORDER — LIDOCAINE 2% (20 MG/ML) 5 ML SYRINGE
INTRAMUSCULAR | Status: AC
Start: 2015-10-15 — End: 2015-10-15
  Filled 2015-10-15: qty 5

## 2015-10-15 MED ORDER — MIDAZOLAM HCL 2 MG/2ML IJ SOLN: 2.0000 mg | INTRAMUSCULAR | Status: DC | PRN

## 2015-10-15 MED ORDER — LACTATED RINGERS IV SOLN
INTRAVENOUS | Status: DC | PRN
Start: 2015-10-15 — End: 2015-10-15
  Administered 2015-10-15 (×2): via INTRAVENOUS

## 2015-10-15 MED ORDER — PROPOFOL 10 MG/ML IV BOLUS
INTRAVENOUS | Status: AC
  Filled 2015-10-15: qty 20

## 2015-10-15 MED ORDER — PROPOFOL 10 MG/ML IV BOLUS
INTRAVENOUS | Status: DC | PRN
  Administered 2015-10-15: 30 mg via INTRAVENOUS
  Administered 2015-10-15: 70 mg via INTRAVENOUS
  Administered 2015-10-15: 30 mg via INTRAVENOUS

## 2015-10-15 MED ORDER — ONDANSETRON HCL 4 MG/2ML IJ SOLN
4.0000 mg | Freq: Four times a day (QID) | INTRAMUSCULAR | Status: DC | PRN
  Administered 2015-10-16 – 2015-10-19 (×3): 4 mg via INTRAVENOUS
  Filled 2015-10-15 (×3): qty 2

## 2015-10-15 MED ORDER — VANCOMYCIN HCL IN DEXTROSE 1-5 GM/200ML-% IV SOLN
1000.0000 mg | Freq: Once | INTRAVENOUS | Status: AC
  Administered 2015-10-15: 1000 mg via INTRAVENOUS
  Filled 2015-10-15: qty 200

## 2015-10-15 MED ORDER — ACETAMINOPHEN 160 MG/5ML PO SOLN: 650.0000 mg | Freq: Once | ORAL | Status: AC

## 2015-10-15 MED ORDER — PHENYLEPHRINE HCL 10 MG/ML IJ SOLN
0.0000 ug/min | INTRAVENOUS | Status: DC
  Administered 2015-10-15: 15 ug/min via INTRAVENOUS
  Administered 2015-10-16: 60 ug/min via INTRAVENOUS
  Administered 2015-10-16: 40 ug/min via INTRAVENOUS
  Administered 2015-10-17: 10 ug/min via INTRAVENOUS
  Filled 2015-10-15 (×4): qty 2

## 2015-10-15 MED ORDER — NITROGLYCERIN IN D5W 200-5 MCG/ML-% IV SOLN: 0.0000 ug/min | INTRAVENOUS | Status: DC

## 2015-10-15 MED ORDER — HEPARIN SODIUM (PORCINE) 1000 UNIT/ML IJ SOLN
INTRAMUSCULAR | Status: DC | PRN
  Administered 2015-10-15: 16000 [IU] via INTRAVENOUS

## 2015-10-15 MED ORDER — TRAMADOL HCL 50 MG PO TABS
50.0000 mg | ORAL_TABLET | ORAL | Status: DC | PRN
  Administered 2015-10-16: 50 mg via ORAL
  Administered 2015-10-20 – 2015-10-21 (×2): 100 mg via ORAL
  Filled 2015-10-15: qty 1
  Filled 2015-10-15 (×3): qty 2

## 2015-10-15 MED ORDER — PROTAMINE SULFATE 10 MG/ML IV SOLN
INTRAVENOUS | Status: DC | PRN
  Administered 2015-10-15: 150 mg via INTRAVENOUS

## 2015-10-15 MED ORDER — EPHEDRINE 5 MG/ML INJ
INTRAVENOUS | Status: AC
  Filled 2015-10-15: qty 10

## 2015-10-15 MED ORDER — SODIUM CHLORIDE 0.9% FLUSH
3.0000 mL | Freq: Two times a day (BID) | INTRAVENOUS | Status: DC
  Administered 2015-10-17 – 2015-10-20 (×5): 3 mL via INTRAVENOUS

## 2015-10-15 MED ORDER — BUPIVACAINE 0.5 % ON-Q PUMP SINGLE CATH 400 ML
400.0000 mL | INJECTION | Status: DC
  Filled 2015-10-15: qty 400

## 2015-10-15 MED ORDER — SODIUM CHLORIDE 0.9 % IV SOLN
INTRAVENOUS | Status: DC
Start: 2015-10-15 — End: 2015-10-16
  Administered 2015-10-15: 17:00:00 via INTRAVENOUS
  Administered 2015-10-16: 10 mL via INTRAVENOUS

## 2015-10-15 MED ORDER — SODIUM CHLORIDE 0.9% FLUSH
3.0000 mL | INTRAVENOUS | Status: DC | PRN
  Administered 2015-10-16: 3 mL via INTRAVENOUS
  Filled 2015-10-15: qty 3

## 2015-10-15 MED ORDER — ROCURONIUM BROMIDE 100 MG/10ML IV SOLN
INTRAVENOUS | Status: DC | PRN
  Administered 2015-10-15: 40 mg via INTRAVENOUS
  Administered 2015-10-15 (×3): 50 mg via INTRAVENOUS
  Administered 2015-10-15: 60 mg via INTRAVENOUS

## 2015-10-15 MED ORDER — MAGNESIUM SULFATE 4 GM/100ML IV SOLN
4.0000 g | Freq: Once | INTRAVENOUS | Status: AC
  Administered 2015-10-15: 4 g via INTRAVENOUS
  Filled 2015-10-15: qty 100

## 2015-10-15 MED ORDER — ASPIRIN EC 325 MG PO TBEC
325.0000 mg | DELAYED_RELEASE_TABLET | Freq: Every day | ORAL | Status: DC
  Administered 2015-10-16: 325 mg via ORAL
  Filled 2015-10-15: qty 1

## 2015-10-15 MED FILL — Potassium Chloride Inj 2 mEq/ML: INTRAVENOUS | Qty: 40 | Status: AC

## 2015-10-15 MED FILL — Heparin Sodium (Porcine) Inj 1000 Unit/ML: INTRAMUSCULAR | Qty: 30 | Status: AC

## 2015-10-15 MED FILL — Magnesium Sulfate Inj 50%: INTRAMUSCULAR | Qty: 10 | Status: AC

## 2015-10-15 SURGICAL SUPPLY — 122 items
ADAPTER CARDIO PERF ANTE/RETRO (ADAPTER) ×3 IMPLANT
ADH SKN CLS APL DERMABOND .7 (GAUZE/BANDAGES/DRESSINGS) ×4
ADPR PRFSN 84XANTGRD RTRGD (ADAPTER) ×2
BAG DECANTER FOR FLEXI CONT (MISCELLANEOUS) ×6 IMPLANT
BLADE SURG 11 STRL SS (BLADE) ×3 IMPLANT
CANISTER SUCTION 2500CC (MISCELLANEOUS) ×6 IMPLANT
CANNULA FEM VENOUS REMOTE 22FR (CANNULA) ×1 IMPLANT
CANNULA FEMORAL ART 14 SM (MISCELLANEOUS) ×3 IMPLANT
CANNULA GUNDRY RCSP 15FR (MISCELLANEOUS) ×3 IMPLANT
CANNULA OPTISITE PERFUSION 16F (CANNULA) IMPLANT
CANNULA OPTISITE PERFUSION 18F (CANNULA) ×1 IMPLANT
CANNULA SUMP PERICARDIAL (CANNULA) ×6 IMPLANT
CATH KIT ON Q 5IN SLV (PAIN MANAGEMENT) IMPLANT
CATH KIT ON-Q SILVERSOAK 5IN (CATHETERS) ×3 IMPLANT
CONN ST 1/4X3/8  BEN (MISCELLANEOUS) ×2
CONN ST 1/4X3/8 BEN (MISCELLANEOUS) ×4 IMPLANT
CONNECTOR 1/2X3/8X1/2 3 WAY (MISCELLANEOUS) ×1
CONNECTOR 1/2X3/8X1/2 3WAY (MISCELLANEOUS) ×2 IMPLANT
CONT SPEC 4OZ CLIKSEAL STRL BL (MISCELLANEOUS) ×2 IMPLANT
CONT SPEC STER OR (MISCELLANEOUS) ×3 IMPLANT
COVER BACK TABLE 24X17X13 BIG (DRAPES) ×3 IMPLANT
COVER MAYO STAND STRL (DRAPES) ×3 IMPLANT
CRADLE DONUT ADULT HEAD (MISCELLANEOUS) ×3 IMPLANT
DERMABOND ADVANCED (GAUZE/BANDAGES/DRESSINGS) ×2
DERMABOND ADVANCED .7 DNX12 (GAUZE/BANDAGES/DRESSINGS) ×4 IMPLANT
DEVICE PMI PUNCTURE CLOSURE (MISCELLANEOUS) ×3 IMPLANT
DEVICE SUT CK QUICK LOAD MINI (Prosthesis & Implant Heart) ×2 IMPLANT
DEVICE TROCAR PUNCTURE CLOSURE (ENDOMECHANICALS) ×3 IMPLANT
DRAIN CHANNEL 28F RND 3/8 FF (WOUND CARE) ×6 IMPLANT
DRAPE BILATERAL SPLIT (DRAPES) ×3 IMPLANT
DRAPE C-ARM 42X72 X-RAY (DRAPES) ×3 IMPLANT
DRAPE CV SPLIT W-CLR ANES SCRN (DRAPES) ×3 IMPLANT
DRAPE INCISE IOBAN 66X45 STRL (DRAPES) ×9 IMPLANT
DRAPE SLUSH/WARMER DISC (DRAPES) ×3 IMPLANT
DRSG COVADERM 4X8 (GAUZE/BANDAGES/DRESSINGS) ×3 IMPLANT
ELECT BLADE 6.5 EXT (BLADE) ×3 IMPLANT
ELECT REM PT RETURN 9FT ADLT (ELECTROSURGICAL) ×6
ELECTRODE REM PT RTRN 9FT ADLT (ELECTROSURGICAL) ×4 IMPLANT
FELT TEFLON 1X6 (MISCELLANEOUS) ×3 IMPLANT
FEMORAL VENOUS CANN RAP (CANNULA) IMPLANT
GLOVE BIOGEL M 6.5 STRL (GLOVE) ×12 IMPLANT
GLOVE BIOGEL M 7.0 STRL (GLOVE) ×12 IMPLANT
GLOVE BIOGEL M 9.0 STRL STRW (GLOVE) ×1 IMPLANT
GLOVE BIOGEL M STRL SZ7.5 (GLOVE) ×2 IMPLANT
GLOVE BIOGEL PI IND STRL 7.0 (GLOVE) IMPLANT
GLOVE BIOGEL PI INDICATOR 7.0 (GLOVE) ×6
GLOVE ORTHO TXT STRL SZ7.5 (GLOVE) ×9 IMPLANT
GOWN STRL REUS W/ TWL LRG LVL3 (GOWN DISPOSABLE) ×8 IMPLANT
GOWN STRL REUS W/TWL LRG LVL3 (GOWN DISPOSABLE) ×21
IV NS IRRIG 3000ML ARTHROMATIC (IV SOLUTION) ×1 IMPLANT
KIT BASIN OR (CUSTOM PROCEDURE TRAY) ×3 IMPLANT
KIT DILATOR VASC 18G NDL (KITS) ×4 IMPLANT
KIT DRAINAGE VACCUM ASSIST (KITS) ×1 IMPLANT
KIT ROOM TURNOVER OR (KITS) ×3 IMPLANT
KIT SUCTION CATH 14FR (SUCTIONS) ×3 IMPLANT
KIT SUT CK MINI COMBO 4X17 (Prosthesis & Implant Heart) ×1 IMPLANT
LEAD PACING MYOCARDI (MISCELLANEOUS) ×3 IMPLANT
LINE VENT (MISCELLANEOUS) ×2 IMPLANT
NDL AORTIC ROOT 14G 7F (CATHETERS) ×1 IMPLANT
NEEDLE AORTIC ROOT 14G 7F (CATHETERS) ×3 IMPLANT
NS IRRIG 1000ML POUR BTL (IV SOLUTION) ×15 IMPLANT
PACK OPEN HEART (CUSTOM PROCEDURE TRAY) ×3 IMPLANT
PAD ARMBOARD 7.5X6 YLW CONV (MISCELLANEOUS) ×6 IMPLANT
PAD ELECT DEFIB RADIOL ZOLL (MISCELLANEOUS) ×3 IMPLANT
PATCH CORMATRIX 4CMX7CM (Prosthesis & Implant Heart) ×2 IMPLANT
RETRACTOR TRL SOFT TISSUE LG (INSTRUMENTS) IMPLANT
RETRACTOR TRM SOFT TISSUE 7.5 (INSTRUMENTS) IMPLANT
RING ANLPLS CARP-EDW PHY II 38 (Prosthesis & Implant Heart) IMPLANT
RING ANNULOPLASTY PHY II (Prosthesis & Implant Heart) ×3 IMPLANT
SET CANNULATION TOURNIQUET (MISCELLANEOUS) ×3 IMPLANT
SET CARDIOPLEGIA MPS 5001102 (MISCELLANEOUS) ×3 IMPLANT
SET IRRIG TUBING LAPAROSCOPIC (IRRIGATION / IRRIGATOR) ×3 IMPLANT
SOLUTION ANTI FOG 6CC (MISCELLANEOUS) ×3 IMPLANT
SPONGE GAUZE 4X4 12PLY STER LF (GAUZE/BANDAGES/DRESSINGS) ×3 IMPLANT
SUT BONE WAX W31G (SUTURE) ×3 IMPLANT
SUT E-PACK MINIMALLY INVASIVE (SUTURE) ×3 IMPLANT
SUT ETHIBOND (SUTURE) ×2 IMPLANT
SUT ETHIBOND 2 0 SH (SUTURE) ×4 IMPLANT
SUT ETHIBOND 2-0 RB-1 WHT (SUTURE) ×4 IMPLANT
SUT ETHIBOND NAB MH 2-0 36IN (SUTURE) ×2 IMPLANT
SUT ETHIBOND X763 2 0 SH 1 (SUTURE) ×3 IMPLANT
SUT GORETEX CV 4 TH 22 36 (SUTURE) ×3 IMPLANT
SUT GORETEX CV4 TH-18 (SUTURE) ×6 IMPLANT
SUT MNCRL AB 3-0 PS2 18 (SUTURE) ×2 IMPLANT
SUT PROLENE 3 0 SH 1 (SUTURE) ×8 IMPLANT
SUT PROLENE 3 0 SH DA (SUTURE) ×2 IMPLANT
SUT PROLENE 3 0 SH1 36 (SUTURE) ×12 IMPLANT
SUT PROLENE 4 0 RB 1 (SUTURE) ×78
SUT PROLENE 4 0 SH DA (SUTURE) ×2 IMPLANT
SUT PROLENE 4-0 RB1 .5 CRCL 36 (SUTURE) IMPLANT
SUT PROLENE 5 0 C 1 36 (SUTURE) ×2 IMPLANT
SUT PROLENE 6 0 C 1 30 (SUTURE) ×12 IMPLANT
SUT PTFE CHORD X 24MM (SUTURE) ×1 IMPLANT
SUT SILK  1 MH (SUTURE) ×10
SUT SILK 1 MH (SUTURE) IMPLANT
SUT SILK 1 TIES 10X30 (SUTURE) ×2 IMPLANT
SUT SILK 2 0 SH CR/8 (SUTURE) ×1 IMPLANT
SUT SILK 2 0 TIES 10X30 (SUTURE) ×3 IMPLANT
SUT SILK 2 0SH CR/8 30 (SUTURE) ×2 IMPLANT
SUT SILK 3 0 (SUTURE) ×3
SUT SILK 3 0 SH CR/8 (SUTURE) ×1 IMPLANT
SUT SILK 3 0SH CR/8 30 (SUTURE) ×2 IMPLANT
SUT SILK 3-0 18XBRD TIE 12 (SUTURE) IMPLANT
SUT TEM PAC WIRE 2 0 SH (SUTURE) ×2 IMPLANT
SUT VIC AB 2-0 CTX 36 (SUTURE) ×6 IMPLANT
SUT VIC AB 2-0 UR6 27 (SUTURE) ×2 IMPLANT
SUT VIC AB 3-0 SH 8-18 (SUTURE) ×3 IMPLANT
SUT VICRYL 2 TP 1 (SUTURE) ×2 IMPLANT
SYRINGE 10CC LL (SYRINGE) ×3 IMPLANT
SYSTEM SAHARA CHEST DRAIN ATS (WOUND CARE) ×3 IMPLANT
TAPE CLOTH SURG 4X10 WHT LF (GAUZE/BANDAGES/DRESSINGS) ×1 IMPLANT
TOWEL OR 17X24 6PK STRL BLUE (TOWEL DISPOSABLE) ×6 IMPLANT
TOWEL OR 17X26 10 PK STRL BLUE (TOWEL DISPOSABLE) ×6 IMPLANT
TRAY FOLEY IC TEMP SENS 16FR (CATHETERS) ×3 IMPLANT
TROCAR XCEL BLADELESS 5X75MML (TROCAR) ×3 IMPLANT
TROCAR XCEL NON-BLD 11X100MML (ENDOMECHANICALS) ×6 IMPLANT
TUBE SUCT INTRACARD DLP 20F (MISCELLANEOUS) ×3 IMPLANT
TUNNELER SHEATH ON-Q 11GX8 DSP (PAIN MANAGEMENT) ×2 IMPLANT
TUNNELER SHEATH ON-Q 16GX12 DP (PAIN MANAGEMENT) ×1 IMPLANT
UNDERPAD 30X30 INCONTINENT (UNDERPADS AND DIAPERS) ×3 IMPLANT
WATER STERILE IRR 1000ML POUR (IV SOLUTION) ×6 IMPLANT
WIRE J 3MM .035X145CM (WIRE) ×3 IMPLANT

## 2015-10-15 NOTE — Progress Notes (Signed)
  Echocardiogram 2D Echocardiogram has been performed.  Jennette Dubin 10/15/2015, 9:00 AM

## 2015-10-15 NOTE — Anesthesia Procedure Notes (Addendum)
Procedure Name: Intubation Date/Time: 10/15/2015 9:01 AM Performed by: Izora Gala Pre-anesthesia Checklist: Patient identified, Emergency Drugs available, Suction available and Patient being monitored Patient Re-evaluated:Patient Re-evaluated prior to inductionOxygen Delivery Method: Circle system utilized Preoxygenation: Pre-oxygenation with 100% oxygen Intubation Type: IV induction Ventilation: Mask ventilation without difficulty Laryngoscope Size: Miller and 3 Endobronchial tube: Left and Double lumen EBT and 37 Fr Number of attempts: 1 Airway Equipment and Method: Stylet Secured at: 31 cm Tube secured with: Tape Dental Injury: Teeth and Oropharynx as per pre-operative assessment     Central Venous Catheter Insertion Performed by: anesthesiologist Patient location: Pre-op. Preanesthetic checklist: patient identified, IV checked, site marked, risks and benefits discussed, surgical consent, monitors and equipment checked, pre-op evaluation, timeout performed and anesthesia consent Position: Trendelenburg Lidocaine 1% used for infiltration Landmarks identified and Seldinger technique used Catheter size: 8.5 Fr Central line was placed.Sheath introducer Procedure performed using ultrasound guided technique. Attempts: 1 Following insertion, line sutured and dressing applied. Post procedure assessment: blood return through all ports, free fluid flow and no air. Patient tolerated the procedure well with no immediate complications.    Central Venous Catheter Insertion Performed by: anesthesiologist Patient location: Pre-op. Preanesthetic checklist: patient identified, IV checked, site marked, risks and benefits discussed, surgical consent, monitors and equipment checked, pre-op evaluation, timeout performed and anesthesia consent Landmarks identified PA cath was placed.Swan type and PA catheter depth:thermodilationProcedure performed using ultrasound guided technique. Attempts:  1 Following insertion, dressing applied. Post procedure assessment: free fluid flow and blood return through all ports. Patient tolerated the procedure well with no immediate complications.

## 2015-10-15 NOTE — Transfer of Care (Signed)
Immediate Anesthesia Transfer of Care Note  Patient: Cristina Garcia  Procedure(s) Performed: Procedure(s): MINIMALLY INVASIVE MITRAL VALVE REPAIR (MVR) (Right) TRANSESOPHAGEAL ECHOCARDIOGRAM (TEE) (N/A)  Patient Location: PACU  Anesthesia Type:General  Level of Consciousness: Patient remains intubated per anesthesia plan  Airway & Oxygen Therapy: Patient remains intubated per anesthesia plan and Patient placed on Ventilator (see vital sign flow sheet for setting)  Post-op Assessment: Report given to RN and Post -op Vital signs reviewed and unstable, Anesthesiologist notified  Post vital signs: Reviewed and stable  Last Vitals:  Filed Vitals:   10/15/15 0718 10/15/15 1639  BP: 100/61 94/73  Pulse: 65 79  Temp: 36.6 C 36.1 C  Resp: 18 10    Last Pain: There were no vitals filed for this visit.       Complications: No apparent anesthesia complications

## 2015-10-15 NOTE — Progress Notes (Signed)
RT NOTE: Cuff Leak + NIF: .30 VC: 947mls

## 2015-10-15 NOTE — Progress Notes (Signed)
RT NOTE:  Rapid Wean Started

## 2015-10-15 NOTE — Anesthesia Preprocedure Evaluation (Addendum)
Anesthesia Evaluation  Patient identified by MRN, date of birth, ID band Patient awake    Reviewed: Allergy & Precautions, NPO status , Patient's Chart, lab work & pertinent test results  History of Anesthesia Complications Negative for: history of anesthetic complications  Airway Mallampati: II  TM Distance: >3 FB Neck ROM: Full    Dental  (+) Teeth Intact   Pulmonary neg pulmonary ROS,    Pulmonary exam normal breath sounds clear to auscultation       Cardiovascular Exercise Tolerance: Poor hypertension, Pt. on home beta blockers and Pt. on medications + Valvular Problems/Murmurs  Rhythm:Irregular Rate:Normal     Neuro/Psych negative neurological ROS  negative psych ROS   GI/Hepatic negative GI ROS, Neg liver ROS,   Endo/Other  negative endocrine ROS  Renal/GU Renal disease     Musculoskeletal   Abdominal (+) + obese,  Abdomen: soft.    Peds  Hematology negative hematology ROS (+)   Anesthesia Other Findings   Reproductive/Obstetrics                            Anesthesia Physical Anesthesia Plan  ASA: III  Anesthesia Plan: General   Post-op Pain Management:    Induction: Intravenous  Airway Management Planned: Double Lumen EBT  Additional Equipment: Arterial line, CVP, PA Cath and 3D TEE  Intra-op Plan:   Post-operative Plan: Post-operative intubation/ventilation  Informed Consent: I have reviewed the patients History and Physical, chart, labs and discussed the procedure including the risks, benefits and alternatives for the proposed anesthesia with the patient or authorized representative who has indicated his/her understanding and acceptance.   Dental advisory given and History available from chart only  Plan Discussed with: CRNA and Surgeon  Anesthesia Plan Comments:        Anesthesia Quick Evaluation

## 2015-10-15 NOTE — Op Note (Addendum)
CARDIOTHORACIC SURGERY OPERATIVE NOTE  Date of Procedure:  10/15/2015  Preoperative Diagnosis: Severe Mitral Regurgitation  Postoperative Diagnosis: Same  Procedure:    Minimally-Invasive Mitral Valve Repair  Complex valvuloplasty including Alfieri edge-to-edge repair  Quadrangular resection (shortening) of entire posterior leaflet  Sliding leaflet plasty  Artificial Gore-tex neochord placement x6  Edwards Physio II Ring Annuloplasty (size 56mm, model # [id number], serial # [id number])   Surgeon: Valentina Gu. 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 The patient states that she was first diagnosed with mitral valve prolapse approximately 30 years ago. She denies any family history of mitral valve prolapse or collagen vascular disease, although her mother was reported to have rheumatic fever and underwent mitral valve surgery in the past. The patient has been followed for the last few years by Dr. Aundra Garcia. Transthoracic and transesophageal echocardiogram performed in 2015 revealed bileaflet prolapse with moderate to severe mitral regurgitation. Left ventricular size and function were normal. The patient was asymptomatic at that time. In 2016 the patient began to experience increasing palpitations. She underwent outpatient Holter monitor and was found to have frequent PACs  and short runs of atrial tachycardia without any sustained arrhythmias or atrial fibrillation. She was started on Toprol-XL. Over the past 6 months patient has complained of worsening fatigue. She does not experience shortness of breath per se, although she claims that she feels as though she "can't take as deep a breath". She gets tired much more easily than she used to. She has occasional mild lightheadedness without syncope. Symptoms have made it more difficult for her to sleep at night, although she denies any symptoms or orthopnea or PND. She has never had any lower extremity edema. She was seen in follow-up recently by Dr. Aundra Garcia. Transthoracic echocardiogram revealed severe bileaflet prolapse with severe mitral regurgitation. Left ventricular systolic function was preserved. Transesophageal echocardiogram was performed 08/18/2015. This confirmed the presence of bileaflet prolapse with severe mitral regurgitation. There was systolic flow reversal in the pulmonary veins. Left ventricular size and systolic function remained normal with ejection fraction estimated 55%. Left and right heart catheterization was notable for the absence of coronary artery disease. Pulmonary artery pressures were normal although there were large V waves seen on pulmonary capillary wedge tracing. Patient was referred for surgical consultation.  The patient has been seen in consultation and counseled at length regarding the indications, risks and potential benefits of surgery.  All questions have been answered, and the patient provides full informed consent for the operation as described.    DETAILS OF THE OPERATIVE PROCEDURE  Preparation:  The patient is brought to the operating room on the above mentioned date and central monitoring was established by the anesthesia team including placement of Swan-Ganz catheter through the left internal jugular vein.  A radial arterial line is placed. The patient is placed in the supine  position on the operating table.  Intravenous antibiotics are administered. General endotracheal anesthesia is induced uneventfully. The patient is initially intubated using a dual lumen endotracheal tube.  A Foley catheter is placed.  Baseline transesophageal echocardiogram was performed.  Findings were notable for classical Barlow's features with severe bileaflet prolapse. The valve was very large with redundant leaflet tissue.  There were no ruptured chordae tendineae. There was moderate-severe mitral regurgitation.  There was LV chamber enlargement with low normal LV systolic function, EF estimated 55%.  No other significant abnormalities were noted.   A soft roll is placed behind the patient's left scapula and the neck gently extended and turned to the left.   The patient's right neck, chest, abdomen, both groins, and both lower extremities are prepared and draped in a sterile manner. A time out procedure is performed.  Surgical Approach:  A right miniature anterolateral thoracotomy incision is performed. The incision is placed just lateral to and superior to the right breast. Care was taken to avoid the patient's preexisting breast implants.  The pectoralis major muscle is retracted medially and completely preserved. The right pleural space is entered through the 3rd intercostal space. A soft tissue retractor is placed.  Two 11 mm ports are placed through separate stab incisions inferiorly. The right pleural space is insufflated continuously with carbon dioxide gas through the posterior port during the remainder of the operation.   A longitudinal incision is made in the pericardium 3 cm anterior to the phrenic nerve and silk traction sutures are placed on either side of the incision for exposure.   Extracorporeal Cardiopulmonary Bypass and Myocardial Protection:  A small incision is made in the right inguinal crease and the anterior surface of the right common femoral artery and right common  femoral vein are identified.  The patient is placed in Trendelenburg position. The right internal jugular vein is cannulated with Seldinger technique and a guidewire advanced into the right atrium. The patient is heparinized systemically. The right internal jugular vein is cannulated with a 14 [place] pediatric femoral venous cannula. Pursestring sutures are placed on the anterior surface of the right common femoral vein and right common femoral artery. The right common femoral vein is cannulated with the Seldinger technique and a guidewire is advanced under transesophageal echocardiogram guidance through the right atrium. The femoral vein is cannulated with a long 22 French femoral venous cannula. The right common femoral artery is cannulated with Seldinger technique and a flexible guidewire is advanced until it can be appreciated intraluminally in the descending thoracic aorta on transesophageal echocardiogram. The femoral artery is cannulated with an 18 French femoral arterial cannula.  Adequate heparinization is verified.     The entire pre-bypass portion of the operation was notable for stable hemodynamics.  Cardiopulmonary bypass was begun.  Vacuum assist venous drainage is utilized. The incision in the pericardium is extended in both directions. Venous drainage and exposure are notably excellent. A retrograde cardioplegia cannula is placed through the right atrium into the coronary sinus using transesophageal echocardiogram guidance.  An antegrade cardioplegia cannula is placed in the ascending aorta.    The patient is cooled to 28C systemic temperature.  The aortic cross clamp is applied and cold blood cardioplegia is delivered initially in an antegrade fashion through the aortic root.   Supplemental cardioplegia is given retrograde through the coronary sinus catheter. The initial cardioplegic arrest is rapid with early diastolic arrest.  Repeat doses of cardioplegia are administered intermittently  every 20 to 30 minutes throughout the entire cross clamp portion  of the operation through the aortic root and through the coronary sinus catheter in order to maintain completely flat electrocardiogram.  Myocardial protection was felt to be excellent.   Mitral Valve Repair:  A left atriotomy incision was performed through the interatrial groove and extended partially across the back wall of the left atrium after opening the oblique sinus inferiorly.  The mitral valve is exposed using a self-retaining retractor.  The mitral valve was inspected and notable for classical Barlow's features.  There was severe prolapse and billowing involving both the anterior and posterior leaflets. There was excessive leaflet tissue, particular involving the posterior leaflet.  Primary chordae tendineae were elongated without rupture.  Interrupted 2-0 Ethibond horizontal mattress sutures are placed circumferentially around the entire mitral valve annulus. The sutures will ultimately be utilized for ring annuloplasty, and at this juncture there are utilized to suspend the valve symmetrically.  Once the annuloplasty sutures were in place the posterior leaflet was carefully examined. There was minimal subannular calcification. There was severe prolapse involving the entire leaflet. The entire leaflet was very large with numerous separate segments that were quite tall and thickened. There were no ruptured chordae tendineae. Because of the extreme amount of excessive tissue involving the posterior leaflet, leaflet resection foreshortening was felt indicated. Several of the individual clefts between segments of the posterior leaflet were initially closed using everting 4-0 Prolene sutures to close the clefts. Subsequently the entire posterior leaflet was mobilized off the posterior annulus with an 11 blade knife. A generous quadrangular resection was performed to shorten each of the individual segments of the posterior leaflet. A  sliding leaflet plasty was performed to reattach the posterior leaflet to the posterior annulus using a 2 layer closure of running 4-0 Prolene suture.  The valve was tested with saline and appeared reasonably competent even without ring annuloplasty complete. The valve was sized to a 38 mm annuloplasty ring, based upon the transverse distance between the left and right commissures and the height of the anterior leaflet, corresponding to a size just slightly larger than the overall surface area of the anterior leaflet.  An Edwards Physio II annuloplasty ring (size 34mm, model #[id number], serial [id number]) was secured in place uneventfully. All ring sutures were secured using a Cor-knot device.  The valve was tested with saline and appeared competent, although there remained some prolapse particularly involving P1 and P2.  A Chord-X multi-strand CV 4 Gore-Tex suture with pre-measured loops was placed.  Prior to implantation the appropriate length of primary cords to the posterior leaflet was measured 24 mm. The pledgeted papillary muscle suture was placed to the head of the anterior papillary muscle and horizontal mattress fashion and tied.  Each of the 3 pairs of loops were then reimplanted into the posterior leaflet including the P1 and P2 segment. At this juncture the valve was again tested with saline and appears improved, although with full distention of the ventricle using sailing there remained some billowing and prolapse of both the anterior and posterior leaflet.  A decision is made to proceed with Alfieri edge to edge for repair. The middle of the anterior leaflet (A2) subsequently sewn to the middle of the posterior leaflet (P2) using a 2 layer closure of interrupted everting 4-0 Prolene suture. Care is taken to foreshorten the middle of the posterior leaflet further to ensure a broad length of leaflet coaptation. Care is also taken to ensure that the closure is in the midline and there remains to  relatively large openings on  either side of the midline closure.  The valve is again tested with saline and appears to be perfectly competent with a broad symmetrical line of coaptation of the anterior and posterior leaflet. The double orifice valve appears intact with no residual leak. Rewarming is begun.   Procedure Completion:  The atriotomy was closed using a 2-layer closure of running 3-0 Prolene suture after placing a sump drain across the mitral valve to serve as a left ventricular vent.  One final dose of warm retrograde "hot shot" cardioplegia was administered retrograde through the coronary sinus catheter while all air was evacuated through the aortic root.  The aortic cross clamp was removed after a total cross clamp time of 172 minutes.  Epicardial pacing wires are fixed to the inferior wall of the right ventricule and to the right atrial appendage. The patient is rewarmed to 37C temperature. The left ventricular vent is removed.  The patient is ventilated and flow volumes turndown while the mitral valve repair is inspected using transesophageal echocardiogram. The valve repair appears intact with no residual leak. The antegrade cardioplegia cannula is now removed. The patient is weaned and disconnected from cardiopulmonary bypass.  The patient's rhythm at separation from bypass was sinus.  The patient was weaned from bypass without any inotropic support. Total cardiopulmonary bypass time for the operation was 228 minutes.  Followup transesophageal echocardiogram performed after separation from bypass revealed a well-seated annuloplasty ring in the mitral position with a normal functioning double orifice mitral valve. There was no residual leak.  Left ventricular function was unchanged from preoperatively.  The femoral arterial and venous cannulae were removed uneventfully. There was a palpable pulse in the distal right common femoral artery after removal of the cannula. Protamine was  administered to reverse the anticoagulation. The right internal jugular cannula was removed and manual pressure held on the neck for 15 minutes.  Single lung ventilation was begun. The atriotomy closure was inspected for hemostasis. The pericardial sac was drained using a 28 French Bard drain placed through the anterior port incision.  The pericardium was closed using a patch of core matrix bovine submucosal tissue patch. The right pleural space is irrigated with saline solution and inspected for hemostasis. The right pleural space was drained using a 28 French Bard drain placed through the posterior port incision. The miniature thoracotomy incision was closed in multiple layers in routine fashion. The right groin incision was inspected for hemostasis and closed in multiple layers in routine fashion.  The post-bypass portion of the operation was notable for stable rhythm and hemodynamics.  No blood products were administered during the operation.   Disposition:  The patient tolerated the procedure well.  The patient was reintubated using a single lumen endotracheal tube and subsequently transported to the surgical intensive care unit in stable condition. There were no intraoperative complications. All sponge instrument and needle counts are verified correct at completion of the operation.     Valentina Gu. Roxy Manns MD 10/15/2015 4:38 PM

## 2015-10-15 NOTE — Brief Op Note (Addendum)
10/15/2015  1:59 PM  PATIENT:  Cristina Garcia  55 y.o. female  PRE-OPERATIVE DIAGNOSIS:  MR  POST-OPERATIVE DIAGNOSIS:  MR  PROCEDURE:  Procedure(s):  MINIMALLY INVASIVE MITRAL VALVE REPAIR (MVR) (Right) -Complex Valvuloplasty including Alfieri edge-to-edge repair  -Placement of 6 Neo Chords -Quadrangular resection for shortening of posterior leaflet -Sliding leaflet plasty  -Ring annuloplasty with a 38 mm Edwards [employer] II Ring  TRANSESOPHAGEAL ECHOCARDIOGRAM (TEE) (N/A)  SURGEON:    Rexene Alberts, MD  ASSISTANTS:  Ellwood Handler, PA-C  ANESTHESIA:   Oleta Mouse, MD  CROSSCLAMP TIME:   72'  CARDIOPULMONARY BYPASS TIME: 228'  FINDINGS:  Classical Barlow'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  MV Stenosis: No mitral valve stenosis.  MV Disease Functional Class: MV Disease Functional Class: Type II.  Etiology (Choose at least one and up to five): Degenerative.  MV Lesions (Choose at least one): Leaflet prolapse, bilateral  Mitral/Tricuspid/Pulmonary Valve Procedure  Mitral Valve Procedure Performed:  Repair: Annuloplasty., Leaflet Resection. Resection Type: Quadrangular. Mitral Leaflet Resection Location: Posterior., Sliding Plasty. and [place]rods. Number of Neochords Inserted: 6, Edge-to-edge Repair  Implant: Annuloplasty Device: Implant model number [id number], Size 38, Unique Device Identifier [id number].   COMPLICATIONS: None  BASELINE WEIGHT: 58 kg  PATIENT DISPOSITION:   TO SICU IN STABLE CONDITION  Rexene Alberts, MD 10/15/2015 4:03 PM

## 2015-10-15 NOTE — Anesthesia Postprocedure Evaluation (Signed)
Anesthesia Post Note  Patient: Cristina Garcia  Procedure(s) Performed: Procedure(s) (LRB): MINIMALLY INVASIVE MITRAL VALVE REPAIR (MVR) (Right) TRANSESOPHAGEAL ECHOCARDIOGRAM (TEE) (N/A)  Patient location during evaluation: ICU Anesthesia Type: General Level of consciousness: sedated Pain management: pain level controlled Vital Signs Assessment: post-procedure vital signs reviewed and stable Respiratory status: patient remains intubated per anesthesia plan Cardiovascular status: stable Postop Assessment: no signs of nausea or vomiting Anesthetic complications: no    Last Vitals:  Filed Vitals:   10/15/15 0718 10/15/15 1639  BP: 100/61 94/73  Pulse: 65 79  Temp: 36.6 C 36.1 C  Resp: 18 10    Last Pain: There were no vitals filed for this visit.               Cristina Garcia

## 2015-10-15 NOTE — Progress Notes (Signed)
RT NOTE:  Rapid Wean Protocol started.

## 2015-10-15 NOTE — Progress Notes (Signed)
Per Dr. Roxy Manns okay to transition patient off of insulin drip and to q4h CBG monitoring. Sherrie Mustache 7:33 PM

## 2015-10-15 NOTE — Progress Notes (Signed)
Spoke with Dr. Servando Snare regarding pre-extubation ABG results. Nif and VC within normal limits, and patient following commands. Order received to give 1 amp of bicarb and extubate. Will continue to closely monitor. Richarda Blade RN

## 2015-10-15 NOTE — Progress Notes (Signed)
Patient ID: Cristina Garcia, female   DOB: Mar 28, 1961, 55 y.o.   MRN: [id number] EVENING ROUNDS NOTE :     [address]       [place] [address]             [phone]                 Day of Surgery Procedure(s) (LRB): MINIMALLY INVASIVE MITRAL VALVE REPAIR (MVR) (Right) TRANSESOPHAGEAL ECHOCARDIOGRAM (TEE) (N/A)  Total Length of Stay:  LOS: 0 days  BP 90/72 mmHg  Pulse 77  Temp(Src) 96.3 F (35.7 C) (Oral)  Resp 14  SpO2 100%  .Intake/Output      05/02 0701 - 05/03 0700 05/03 0701 - 05/04 0700   I.V.  1800   Blood  400   IV Piggyback  500   Total Intake   2700   Urine  1800   Blood  1100   Total Output   2900   Net   -200          . sodium chloride 10 mL/hr at 10/15/15 1645  . sodium chloride 100 mL/hr at 10/15/15 1700  . [START ON 10/16/2015] sodium chloride    . sodium chloride 10 mL/hr at 10/15/15 1645  . dexmedetomidine 0.7 mcg/kg/hr (10/15/15 1645)  . insulin (NOVOLIN-R) infusion Stopped (10/15/15 1645)  . lactated ringers 20 mL/hr at 10/15/15 1645  . lactated ringers    . nitroGLYCERIN Stopped (10/15/15 1630)  . phenylephrine (NEO-SYNEPHRINE) Adult infusion 15 mcg/min (10/15/15 1645)     Lab Results  Component Value Date   WBC 14.3* 10/15/2015   HGB 10.8* 10/15/2015   HCT 32.3* 10/15/2015   PLT 128* 10/15/2015   GLUCOSE 129* 10/15/2015   CHOL 245* 06/18/2014   TRIG 69.0 06/18/2014   HDL 90.40 06/18/2014   LDLDIRECT 104.4 08/13/2010   LDLCALC 141* 06/18/2014   ALT 19 10/10/2015   AST 25 10/10/2015   NA 137 10/15/2015   K 3.9 10/15/2015   CL 107 10/15/2015   CREATININE 0.70 10/15/2015   BUN 15 10/15/2015   CO2 24 10/14/2015   TSH 3.45 06/18/2014   INR 1.03 10/10/2015   HGBA1C 5.1 10/10/2015   Early postop sedated on vent  Not bleeding   Grace Isaac MD  Beeper [phone] Office [phone] 10/15/2015 6:05 PM

## 2015-10-15 NOTE — Interval H&P Note (Signed)
History and Physical Interval Note:  10/15/2015 6:48 AM  Cristina Garcia  has presented today for surgery, with the diagnosis of MR  The various methods of treatment have been discussed with the patient and family. After consideration of risks, benefits and other options for treatment, the patient has consented to  Procedure(s): MINIMALLY INVASIVE MITRAL VALVE REPAIR (MVR) (Right) TRANSESOPHAGEAL ECHOCARDIOGRAM (TEE) (N/A) as a surgical intervention .  The patient's history has been reviewed, patient examined, no change in status, stable for surgery.  I have reviewed the patient's chart and labs.  Questions were answered to the patient's satisfaction.     Rexene Alberts

## 2015-10-15 NOTE — Progress Notes (Signed)
RT NOTE:  Pt switched back to Full Support due to ABG results.

## 2015-10-16 ENCOUNTER — Encounter (HOSPITAL_COMMUNITY): Payer: Self-pay | Admitting: Thoracic Surgery (Cardiothoracic Vascular Surgery)

## 2015-10-16 ENCOUNTER — Inpatient Hospital Stay (HOSPITAL_COMMUNITY): Payer: BLUE CROSS/BLUE SHIELD

## 2015-10-16 ENCOUNTER — Telehealth: Payer: Self-pay | Admitting: *Deleted

## 2015-10-16 LAB — POCT I-STAT 3, ART BLOOD GAS (G3+)
ACID-BASE DEFICIT: 9 mmol/L — AB (ref 0.0–2.0)
ACID-BASE DEFICIT: 4 mmol/L — AB (ref 0.0–2.0)
Acid-base deficit: 6 mmol/L — ABNORMAL HIGH (ref 0.0–2.0)
Acid-base deficit: 7 mmol/L — ABNORMAL HIGH (ref 0.0–2.0)
BICARBONATE: 19.6 meq/L — AB (ref 20.0–24.0)
Bicarbonate: 17.3 mEq/L — ABNORMAL LOW (ref 20.0–24.0)
Bicarbonate: 20 mEq/L (ref 20.0–24.0)
Bicarbonate: 21.9 mEq/L (ref 20.0–24.0)
O2 Saturation: 92 %
O2 Saturation: 96 %
O2 Saturation: 98 %
O2 Saturation: 99 %
PCO2 ART: 41.1 mmHg (ref 35.0–45.0)
PCO2 ART: 41.2 mmHg (ref 35.0–45.0)
PH ART: 7.283 — AB (ref 7.350–7.450)
PH ART: 7.318 — AB (ref 7.350–7.450)
PO2 ART: 124 mmHg — AB (ref 80.0–100.0)
PO2 ART: 132 mmHg — AB (ref 80.0–100.0)
Patient temperature: 36.3
Patient temperature: 36.4
TCO2: 19 mmol/L (ref 0–100)
TCO2: 21 mmol/L (ref 0–100)
TCO2: 21 mmol/L (ref 0–100)
TCO2: 23 mmol/L (ref 0–100)
pCO2 arterial: 39.6 mmHg (ref 35.0–45.0)
pCO2 arterial: 42.3 mmHg (ref 35.0–45.0)
pH, Arterial: 7.249 — ABNORMAL LOW (ref 7.350–7.450)
pH, Arterial: 7.291 — ABNORMAL LOW (ref 7.350–7.450)
pO2, Arterial: 70 mmHg — ABNORMAL LOW (ref 80.0–100.0)
pO2, Arterial: 84 mmHg (ref 80.0–100.0)

## 2015-10-16 LAB — GLUCOSE, CAPILLARY
GLUCOSE-CAPILLARY: 122 mg/dL — AB (ref 65–99)
GLUCOSE-CAPILLARY: 150 mg/dL — AB (ref 65–99)
GLUCOSE-CAPILLARY: 194 mg/dL — AB (ref 65–99)
GLUCOSE-CAPILLARY: 97 mg/dL (ref 65–99)
GLUCOSE-CAPILLARY: 128 mg/dL — AB (ref 65–99)
GLUCOSE-CAPILLARY: 134 mg/dL — AB (ref 65–99)
Glucose-Capillary: 120 mg/dL — ABNORMAL HIGH (ref 65–99)
Glucose-Capillary: 133 mg/dL — ABNORMAL HIGH (ref 65–99)
Glucose-Capillary: 143 mg/dL — ABNORMAL HIGH (ref 65–99)
Glucose-Capillary: 162 mg/dL — ABNORMAL HIGH (ref 65–99)
Glucose-Capillary: 106 mg/dL — ABNORMAL HIGH (ref 65–99)
Glucose-Capillary: 90 mg/dL (ref 65–99)
Glucose-Capillary: 110 mg/dL — ABNORMAL HIGH (ref 65–99)
Glucose-Capillary: 130 mg/dL — ABNORMAL HIGH (ref 65–99)

## 2015-10-16 LAB — POCT I-STAT, CHEM 8
BUN: 13 mg/dL (ref 6–20)
BUN: 15 mg/dL (ref 6–20)
CREATININE: 0.9 mg/dL (ref 0.44–1.00)
Calcium, Ion: 1.16 mmol/L (ref 1.12–1.23)
Calcium, Ion: 1.17 mmol/L (ref 1.12–1.23)
Chloride: 109 mmol/L (ref 101–111)
Chloride: 108 mmol/L (ref 101–111)
Creatinine, Ser: 0.9 mg/dL (ref 0.44–1.00)
Glucose, Bld: 151 mg/dL — ABNORMAL HIGH (ref 65–99)
Glucose, Bld: 155 mg/dL — ABNORMAL HIGH (ref 65–99)
HEMATOCRIT: 25 % — AB (ref 36.0–46.0)
HEMATOCRIT: 29 % — AB (ref 36.0–46.0)
HEMOGLOBIN: 8.5 g/dL — AB (ref 12.0–15.0)
HEMOGLOBIN: 9.9 g/dL — AB (ref 12.0–15.0)
Potassium: 4.4 mmol/L (ref 3.5–5.1)
Potassium: 4.3 mmol/L (ref 3.5–5.1)
SODIUM: 143 mmol/L (ref 135–145)
SODIUM: 139 mmol/L (ref 135–145)
TCO2: 21 mmol/L (ref 0–100)
TCO2: 21 mmol/L (ref 0–100)

## 2015-10-16 LAB — CBC
HCT: 27.6 % — ABNORMAL LOW (ref 36.0–46.0)
HEMATOCRIT: 27.2 % — AB (ref 36.0–46.0)
HEMATOCRIT: 29.5 % — AB (ref 36.0–46.0)
Hemoglobin: 9.2 g/dL — ABNORMAL LOW (ref 12.0–15.0)
Hemoglobin: 9 g/dL — ABNORMAL LOW (ref 12.0–15.0)
Hemoglobin: 9.7 g/dL — ABNORMAL LOW (ref 12.0–15.0)
MCH: 32.6 pg (ref 26.0–34.0)
MCH: 31.7 pg (ref 26.0–34.0)
MCH: 32.8 pg (ref 26.0–34.0)
MCHC: 33.3 g/dL (ref 30.0–36.0)
MCHC: 33.1 g/dL (ref 30.0–36.0)
MCHC: 32.9 g/dL (ref 30.0–36.0)
MCV: 97.9 fL (ref 78.0–100.0)
MCV: 95.8 fL (ref 78.0–100.0)
MCV: 99.7 fL (ref 78.0–100.0)
PLATELETS: 89 10*3/uL — AB (ref 150–400)
Platelets: 120 10*3/uL — ABNORMAL LOW (ref 150–400)
Platelets: 117 10*3/uL — ABNORMAL LOW (ref 150–400)
RBC: 2.82 MIL/uL — ABNORMAL LOW (ref 3.87–5.11)
RBC: 2.84 MIL/uL — ABNORMAL LOW (ref 3.87–5.11)
RBC: 2.96 MIL/uL — ABNORMAL LOW (ref 3.87–5.11)
RDW: 12.5 % (ref 11.5–15.5)
RDW: 12.7 % (ref 11.5–15.5)
RDW: 13.1 % (ref 11.5–15.5)
WBC: 11.6 10*3/uL — AB (ref 4.0–10.5)
WBC: 14.4 10*3/uL — ABNORMAL HIGH (ref 4.0–10.5)
WBC: 17.2 10*3/uL — ABNORMAL HIGH (ref 4.0–10.5)

## 2015-10-16 LAB — CREATININE, SERUM
Creatinine, Ser: 1.1 mg/dL — ABNORMAL HIGH (ref 0.44–1.00)
GFR, EST NON AFRICAN AMERICAN: 55 mL/min — AB (ref >60)

## 2015-10-16 LAB — POCT I-STAT 4, (NA,K, GLUC, HGB,HCT)
GLUCOSE: 132 mg/dL — AB (ref 65–99)
HCT: 30 % — ABNORMAL LOW (ref 36.0–46.0)
Hemoglobin: 10.2 g/dL — ABNORMAL LOW (ref 12.0–15.0)
Potassium: 4 mmol/L (ref 3.5–5.1)
Sodium: 141 mmol/L (ref 135–145)

## 2015-10-16 LAB — BASIC METABOLIC PANEL
Anion gap: 8 (ref 5–15)
BUN: 12 mg/dL (ref 6–20)
CALCIUM: 7.7 mg/dL — AB (ref 8.9–10.3)
CO2: 23 mmol/L (ref 22–32)
CREATININE: 1.1 mg/dL — AB (ref 0.44–1.00)
Chloride: 114 mmol/L — ABNORMAL HIGH (ref 101–111)
GFR calc non Af Amer: 55 mL/min — ABNORMAL LOW (ref >60)
GLUCOSE: 97 mg/dL (ref 65–99)
Potassium: 3.7 mmol/L (ref 3.5–5.1)
Sodium: 145 mmol/L (ref 135–145)

## 2015-10-16 LAB — MAGNESIUM
MAGNESIUM: 2.8 mg/dL — AB (ref 1.7–2.4)
Magnesium: 3.1 mg/dL — ABNORMAL HIGH (ref 1.7–2.4)

## 2015-10-16 MED ORDER — POTASSIUM CHLORIDE 10 MEQ/50ML IV SOLN
10.0000 meq | INTRAVENOUS | Status: AC
  Administered 2015-10-16 (×3): 10 meq via INTRAVENOUS

## 2015-10-16 MED ORDER — WARFARIN - PHYSICIAN DOSING INPATIENT
Freq: Every day | Status: DC
  Administered 2015-10-20: 18:00:00

## 2015-10-16 MED ORDER — MORPHINE SULFATE (PF) 2 MG/ML IV SOLN
1.0000 mg | INTRAVENOUS | Status: DC | PRN
  Administered 2015-10-16 – 2015-10-17 (×3): 2 mg via INTRAVENOUS
  Filled 2015-10-16 (×4): qty 1

## 2015-10-16 MED ORDER — WARFARIN SODIUM 2.5 MG PO TABS
2.5000 mg | ORAL_TABLET | Freq: Every day | ORAL | Status: DC
  Administered 2015-10-16 – 2015-10-18 (×3): 2.5 mg via ORAL
  Filled 2015-10-16 (×3): qty 1

## 2015-10-16 MED FILL — Lidocaine HCl IV Inj 20 MG/ML: INTRAVENOUS | Qty: 5 | Status: AC

## 2015-10-16 MED FILL — Heparin Sodium (Porcine) Inj 1000 Unit/ML: INTRAMUSCULAR | Qty: 10 | Status: AC

## 2015-10-16 MED FILL — Mannitol IV Soln 20%: INTRAVENOUS | Qty: 500 | Status: AC

## 2015-10-16 MED FILL — Electrolyte-R (PH 7.4) Solution: INTRAVENOUS | Qty: 4000 | Status: AC

## 2015-10-16 MED FILL — Sodium Chloride IV Soln 0.9%: INTRAVENOUS | Qty: 2000 | Status: AC

## 2015-10-16 MED FILL — Sodium Bicarbonate IV Soln 8.4%: INTRAVENOUS | Qty: 50 | Status: AC

## 2015-10-16 NOTE — Progress Notes (Signed)
[address]       [place] [address]             [phone]        CARDIOTHORACIC SURGERY PROGRESS NOTE   R1 Day Post-Op Procedure(s) (LRB): MINIMALLY INVASIVE MITRAL VALVE REPAIR (MVR) (Right) TRANSESOPHAGEAL ECHOCARDIOGRAM (TEE) (N/A)  Subjective: Looks good.  Very mild soreness mid chest.  No SOB.  No nausea  Objective: Vital signs: BP Readings from Last 1 Encounters:  10/16/15 91/68   Pulse Readings from Last 1 Encounters:  10/15/15 73   Resp Readings from Last 1 Encounters:  10/16/15 17   Temp Readings from Last 1 Encounters:  10/16/15 97.5 F (36.4 C)     Hemodynamics: PAP: (24-50)/(12-32) 40/21 mmHg CO:  [2.5 L/min-4 L/min] 4 L/min CI:  [1.5 L/min/m2-2.3 L/min/m2] 2.3 L/min/m2  Physical Exam:  Rhythm:   sinus  Breath sounds: clear  Heart sounds:  RRR w/out murmur  Incisions:  Dressings dry, intact  Abdomen:  Soft, non-distended, non-tender  Extremities:  Warm, well-perfused  Chest tubes:  Low volume thin serosanguinous output, no air leak     Intake/Output from previous day: 05/03 0701 - 05/04 0700 In: 6873.6 [I.V.:4263.6; Blood:400; NG/GT:60; IV Piggyback:2150] Out: 4880 [id number]; Blood:1100; Chest Tube:740] Intake/Output this shift:    Lab Results:  CBC: Recent Labs  10/15/15 2240 10/16/15 0359  WBC 11.6* 14.4*  HGB 9.2* 9.0*  HCT 27.6* 27.2*  PLT 89* 120*    BMET:  Recent Labs  10/14/15 1201  10/15/15 2226 10/15/15 2240 10/16/15 0359  NA 136  < > 143  --  145  K 5.3  < > 4.4  --  3.7  CL 99  < > 109  --  114*  CO2 24  --   --   --  23  GLUCOSE 92  < > 151*  --  97  BUN 22  < > 13  --  12  CREATININE 1.32*  < > 0.90 1.14* 1.10*  CALCIUM 10.3  --   --   --  7.7*  < > = values in this interval not displayed.   PT/INR:   Recent Labs  10/15/15 1700  LABPROT 18.1*  INR 1.49    CBG (last 3)   Recent Labs  10/16/15 0136 10/16/15 0245 10/16/15 0356  GLUCAP 106* 97 90    ABG      Component Value Date/Time   PHART 7.318* 10/16/2015 0402   PCO2ART 42.3 10/16/2015 0402   PO2ART 84.0 10/16/2015 0402   HCO3 21.9 10/16/2015 0402   TCO2 23 10/16/2015 0402   ACIDBASEDEF 4.0* 10/16/2015 0402   O2SAT 96.0 10/16/2015 0402    CXR: PORTABLE CHEST 1 VIEW  COMPARISON: 10/15/2015.  FINDINGS: Interim extubation removal of NG tube. Swan-Ganz catheter noted with tip in the pulmonary outflow tract. Chest tubes in stable position. No pneumothorax. Prior cardiac valve repair. Cardiomegaly with mild pulmonary vascular prominence and and basilar infiltrates suggesting development of congestive heart failure. Small right pleural effusion.  IMPRESSION: 1. Interim extubation and removal of NG tube. Swan-Ganz catheter tip noted in the pulmonary outflow tract on today's exam. Chest tubes in stable position. No pneumothorax.  2. Cardiomegaly with pulmonary vascular prominence and development of bibasilar infiltrates. These findings suggest a component of congestive heart failure. Bilateral small pleural effusions.  3. Prior cardiac valve repair.   Electronically Signed  By: Marcello Moores Register  On: 10/16/2015 07:54   EKG: NSR w/out  acute ischemic changes, prolonged QT   Assessment/Plan: S/P Procedure(s) (LRB): MINIMALLY INVASIVE MITRAL VALVE REPAIR (MVR) (Right) TRANSESOPHAGEAL ECHOCARDIOGRAM (TEE) (N/A)  Doing well POD1 Maintaining NSR w/ stable hemodynamics, on low dose Neo for BP support Breathing comfortably w/ O2 sats 99% on 4 L/min Expected post op acute blood loss anemia, mild, Hgb stable 9.0 Expected post op atelectasis, mild, R>L Expected post op volume excess, mild Post op thrombocytopenia, platelet count up to 120k CKD stage III - creatinine stable and below preop baseline   Mobilize  D/C lines  Wean Neo drip as tolerated  Hold diuretics until BP improves some  Watch renal function  Leave chest tubes in for now  Start coumadin  slowly  Hold amiodarone and recheck QT  Rexene Alberts, MD 10/16/2015 8:21 AM

## 2015-10-16 NOTE — Procedures (Signed)
Extubation Procedure Note  Patient Details:   Name: Cristina Garcia DOB: 01-Sep-1960 MRN: [id number]   Airway Documentation:  Pt completed Rapid Wean without complication. RN called to ok extubation following ABG outside normal value. NIF -30, VC 900 mls, cuff leak audible. Pt followed all commands. Pt extubated to 4L [place]. Pt speaks name/location without complication. No Stridor. Clear BBS.   Evaluation  O2 sats: stable throughout Complications: No apparent complications Patient did tolerate procedure well. Bilateral Breath Sounds: Clear   Yes  Sharen Hint 10/16/2015, 2:04 AM

## 2015-10-16 NOTE — Telephone Encounter (Signed)
Left message on voicemail informing pt of stable labs.

## 2015-10-16 NOTE — Telephone Encounter (Signed)
-----   Message from Ladell Pier, MD sent at 10/14/2015  5:32 PM EDT ----- Please call patient, light chains are stable

## 2015-10-16 NOTE — Progress Notes (Signed)
[facility] BRIEF SICU PROGRESS NOTE  1 Day Post-Op  S/P Procedure(s) (LRB): MINIMALLY INVASIVE MITRAL VALVE REPAIR (MVR) (Right) TRANSESOPHAGEAL ECHOCARDIOGRAM (TEE) (N/A)   Doing well.  Sat up in chair most of the day.  Mild soreness in chest.  No SOB Maintaining NSR but still on low dose Neo drip for BP O2 sats 98% on 2 L/min via [place] UOP adequate Labs stable  Plan: Continue current plan  Rexene Alberts, MD 10/16/2015 6:04 PM

## 2015-10-17 ENCOUNTER — Inpatient Hospital Stay (HOSPITAL_COMMUNITY): Payer: BLUE CROSS/BLUE SHIELD

## 2015-10-17 LAB — GLUCOSE, CAPILLARY
GLUCOSE-CAPILLARY: 96 mg/dL (ref 65–99)
GLUCOSE-CAPILLARY: 80 mg/dL (ref 65–99)
GLUCOSE-CAPILLARY: 117 mg/dL — AB (ref 65–99)
Glucose-Capillary: 113 mg/dL — ABNORMAL HIGH (ref 65–99)
Glucose-Capillary: 158 mg/dL — ABNORMAL HIGH (ref 65–99)
Glucose-Capillary: 179 mg/dL — ABNORMAL HIGH (ref 65–99)

## 2015-10-17 LAB — BASIC METABOLIC PANEL
Anion gap: 6 (ref 5–15)
BUN: 14 mg/dL (ref 6–20)
CALCIUM: 7.9 mg/dL — AB (ref 8.9–10.3)
CHLORIDE: 108 mmol/L (ref 101–111)
CO2: 22 mmol/L (ref 22–32)
CREATININE: 0.97 mg/dL (ref 0.44–1.00)
GFR calc Af Amer: >60 mL/min (ref >60)
GFR calc non Af Amer: >60 mL/min (ref >60)
GLUCOSE: 109 mg/dL — AB (ref 65–99)
Potassium: 4.4 mmol/L (ref 3.5–5.1)
Sodium: 136 mmol/L (ref 135–145)

## 2015-10-17 LAB — CBC
HEMATOCRIT: 28.6 % — AB (ref 36.0–46.0)
HEMOGLOBIN: 9.2 g/dL — AB (ref 12.0–15.0)
MCH: 31.5 pg (ref 26.0–34.0)
MCHC: 32.2 g/dL (ref 30.0–36.0)
MCV: 97.9 fL (ref 78.0–100.0)
Platelets: 84 10*3/uL — ABNORMAL LOW (ref 150–400)
RBC: 2.92 MIL/uL — ABNORMAL LOW (ref 3.87–5.11)
RDW: 13.1 % (ref 11.5–15.5)
WBC: 12.7 10*3/uL — ABNORMAL HIGH (ref 4.0–10.5)

## 2015-10-17 LAB — PROTIME-INR
INR: 1.31 (ref 0.00–1.49)
Prothrombin Time: 16.4 seconds — ABNORMAL HIGH (ref 11.6–15.2)

## 2015-10-17 MED ORDER — SODIUM CHLORIDE 0.9% FLUSH
3.0000 mL | Freq: Two times a day (BID) | INTRAVENOUS | Status: DC
  Administered 2015-10-17: 3 mL via INTRAVENOUS

## 2015-10-17 MED ORDER — FUROSEMIDE 40 MG PO TABS
40.0000 mg | ORAL_TABLET | Freq: Every day | ORAL | Status: DC
  Administered 2015-10-17 – 2015-10-21 (×5): 40 mg via ORAL
  Filled 2015-10-17 (×5): qty 1

## 2015-10-17 MED ORDER — METOPROLOL TARTRATE 12.5 MG HALF TABLET
12.5000 mg | ORAL_TABLET | Freq: Two times a day (BID) | ORAL | Status: DC
  Administered 2015-10-18 – 2015-10-21 (×5): 12.5 mg via ORAL
  Filled 2015-10-17 (×6): qty 1

## 2015-10-17 MED ORDER — ASPIRIN EC 81 MG PO TBEC
81.0000 mg | DELAYED_RELEASE_TABLET | Freq: Every day | ORAL | Status: DC
  Administered 2015-10-17 – 2015-10-21 (×5): 81 mg via ORAL
  Filled 2015-10-17 (×5): qty 1

## 2015-10-17 MED ORDER — FUROSEMIDE 10 MG/ML IJ SOLN: 20.0000 mg | Freq: Once | INTRAMUSCULAR | Status: DC

## 2015-10-17 MED ORDER — MOVING RIGHT ALONG BOOK
Freq: Once | Status: AC
  Administered 2015-10-17: 08:00:00
  Filled 2015-10-17: qty 1

## 2015-10-17 MED ORDER — POTASSIUM CHLORIDE CRYS ER 20 MEQ PO TBCR
20.0000 meq | EXTENDED_RELEASE_TABLET | Freq: Every day | ORAL | Status: DC
  Administered 2015-10-18 – 2015-10-19 (×2): 20 meq via ORAL
  Filled 2015-10-17 (×2): qty 1

## 2015-10-17 MED ORDER — SODIUM CHLORIDE 0.9 % IV SOLN: 250.0000 mL | INTRAVENOUS | Status: DC | PRN

## 2015-10-17 MED ORDER — SODIUM CHLORIDE 0.9% FLUSH: 3.0000 mL | INTRAVENOUS | Status: DC | PRN

## 2015-10-17 NOTE — Care Management Note (Signed)
Case Management Note  Patient Details  Name: Cristina Garcia MRN: [id number] Date of Birth: [birth year]  Subjective/Objective:     Pt s/p MINI MVR               Action/Plan:  PTA - independent from home with husband.  CM will continue to monitor for disposition needs   Expected Discharge Date:                  Expected Discharge Plan:  Home/Self Care  In-House Referral:     Discharge planning Services  CM Consult  Post Acute Care Choice:    Choice offered to:     DME Arranged:    DME Agency:     HH Arranged:    HH Agency:     Status of Service:  In process, will continue to follow  Medicare Important Message Given:    Date Medicare IM Given:    Medicare IM give by:    Date Additional Medicare IM Given:    Additional Medicare Important Message give by:     If discussed at [place] of Stay Meetings, dates discussed:    Additional Comments:  Maryclare Labrador, RN 10/17/2015, 3:08 PM

## 2015-10-17 NOTE — Progress Notes (Addendum)
[address]       [place] [address]             [phone]        CARDIOTHORACIC SURGERY PROGRESS NOTE   R2 Days Post-Op Procedure(s) (LRB): MINIMALLY INVASIVE MITRAL VALVE REPAIR (MVR) (Right) TRANSESOPHAGEAL ECHOCARDIOGRAM (TEE) (N/A)  Subjective: Looks good.  Couldn't sleep well because of noise in ICU.  Minimal pain.  No SOB  Objective: Vital signs: BP Readings from Last 1 Encounters:  10/17/15 92/77   Pulse Readings from Last 1 Encounters:  10/15/15 73   Resp Readings from Last 1 Encounters:  10/17/15 22   Temp Readings from Last 1 Encounters:  10/17/15 97.5 F (36.4 C) Oral    Hemodynamics: PAP: (38-42)/(22-24) 38/22 mmHg  Physical Exam:  Rhythm:   sinus  Breath sounds: clear  Heart sounds:  RRR  Incisions:  Dressings dry, intact  Abdomen:  Soft, non-distended, non-tender  Extremities:  Warm, well-perfused  Chest tubes:  decreasing volume thin serosanguinous output, no air leak    Intake/Output from previous day: 05/04 0701 - 05/05 0700 In: 1776.6 [P.O.:840; I.V.:886.6; IV Piggyback:50] Out: 1610 [Urine:630; Chest Tube:980] Intake/Output this shift:    Lab Results:  CBC: Recent Labs  10/16/15 1715 10/17/15 0430  WBC 17.2* 12.7*  HGB 9.7* 9.2*  HCT 29.5* 28.6*  PLT 117* 84*    BMET:  Recent Labs  10/16/15 0359 10/16/15 1713 10/16/15 1715 10/17/15 0430  NA 145 139  --  136  K 3.7 4.3  --  4.4  CL 114* 108  --  108  CO2 23  --   --  22  GLUCOSE 97 155*  --  109*  BUN 12 15  --  14  CREATININE 1.10* 0.90 1.10* 0.97  CALCIUM 7.7*  --   --  7.9*     PT/INR:   Recent Labs  10/17/15 0430  LABPROT 16.4*  INR 1.31    CBG (last 3)   Recent Labs  10/16/15 1952 10/17/15 0001 10/17/15 0419  GLUCAP 130* 113* 96    ABG    Component Value Date/Time   PHART 7.318* 10/16/2015 0402   PCO2ART 42.3 10/16/2015 0402   PO2ART 84.0 10/16/2015 0402   HCO3 21.9 10/16/2015 0402   TCO2 21 10/16/2015 1713   ACIDBASEDEF 4.0* 10/16/2015 0402   O2SAT 96.0 10/16/2015 0402    CXR: PORTABLE CHEST 1 VIEW  COMPARISON: 10/16/2015.  FINDINGS: Interval removal of Swan-Ganz catheter. Right chest tubes in stable position. Prior cardiac valve repair. Stable mild cardiomegaly. Stable bibasilar pulmonary infiltrates and or edema. Stable small pleural effusions. Minimal chest wall subcutaneous emphysema.  IMPRESSION: 1. Interim removal Swan-Ganz catheter. Right chest tubes in stable position. No pneumothorax. 2. Prior cardiac valve repair. Stable cardiomegaly. 3. Stable bibasilar infiltrates/ edema and small pleural effusions.   Electronically Signed  By: Marcello Moores Register  On: 10/17/2015 07:23  Assessment/Plan: S/P Procedure(s) (LRB): MINIMALLY INVASIVE MITRAL VALVE REPAIR (MVR) (Right) TRANSESOPHAGEAL ECHOCARDIOGRAM (TEE) (N/A)  Doing well POD2 Maintaining NSR w/ stable BP on very low dose Neo for BP support Breathing comfortably w/ O2 sats 99% on 2 L/min Expected post op acute blood loss anemia, mild, Hgb stable 9.2 Expected post op atelectasis, mild, R>L Expected post op volume excess, mild Post op thrombocytopenia, platelet count down slightly to 84k CKD stage III - creatinine stable and below preop baseline   Mobilize  Wean Neo drip off  Transfer stepdown once stable off  Neo  Begin gentle diuresis and restart metoprolol once BP improved  Continue to hold lisinopril  Continue coumadin  Watch platelet count  Leave chest tubes in today, possibly d/c tomorrow or Sunday depending on drainage  Anticipate likely d/c home on Monday   Rexene Alberts, MD 10/17/2015 7:37 AM

## 2015-10-17 NOTE — Discharge Summary (Signed)
Physician Discharge Summary       [address]       [place] [address]             [phone]    Patient ID: JOHANN GASCOIGNE MRN: [id number] DOB/AGE: 12/17/1960 55 y.o.  Admit date: 10/15/2015 Discharge date: 10/21/2015  Admission Diagnoses: Severe mitral regurgitation  Active Diagnoses:  1.Essential hypertension 2. Atrial tachycardia ([place]) 3. Chronic kidney disease (CKD) 4. Multiple Myeloma 5. Osteoporosis 6. Anxiety 7. ABL anemia 8. Thrombocytopenia  Procedure (s):   Minimally-Invasive Mitral Valve Repair Complex valvuloplasty including Alfieri edge-to-edge repair Quadrangular resection (shortening) of entire posterior leaflet Sliding leaflet plasty Artificial Gore-tex neochord placement x6 Edwards Physio II Ring Annuloplasty (size 55m, model # [id number] serial # [id number] by Dr. ORoxy Mannson 10/15/2015.  History of Presenting Illness: This is a 55year old female with long-standing history of mitral valve prolapse who has been referred for surgical consultation to discuss treatment options for management of severe symptomatic mitral regurgitation. The patient states that she was first diagnosed with mitral valve prolapse approximately 30 years ago. She denies any family history of mitral valve prolapse or collagen vascular disease, although her mother was reported to have rheumatic fever and underwent mitral valve surgery in the past. The patient has been followed for the last few years by Dr. MAundra Dubin Transthoracic and transesophageal echocardiogram performed in 2015 revealed bileaflet prolapse with moderate to severe mitral regurgitation. Left ventricular size and function were normal. The patient was asymptomatic at that time. In 2016 the patient began to experience increasing palpitations. She underwent outpatient Holter monitor and was found to have frequent PACs and short runs of atrial tachycardia  without any sustained arrhythmias or atrial fibrillation. She was started on Toprol-XL. Over the past 6 months patient has complained of worsening fatigue. She does not experience shortness of breath per se, although she claims that she feels as though she "can't take as deep a breath". She gets tired much more easily than she used to. She has occasional mild lightheadedness without syncope. Symptoms have made it more difficult for her to sleep at night, although she denies any symptoms or orthopnea or PND. She has never had any lower extremity edema. She was seen in follow-up recently by Dr. MAundra Dubin Transthoracic echocardiogram revealed severe bileaflet prolapse with severe mitral regurgitation. Left ventricular systolic function was preserved. Transesophageal echocardiogram was performed 08/18/2015. This confirmed the presence of bileaflet prolapse with severe mitral regurgitation. There was systolic flow reversal in the pulmonary veins. Left ventricular size and systolic function remained normal with ejection fraction estimated 55%. Left and right heart catheterization was notable for the absence of coronary artery disease. Pulmonary artery pressures were normal although there were large V waves seen on pulmonary capillary wedge tracing. Patient was referred for surgical consultation.  The patient is married [place] 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 She complains of progressive exertional fatigue. She does not spirits significant exertional shortness of breath. She denies any history of exertional chest pain or chest tightness. She denies any history of PND, orthopnea, or lower extremity edema. She has occasional mild dizzy spells without syncope. She has frequent palpitations although the symptoms have improved somewhat on beta  blocker therapy.              Brief Hospital Course:  The  patient was extubated the morning of post operative day one without difficulty. She remained afebrile and hemodynamically stable. She was weaned off the Neo Synephrine drip. Gordy Councilman, a line, and foley were removed early in the post operative course. Chest tubes remained for a few days then were removed. Lopressor was started and titrated accordingly. She was volume over loaded but because of hypotension, diuresis's was not begun until a few days post op.  She had ABL anemia. She did not require a post op transfusion. Her last H and H was 8/25. She also had thrombocytopenia. Her last platelet count was 101. She was weaned off the insulin drip.  The patient's HGA1C pre op was 5.1 .  The patient's glucose remained well controlled. The patient was felt surgically stable for transfer from the ICU to [facility] for further convalescence on 10/17/2015. She continues to progress with cardiac rehab. She initially required 2 liters of oxygen via [place].She was later weaned to room air. She has been tolerating a diet and has had a bowel movement. She is on oral coumadin.Epicardial pacing wires and chest tube sutures will be removed prior to discharge. The patient is felt surgically stable for discharge today.    Latest Vital Signs: Blood pressure 106/69, pulse 93, temperature 98.9 F (37.2 C), temperature source Oral, resp. rate 19, height _0  (1.753 m), weight 138 lb 7.2 oz (62.8 kg), SpO2 98 %.  Physical Exam: Rhythm: sinus Breath sounds:clear Heart sounds:RRR Incisions:Dressings dry, intact Abdomen:Soft, non-distended, non-tender Extremities:Warm, well-perfused Chest tubes:decreasing volume thin serosanguinous output, no air leak  Discharge  Condition: Stable and discharged to home  Recent laboratory studies:  Lab Results  Component Value Date   WBC 6.5 10/21/2015   HGB 8.0* 10/21/2015   HCT 25.4* 10/21/2015   MCV 99.6 10/21/2015   PLT 101* 10/21/2015   Lab Results  Component Value Date   NA 133* 10/21/2015   K 3.8 10/21/2015   CL 99* 10/21/2015   CO2 26 10/21/2015   CREATININE 1.13* 10/21/2015   GLUCOSE 98 10/21/2015   Diagnostic Studies:    Dg Chest Port 1 View  10/17/2015  CLINICAL DATA:  Atelectasis. EXAM: PORTABLE CHEST 1 VIEW COMPARISON:  10/16/2015. FINDINGS: Interval removal of Swan-Ganz catheter. Right chest tubes in stable position. Prior cardiac valve repair. Stable mild cardiomegaly. Stable bibasilar pulmonary infiltrates and or edema. Stable small pleural effusions. Minimal chest wall subcutaneous emphysema. IMPRESSION: 1. Interim removal Swan-Ganz catheter. Right chest tubes in stable position. No pneumothorax. 2. Prior cardiac valve repair.  Stable cardiomegaly. 3. Stable bibasilar infiltrates/ edema and small pleural effusions. Electronically Signed   By: Marcello Moores  Register   On: 10/17/2015 07:23    Discharge Medications:   Medication List    STOP taking these medications        amiodarone 200 MG tablet  Commonly known as:  PACERONE     lisinopril 5 MG tablet  Commonly known as:  PRINIVIL,ZESTRIL      TAKE these medications        acyclovir 200 MG capsule  Commonly known as:  ZOVIRAX  TAKE 3 CAPSULES BY MOUTH IN THE MORNING & 2 CAPSULES IN THE EVENING UNTIL WELL, THEN 1 CAPSULE DAILY     aspirin 81 MG tablet  Take 81 mg by mouth daily. Reported on 10/08/2015     calcium carbonate 600 MG Tabs tablet  Commonly known as:  OS-CAL  Take 600 mg by mouth daily with breakfast.     conjugated estrogens  vaginal cream  Commonly known as:  PREMARIN  Place vaginally 2 (two) times a week.     ferrous [id number] C-folic acid capsule  Commonly known as:  TRINSICON / FOLTRIN  Take 1 capsule  by mouth 3 (three) times daily after meals.     furosemide 40 MG tablet  Commonly known as:  LASIX  Take 1 tablet (40 mg total) by mouth daily.     loratadine 10 MG tablet  Commonly known as:  CLARITIN  Take 10 mg by mouth daily.     metoprolol succinate 25 MG 24 hr tablet  Commonly known as:  TOPROL-XL  TAKE 1 TABLET (25 MG TOTAL) BY MOUTH DAILY.     MULTIVITAL PO  Take 1 tablet by mouth daily.     oxyCODONE 5 MG immediate release tablet  Commonly known as:  Oxy IR/ROXICODONE  Take 1-2 tablets (5-10 mg total) by mouth every 6 (six) hours as needed for severe pain.     potassium chloride SA 20 MEQ tablet  Commonly known as:  K-DUR,KLOR-CON  Take 1 tablet (20 mEq total) by mouth daily.     warfarin 5 MG tablet  Commonly known as:  COUMADIN  Take 1 tablet (5 mg total) by mouth daily at 6 PM. And as prescribed by the coumadin clinic       The patient has been discharged on:   1.Beta Blocker:  Yes [ x  ]                              No   [   ]                              If No, reason:  2.Ace Inhibitor/ARB: Yes [   ]                                     No  [    ]                                     If No, reason:labile BP  3.Statin:   Yes [   ]                  No  [ x  ]                  If No, reason:  4.Ecasa:  Yes  [ x  ]                  No   [   ]                  If No, reason:  Follow Up Appointments: Follow-up Information    Follow up with Rexene Alberts, MD On 11/03/2015.   Specialty:  Cardiothoracic Surgery   Why:  PA/LAT CXR to be taken (at [facility] which is in the same building as Dr. Guy Sandifer office) on 11/03/2015 at 3:00 pm;Appointment time is at 3:30 pm   Contact information:   [place] [address] [phone]       Follow up with Eileen Stanford, PA-C On 11/06/2015.  Specialties:  Cardiology, Radiology   Why:  Appointment time is at 9:30 am   Contact information:   [address]  [place] [address] [phone]       Follow up with [facility].   Specialty:  Cardiology   Why:  next thurs or friday- office should contact you, call them if they dont to arrange   Contact information:   [address] [place]      Signed: Macy Mis 10/21/2015, 7:55 AM

## 2015-10-17 NOTE — Progress Notes (Signed)
Patient examined and record reviewed.Hemodynamics stable,labs satisfactory.Patient had stable day.Continue current care. [name] 10/17/2015

## 2015-10-18 LAB — BASIC METABOLIC PANEL
ANION GAP: 6 (ref 5–15)
BUN: 14 mg/dL (ref 6–20)
CO2: 25 mmol/L (ref 22–32)
Calcium: 7.9 mg/dL — ABNORMAL LOW (ref 8.9–10.3)
Chloride: 104 mmol/L (ref 101–111)
Creatinine, Ser: 1.09 mg/dL — ABNORMAL HIGH (ref 0.44–1.00)
GFR calc Af Amer: >60 mL/min (ref >60)
GFR, EST NON AFRICAN AMERICAN: 56 mL/min — AB (ref >60)
Glucose, Bld: 97 mg/dL (ref 65–99)
POTASSIUM: 3.9 mmol/L (ref 3.5–5.1)
SODIUM: 135 mmol/L (ref 135–145)

## 2015-10-18 LAB — CBC
HCT: 26.6 % — ABNORMAL LOW (ref 36.0–46.0)
Hemoglobin: 8.6 g/dL — ABNORMAL LOW (ref 12.0–15.0)
MCH: 31.7 pg (ref 26.0–34.0)
MCHC: 32.3 g/dL (ref 30.0–36.0)
MCV: 98.2 fL (ref 78.0–100.0)
PLATELETS: 76 10*3/uL — AB (ref 150–400)
RBC: 2.71 MIL/uL — AB (ref 3.87–5.11)
RDW: 13.1 % (ref 11.5–15.5)
WBC: 9.8 10*3/uL (ref 4.0–10.5)

## 2015-10-18 LAB — PROTIME-INR
INR: 1.2 (ref 0.00–1.49)
PROTHROMBIN TIME: 15.4 s — AB (ref 11.6–15.2)

## 2015-10-18 MED ORDER — FE FUMARATE-B12-VIT C-FA-IFC PO CAPS
1.0000 | ORAL_CAPSULE | Freq: Three times a day (TID) | ORAL | Status: DC
  Administered 2015-10-18 – 2015-10-21 (×7): 1 via ORAL
  Filled 2015-10-18 (×8): qty 1

## 2015-10-18 MED ORDER — LIDOCAINE HCL (PF) 1 % IJ SOLN
INTRAMUSCULAR | Status: AC
  Administered 2015-10-18: 09:00:00
  Filled 2015-10-18: qty 5

## 2015-10-18 MED ORDER — POTASSIUM CHLORIDE CRYS ER 20 MEQ PO TBCR: 20.0000 meq | EXTENDED_RELEASE_TABLET | Freq: Every day | ORAL | Status: DC

## 2015-10-18 NOTE — Progress Notes (Signed)
Patient sitting up in chair, no needs at this time. Call light within reach. 

## 2015-10-18 NOTE — Progress Notes (Signed)
Pt transferred to 2W24; pt ambulated entire distance with 2 standing rest breaks; chart, SCD's, and personal belongings along with transfer; RN and NT in room upon transfer.  Cristina Garcia

## 2015-10-18 NOTE — Progress Notes (Signed)
Report called to Katharine Look, RN on 2W; will transfer pt to 2W24; husband and pt made aware of transfer.  Ruben Reason

## 2015-10-18 NOTE — Progress Notes (Signed)
Attempted to call report to 2W; RN unavailable at this time.  Cristina Garcia

## 2015-10-18 NOTE — Progress Notes (Signed)
3 Days Post-Op Procedure(s) (LRB): MINIMALLY INVASIVE MITRAL VALVE REPAIR (MVR) (Right) TRANSESOPHAGEAL ECHOCARDIOGRAM (TEE) (N/A) Subjective: Doing well after mitral valve repair Off neo-, sinus rhythm, ambulating Slight bleeding at upper chest tube site treated with fixation skin suture Transferring to stepdown Chest tube still with significant drainage we'll leave in another 24 hours  Objective: Vital signs in last 24 hours: Temp:  [97.5 F (36.4 C)-98.5 F (36.9 C)] 97.8 F (36.6 C) (05/06 1138) Cardiac Rhythm:  [-] Normal sinus rhythm (05/06 0400) Resp:  [14-31] 23 (05/06 1100) BP: (82-119)/(49-100) 114/66 mmHg (05/06 1100) SpO2:  [87 %-100 %] 100 % (05/06 1100) Weight:  [147 lb 0.8 oz (66.7 kg)] 147 lb 0.8 oz (66.7 kg) (05/06 0500)  Hemodynamic parameters for last 24 hours:  stable  Intake/Output from previous day: 05/05 0701 - 05/06 0700 In: 1436.7 [P.O.:970; I.V.:466.7] Out: 1470 [Urine:850; Chest Tube:620] Intake/Output this shift: Total I/O In: -  Out: 1110 [Urine:1050; Chest Tube:60]       Exam    General- alert and comfortable   Lungs- clear without rales, wheezes   Cor- regular rate and rhythm, no murmur , gallop   Abdomen- soft, non-tender   Extremities - warm, non-tender, minimal edema   Neuro- oriented, appropriate, no focal weakness   Lab Results:  Recent Labs  10/17/15 0430 10/18/15 0410  WBC 12.7* 9.8  HGB 9.2* 8.6*  HCT 28.6* 26.6*  PLT 84* 76*   BMET:  Recent Labs  10/17/15 0430 10/18/15 0410  NA 136 135  K 4.4 3.9  CL 108 104  CO2 22 25  GLUCOSE 109* 97  BUN 14 14  CREATININE 0.97 1.09*  CALCIUM 7.9* 7.9*    PT/INR:  Recent Labs  10/18/15 0410  LABPROT 15.4*  INR 1.20   ABG    Component Value Date/Time   PHART 7.318* 10/16/2015 0402   HCO3 21.9 10/16/2015 0402   TCO2 21 10/16/2015 1713   ACIDBASEDEF 4.0* 10/16/2015 0402   O2SAT 96.0 10/16/2015 0402   CBG (last 3)   Recent Labs  10/17/15 1148 10/17/15 1546  10/17/15 2105  GLUCAP 117* 179* 158*    Assessment/Plan: S/P Procedure(s) (LRB): MINIMALLY INVASIVE MITRAL VALVE REPAIR (MVR) (Right) TRANSESOPHAGEAL ECHOCARDIOGRAM (TEE) (N/A) Mobilize Diuresis Plan for transfer to step-down: see transfer orders   LOS: 3 days    [name] 10/18/2015

## 2015-10-19 ENCOUNTER — Inpatient Hospital Stay (HOSPITAL_COMMUNITY): Payer: BLUE CROSS/BLUE SHIELD

## 2015-10-19 LAB — BASIC METABOLIC PANEL
Anion gap: 7 (ref 5–15)
BUN: 12 mg/dL (ref 6–20)
CO2: 28 mmol/L (ref 22–32)
Calcium: 8.3 mg/dL — ABNORMAL LOW (ref 8.9–10.3)
Chloride: 102 mmol/L (ref 101–111)
Creatinine, Ser: 1.09 mg/dL — ABNORMAL HIGH (ref 0.44–1.00)
GFR calc Af Amer: >60 mL/min (ref >60)
GFR calc non Af Amer: 56 mL/min — ABNORMAL LOW (ref >60)
Glucose, Bld: 91 mg/dL (ref 65–99)
Potassium: 4.1 mmol/L (ref 3.5–5.1)
Sodium: 137 mmol/L (ref 135–145)

## 2015-10-19 LAB — CBC
HCT: 27.3 % — ABNORMAL LOW (ref 36.0–46.0)
Hemoglobin: 8.7 g/dL — ABNORMAL LOW (ref 12.0–15.0)
MCH: 31.4 pg (ref 26.0–34.0)
MCHC: 31.9 g/dL (ref 30.0–36.0)
MCV: 98.6 fL (ref 78.0–100.0)
Platelets: 85 10*3/uL — ABNORMAL LOW (ref 150–400)
RBC: 2.77 MIL/uL — ABNORMAL LOW (ref 3.87–5.11)
RDW: 12.9 % (ref 11.5–15.5)
WBC: 7.5 10*3/uL (ref 4.0–10.5)

## 2015-10-19 LAB — PROTIME-INR
INR: 1.14 (ref 0.00–1.49)
Prothrombin Time: 14.8 seconds (ref 11.6–15.2)

## 2015-10-19 MED ORDER — WARFARIN VIDEO
Freq: Once | Status: AC
  Administered 2015-10-19: 18:00:00

## 2015-10-19 MED ORDER — PATIENT'S GUIDE TO USING COUMADIN BOOK
Freq: Once | Status: AC
  Administered 2015-10-19: 18:00:00
  Filled 2015-10-19: qty 1

## 2015-10-19 MED ORDER — WARFARIN SODIUM 5 MG PO TABS
5.0000 mg | ORAL_TABLET | Freq: Every day | ORAL | Status: DC
  Administered 2015-10-19 – 2015-10-20 (×2): 5 mg via ORAL
  Filled 2015-10-19 (×2): qty 1

## 2015-10-19 NOTE — Progress Notes (Signed)
Patient sitting up in bed, daughter present at bedside. Is having some pain but declined pain medication at this time. Call light within reach.

## 2015-10-19 NOTE — Progress Notes (Addendum)
      [address]       [place] [address]             [phone]        4 Days Post-Op Procedure(s) (LRB): MINIMALLY INVASIVE MITRAL VALVE REPAIR (MVR) (Right) TRANSESOPHAGEAL ECHOCARDIOGRAM (TEE) (N/A)  Subjective: Patient unable to sleep on back as is a side sleeper. Otherwise,no complaints.  Objective: Vital signs in last 24 hours: Temp:  [97.8 F (36.6 C)-98.7 F (37.1 C)] 98.5 F (36.9 C) (05/07 0440) Pulse Rate:  [81-90] 81 (05/07 0440) Cardiac Rhythm:  [-] Normal sinus rhythm (05/06 1900) Resp:  [17-27] 18 (05/07 0440) BP: (89-129)/(49-100) 99/55 mmHg (05/07 0440) SpO2:  [95 %-100 %] 96 % (05/07 0440) Weight:  [144 lb 3.2 oz (65.409 kg)] 144 lb 3.2 oz (65.409 kg) (05/07 0113)  Pre op weight 58 kg Current Weight  10/19/15 144 lb 3.2 oz (65.409 kg)    Intake/Output from previous day: 05/06 0701 - 05/07 0700 In: 180 [P.O.:180] Out: 1950 [Urine:1500; Chest Tube:450]   Physical Exam:  Cardiovascular: RRR, no murmur Pulmonary: Diminished at bases Abdomen: Soft, non tender, bowel sounds present. Extremities: Mild bilateral lower extremity edema. Wounds: Clean and dry.  No erythema or signs of infection.  Lab Results: CBC: Recent Labs  10/17/15 0430 10/18/15 0410  WBC 12.7* 9.8  HGB 9.2* 8.6*  HCT 28.6* 26.6*  PLT 84* 76*   BMET:  Recent Labs  10/17/15 0430 10/18/15 0410  NA 136 135  K 4.4 3.9  CL 108 104  CO2 22 25  GLUCOSE 109* 97  BUN 14 14  CREATININE 0.97 1.09*  CALCIUM 7.9* 7.9*    PT/INR:  Lab Results  Component Value Date   INR 1.20 10/18/2015   INR 1.31 10/17/2015   INR 1.49 10/15/2015   ABG:  INR: Will add last result for INR, ABG once components are confirmed Will add last 4 CBG results once components are confirmed  Assessment/Plan:  1. CV - SR in the 80's. On Lopressor 12.5 mg bid and Coumadin. INR decreased from 1.2 to 1.09. Will increase Coumadin to 5 mg daily. 2.  Pulmonary - On room air. Chest  tube output 450 cc last 24 hours. Chest tubes to remain today. CXR this am shows no pneumothorax, small bilateral pleural effusions, consolidation RLL. Encourage incentive spirometer. 3. Volume Overload - On Lasix 40 mg daily 4.  Acute blood loss anemia - H and H this am 8.7 and 27.3. Continue Trinsicon. 5. Thrombocytopenia-platelets up to 85,000 6. Labs not done this am yet. Will follow up on results  ZIMMERMAN,DONIELLE MPA-C 10/19/2015,8:52 AM  Chest tubes drained 350 yesterday Chest x-ray today shows mild right-sided atelectasis Patient with stable blood pressure and in sinus rhythm Ambulating in hallway Continue with Coumadin load and daily Lasix

## 2015-10-19 NOTE — Discharge Instructions (Signed)
Activity: 1.May walk up steps                2.No lifting more than ten pounds for two weeks.                 3.No driving for two weeks.                4.Stop any activity that causes chest pain, shortness of breath, dizziness,   sweating or excessive weakness.                5.Avoid straining.                6.Continue with your breathing exercises daily.  Diet: Low fat, Low salt diet  Wound Care: May shower.  Clean wounds with mild soap and water daily. Contact the office at [phone] if any problems arise.  Information on my medicine - Coumadin   (Warfarin)  This medication education was reviewed with me or my healthcare representative as part of my discharge preparation.  The pharmacist that spoke with me during my hospital stay was:  Georgina Peer, [facility]  Why was Coumadin prescribed for you? Coumadin was prescribed for you because you have a blood clot or a medical condition that can cause an increased risk of forming blood clots. Blood clots can cause serious health problems by blocking the flow of blood to the heart, lung, or brain. Coumadin can prevent harmful blood clots from forming. As a reminder your indication for Coumadin is:   Blood Clot Prevention After Heart Valve Surgery  What test will check on my response to Coumadin? While on Coumadin (warfarin) you will need to have an INR test regularly to ensure that your dose is keeping you in the desired range. The INR (international normalized ratio) number is calculated from the result of the laboratory test called prothrombin time (PT).  If an INR APPOINTMENT HAS NOT ALREADY BEEN MADE FOR YOU please schedule an appointment to have this lab work done by your health care provider within 7 days. Your INR goal is usually a number between:  2 to 3 or your provider may give you a more narrow range like 2-2.5.  Ask your health care provider during an office visit what your goal INR is.  What  do you need to  know  About   COUMADIN? Take Coumadin (warfarin) exactly as prescribed by your healthcare provider about the same time each day.  DO NOT stop taking without talking to the doctor who prescribed the medication.  Stopping without other blood clot prevention medication to take the place of Coumadin may increase your risk of developing a new clot or stroke.  Get refills before you run out.  What do you do if you miss a dose? If you miss a dose, take it as soon as you remember on the same day then continue your regularly scheduled regimen the next day.  Do not take two doses of Coumadin at the same time.  Important Safety Information A possible side effect of Coumadin (Warfarin) is an increased risk of bleeding. You should call your healthcare provider right away if you experience any of the following: ? Bleeding from an injury or your nose that does not stop. ? Unusual colored urine (red or dark brown) or unusual colored stools (red or black). ? Unusual bruising for unknown reasons. ? A serious fall or if you hit your head (even if there is no bleeding).  Some  foods or medicines interact with Coumadin (warfarin) and might alter your response to warfarin. To help avoid this: ? Eat a balanced diet, maintaining a consistent amount of Vitamin K. ? Notify your provider about major diet changes you plan to make. ? Avoid alcohol or limit your intake to 1 drink for women and 2 drinks for men per day. (1 drink is 5 oz. wine, 12 oz. beer, or 1.5 oz. liquor.)  Make sure that ANY health care provider who prescribes medication for you knows that you are taking Coumadin (warfarin).  Also make sure the healthcare provider who is monitoring your Coumadin knows when you have started a new medication including herbals and non-prescription products.  Coumadin (Warfarin)  Major Drug Interactions  Increased Warfarin Effect Decreased Warfarin Effect  Alcohol (large quantities) Antibiotics (esp. Septra/Bactrim, Flagyl,  Cipro) Amiodarone (Cordarone) Aspirin (ASA) Cimetidine (Tagamet) Megestrol (Megace) NSAIDs (ibuprofen, naproxen, etc.) Piroxicam (Feldene) Propafenone (Rythmol SR) Propranolol (Inderal) Isoniazid (INH) Posaconazole (Noxafil) Barbiturates (Phenobarbital) Carbamazepine (Tegretol) Chlordiazepoxide (Librium) Cholestyramine (Questran) Griseofulvin Oral Contraceptives Rifampin Sucralfate (Carafate) Vitamin K   Coumadin (Warfarin) Major Herbal Interactions  Increased Warfarin Effect Decreased Warfarin Effect  Garlic Ginseng Ginkgo biloba Coenzyme Q10 Green tea St. Johns wort    Coumadin (Warfarin) FOOD Interactions  Eat a consistent number of servings per week of foods HIGH in Vitamin K (1 serving =  cup)  Collards (cooked, or boiled & drained) Kale (cooked, or boiled & drained) Mustard greens (cooked, or boiled & drained) Parsley *serving size only =  cup Spinach (cooked, or boiled & drained) Swiss chard (cooked, or boiled & drained) Turnip greens (cooked, or boiled & drained)  Eat a consistent number of servings per week of foods MEDIUM-HIGH in Vitamin K (1 serving = 1 cup)  Asparagus (cooked, or boiled & drained) Broccoli (cooked, boiled & drained, or raw & chopped) Brussel sprouts (cooked, or boiled & drained) *serving size only =  cup Lettuce, raw (green leaf, endive, romaine) Spinach, raw Turnip greens, raw & chopped   These websites have more information on Coumadin (warfarin):  FailFactory.se; VeganReport.com.au;

## 2015-10-20 ENCOUNTER — Inpatient Hospital Stay (HOSPITAL_COMMUNITY): Payer: BLUE CROSS/BLUE SHIELD

## 2015-10-20 LAB — PROTIME-INR
INR: 1.21 (ref 0.00–1.49)
PROTHROMBIN TIME: 15.4 s — AB (ref 11.6–15.2)

## 2015-10-20 MED ORDER — POTASSIUM CHLORIDE CRYS ER 20 MEQ PO TBCR
20.0000 meq | EXTENDED_RELEASE_TABLET | Freq: Every day | ORAL | Status: DC
  Administered 2015-10-20 – 2015-10-21 (×2): 20 meq via ORAL
  Filled 2015-10-20 (×2): qty 1

## 2015-10-20 NOTE — Progress Notes (Addendum)
[address]       [place] [address]             [phone]      5 Days Post-Op Procedure(s) (LRB): MINIMALLY INVASIVE MITRAL VALVE REPAIR (MVR) (Right) TRANSESOPHAGEAL ECHOCARDIOGRAM (TEE) (N/A) Subjective: Feels pretty well  Objective: Vital signs in last 24 hours: Temp:  [97.5 F (36.4 C)-98.5 F (36.9 C)] 98.5 F (36.9 C) (05/08 0545) Pulse Rate:  [85-95] 85 (05/08 0545) Cardiac Rhythm:  [-] Normal sinus rhythm (05/07 1900) Resp:  [14-18] 14 (05/08 0545) BP: (97-114)/(52-86) 114/86 mmHg (05/08 0545) SpO2:  [98 %-99 %] 99 % (05/08 0545) Weight:  [141 lb 3.2 oz (64.048 kg)] 141 lb 3.2 oz (64.048 kg) (05/08 0545)  Hemodynamic parameters for last 24 hours:    Intake/Output from previous day: 05/07 0701 - 05/08 0700 In: 480 [P.O.:480] Out: 100 [Chest Tube:100] Intake/Output this shift:   Lab Results: Physical Exam  Constitutional: She appears well-developed and well-nourished.  Cardiovascular: Normal rate and regular rhythm.  Exam reveals no friction rub.   No murmur heard. Pulmonary/Chest: No respiratory distress.  Din in lower fields   Abdominal: Soft. There is no tenderness.  Musculoskeletal: She exhibits edema.     Recent Labs  10/18/15 0410 10/19/15 0733  WBC 9.8 7.5  HGB 8.6* 8.7*  HCT 26.6* 27.3*  PLT 76* 85*   BMET:  Recent Labs  10/18/15 0410 10/19/15 0733  NA 135 137  K 3.9 4.1  CL 104 102  CO2 25 28  GLUCOSE 97 91  BUN 14 12  CREATININE 1.09* 1.09*  CALCIUM 7.9* 8.3*    PT/INR:  Recent Labs  10/20/15 0404  LABPROT 15.4*  INR 1.21   ABG    Component Value Date/Time   PHART 7.318* 10/16/2015 0402   HCO3 21.9 10/16/2015 0402   TCO2 21 10/16/2015 1713   ACIDBASEDEF 4.0* 10/16/2015 0402   O2SAT 96.0 10/16/2015 0402   CBG (last 3)   Recent Labs  10/17/15 1148 10/17/15 1546 10/17/15 2105  GLUCAP 117* 179* 158*    Meds Scheduled Meds: . acetaminophen  1,000 mg Oral Q6H  . aspirin EC  81 mg  Oral Daily  . bisacodyl  10 mg Oral Daily   Or  . bisacodyl  10 mg Rectal Daily  . docusate sodium  200 mg Oral Daily  . ferrous [id number] C-folic acid  1 capsule Oral TID PC  . furosemide  40 mg Oral Daily  . metoprolol tartrate  12.5 mg Oral BID  . pantoprazole  40 mg Oral Daily  . potassium chloride  20 mEq Oral Daily  . sodium chloride flush  3 mL Intravenous Q12H  . warfarin  5 mg Oral q1800  . Warfarin - Physician Dosing Inpatient   Does not apply q1800   Continuous Infusions:  PRN Meds:.metoprolol, ondansetron (ZOFRAN) IV, oxyCODONE, sodium chloride flush, traMADol  Xrays Dg Chest 2 View  10/19/2015  CLINICAL DATA:  Right-sided chest tube in place. Right lung airspace consolidation. History of multiple myeloma EXAM: CHEST  2 VIEW COMPARISON:  Oct 17, 2015 FINDINGS: Left central catheter has been removed. Right-sided chest tube remains in place without apparent pneumothorax. Pacemaker wires are attached to the right heart. There are small pleural effusions bilaterally. There is airspace consolidation in the right lower lung zone laterally. No new opacity is identified. The heart is upper normal in size with pulmonary vascularity within normal limits. No adenopathy. Patient is  status post mitral valve replacement. There are no demonstrable blastic or lytic bone lesions. IMPRESSION: No pneumothorax. Small pleural effusions bilaterally with airspace consolidation in the lateral right base region. No new opacity. Stable cardiac silhouette. Electronically Signed   By: Lowella Grip III M.D.   On: 10/19/2015 07:51    Assessment/Plan: S/P Procedure(s) (LRB): MINIMALLY INVASIVE MITRAL VALVE REPAIR (MVR) (Right) TRANSESOPHAGEAL ECHOCARDIOGRAM (TEE) (N/A)   1 still with moderate, but decreasing chest tube drainage, no air leak, place to H2O seal , poss d/c later today 2 cont to diurese for volume overload 3 sinus with some pvc's, hemodyn stable 4 cont coumadin at current  dose 5 no new labs today 6pum toilet/rehab- cont  LOS: 5 days    GOLD,WAYNE E 10/20/2015  I have seen and examined the patient and agree with the assessment and plan as outlined.  D/C pacing wires and chest tubes.  Tentatively plan D/C home in am tomorrow.  Rexene Alberts, MD 10/20/2015 8:36 AM

## 2015-10-20 NOTE — Progress Notes (Signed)
Utilization review completed.  

## 2015-10-20 NOTE — Progress Notes (Signed)
CARDIAC REHAB PHASE I   PRE:  Rate/Rhythm: 92 SR  BP:  Sitting: 104/76        SaO2: 100 RA  MODE:  Ambulation: 890 ft   POST:  Rate/Rhythm: 101 ST  BP:  Sitting: 112/69         SaO2: 100 RA  Pt ambulated 890 ft on RA, rolling walker, chest tube, assist x1, steady gait, very independent, tolerated well. Pt c/o mild DOE, denies any other complaints. Pt to recliner after walk, call bell within reach. Encouraged ambulation x2 more today. Will follow.   [id number] Lenna Sciara, RN, BSN 10/20/2015 10:30 AM

## 2015-10-21 ENCOUNTER — Inpatient Hospital Stay (HOSPITAL_COMMUNITY): Payer: BLUE CROSS/BLUE SHIELD

## 2015-10-21 LAB — CBC
HCT: 25.4 % — ABNORMAL LOW (ref 36.0–46.0)
HEMOGLOBIN: 8 g/dL — AB (ref 12.0–15.0)
MCH: 31.4 pg (ref 26.0–34.0)
MCHC: 31.5 g/dL (ref 30.0–36.0)
MCV: 99.6 fL (ref 78.0–100.0)
Platelets: 101 10*3/uL — ABNORMAL LOW (ref 150–400)
RBC: 2.55 MIL/uL — ABNORMAL LOW (ref 3.87–5.11)
RDW: 13.1 % (ref 11.5–15.5)
WBC: 6.5 10*3/uL (ref 4.0–10.5)

## 2015-10-21 LAB — PROTIME-INR
INR: 1.42 (ref 0.00–1.49)
Prothrombin Time: 17.4 seconds — ABNORMAL HIGH (ref 11.6–15.2)

## 2015-10-21 LAB — BASIC METABOLIC PANEL
Anion gap: 8 (ref 5–15)
BUN: 14 mg/dL (ref 6–20)
CHLORIDE: 99 mmol/L — AB (ref 101–111)
CO2: 26 mmol/L (ref 22–32)
Calcium: 8.2 mg/dL — ABNORMAL LOW (ref 8.9–10.3)
Creatinine, Ser: 1.13 mg/dL — ABNORMAL HIGH (ref 0.44–1.00)
GFR calc Af Amer: >60 mL/min (ref >60)
GFR calc non Af Amer: 53 mL/min — ABNORMAL LOW (ref >60)
Glucose, Bld: 98 mg/dL (ref 65–99)
POTASSIUM: 3.8 mmol/L (ref 3.5–5.1)
Sodium: 133 mmol/L — ABNORMAL LOW (ref 135–145)

## 2015-10-21 MED ORDER — FUROSEMIDE 40 MG PO TABS: 40.0000 mg | ORAL_TABLET | Freq: Every day | ORAL | Status: DC

## 2015-10-21 MED ORDER — OXYCODONE HCL 5 MG PO TABS: 5.0000 mg | ORAL_TABLET | Freq: Four times a day (QID) | ORAL | Status: DC | PRN

## 2015-10-21 MED ORDER — WARFARIN SODIUM 5 MG PO TABS: 5.0000 mg | ORAL_TABLET | Freq: Every day | ORAL | Status: DC

## 2015-10-21 MED ORDER — POTASSIUM CHLORIDE CRYS ER 20 MEQ PO TBCR: 20.0000 meq | EXTENDED_RELEASE_TABLET | Freq: Every day | ORAL | Status: DC

## 2015-10-21 MED ORDER — FE FUMARATE-B12-VIT C-FA-IFC PO CAPS: 1.0000 | ORAL_CAPSULE | Freq: Three times a day (TID) | ORAL | Status: DC

## 2015-10-21 NOTE — Progress Notes (Signed)
Ed completed with pt. Voiced understanding. Will send referral to Porter Yves Dill CES, ACSM 9:09 AM 10/21/2015

## 2015-10-21 NOTE — Progress Notes (Signed)
Discussed with the patient and her husband and all questioned fully answered. She will call me if any problems arise. Pt given paper prescriptions. IVs removed, CCMD notified. Discussed follow up appointments, when to call the MD, medications, and wound care. Pt demonstrated thorough understanding of materials.   Fritz Pickerel, RN

## 2015-10-21 NOTE — Progress Notes (Addendum)
[address]       [place] [address]             [phone]      6 Days Post-Op Procedure(s) (LRB): MINIMALLY INVASIVE MITRAL VALVE REPAIR (MVR) (Right) TRANSESOPHAGEAL ECHOCARDIOGRAM (TEE) (N/A) Subjective: Feels well  Objective: Vital signs in last 24 hours: Temp:  [98.2 F (36.8 C)-98.9 F (37.2 C)] 98.9 F (37.2 C) (05/09 0532) Pulse Rate:  [88-95] 93 (05/09 0532) Cardiac Rhythm:  [-] Sinus tachycardia (05/08 1900) Resp:  [18-19] 19 (05/09 0532) BP: (83-113)/(45-83) 106/69 mmHg (05/09 0532) SpO2:  [98 %-100 %] 98 % (05/09 0532) Weight:  [138 lb 7.2 oz (62.8 kg)] 138 lb 7.2 oz (62.8 kg) (05/09 0532)  Hemodynamic parameters for last 24 hours:    Intake/Output from previous day: 05/08 0701 - 05/09 0700 In: 840 [P.O.:840] Out: -  Intake/Output this shift:    General appearance: alert, cooperative and no distress Heart: regular rate and rhythm and occas extrasystole, no murmur Lungs: mildly dim in bases Abdomen: benign Extremities: min edema Wound: incis healing well  Lab Results:  Recent Labs  10/19/15 0733 10/21/15 0315  WBC 7.5 6.5  HGB 8.7* 8.0*  HCT 27.3* 25.4*  PLT 85* 101*   BMET:  Recent Labs  10/19/15 0733 10/21/15 0315  NA 137 133*  K 4.1 3.8  CL 102 99*  CO2 28 26  GLUCOSE 91 98  BUN 12 14  CREATININE 1.09* 1.13*  CALCIUM 8.3* 8.2*    PT/INR:  Recent Labs  10/21/15 0315  LABPROT 17.4*  INR 1.42   ABG    Component Value Date/Time   PHART 7.318* 10/16/2015 0402   HCO3 21.9 10/16/2015 0402   TCO2 21 10/16/2015 1713   ACIDBASEDEF 4.0* 10/16/2015 0402   O2SAT 96.0 10/16/2015 0402   CBG (last 3)  No results for input(s): GLUCAP in the last 72 hours.  Meds Scheduled Meds: . aspirin EC  81 mg Oral Daily  . bisacodyl  10 mg Oral Daily   Or  . bisacodyl  10 mg Rectal Daily  . docusate sodium  200 mg Oral Daily  . ferrous [id number] C-folic acid  1 capsule Oral TID PC  . furosemide  40 mg  Oral Daily  . metoprolol tartrate  12.5 mg Oral BID  . pantoprazole  40 mg Oral Daily  . potassium chloride  20 mEq Oral Daily  . sodium chloride flush  3 mL Intravenous Q12H  . warfarin  5 mg Oral q1800  . Warfarin - Physician Dosing Inpatient   Does not apply q1800   Continuous Infusions:  PRN Meds:.metoprolol, ondansetron (ZOFRAN) IV, oxyCODONE, sodium chloride flush, traMADol  Xrays Dg Chest Port 1 View  10/20/2015  CLINICAL DATA:  Pneumothorax EXAM: PORTABLE CHEST 1 VIEW COMPARISON:  10/19/2015 FINDINGS: Cardiomediastinal silhouette is stable. Again noted status post mitral valve repair. Right chest tube is unchanged in position. Again noted pacemaker wires. There is no evidence of pneumothorax. Stable opacity in right midlung/infrahilar region. Trace bilateral pleural effusion. No pulmonary edema. IMPRESSION: No pulmonary edema. Stable right chest tube position without pneumothorax. Trace bilateral pleural effusion. Stable opacity in right midlung/infrahilar region. Electronically Signed   By: Lahoma Crocker M.D.   On: 10/20/2015 07:58    Assessment/Plan: S/P Procedure(s) (LRB): MINIMALLY INVASIVE MITRAL VALVE REPAIR (MVR) (Right) TRANSESOPHAGEAL ECHOCARDIOGRAM (TEE) (N/A) Plan for discharge: see discharge orders Will give 7 days of lasix/KCl 5 mg coumadin- INR thurs/Fri  LOS: 6 days    Cristina Garcia,Cristina Garcia 10/21/2015  I have seen and examined the patient and agree with the assessment and plan as outlined.  D/C home today.  Instructions given.  Rexene Alberts, MD 10/21/2015 8:56 AM

## 2015-10-24 ENCOUNTER — Ambulatory Visit (INDEPENDENT_AMBULATORY_CARE_PROVIDER_SITE_OTHER): Payer: BLUE CROSS/BLUE SHIELD | Admitting: *Deleted

## 2015-10-24 DIAGNOSIS — Z9889 Other specified postprocedural states: Secondary | ICD-10-CM

## 2015-10-24 LAB — POCT INR: INR: 1.5

## 2015-10-24 NOTE — Patient Instructions (Signed)

## 2015-10-27 ENCOUNTER — Other Ambulatory Visit: Payer: Self-pay | Admitting: *Deleted

## 2015-10-27 ENCOUNTER — Encounter (INDEPENDENT_AMBULATORY_CARE_PROVIDER_SITE_OTHER): Payer: Self-pay

## 2015-10-27 DIAGNOSIS — Z9889 Other specified postprocedural states: Secondary | ICD-10-CM

## 2015-10-29 ENCOUNTER — Telehealth: Payer: Self-pay | Admitting: Cardiology

## 2015-10-29 ENCOUNTER — Ambulatory Visit (INDEPENDENT_AMBULATORY_CARE_PROVIDER_SITE_OTHER): Payer: BLUE CROSS/BLUE SHIELD | Admitting: Family Medicine

## 2015-10-29 VITALS — BP 90/60 | HR 97 | Temp 99.5°F | Ht 69.0 in | Wt 131.0 lb

## 2015-10-29 DIAGNOSIS — R300 Dysuria: Secondary | ICD-10-CM

## 2015-10-29 DIAGNOSIS — R319 Hematuria, unspecified: Secondary | ICD-10-CM | POA: Diagnosis not present

## 2015-10-29 LAB — POCT URINALYSIS DIPSTICK
Bilirubin, UA: NEGATIVE
Glucose, UA: NEGATIVE
KETONES UA: NEGATIVE
Nitrite, UA: NEGATIVE
PH UA: 7
SPEC GRAV UA: 1.015
Urobilinogen, UA: 0.2

## 2015-10-29 MED ORDER — CEPHALEXIN 500 MG PO CAPS: 500.0000 mg | ORAL_CAPSULE | Freq: Three times a day (TID) | ORAL | Status: DC

## 2015-10-29 NOTE — Telephone Encounter (Signed)
Agree, would have patient go to PCP for urinalysis. Symptoms (including drops of blood) sound like potential UTI. INR was subtherapeutic last week at 1.5 and she has follow up in Coumadin clinic in 2 days, do not need to see her sooner for Coumadin check.

## 2015-10-29 NOTE — Telephone Encounter (Signed)
Spoke with pt. She reports noticing a pressure like feeling in bladder area this morning. Feels like she has to urinate frequently and is not emptying bladder.  Urine appears cloudy this morning.  Also noticed drops of blood with urine this morning.  Pt has primary care provider and I asked her to contact him for evaluation. I told her I would also send to coumadin clinic and they would contact her if any additional recommendations.

## 2015-10-29 NOTE — Patient Instructions (Signed)

## 2015-10-29 NOTE — Progress Notes (Signed)
Pre visit review using our clinic review tool, if applicable. No additional management support is needed unless otherwise documented below in the visit note. 

## 2015-10-29 NOTE — Telephone Encounter (Signed)
New message    The pt is concerned about blood in urine, noticed this morning, urine has a strong smell when pt goes to the bathroom. Just noticed the smell maybe about a hour ago.   Pt wanted the nurse to be aware, pt concerned it might be a bladder infection starting.

## 2015-10-29 NOTE — Progress Notes (Signed)
Subjective:    Patient ID: Cristina Garcia, female    DOB: 04-27-1961, 55 y.o.   MRN: [id number]  HPI Acute visit for mild dysuria with onset this morning. She has noted some increased frequency and mild "odor" to urine No major burning.   No fever or chills.  No flank pain. She noted some minimal hematuria this morning.  Recent mitral valve repair and patient on Coumadin. INR 1.5 on 5/12 and has follow up INR in 2 days. Recent foley catheter after surgery and this was removed 2 weeks ago. No recent UTI.   Past Medical History  Diagnosis Date  . Allergy   . H/O multiple myeloma     clinical remission  . Mitral valve prolapse   . Hypertension   . Osteoporosis   . Constipation   . Mitral regurgitation   . Atrial tachycardia ([place]) 12/18/2014    24 hour event monitor with frequent PAC's, PVC's and short runs of atrial tachycardia - no sustained arrhythmias or atrial fibrillation   . Anxiety   . Chronic kidney disease (CKD)   . S/P minimally invasive mitral valve repair 10/15/2015    Complex valvuloplasty including Alfieri edge-to-edge repair, generous quadrangular resection (shortening) of posterior leaflet, sliding leaflet plasty, Gore-tex neochord placement x6, and 38 mm Edwards Physio II ring annuloplasty via right mini thoracotomy approach    Past Surgical History  Procedure Laterality Date  . Breast enhancement surgery  2000  . Oophorectomy  1991    left  . Limbal stem cell transplant  2008  . Cesarean section  1992  . Wisdom tooth extraction  1975  . Tee without cardioversion N/A 09/05/2013    Procedure: TRANSESOPHAGEAL ECHOCARDIOGRAM (TEE);  Surgeon: Larey Dresser, MD;  Location: [facility] ENDOSCOPY;  Service: Cardiovascular;  Laterality: N/A;  . Cardiac catheterization N/A 08/18/2015    Procedure: Right/Left Heart Cath and Coronary Angiography;  Surgeon: Larey Dresser, MD;  Location: [place] CV LAB;  Service: Cardiovascular;  Laterality: N/A;  . Tee without  cardioversion N/A 08/18/2015    Procedure: TRANSESOPHAGEAL ECHOCARDIOGRAM (TEE);  Surgeon: Larey Dresser, MD;  Location: [place];  Service: Cardiovascular;  Laterality: N/A;  . Mitral valve repair Right 10/15/2015    Procedure: MINIMALLY INVASIVE MITRAL VALVE REPAIR (MVR);  Surgeon: Rexene Alberts, MD;  Location: [place];  Service: Open Heart Surgery;  Laterality: Right;  . Tee without cardioversion N/A 10/15/2015    Procedure: TRANSESOPHAGEAL ECHOCARDIOGRAM (TEE);  Surgeon: Rexene Alberts, MD;  Location: [place];  Service: Open Heart Surgery;  Laterality: N/A;    reports that she has never smoked. She has never used smokeless tobacco. She reports that she drinks about 2.4 oz of alcohol per week. She reports that she does not use illicit drugs. family history includes Cancer in her mother; Heart disease in her mother; Hypertension in her father; Non-Hodgkin's lymphoma in her mother. There is no history of Colon cancer or Stomach cancer. No Known Allergies      Review of Systems  Constitutional: Negative for fever, chills and appetite change.  Gastrointestinal: Negative for nausea, vomiting, abdominal pain, diarrhea and constipation.  Genitourinary: Positive for dysuria, frequency and hematuria.  Musculoskeletal: Negative for back pain.  Neurological: Negative for dizziness.       Objective:   Physical Exam  Constitutional: She appears well-developed and well-nourished.  Cardiovascular: Normal rate and regular rhythm.   Pulmonary/Chest: Effort normal and breath sounds normal. No respiratory distress. She has no wheezes.  She has no rales.          Assessment & Plan:  Dysuria.  Rule out UTI.  Recent cardiac surgery and foley cath as above.  Urine culture sent.  Stay well hydrated.  Start Keflex 500 mg po tid pending culture results.    Eulas Post MD [facility]

## 2015-10-30 ENCOUNTER — Other Ambulatory Visit: Payer: Self-pay | Admitting: Thoracic Surgery (Cardiothoracic Vascular Surgery)

## 2015-10-30 DIAGNOSIS — Z9889 Other specified postprocedural states: Secondary | ICD-10-CM

## 2015-10-30 DIAGNOSIS — R319 Hematuria, unspecified: Secondary | ICD-10-CM | POA: Diagnosis not present

## 2015-10-30 NOTE — Addendum Note (Signed)
Addended by: Gari Crown D on: 10/30/2015 05:02 PM   Modules accepted: Orders

## 2015-10-31 ENCOUNTER — Ambulatory Visit (INDEPENDENT_AMBULATORY_CARE_PROVIDER_SITE_OTHER): Payer: BLUE CROSS/BLUE SHIELD | Admitting: *Deleted

## 2015-10-31 DIAGNOSIS — Z9889 Other specified postprocedural states: Secondary | ICD-10-CM | POA: Diagnosis not present

## 2015-10-31 LAB — POCT INR: INR: 2.3

## 2015-11-02 LAB — URINE CULTURE

## 2015-11-03 ENCOUNTER — Ambulatory Visit
Admission: RE | Admit: 2015-11-03 | Discharge: 2015-11-03 | Disposition: A | Discharge status: Home / Self Care | Payer: BLUE CROSS/BLUE SHIELD | Source: Ambulatory Visit | Attending: Thoracic Surgery (Cardiothoracic Vascular Surgery) | Admitting: Thoracic Surgery (Cardiothoracic Vascular Surgery)

## 2015-11-03 ENCOUNTER — Ambulatory Visit (INDEPENDENT_AMBULATORY_CARE_PROVIDER_SITE_OTHER): Payer: Self-pay | Admitting: Thoracic Surgery (Cardiothoracic Vascular Surgery)

## 2015-11-03 ENCOUNTER — Other Ambulatory Visit: Payer: Self-pay | Admitting: *Deleted

## 2015-11-03 ENCOUNTER — Encounter: Payer: Self-pay | Admitting: Thoracic Surgery (Cardiothoracic Vascular Surgery)

## 2015-11-03 VITALS — BP 112/75 | HR 96 | Resp 20 | Ht 69.0 in | Wt 131.0 lb

## 2015-11-03 DIAGNOSIS — I341 Nonrheumatic mitral (valve) prolapse: Secondary | ICD-10-CM

## 2015-11-03 DIAGNOSIS — Z9889 Other specified postprocedural states: Secondary | ICD-10-CM

## 2015-11-03 DIAGNOSIS — J90 Pleural effusion, not elsewhere classified: Secondary | ICD-10-CM

## 2015-11-03 DIAGNOSIS — J948 Other specified pleural conditions: Secondary | ICD-10-CM

## 2015-11-03 DIAGNOSIS — I340 Nonrheumatic mitral (valve) insufficiency: Secondary | ICD-10-CM

## 2015-11-03 HISTORY — DX: Pleural effusion, not elsewhere classified: J90

## 2015-11-03 MED ORDER — FUROSEMIDE 20 MG PO TABS: 20.0000 mg | ORAL_TABLET | Freq: Every day | ORAL | Status: DC

## 2015-11-03 NOTE — Progress Notes (Signed)
[address]       [place] [address]             [phone]     CARDIOTHORACIC SURGERY OFFICE NOTE  Referring Provider is Larey Dresser, MD PCP is Joycelyn Man, MD   HPI:  Patient returns to the office today for routine follow-up status post minimally invasive mitral valve repair on 01/15/2016 for severe symptomatic primary mitral regurgitation.  The patient's early postoperative recovery was uneventful and she was discharged home on the sixth postoperative day.  Since hospital discharge she has had her prothrombin time checked and Coumadin dose adjusted through the Coumadin clinic. Last week patient developed sudden onset dysuria, urgency, and hematuria without fevers or chills. She was seen by her primary care physician and diagnosed with possible urinary tract infection. She was treated with oral Keflex and subsequent urine culture grew > 100,000 col/mL Escherichia coli that was sensitive to all antibiotics it was tested for.  Her symptoms improved rapidly. She returns to our office today for routine follow-up. She states that overall she feels fairly well. She still gets short of breath with exertion but has been making slow progress. She also seems to get more short of breath when she tries to lay on her side while sleeping at night. She has minimal to no discomfort in her chest but she has some pain in her upper back which she states was present prior to surgery. She has not had fevers or chills. Appetite is good. No other complaints.   Current Outpatient Prescriptions  Medication Sig Dispense Refill  . acyclovir (ZOVIRAX) 200 MG capsule TAKE 3 CAPSULES BY MOUTH IN THE MORNING & 2 CAPSULES IN THE EVENING UNTIL WELL, THEN 1 CAPSULE DAILY (Patient taking differently: 1 CAPSULE (200 mg) DAILY as needed for outbreaks) 90 capsule 3  . aspirin 81 MG tablet Take 81 mg by mouth daily. Reported on 10/08/2015    . calcium carbonate (OS-CAL) 600 MG TABS Take 600 mg  by mouth daily with breakfast.     . cephALEXin (KEFLEX) 500 MG capsule Take 1 capsule (500 mg total) by mouth 3 (three) times daily. 21 capsule 0  . conjugated estrogens (PREMARIN) vaginal cream Place vaginally 2 (two) times a week. (Patient taking differently: Place 1 Applicatorful vaginally daily as needed (for hormone supplementation). ) 42.5 g 11  . IRON PO Take by mouth. 65 mg dose    . loratadine (CLARITIN) 10 MG tablet Take 10 mg by mouth daily.    . metoprolol succinate (TOPROL-XL) 25 MG 24 hr tablet TAKE 1 TABLET (25 MG TOTAL) BY MOUTH DAILY. 90 tablet 1  . Multiple Vitamins-Minerals (MULTIVITAL PO) Take 1 tablet by mouth daily.      Marland Kitchen oxyCODONE (OXY IR/ROXICODONE) 5 MG immediate release tablet Take 1-2 tablets (5-10 mg total) by mouth every 6 (six) hours as needed for severe pain. 40 tablet 0  . warfarin (COUMADIN) 5 MG tablet Take 1 tablet (5 mg total) by mouth daily at 6 PM. And as prescribed by the coumadin clinic (Patient taking differently: Take 7.5 mg by mouth daily at 6 PM. And as prescribed by the coumadin clinic) 100 tablet 1  . furosemide (LASIX) 20 MG tablet Take 1 tablet (20 mg total) by mouth daily. 30 tablet 1   No current facility-administered medications for this visit.   Facility-Administered Medications Ordered in Other Visits  Medication Dose Route Frequency Provider Last Rate Last Dose  .  glutaraldehyde 0.625% soaking solution   Topical PRN Rexene Alberts, MD          Physical Exam:   BP 112/75 mmHg  Pulse 96  Resp 20  Ht 5\' 9"  (1.753 m)  Wt 131 lb (59.421 kg)  BMI 19.34 kg/m2  SpO2 97%  General:  Well-appearing  Chest:   Diminished breath sounds on right side  CV:   Regular rate and rhythm without murmur  Incisions:  Clean and dry healing nicely  Abdomen:  Soft nontender  Extremities:  Warm and well perfused with trace lower extremity edema  Diagnostic Tests:  CHEST 2 VIEW  COMPARISON: 5/ 9/17  FINDINGS: Convex left thoracolumbar spine  curvature. Midline trachea. Mild cardiomegaly with left atrial enlargement. Mitral valve repair. Increase in moderate right pleural effusion. Resolved right apical pneumothorax with apical pleural fluid. Left apical pleural thickening. No congestive failure. Worsened right mid and lower lung airspace disease. Clear left lung. Patchy right upper lobe airspace disease is likely atelectasis.  IMPRESSION: Increase in moderate right pleural effusion with adjacent Airspace disease, likely atelectasis.  Resolved tiny right apical pneumothorax.   Electronically Signed  By: Abigail Miyamoto M.D.  On: 11/03/2015 15:20    Impression:  Patient is doing fairly well proximally 2 weeks status post minimally invasive mitral valve repair. However, she has developed a moderate to large size right pleural effusion.   Plan:  We will plan therapeutic thoracenteses using ultrasound guidance as soon as practical. I have given the patient a prescription for Lasix 20 mg daily to begin tomorrow. We will plan to have her return for routine follow-up and repeat chest x-ray in 2 weeks. The patient has been encouraged to continue to gradually increase her physical activity as tolerated. All of her questions have been addressed.    Valentina Gu. Roxy Manns, MD 11/03/2015 4:17 PM

## 2015-11-03 NOTE — Patient Instructions (Signed)
You may continue to gradually increase your physical activity as tolerated.  Refrain from any heavy lifting or strenuous use of your arms and shoulders until at least 8 weeks from the time of your surgery, and avoid activities that cause increased pain in your chest on the side of your surgical incision.  Otherwise you may continue to increase activities without any particular limitations.  Increase the intensity and duration of physical activity gradually.  Begin taking lasix every morning and record your weight daily

## 2015-11-04 ENCOUNTER — Ambulatory Visit (HOSPITAL_COMMUNITY)
Admission: RE | Admit: 2015-11-04 | Discharge: 2015-11-04 | Disposition: A | Discharge status: Home / Self Care | Payer: BLUE CROSS/BLUE SHIELD | Source: Ambulatory Visit | Attending: Thoracic Surgery (Cardiothoracic Vascular Surgery) | Admitting: Thoracic Surgery (Cardiothoracic Vascular Surgery)

## 2015-11-04 ENCOUNTER — Ambulatory Visit (HOSPITAL_COMMUNITY)
Admission: RE | Admit: 2015-11-04 | Discharge: 2015-11-04 | Disposition: A | Discharge status: Home / Self Care | Payer: BLUE CROSS/BLUE SHIELD | Source: Ambulatory Visit | Attending: General Surgery | Admitting: General Surgery

## 2015-11-04 DIAGNOSIS — Z9889 Other specified postprocedural states: Secondary | ICD-10-CM

## 2015-11-04 DIAGNOSIS — Z952 Presence of prosthetic heart valve: Secondary | ICD-10-CM | POA: Insufficient documentation

## 2015-11-04 DIAGNOSIS — J90 Pleural effusion, not elsewhere classified: Secondary | ICD-10-CM

## 2015-11-04 DIAGNOSIS — J948 Other specified pleural conditions: Secondary | ICD-10-CM | POA: Insufficient documentation

## 2015-11-04 LAB — GRAM STAIN

## 2015-11-04 NOTE — Procedures (Signed)
Ultrasound-guided diagnostic and therapeutic right thoracentesis performed yielding 1.1 liters of serosanguineous colored fluid. No immediate complications. Follow-up chest x-ray pending.       [name] E 11:43 AM 11/04/2015

## 2015-11-04 NOTE — Progress Notes (Signed)
Cardiology Office Note    Date:  11/06/2015   ID:  Cristina Garcia, DOB 1960/08/06, MRN [id number]  PCP:  Cristina Man, MD  Cardiologist:  Dr. Claudie Leach hosp f/u- min invasive MV repiar  History of Present Illness:  Cristina Garcia is a 55 y.o. female with a history of MVP with severe MR s/p minimally invasive mitral valve repair on 10/15/2015, multiple myeloma in remission, atrial tach and CKD 2/2 MM who presents to clinic for post hospital follow up after recent admission for minimally invasive mitral valve repair.   Patient has been followed by Dr. Aundra Dubin. She has had a long history of murmur and has been told for years that she has mitral valve prolapse. No history of this in her family though her mother had rheumatic fever and a mitral valve operation. Her PCP sent her for an echo in 1/15, which showed bileaflet prolapse and probably moderate MR. TEE was recommended. This was done in 3/15 and showed moderate to severe MR from bileaflet mitral valve prolapse with normal LV size and no pulmonary vein systolic doppler flow reversal. She has repeat echo in 6/16. It continued to show moderate to severe mid to late systolic mitral regurgitation in the setting of MVP. EF 60%, mild LV dilation, moderate LAE.   She developed palpitations, with monitoring showing frequent PACs and short runs of atrial tachycardia. She was started on Toprol XL at low dose, which has helped control the palpitations.   She was seen in the office by Dr. Aundra Dubin in 07/2105 and complained of decreased exercise tolerance and fatigue. Dr. Aundra Dubin felt her ECHO from 07/2015 showed EF 55-60% with mildly dilated LV and moderate left atrial enlargement as well as severe bileaflet prolapse with severe MR.Her set her up for TEE, L/RHC and referred to Dr. Roxy Manns for minimally invasive MV repair. Transesophageal echocardiogram was performed 08/18/2015. This confirmed the presence of bileaflet prolapse with severe  mitral regurgitation. There was systolic flow reversal in the pulmonary veins. Left ventricular size and systolic function remained normal with ejection fraction estimated 55%. Left and right heart catheterization was notable for the absence of coronary artery disease. Pulmonary artery pressures were normal although there were large V waves seen on pulmonary capillary wedge tracing. She ultimately underwent successful minimally invasive mitral valve repair on 10/15/15. She was started on coumadin and discharged in good condition.   She was seen by Dr. Roxy Manns on 11/03/15 for post op follow up. She was noted to have a mod-large pleural effusion. She was started on lasix 60m daily and underwent successful ultrasound guided right thoracentesis yielding 1.1 L of pleural fluid on 11/04/15.  Today she presents to clinic for follow up. Feeling better after doing the thoracocentesis. Can take a deeper breath now. Overall feeling better. She hasnt been sleeping well due to having to sleep on her back. The pain pills have been helping a little. No LE edema, orthopnea or PND. No dizziness or syncope. She thinks the Toprol XL makes her sleepy and eventually would like to get off this. No blood in her stool or urine. She did have some hematuria with a UTI recently which has resolved after Abx.    Past Medical History  Diagnosis Date  . Allergy   . H/O multiple myeloma     clinical remission  . Mitral valve prolapse   . Hypertension   . Osteoporosis   . Constipation   . Mitral regurgitation   . Atrial  tachycardia ([place]) 12/18/2014    24 hour event monitor with frequent PAC's, PVC's and short runs of atrial tachycardia - no sustained arrhythmias or atrial fibrillation   . Anxiety   . Chronic kidney disease (CKD)   . S/P minimally invasive mitral valve repair 10/15/2015    Complex valvuloplasty including Alfieri edge-to-edge repair, generous quadrangular resection (shortening) of posterior leaflet, sliding leaflet  plasty, Gore-tex neochord placement x6, and 38 mm Edwards Physio II ring annuloplasty via right mini thoracotomy approach   . Pleural effusion, right 11/03/2015    Past Surgical History  Procedure Laterality Date  . Breast enhancement surgery  2000  . Oophorectomy  1991    left  . Limbal stem cell transplant  2008  . Cesarean section  1992  . Wisdom tooth extraction  1975  . Tee without cardioversion N/A 09/05/2013    Procedure: TRANSESOPHAGEAL ECHOCARDIOGRAM (TEE);  Surgeon: Larey Dresser, MD;  Location: [facility];  Service: Cardiovascular;  Laterality: N/A;  . Cardiac catheterization N/A 08/18/2015    Procedure: Right/Left Heart Cath and Coronary Angiography;  Surgeon: Larey Dresser, MD;  Location: [facility];  Service: Cardiovascular;  Laterality: N/A;  . Tee without cardioversion N/A 08/18/2015    Procedure: TRANSESOPHAGEAL ECHOCARDIOGRAM (TEE);  Surgeon: Larey Dresser, MD;  Location: [facility];  Service: Cardiovascular;  Laterality: N/A;  . Mitral valve repair Right 10/15/2015    Procedure: MINIMALLY INVASIVE MITRAL VALVE REPAIR (MVR);  Surgeon: Rexene Alberts, MD;  Location: [facility];  Service: Open Heart Surgery;  Laterality: Right;  . Tee without cardioversion N/A 10/15/2015    Procedure: TRANSESOPHAGEAL ECHOCARDIOGRAM (TEE);  Surgeon: Rexene Alberts, MD;  Location: [facility];  Service: Open Heart Surgery;  Laterality: N/A;    Current Medications: Outpatient Prescriptions Prior to Visit  Medication Sig Dispense Refill  . aspirin 81 MG tablet Take 81 mg by mouth daily. Reported on 10/08/2015    . calcium carbonate (OS-CAL) 600 MG TABS Take 600 mg by mouth daily with breakfast.     . cephALEXin (KEFLEX) 500 MG capsule Take 1 capsule (500 mg total) by mouth 3 (three) times daily. 21 capsule 0  . furosemide (LASIX) 20 MG tablet Take 1 tablet (20 mg total) by mouth daily. 30 tablet 1  . IRON PO Take by mouth. 65 mg dose    . loratadine (CLARITIN) 10 MG tablet Take 10 mg by  mouth daily.    . metoprolol succinate (TOPROL-XL) 25 MG 24 hr tablet TAKE 1 TABLET (25 MG TOTAL) BY MOUTH DAILY. 90 tablet 1  . Multiple Vitamins-Minerals (MULTIVITAL PO) Take 1 tablet by mouth daily.      Marland Kitchen oxyCODONE (OXY IR/ROXICODONE) 5 MG immediate release tablet Take 1-2 tablets (5-10 mg total) by mouth every 6 (six) hours as needed for severe pain. 40 tablet 0  . warfarin (COUMADIN) 5 MG tablet Take 1 tablet (5 mg total) by mouth daily at 6 PM. And as prescribed by the coumadin clinic (Patient taking differently: Take 7.5 mg by mouth daily at 6 PM. And as prescribed by the coumadin clinic) 100 tablet 1  . acyclovir (ZOVIRAX) 200 MG capsule TAKE 3 CAPSULES BY MOUTH IN THE MORNING & 2 CAPSULES IN THE EVENING UNTIL WELL, THEN 1 CAPSULE DAILY (Patient not taking: Reported on 11/06/2015) 90 capsule 3  . conjugated estrogens (PREMARIN) vaginal cream Place vaginally 2 (two) times a week. (Patient not taking: Reported on 11/06/2015) 42.5 g 11   Facility-Administered Medications Prior  to Visit  Medication Dose Route Frequency Provider Last Rate Last Dose  . glutaraldehyde 0.625% soaking solution   Topical PRN Rexene Alberts, MD         Allergies:   Review of patient's allergies indicates no known allergies.   Social History   Social History  . Marital Status: Married    Spouse Name: N/A  . Number of Children: N/A  . Years of Education: N/A   Social History Main Topics  . Smoking status: Never Smoker   . Smokeless tobacco: Never Used  . Alcohol Use: 2.4 oz/week    4 Glasses of wine per week  . Drug Use: No  . Sexual Activity: Not Asked   Other Topics Concern  . None   Social History Narrative     Family History:  The patient's family history includes Cancer in her mother; Congestive Heart Failure in her mother; Hypertension in her father; Non-Hodgkin's lymphoma in her mother; Rheumatic fever in her mother. There is no history of Colon cancer or Stomach cancer.   ROS:   Please see  the history of present illness.    ROS All other systems reviewed and are negative.   PHYSICAL EXAM:   VS:  BP 102/64 mmHg  Pulse 97  Ht '5\' 9"'  (1.753 m)  Wt 130 lb (58.968 kg)  BMI 19.19 kg/m2   GEN: Well nourished, well developed, in no acute distress HEENT: normal Neck: no JVD, carotid bruits, or masses Cardiac: RRR; no murmurs, rubs, or gallops,no edema  Respiratory:  clear to auscultation bilaterally, normal work of breathing GI: soft, nontender, nondistended, + BS MS: no deformity or atrophy Skin: warm and dry, no rash Neuro:  Alert and Oriented x 3, Strength and sensation are intact Psych: euthymic mood, full affect  Wt Readings from Last 3 Encounters:  11/06/15 130 lb (58.968 kg)  11/03/15 131 lb (59.421 kg)  10/29/15 131 lb (59.421 kg)      Studies/Labs Reviewed:   EKG:  EKG is ordered today.  The ekg ordered today demonstrates sinus with PVC, notched P Waves, HR 97  Recent Labs: 12/11/2014: Pro B Natriuretic peptide (BNP) 92.0 10/10/2015: ALT 19 10/16/2015: Magnesium 2.8* 10/21/2015: BUN 14; Creatinine, Ser 1.13*; Hemoglobin 8.0*; Platelets 101*; Potassium 3.8; Sodium 133*   Lipid Panel    Component Value Date/Time   CHOL 245* 06/18/2014 0856   TRIG 69.0 06/18/2014 0856   HDL 90.40 06/18/2014 0856   CHOLHDL 3 06/18/2014 0856   VLDL 13.8 06/18/2014 0856   LDLCALC 141* 06/18/2014 0856   LDLDIRECT 104.4 08/13/2010 0835    Additional studies/ records that were reviewed today include:  08/18/15 Procedures    Right/Left Heart Cath and Coronary Angiography    Conclusion    1. Normal left and right heart filling pressures with prominent v-waves noted on PCWP pressure tracing.  2. No angiographic coronary artery disease.  3. 3+ mitral regurgitation noted.  Will refer for evaluation for minimally invasive mitral valve repair.     2D ECHO: 07/24/2015 LV EF: 60% - 65% Study Conclusions - Left ventricle: The cavity size was normal. Systolic function was   normal. The estimated ejection fraction was in the range of 60%  to 65%. Wall motion was normal; there were no regional wall  motion abnormalities. Features are consistent with a pseudonormal  left ventricular filling pattern, with concomitant abnormal  relaxation and increased filling pressure (grade 2 diastolic  dysfunction). Doppler parameters are consistent with elevated  ventricular end-diastolic  filling pressure. - Aortic valve: Trileaflet; mildly thickened leaflets. There was no  regurgitation. - Aortic root: The aortic root was normal in size. - Mitral valve: Mitral valve is myxomatous with severe thickening  and severe bileaflet prolapse. There was severe regurgitation. - Left atrium: The atrium was mildly dilated. - Right ventricle: The cavity size was normal. Wall thickness was  normal. Systolic function was normal. - Tricuspid valve: There was trivial regurgitation. - Pulmonary arteries: Systolic pressure was within the normal  range. - Inferior vena cava: The vessel was normal in size. - Pericardium, extracardiac: There was no pericardial effusion.    ASSESSMENT:    1. Mitral valvular regurgitation   2. Palpitation   3. Pleural effusion      PLAN:  In order of problems listed above:  1. Severe symptomatic mitral regurgitation: s/p minimally invasive mitral valve repair with Dr. Roxy Manns on 10/15/15. Continue coumadin. INR check today. Will defer to Dr Roxy Manns on how long she will stay on Coumadin.  2. Palpitations: known to have PACs and atrial tachycardia. 30 day monitor in 8/16 did not show atrial fibrillation. Continue Toprol XL. She would eventually like to come off this but we talked about how is probably important to stay on it for now all she recovers from surgery.  3. Pleural effusion: s/p successful ultrasound guided right thoracentesis yielding 1.1 L of pleural fluid on 11/04/15. Continue lasix 37m daily.  Fu CXR in 2 weeks per Dr. ORoxy Manns  Medication  Adjustments/Labs and Tests Ordered: Current medicines are reviewed at length with the patient today.  Concerns regarding medicines are outlined above.  Medication changes, Labs and Tests ordered today are listed in the Patient Instructions below. Patient Instructions  Continue same medications  Keep appointment with Dr.McLean     Signed, KAngelena Form PA-C  11/06/2015 12:21 PM    [facility] [address] [place]  [id number]: [phone] Fax: [phone]

## 2015-11-05 ENCOUNTER — Other Ambulatory Visit: Payer: Self-pay | Admitting: *Deleted

## 2015-11-05 DIAGNOSIS — G8918 Other acute postprocedural pain: Secondary | ICD-10-CM

## 2015-11-05 MED ORDER — OXYCODONE HCL 5 MG PO TABS: 5.0000 mg | ORAL_TABLET | Freq: Four times a day (QID) | ORAL | Status: DC | PRN

## 2015-11-05 NOTE — Telephone Encounter (Signed)
Cristina Garcia called to request a refill for Oxycodone s/p MINI MVR 10/15/15. I informed her that a new signed script would be available today at our office and she agreed.

## 2015-11-06 ENCOUNTER — Ambulatory Visit (INDEPENDENT_AMBULATORY_CARE_PROVIDER_SITE_OTHER): Payer: BLUE CROSS/BLUE SHIELD | Admitting: Physician Assistant

## 2015-11-06 ENCOUNTER — Ambulatory Visit (INDEPENDENT_AMBULATORY_CARE_PROVIDER_SITE_OTHER): Payer: BLUE CROSS/BLUE SHIELD | Admitting: *Deleted

## 2015-11-06 ENCOUNTER — Encounter: Payer: Self-pay | Admitting: Physician Assistant

## 2015-11-06 VITALS — BP 102/64 | HR 97 | Ht 69.0 in | Wt 130.0 lb

## 2015-11-06 DIAGNOSIS — Z9889 Other specified postprocedural states: Secondary | ICD-10-CM

## 2015-11-06 DIAGNOSIS — R002 Palpitations: Secondary | ICD-10-CM | POA: Diagnosis not present

## 2015-11-06 DIAGNOSIS — J90 Pleural effusion, not elsewhere classified: Secondary | ICD-10-CM | POA: Diagnosis not present

## 2015-11-06 DIAGNOSIS — I340 Nonrheumatic mitral (valve) insufficiency: Secondary | ICD-10-CM

## 2015-11-06 LAB — POCT INR: INR: 2.3

## 2015-11-06 NOTE — Patient Instructions (Signed)
Continue same medications  Keep appointment with Dr.McLean

## 2015-11-09 LAB — CULTURE, BODY FLUID W GRAM STAIN -BOTTLE

## 2015-11-09 LAB — CULTURE, BODY FLUID-BOTTLE: CULTURE: NO GROWTH

## 2015-11-13 ENCOUNTER — Ambulatory Visit (INDEPENDENT_AMBULATORY_CARE_PROVIDER_SITE_OTHER): Payer: BLUE CROSS/BLUE SHIELD | Admitting: *Deleted

## 2015-11-13 ENCOUNTER — Other Ambulatory Visit: Payer: Self-pay | Admitting: Thoracic Surgery (Cardiothoracic Vascular Surgery)

## 2015-11-13 DIAGNOSIS — Z9889 Other specified postprocedural states: Secondary | ICD-10-CM

## 2015-11-13 LAB — POCT INR: INR: 3.4

## 2015-11-17 ENCOUNTER — Encounter: Payer: Self-pay | Admitting: Thoracic Surgery (Cardiothoracic Vascular Surgery)

## 2015-11-17 ENCOUNTER — Other Ambulatory Visit: Payer: Self-pay | Admitting: Thoracic Surgery (Cardiothoracic Vascular Surgery)

## 2015-11-17 ENCOUNTER — Ambulatory Visit
Admission: RE | Admit: 2015-11-17 | Discharge: 2015-11-17 | Disposition: A | Discharge status: Home / Self Care | Payer: BLUE CROSS/BLUE SHIELD | Source: Ambulatory Visit | Attending: Thoracic Surgery (Cardiothoracic Vascular Surgery) | Admitting: Thoracic Surgery (Cardiothoracic Vascular Surgery)

## 2015-11-17 DIAGNOSIS — J90 Pleural effusion, not elsewhere classified: Secondary | ICD-10-CM | POA: Diagnosis not present

## 2015-11-17 DIAGNOSIS — Z9889 Other specified postprocedural states: Secondary | ICD-10-CM

## 2015-11-18 ENCOUNTER — Encounter: Payer: Self-pay | Admitting: Thoracic Surgery (Cardiothoracic Vascular Surgery)

## 2015-11-18 ENCOUNTER — Ambulatory Visit (INDEPENDENT_AMBULATORY_CARE_PROVIDER_SITE_OTHER): Payer: Self-pay | Admitting: Thoracic Surgery (Cardiothoracic Vascular Surgery)

## 2015-11-18 VITALS — BP 135/94 | HR 110 | Resp 16 | Ht 69.0 in

## 2015-11-18 DIAGNOSIS — I341 Nonrheumatic mitral (valve) prolapse: Secondary | ICD-10-CM

## 2015-11-18 DIAGNOSIS — I340 Nonrheumatic mitral (valve) insufficiency: Secondary | ICD-10-CM

## 2015-11-18 DIAGNOSIS — Z9889 Other specified postprocedural states: Secondary | ICD-10-CM

## 2015-11-18 NOTE — Patient Instructions (Addendum)
Continue all previous medications without any changes at this time  You are encouraged to enroll and participate in the outpatient cardiac rehab program beginning as soon as practical.  You may continue to gradually increase your physical activity as tolerated.  Refrain from any heavy lifting or strenuous use of your arms and shoulders until at least 8 weeks from the time of your surgery, and avoid activities that cause increased pain in your chest on the side of your surgical incision.  Otherwise you may continue to increase activities without any particular limitations.  Increase the intensity and duration of physical activity gradually.  You may return to driving an automobile as long as you are no longer requiring oral narcotic pain relievers during the daytime.  It would be wise to start driving only short distances during the daylight and gradually increase from there as you feel comfortable.

## 2015-11-18 NOTE — Progress Notes (Signed)
[address]       [place] [address]             [phone]     CARDIOTHORACIC SURGERY OFFICE NOTE  Referring Provider is Larey Dresser, MD PCP is Joycelyn Man, MD   HPI:  Patient returns to the office today for follow-up status post minimally invasive mitral valve repair on 10/15/2015 for severe symptomatic primary mitral regurgitation.The patient's early postoperative recovery was uneventful and she was discharged home on the sixth postoperative day. She later developed a urinary tract infection which was treated with a course of oral antibiotics and resolved. She was last seen here in our office on 11/03/2015 at which time she was noted to have a moderate-sized right pleural effusion.  She underwent ultrasound-guided right thoracentesis on 11/04/2015.  Chest x-ray performed immediately following thoracentesis revealed a small residual pleural effusion, and the patient states that the thoracentesis was stopped prematurely when she developed some discomfort while the fluid was being aspirated.  She returns to our office for routine follow-up today. Overall she states that she continues to slowly improve.  She still feels as though her exercise tolerance has been slow to recover and she gets short of breath with activity. Some days she feels better than others. She has not had significant residual pain and at this point she has very mild residual soreness in the right lateral chest.  She denies any cough or fevers. Appetite is good. She is sleeping well at night. Her weight has been stable and her lower extremity edema has resolved.  She remains on Coumadin and her most recent INR was measured 3.4   Current Outpatient Prescriptions  Medication Sig Dispense Refill  . acyclovir (ZOVIRAX) 200 MG capsule Take 200 mg by mouth daily as needed.     Marland Kitchen aspirin 81 MG tablet Take 81 mg by mouth daily. Reported on 10/08/2015    . calcium carbonate (OS-CAL) 600 MG TABS Take  600 mg by mouth daily with breakfast.     . Estrogens, Conjugated (PREMARIN VA) Place vaginally daily as needed.     . furosemide (LASIX) 20 MG tablet Take 1 tablet (20 mg total) by mouth daily. 30 tablet 1  . IRON PO Take by mouth. 65 mg dose    . loratadine (CLARITIN) 10 MG tablet Take 10 mg by mouth daily.    . metoprolol succinate (TOPROL-XL) 25 MG 24 hr tablet TAKE 1 TABLET (25 MG TOTAL) BY MOUTH DAILY. 90 tablet 1  . Multiple Vitamins-Minerals (MULTIVITAL PO) Take 1 tablet by mouth daily.      Marland Kitchen warfarin (COUMADIN) 5 MG tablet Take 1 tablet (5 mg total) by mouth daily at 6 PM. And as prescribed by the coumadin clinic 100 tablet 1  . oxyCODONE (OXY IR/ROXICODONE) 5 MG immediate release tablet Take 1-2 tablets (5-10 mg total) by mouth every 6 (six) hours as needed for severe pain. (Patient not taking: Reported on 11/18/2015) 40 tablet 0   No current facility-administered medications for this visit.   Facility-Administered Medications Ordered in Other Visits  Medication Dose Route Frequency Provider Last Rate Last Dose  . glutaraldehyde 0.625% soaking solution   Topical PRN Rexene Alberts, MD          Physical Exam:   BP 135/94 mmHg  Pulse 110  Resp 16  Ht 5\' 9"  (1.753 m)  SpO2   General:  Well appearing  Chest:   Clear to auscultation  with slightly diminished breath sounds right base  CV:   Regular rate and rhythm without murmur  Incisions:  Healing nicely  Abdomen:  Soft and nontender  Extremities:  warm and well perfused, no lower extremity edema  Diagnostic Tests:  2 channel telemetry rhythm strip demonstrates sinus tachycardia with heart rate 100-110   CHEST 2 VIEW  COMPARISON: 11/04/2015  FINDINGS: Changes of mitral valve repair. Small right pleural effusion again noted, similar prior study. Right base atelectasis, minimally improved. No confluent opacity on the left. Heart is normal size. No acute bony abnormality. Biapical scarring.  IMPRESSION: Small  right pleural effusion with right base atelectasis, minimally improved since prior study.   Electronically Signed  By: Rolm Baptise M.D.  On: 11/17/2015 12:46   Impression:  Patient is doing reasonably well approximately 4 weeks status post minimally invasive mitral valve repair.  He has a small residual right pleural effusion which has not reaccumulated since she underwent thoracentesis on 11/04/2015. She may be on the dry side and it might be reasonable to stop Lasix at this time.     Plan:  I have suggested that the patient might stop taking Lasix and potassium. She should continue to monitor her weight closely and resume using Lasix as needed should she develop signs of fluid retention. I have encouraged the patient to continue to increase her activity as tolerated.She may resume driving an automobile. I have encouraged her to enroll and participate in the cardiac rehabilitation program.  I think it might be prudent to check a routine follow-up echocardiogram in the near future 2 follow-up her mitral valve repair and assess transvalvular gradient given the fact that she required Alfieri edge to edge repair. She is scheduled for routine appointment with Dr. Aundra Dubin on 12/05/2015.  We will ask that a follow-up echocardiogram be performed at that time.  She will return for routine follow-up and repeat chest x-ray in 6 weeks.    Valentina Gu. Roxy Manns, MD 11/18/2015 11:41 AM

## 2015-11-20 ENCOUNTER — Ambulatory Visit (INDEPENDENT_AMBULATORY_CARE_PROVIDER_SITE_OTHER): Payer: BLUE CROSS/BLUE SHIELD | Admitting: Surgery

## 2015-11-20 ENCOUNTER — Telehealth: Payer: Self-pay

## 2015-11-20 DIAGNOSIS — Z9889 Other specified postprocedural states: Secondary | ICD-10-CM | POA: Diagnosis not present

## 2015-11-20 LAB — POCT INR: INR: 2.8

## 2015-11-20 NOTE — Telephone Encounter (Signed)
-----   Message from Larey Dresser, MD sent at 11/20/2015 11:12 AM EDT ----- Can you arrange for Mrs Eastlack to get echo for mitral repair? Thanks.  ----- Message -----    From: Aris Georgia, [facility]    Sent: 11/20/2015  10:28 AM      To: Larey Dresser, MD, Rexene Alberts, MD  Pt was seen in Coumadin clinic today.  Mentioned Dr. Roxy Manns wanted an echocardiogram later this month with her Dr. Aundra Dubin appt.  I saw it in the [place] note but did not see an order placed.  Can you have the nurse place an order so that we can get this scheduled for her?  Thanks!

## 2015-11-20 NOTE — Telephone Encounter (Signed)
Echo ordered for scheduling. Patient agrees with treatment plan.

## 2015-11-21 ENCOUNTER — Encounter: Payer: Self-pay | Admitting: *Deleted

## 2015-11-21 ENCOUNTER — Other Ambulatory Visit: Payer: Self-pay | Admitting: *Deleted

## 2015-11-21 NOTE — Progress Notes (Signed)
Patient ID: Cristina Garcia, female   DOB: [birth year], 55 y.o.   MRN: [id number] A referral has been made to [facility] to contact Ms. Capetillo to begin Phase II per the request of Dr. Darylene Price.

## 2015-11-26 ENCOUNTER — Other Ambulatory Visit (HOSPITAL_COMMUNITY): Payer: BLUE CROSS/BLUE SHIELD

## 2015-12-05 ENCOUNTER — Ambulatory Visit (HOSPITAL_COMMUNITY)
Admission: RE | Admit: 2015-12-05 | Discharge: 2015-12-05 | Disposition: A | Discharge status: Home / Self Care | Payer: BLUE CROSS/BLUE SHIELD | Source: Ambulatory Visit | Attending: Cardiology | Admitting: Cardiology

## 2015-12-05 ENCOUNTER — Ambulatory Visit (INDEPENDENT_AMBULATORY_CARE_PROVIDER_SITE_OTHER): Payer: BLUE CROSS/BLUE SHIELD | Admitting: Cardiology

## 2015-12-05 ENCOUNTER — Encounter: Payer: Self-pay | Admitting: Cardiology

## 2015-12-05 ENCOUNTER — Ambulatory Visit (INDEPENDENT_AMBULATORY_CARE_PROVIDER_SITE_OTHER): Payer: BLUE CROSS/BLUE SHIELD | Admitting: *Deleted

## 2015-12-05 VITALS — BP 124/80 | HR 100 | Ht 69.0 in | Wt 130.0 lb

## 2015-12-05 DIAGNOSIS — I340 Nonrheumatic mitral (valve) insufficiency: Secondary | ICD-10-CM | POA: Diagnosis not present

## 2015-12-05 DIAGNOSIS — Z79899 Other long term (current) drug therapy: Secondary | ICD-10-CM | POA: Diagnosis not present

## 2015-12-05 DIAGNOSIS — I119 Hypertensive heart disease without heart failure: Secondary | ICD-10-CM | POA: Diagnosis not present

## 2015-12-05 DIAGNOSIS — R002 Palpitations: Secondary | ICD-10-CM

## 2015-12-05 DIAGNOSIS — Z9889 Other specified postprocedural states: Secondary | ICD-10-CM

## 2015-12-05 DIAGNOSIS — I471 Supraventricular tachycardia: Secondary | ICD-10-CM

## 2015-12-05 LAB — POCT INR: INR: 3.4

## 2015-12-05 LAB — ECHOCARDIOGRAM COMPLETE
Area-P 1/2: 3.24 cm2
CHL CUP DOP CALC LVOT VTI: 12.8 cm
CHL CUP TV REG PEAK VELOCITY: 286 cm/s
FS: 31 % (ref 28–44)
IV/PV OW: 0.83
LA ID, A-P, ES: 33 mm
LA diam end sys: 33 mm
LADIAMINDEX: 1.92 cm/m2
LAVOLA4C: 51.5 mL
LV dias vol: 79 mL (ref 46–106)
LV e' LATERAL: 6.08 cm/s
LVDIAVOLIN: 46 mL/m2
LVOT MV VTI INDEX: 0.48 cm2/m2
LVOT MV VTI: 0.82
LVOT SV: 36 mL
LVOT area: 2.84 cm2
LVOT peak vel: 86.7 cm/s
LVOTD: 19 mm
MV M vel: 122
MVANNULUSVTI: 44.5 cm
MVSPHT: 68 ms
Mean grad: 8 mmHg
PW: 9.99 mm — AB (ref 0.6–1.1)
TAPSE: 11.8 mm
TDI e' lateral: 6.08
TRMAXVEL: 286 cm/s

## 2015-12-05 MED ORDER — LISINOPRIL 5 MG PO TABS: 5.0000 mg | ORAL_TABLET | Freq: Every day | ORAL | Status: DC

## 2015-12-05 NOTE — Patient Instructions (Signed)
Medication Instructions:  Your physician has recommended you make the following change in your medication: 1) START Lisinopril 5 mg daily  Labwork: Your physician recommends that you return for lab work in: 10 days for a BMET  Testing/Procedures: None ordered  Follow-Up: Your physician recommends that you schedule a follow-up appointment in: 6 weeks with Dr. Aundra Dubin.  If you need a refill on your cardiac medications before your next appointment, please call your pharmacy.  Thank you for choosing [facility]!!

## 2015-12-05 NOTE — Progress Notes (Signed)
  Echocardiogram 2D Echocardiogram has been performed.  Cristina Garcia 12/05/2015, 3:08 PM

## 2015-12-07 NOTE — Progress Notes (Signed)
Patient ID: Cristina Garcia, female   DOB: 1960/08/15, 55 y.o.   MRN: [id number] PCP: Dr. Sherren Mocha  55 yo with mitral valve prolapse and mitral regurgitation presents for cardiology evaluation.  Patient has had a long history of murmur and has been told for years that she has mitral valve prolapse.  No history of this in her family though her mother had rheumatic fever and a mitral valve operation.  Her PCP sent her for an echo in 1/15, which showed bileaflet prolapse and probably moderate MR.  TEE was recommended.  This was done in 3/15 and showed moderate to severe MR from bileaflet mitral valve prolapse with normal LV size and no pulmonary vein systolic doppler flow reversal.  She had repeat echo in 6/16.  It continued to show moderate to severe mid to late systolic mitral regurgitation in the setting of MVP.  EF 60%, mild LV dilation, moderate LAE.  Echo in 2/17 showed severe MR due to MVP.    Last winter, she began to develop exertional dyspnea.  I was concerned that this came from her mitral valve.  TEE confirmed severe MR from bileaflet prolapse. LHC showed no significant CAD.  In 5/17, she had a complex minimally invasive mitral valve repair by Dr Roxy Manns.  She had Alfieri edge-to-edge repair and annuloplasty.  Post-operatively, she required a right thoracentesis.  Echo in 6/17 (post-op) showed EF 45%, mild LV dilation, stable repaired mitral valve with no MR or MS.  PASP 48 mmHg.   She returns for followup today.  She is gradually increasing her activity.  She remains short of breath carrying a load up a flight of stairs.  She tries to walk twice a day for 15 minutes at a time.  No chest pain, lightheadedness, palpitations, orthopnea/PND.    Labs (2/15): creatinine 1.18 Labs (10/16): K 4, creatinine 1.3 Labs (5/17): K 3.8, creatinine 1.13, plts 101, hgb 8  ECG: NSR, biatrial enlargement.   PMH: 1. Mitral valve prolapse: Echo (1/15) with EF 55-60%, grade II diastolic dysfunction, bileaflet MV  prolapse, moderate MR.  TEE (3/15) with EF 60%, normal RV size and systolic function, bileaflet MV prolapse, moderate to severe MR (moderate by PISA), no pulmonary vein systolic flow reversal.  Echo (6/16) with EF 60%, MVP with moderate-severe mid to late systolic MR, mild LV dilation (53 mm EDD), grade II diastolic dysfunction, moderate LAE. Echo (2/17) with EF 55-60%, mild LV dilation (56 mm EDD), grade II diastolic dysfunction, moderate LAE, bileaflet MVP with severe MR, normal RV size and systolic function, unable to estimate PA pressure.  - Echo (6/17) post-op with EF 45%, mild LV dilation, s/p MV repair with no significant regurgitation or stenosis, PASP 48 mmHg.  - S/p mitral valve repair in 5/17.   2. Multiple myeloma: s/p chemotherapy and autologous stem cell transplant at [facility] in 6/08, clinical remission.  3. CKD 2/2 multiple myeloma 4. H/o rectal bleeding with negative colonoscopy in 3/13.  5. Osteopenia 6. Atrial tachycardia: 30 day monitor in 8/16 with atrial tachycardia, no atrial fibrillation.  7. ETT (8/16) with 8'56" exercise, no ischemia, frequent PACs/PVCs.  LHC (3/17) showed no CAD.    SH: Married, nonsmoker, works for [employer].  2 kids.   FH: HTN, lymphoma, diabetes. Mother with rheumatic fever and had MV replacement.   ROS: All systems reviewed and negative except as per HPI.   Current Outpatient Prescriptions  Medication Sig Dispense Refill  . aspirin 81 MG tablet Take 81 mg  by mouth daily. Reported on 10/08/2015    . calcium carbonate (OS-CAL) 600 MG TABS Take 600 mg by mouth daily with breakfast.     . Estrogens, Conjugated (PREMARIN VA) Place vaginally daily as needed.     . furosemide (LASIX) 20 MG tablet Take 1 tablet (20 mg total) by mouth daily. 30 tablet 1  . IRON PO Take by mouth. 65 mg dose    . loratadine (CLARITIN) 10 MG tablet Take 10 mg by mouth daily.    . metoprolol succinate (TOPROL-XL) 25 MG 24 hr tablet TAKE 1 TABLET (25 MG TOTAL) BY  MOUTH DAILY. 90 tablet 1  . Multiple Vitamins-Minerals (MULTIVITAL PO) Take 1 tablet by mouth daily.      Marland Kitchen oxyCODONE (OXY IR/ROXICODONE) 5 MG immediate release tablet Take 1-2 tablets (5-10 mg total) by mouth every 6 (six) hours as needed for severe pain. 40 tablet 0  . warfarin (COUMADIN) 5 MG tablet Take 1 tablet (5 mg total) by mouth daily at 6 PM. And as prescribed by the coumadin clinic 100 tablet 1  . acyclovir (ZOVIRAX) 200 MG capsule Take 200 mg by mouth daily as needed. Reported on 12/05/2015    . lisinopril (PRINIVIL,ZESTRIL) 5 MG tablet Take 1 tablet (5 mg total) by mouth daily. 30 tablet 6   No current facility-administered medications for this visit.   Facility-Administered Medications Ordered in Other Visits  Medication Dose Route Frequency Provider Last Rate Last Dose  . glutaraldehyde 0.625% soaking solution   Topical PRN Rexene Alberts, MD        BP 124/80 mmHg  Pulse 100  Ht '5\' 9"'  (1.753 m)  Wt 130 lb (58.968 kg)  BMI 19.19 kg/m2  SpO2 99% General: NAD Neck: No JVD, no thyromegaly or thyroid nodule.  Lungs: Mildly decreased breath sounds right base.  CV: Nondisplaced PMI.  Heart regular S1/S2, no S3/S4, no murmur.  No peripheral edema.  No carotid bruit.  Normal pedal pulses.  Abdomen: Soft, nontender, no hepatosplenomegaly, no distention.  Skin: Intact without lesions or rashes.  Neurologic: Alert and oriented x 3.  Psych: Normal affect. Extremities: No clubbing or cyanosis.   Assessment/Plan: 1. Mitral regurgitation: Now s/p mitral valve repair.  Post-op echo shows no significant regurgitation or stenosis, but EF is down to 45%.  I suspect EF before appeared artificially high due to the severe MR.   - She is on short-term warfarin, can stop when directed by Dr. Roxy Manns. - Start cardiac rehab.  2. Palpitations: Suspect due to PACs and atrial tachycardia. 30 day monitor in 8/16 did not show atrial fibrillation.  3. Cardiomyopathy: Mildly decreased EF, likely due to  mitral regurgitation.   - Continue Toprol XL. - Start lisinopril 5 mg daily with BMET in 10 days.   [name] 12/07/2015

## 2015-12-15 ENCOUNTER — Other Ambulatory Visit: Payer: BLUE CROSS/BLUE SHIELD

## 2015-12-15 NOTE — Addendum Note (Signed)
Addended by: Freada Bergeron on: 12/15/2015 05:06 PM   Modules accepted: Orders

## 2015-12-19 ENCOUNTER — Other Ambulatory Visit (INDEPENDENT_AMBULATORY_CARE_PROVIDER_SITE_OTHER): Payer: BLUE CROSS/BLUE SHIELD

## 2015-12-19 ENCOUNTER — Telehealth: Payer: Self-pay | Admitting: Cardiovascular Disease

## 2015-12-19 ENCOUNTER — Ambulatory Visit (INDEPENDENT_AMBULATORY_CARE_PROVIDER_SITE_OTHER): Payer: BLUE CROSS/BLUE SHIELD | Admitting: *Deleted

## 2015-12-19 DIAGNOSIS — Z79899 Other long term (current) drug therapy: Secondary | ICD-10-CM

## 2015-12-19 DIAGNOSIS — Z9889 Other specified postprocedural states: Secondary | ICD-10-CM

## 2015-12-19 LAB — POCT INR: INR: 2.4

## 2015-12-20 LAB — BASIC METABOLIC PANEL
BUN: 25 mg/dL (ref 7–25)
CHLORIDE: 99 mmol/L (ref 98–110)
CO2: 25 mmol/L (ref 20–31)
CREATININE: 1.16 mg/dL — AB (ref 0.50–1.05)
Calcium: 9.6 mg/dL (ref 8.6–10.4)
Glucose, Bld: 78 mg/dL (ref 65–99)
POTASSIUM: 4.2 mmol/L (ref 3.5–5.3)
Sodium: 133 mmol/L — ABNORMAL LOW (ref 135–146)

## 2015-12-23 ENCOUNTER — Encounter (HOSPITAL_COMMUNITY)
Admission: RE | Admit: 2015-12-23 | Discharge: 2015-12-23 | Disposition: A | Discharge status: Home / Self Care | Payer: BLUE CROSS/BLUE SHIELD | Source: Ambulatory Visit | Attending: Cardiology | Admitting: Cardiology

## 2015-12-23 VITALS — BP 104/60 | HR 99 | Ht 70.0 in | Wt 130.5 lb

## 2015-12-23 DIAGNOSIS — Z9889 Other specified postprocedural states: Secondary | ICD-10-CM | POA: Diagnosis present

## 2015-12-23 DIAGNOSIS — Z954 Presence of other heart-valve replacement: Secondary | ICD-10-CM | POA: Insufficient documentation

## 2015-12-23 NOTE — Progress Notes (Signed)
Cardiac Rehab Medication Review by a Pharmacist  Does the patient  feel that his/her medications are working for him/her?  yes  Has the patient been experiencing any side effects to the medications prescribed?  no  Does the patient measure his/her own blood pressure or blood glucose at home?  no   Does the patient have any problems obtaining medications due to transportation or finances?   no  Understanding of regimen: excellent Understanding of indications: excellent Potential of compliance: excellent  Pharmacist comments: 55 y/o F presents to cardiac rehab without assistance. Reviewed medication list and no issues were identified. Pt does not regularly check blood pressure at home and provided counseling on this. Pt verbalized understanding of treatment plan.   Angela Burke, PharmD Pharmacy Resident Pager: [phone] 12/23/2015 2:05 PM

## 2015-12-25 ENCOUNTER — Encounter (HOSPITAL_COMMUNITY): Payer: Self-pay

## 2015-12-25 NOTE — Progress Notes (Signed)
Cardiac Individual Treatment Plan  Patient Details  Name: Cristina Garcia MRN: [id number] Date of Birth: 1961-05-04 Referring Provider:        CARDIAC REHAB PHASE II ORIENTATION from 12/23/2015 in [facility]   Referring Provider  [name] MD      Initial Encounter Date:       CARDIAC REHAB PHASE II ORIENTATION from 12/23/2015 in [place]   Date  12/23/15   Referring Provider  [name] MD      Visit Diagnosis: S/P mitral valve repair  Patient's Home Medications on Admission:  Current outpatient prescriptions:  .  acyclovir (ZOVIRAX) 200 MG capsule, Take 200 mg by mouth daily as needed. Reported on 12/05/2015, Disp: , Rfl:  .  aspirin 81 MG tablet, Take 81 mg by mouth daily. Reported on 10/08/2015, Disp: , Rfl:  .  calcium carbonate (OS-CAL) 600 MG TABS, Take 600 mg by mouth daily with breakfast. , Disp: , Rfl:  .  Estrogens, Conjugated (PREMARIN VA), Place vaginally daily as needed. , Disp: , Rfl:  .  ferrous sulfate 325 (65 FE) MG tablet, Take 325 mg by mouth daily., Disp: , Rfl:  .  lisinopril (PRINIVIL,ZESTRIL) 5 MG tablet, Take 1 tablet (5 mg total) by mouth daily., Disp: 30 tablet, Rfl: 6 .  loratadine (CLARITIN) 10 MG tablet, Take 10 mg by mouth daily., Disp: , Rfl:  .  metoprolol succinate (TOPROL-XL) 25 MG 24 hr tablet, TAKE 1 TABLET (25 MG TOTAL) BY MOUTH DAILY., Disp: 90 tablet, Rfl: 1 .  Multiple Vitamins-Minerals (MULTIVITAL PO), Take 1 tablet by mouth daily.  , Disp: , Rfl:  .  warfarin (COUMADIN) 5 MG tablet, Take 1 tablet (5 mg total) by mouth daily at 6 PM. And as prescribed by the coumadin clinic (Patient taking differently: Take 5-7.5 mg by mouth daily at 6 PM. And as prescribed by the coumadin clinic), Disp: 100 tablet, Rfl: 1 No current facility-administered medications for this encounter.  Facility-Administered Medications Ordered in Other Encounters:  .  glutaraldehyde 0.625% soaking  solution, , Topical, PRN, Rexene Alberts, MD  Past Medical History: Past Medical History  Diagnosis Date  . Allergy   . H/O multiple myeloma     clinical remission  . Mitral valve prolapse   . Hypertension   . Osteoporosis   . Constipation   . Mitral regurgitation   . Atrial tachycardia ([place]) 12/18/2014    24 hour event monitor with frequent PAC's, PVC's and short runs of atrial tachycardia - no sustained arrhythmias or atrial fibrillation   . Anxiety   . Chronic kidney disease (CKD)   . S/P minimally invasive mitral valve repair 10/15/2015    Complex valvuloplasty including Alfieri edge-to-edge repair, generous quadrangular resection (shortening) of posterior leaflet, sliding leaflet plasty, Gore-tex neochord placement x6, and 38 mm Edwards Physio II ring annuloplasty via right mini thoracotomy approach   . Pleural effusion, right 11/03/2015    Tobacco Use: History  Smoking status  . Never Smoker   Smokeless tobacco  . Never Used    Labs:     Recent Review Flowsheet Data    Labs for ITP Cardiac and Pulmonary Rehab Latest Ref Rng 10/15/2015 10/15/2015 10/16/2015 10/16/2015 10/16/2015   PHART 7.350 - 7.450 - 7.291(L) 7.283(L) 7.318(L) -   PCO2ART 35.0 - 45.0 mmHg - 41.2 41.1 42.3 -   HCO3 20.0 - 24.0 mEq/L - 20.0 19.6(L) 21.9 -   TCO2 0 -  100 mmol/L _0 ACIDBASEDEF 0.0 - 2.0 mmol/L - 6.0(H) 7.0(H) 4.0(H) -   O2SAT - - 99.0 92.0 96.0 -      Capillary Blood Glucose: Lab Results  Component Value Date   GLUCAP 158* 10/17/2015   GLUCAP 179* 10/17/2015   GLUCAP 117* 10/17/2015   GLUCAP 80 10/17/2015   GLUCAP 96 10/17/2015     Exercise Target Goals: Date: 12/23/15  Exercise Program Goal: Individual exercise prescription set with THRR, safety & activity barriers. Participant demonstrates ability to understand and report RPE using BORG scale, to self-measure pulse accurately, and to acknowledge the importance of the exercise prescription.  Exercise Prescription  Goal: Starting with aerobic activity 30 plus minutes a day, 3 days per week for initial exercise prescription. Provide home exercise prescription and guidelines that participant acknowledges understanding prior to discharge.  Activity Barriers & Risk Stratification:     Activity Barriers & Cardiac Risk Stratification - 12/23/15 1703    Activity Barriers & Cardiac Risk Stratification   Activity Barriers None   Cardiac Risk Stratification Moderate      6 Minute Walk:     6 Minute Walk      12/23/15 1700       6 Minute Walk   Phase Initial     Distance 1767 feet     Walk Time 6 minutes     # of Rest Breaks 0     MPH 3.35     METS 4.96     RPE 11     VO2 Peak 17.36     Symptoms No     Resting HR 99 bpm     Resting BP 104/60 mmHg     Max Ex. HR 110 bpm     Max Ex. BP 104/60 mmHg     2 Minute Post BP 98/62 mmHg        Initial Exercise Prescription:     Initial Exercise Prescription - 12/23/15 1700    Date of Initial Exercise RX and Referring Provider   Date 12/23/15   Referring Provider [name] MD   Treadmill   MPH 2.5   Grade 1   Minutes 10   METs 3.26   Bike   Level 1.2   Minutes 10   METs 4.77   NuStep   Level 3   Minutes 10   METs 2.8   Prescription Details   Frequency (times per week) 3   Duration Progress to 30 minutes of continuous aerobic without signs/symptoms of physical distress   Intensity   THRR 40-80% of Max Heartrate 66-132   Ratings of Perceived Exertion 11-13   Perceived Dyspnea 0-4      Perform Capillary Blood Glucose checks as needed.  Exercise Prescription Changes:   Exercise Comments:   Discharge Exercise Prescription (Final Exercise Prescription Changes):   Nutrition:  Target Goals: Understanding of nutrition guidelines, daily intake of sodium <1546m, cholesterol <203m calories 30% from fat and 7% or less from saturated fats, daily to have 5 or more servings of fruits and vegetables.  Biometrics:     Pre  Biometrics - 12/23/15 1702    Pre Biometrics   Waist Circumference 25.5 inches   Hip Circumference 36.5 inches   Waist to Hip Ratio 0.7 %   Triceps Skinfold 26 mm   % Body Fat 29.2 %   Grip Strength 27 kg   Flexibility 13 in   Single Leg Stand 30 seconds  Nutrition Therapy Plan and Nutrition Goals:     Nutrition Therapy & Goals - 12/24/15 1521    Nutrition Therapy   Diet Therapeutic Lifestyle Changes   Drug/Food Interactions Coumadin/Vit K   Personal Nutrition Goals   Personal Goal #1 Maintain wt around 130 lb while in [place], educate and counsel regarding individualized specific dietary modifications aiming towards targeted core components such as weight, hypertension, lipid management, diabetes, heart failure and other comorbidities.   Expected Outcomes Short Term Goal: Understand basic principles of dietary content, such as calories, fat, sodium, cholesterol and nutrients.;Long Term Goal: Adherence to prescribed nutrition plan.      Nutrition Discharge: Nutrition Scores:   Nutrition Goals Re-Evaluation:   Psychosocial: Target Goals: Acknowledge presence or absence of depression, maximize coping skills, provide positive support system. Participant is able to verbalize types and ability to use techniques and skills needed for reducing stress and depression.  Initial Review & Psychosocial Screening:     Initial Psych Review & Screening - 12/25/15 [place]? Yes   Barriers   Psychosocial barriers to participate in program There are no identifiable barriers or psychosocial needs.;The patient should benefit from training in stress management and relaxation.   Screening Interventions   Interventions Encouraged to exercise      Quality of Life Scores:     Quality of Life - 12/23/15 1704    Quality of Life Scores   Health/Function Pre 19.46 %   Socioeconomic Pre 24.5 %    Psych/Spiritual Pre 24.21 %   Family Pre 27.6 %   GLOBAL Pre 22.93 %      PHQ-9:     Recent Review Flowsheet Data    There is no flowsheet data to display.      Psychosocial Evaluation and Intervention:   Psychosocial Re-Evaluation:   Vocational Rehabilitation: Provide vocational rehab assistance to qualifying candidates.   Vocational Rehab Evaluation & Intervention:     Vocational Rehab - 12/25/15 1141    Initial Vocational Rehab Evaluation & Intervention   Assessment shows need for Vocational Rehabilitation No      Education: Education Goals: Education classes will be provided on a weekly basis, covering required topics. Participant will state understanding/return demonstration of topics presented.  Learning Barriers/Preferences:     Learning Barriers/Preferences - 12/25/15 1140    Learning Barriers/Preferences   Learning Barriers Hearing;Sight   Learning Preferences Written Material      Education Topics: Count Your Pulse:  -Group instruction provided by verbal instruction, demonstration, patient participation and written materials to support subject.  Instructors address importance of being able to find your pulse and how to count your pulse when at home without a heart monitor.  Patients get hands on experience counting their pulse with staff help and individually.   Heart Attack, Angina, and Risk Factor Modification:  -Group instruction provided by verbal instruction, video, and written materials to support subject.  Instructors address signs and symptoms of angina and heart attacks.    Also discuss risk factors for heart disease and how to make changes to improve heart health risk factors.   Functional Fitness:  -Group instruction provided by verbal instruction, demonstration, patient participation, and written materials to support subject.  Instructors address safety measures for doing things around the house.  Discuss how to get up and down off the  floor, how to pick things up properly, how to safely get  out of a chair without assistance, and balance training.   Meditation and Mindfulness:  -Group instruction provided by verbal instruction, patient participation, and written materials to support subject.  Instructor addresses importance of mindfulness and meditation practice to help reduce stress and improve awareness.  Instructor also leads participants through a meditation exercise.    Stretching for Flexibility and Mobility:  -Group instruction provided by verbal instruction, patient participation, and written materials to support subject.  Instructors lead participants through series of stretches that are designed to increase flexibility thus improving mobility.  These stretches are additional exercise for major muscle groups that are typically performed during regular warm up and cool down.   Hands Only CPR Anytime:  -Group instruction provided by verbal instruction, video, patient participation and written materials to support subject.  Instructors co-teach with AHA video for hands only CPR.  Participants get hands on experience with mannequins.   Nutrition I class: Heart Healthy Eating:  -Group instruction provided by PowerPoint slides, verbal discussion, and written materials to support subject matter. The instructor gives an explanation and review of the Therapeutic Lifestyle Changes diet recommendations, which includes a discussion on lipid goals, dietary fat, sodium, fiber, plant stanol/sterol esters, sugar, and the components of a well-balanced, healthy diet.   Nutrition II class: Lifestyle Skills:  -Group instruction provided by PowerPoint slides, verbal discussion, and written materials to support subject matter. The instructor gives an explanation and review of label reading, grocery shopping for heart health, heart healthy recipe modifications, and ways to make healthier choices when eating out.   Diabetes Question &  Answer:  -Group instruction provided by PowerPoint slides, verbal discussion, and written materials to support subject matter. The instructor gives an explanation and review of diabetes co-morbidities, pre- and post-prandial blood glucose goals, pre-exercise blood glucose goals, signs, symptoms, and treatment of hypoglycemia and hyperglycemia, and foot care basics.   Diabetes Blitz:  -Group instruction provided by PowerPoint slides, verbal discussion, and written materials to support subject matter. The instructor gives an explanation and review of the physiology behind type 1 and type 2 diabetes, diabetes medications and rational behind using different medications, pre- and post-prandial blood glucose recommendations and Hemoglobin A1c goals, diabetes diet, and exercise including blood glucose guidelines for exercising safely.    Portion Distortion:  -Group instruction provided by PowerPoint slides, verbal discussion, written materials, and food models to support subject matter. The instructor gives an explanation of serving size versus portion size, changes in portions sizes over the last 20 years, and what consists of a serving from each food group.   Stress Management:  -Group instruction provided by verbal instruction, video, and written materials to support subject matter.  Instructors review role of stress in heart disease and how to cope with stress positively.     Exercising on Your Own:  -Group instruction provided by verbal instruction, power point, and written materials to support subject.  Instructors discuss benefits of exercise, components of exercise, frequency and intensity of exercise, and end points for exercise.  Also discuss use of nitroglycerin and activating EMS.  Review options of places to exercise outside of rehab.  Review guidelines for sex with heart disease.   Cardiac Drugs I:  -Group instruction provided by verbal instruction and written materials to support  subject.  Instructor reviews cardiac drug classes: antiplatelets, anticoagulants, beta blockers, and statins.  Instructor discusses reasons, side effects, and lifestyle considerations for each drug class.   Cardiac Drugs II:  -Group instruction provided by verbal  instruction and written materials to support subject.  Instructor reviews cardiac drug classes: angiotensin converting enzyme inhibitors (ACE-I), angiotensin II receptor blockers (ARBs), nitrates, and calcium channel blockers.  Instructor discusses reasons, side effects, and lifestyle considerations for each drug class.   Anatomy and Physiology of the Circulatory System:  -Group instruction provided by verbal instruction, video, and written materials to support subject.  Reviews functional anatomy of heart, how it relates to various diagnoses, and what role the heart plays in the overall system.   Knowledge Questionnaire Score:     Knowledge Questionnaire Score - 12/23/15 1703    Knowledge Questionnaire Score   Pre Score 22/24      Core Components/Risk Factors/Patient Goals at Admission:     Personal Goals and Risk Factors at Admission - 12/26/15 0657    Core Components/Risk Factors/Patient Goals on Admission   Hypertension Yes   Intervention Provide education on lifestyle modifcations including regular physical activity/exercise, weight management, moderate sodium restriction and increased consumption of fresh fruit, vegetables, and low fat dairy, alcohol moderation, and smoking cessation.;Monitor prescription use compliance.   Expected Outcomes Short Term: Continued assessment and intervention until BP is < 140/10m HG in hypertensive participants. < 130/844mHG in hypertensive participants with diabetes, heart failure or chronic kidney disease.;Long Term: Maintenance of blood pressure at goal levels.   Personal Goal Other Yes   Personal Goal Know what she can do physically. Get back to safe exercise routine.   Intervention  Provide individualized home exercise program including frequency, duration, intensity and safe parameters.   Expected Outcomes Patient understands safe parameters for exercise including appropriate target heart rate, rate of perceived exertion and warning signs/symptoms.      Core Components/Risk Factors/Patient Goals Review:    Core Components/Risk Factors/Patient Goals at Discharge (Final Review):    ITP Comments:     ITP Comments      12/23/15 1342           ITP Comments Medical Director- Dr. TrFransico HimMD          Comments: Patient attended orientation from 1330 to 1530  to review rules and guidelines for program. Completed 6 minute walk test, Intitial ITP, and exercise prescription.  VSS. Telemetry-Sinus rhythm with a rare PVC with a questionable occasional fusion beat .  Asymptomatic. Will fax today's ECG tracings to Dr McClaris Gladdenffice for review.MaHarrell GaveN BSN

## 2015-12-26 ENCOUNTER — Other Ambulatory Visit: Payer: Self-pay | Admitting: Thoracic Surgery (Cardiothoracic Vascular Surgery)

## 2015-12-29 ENCOUNTER — Ambulatory Visit (INDEPENDENT_AMBULATORY_CARE_PROVIDER_SITE_OTHER): Payer: Self-pay | Admitting: Thoracic Surgery (Cardiothoracic Vascular Surgery)

## 2015-12-29 ENCOUNTER — Ambulatory Visit
Admission: RE | Admit: 2015-12-29 | Discharge: 2015-12-29 | Disposition: A | Discharge status: Home / Self Care | Payer: BLUE CROSS/BLUE SHIELD | Source: Ambulatory Visit | Attending: Thoracic Surgery (Cardiothoracic Vascular Surgery) | Admitting: Thoracic Surgery (Cardiothoracic Vascular Surgery)

## 2015-12-29 ENCOUNTER — Encounter: Payer: Self-pay | Admitting: Thoracic Surgery (Cardiothoracic Vascular Surgery)

## 2015-12-29 ENCOUNTER — Encounter (HOSPITAL_COMMUNITY)
Admission: RE | Admit: 2015-12-29 | Discharge: 2015-12-29 | Disposition: A | Discharge status: Home / Self Care | Payer: BLUE CROSS/BLUE SHIELD | Source: Ambulatory Visit | Attending: Cardiology | Admitting: Cardiology

## 2015-12-29 VITALS — BP 130/88 | HR 100 | Resp 20 | Ht 69.0 in | Wt 131.0 lb

## 2015-12-29 DIAGNOSIS — I341 Nonrheumatic mitral (valve) prolapse: Secondary | ICD-10-CM

## 2015-12-29 DIAGNOSIS — J948 Other specified pleural conditions: Secondary | ICD-10-CM

## 2015-12-29 DIAGNOSIS — I340 Nonrheumatic mitral (valve) insufficiency: Secondary | ICD-10-CM

## 2015-12-29 DIAGNOSIS — Z9889 Other specified postprocedural states: Secondary | ICD-10-CM

## 2015-12-29 DIAGNOSIS — J90 Pleural effusion, not elsewhere classified: Secondary | ICD-10-CM | POA: Diagnosis not present

## 2015-12-29 DIAGNOSIS — Z952 Presence of prosthetic heart valve: Secondary | ICD-10-CM | POA: Diagnosis not present

## 2015-12-29 NOTE — Progress Notes (Signed)
[address]       [place] [address]             [phone]     CARDIOTHORACIC SURGERY OFFICE NOTE  Referring Provider is Larey Dresser, MD PCP is Joycelyn Man, MD   HPI:  Patient is a 55 year old female who returns to the office today for routine follow-up status post minimally invasive mitral valve repair on 10/15/2015 for severe symptom attic primary mitral regurgitation.  Her early postoperative recovery was uneventful and she was last seen here in our office on 11/18/2015 at which time she was making good progress. Since then she has been seen in follow-up by Dr. Aundra Dubin on 12/05/2015 at which time she underwent follow-up echocardiogram that revealed intact mitral valve repair with no residual mitral regurgitation. Left ventricular ejection fraction had dropped post repair to 45%.  She was restarted on low-dose lisinopril. She remains on warfarin anticoagulation. She returns to our office for routine follow-up today. She just started the outpatient cardiac rehabilitation program. She has not had any shortness of breath. Her exercise tolerance has been gradually improving. She is still not close to her baseline, but overall she is happy to report that she continues to improve.  She is sleeping comfortably at night. She has no pain in her chest. She has not had palpitations. She is back at work part-time. Overall she is satisfied with her progress.   Current Outpatient Prescriptions  Medication Sig Dispense Refill  . acyclovir (ZOVIRAX) 200 MG capsule Take 200 mg by mouth daily as needed. Reported on 12/05/2015    . aspirin 81 MG tablet Take 81 mg by mouth daily. Reported on 10/08/2015    . calcium carbonate (OS-CAL) 600 MG TABS Take 600 mg by mouth daily with breakfast.     . Estrogens, Conjugated (PREMARIN VA) Place vaginally daily as needed.     . ferrous sulfate 325 (65 FE) MG tablet Take 325 mg by mouth daily.    Marland Kitchen lisinopril (PRINIVIL,ZESTRIL) 5 MG  tablet Take 1 tablet (5 mg total) by mouth daily. 30 tablet 6  . loratadine (CLARITIN) 10 MG tablet Take 10 mg by mouth daily.    . metoprolol succinate (TOPROL-XL) 25 MG 24 hr tablet TAKE 1 TABLET (25 MG TOTAL) BY MOUTH DAILY. 90 tablet 1  . Multiple Vitamins-Minerals (MULTIVITAL PO) Take 1 tablet by mouth daily.      Marland Kitchen warfarin (COUMADIN) 5 MG tablet Take 1 tablet (5 mg total) by mouth daily at 6 PM. And as prescribed by the coumadin clinic (Patient taking differently: Take 5-7.5 mg by mouth daily at 6 PM. And as prescribed by the coumadin clinic) 100 tablet 1   No current facility-administered medications for this visit.      Physical Exam:   BP 130/88 mmHg  Pulse 100  Resp 20  Ht '5\' 9"'  (1.753 m)  Wt 131 lb (59.421 kg)  BMI 19.34 kg/m2  SpO2 98%  General:  Well appearing  Chest:   Clear to auscultation  CV:   Regular rate and rhythm without murmur  Incisions:  Healing nicely  Abdomen:  Soft and nontender  Extremities:  Warm and well-perfused, no edema  Diagnostic Tests:  Transthoracic Echocardiography  Patient: Cristina Garcia, Cristina Garcia MR #: [id number] Study Date: 12/05/2015 Gender: F Age: 38 Height: 175.3 cm Weight: 59 kg BSA: 1.69 m^2 Pt. Status: Room:  ATTENDING [name], M.D. ORDERING [name], M.D. REFERRING [name], M.D. SONOGRAPHER  [name], Outpatient  cc:  ------------------------------------------------------------------- LV EF: 45%  ------------------------------------------------------------------- Indications: 424.0 Mitral valve disease.  ------------------------------------------------------------------- History: PMH: S/P mitral valve repair. H/O bileaflet mitral valve prolapse. Hypertension. H/O multiple myeloma.  ------------------------------------------------------------------- Study Conclusions  - Left ventricle: The cavity size was  mildly dilated. Wall  thickness was normal. The estimated ejection fraction was 45%.  Diffuse hypokinesis. The study is not technically sufficient to  allow evaluation of LV diastolic function. - Mitral valve: Post repair with annuloplasty ring. Unusual post op  appearence with doming and tethering of the anterior leaflet  tip at the chordal level Mild diastolic gradient no signficant MS  by Pt1/2 Trivial residual MR. Valve area by continuity equation  (using LVOT flow): 0.82 cm^2. - Atrial septum: No defect or patent foramen ovale was identified. - Pulmonary arteries: PA peak pressure: 48 mm Hg (S).  Transthoracic echocardiography. M-mode, complete 2D, spectral Doppler, and color Doppler. Birthdate: Patient birthdate: 1960/12/20. Age: Patient is 55 yr old. Sex: Gender: female. BMI: 19.2 kg/m^2. Blood pressure: 135/94 Patient status: Outpatient. Study date: Study date: 12/05/2015. Study time: 02:22 PM. Location: Echo laboratory.  -------------------------------------------------------------------  ------------------------------------------------------------------- Left ventricle: The cavity size was mildly dilated. Wall thickness was normal. The estimated ejection fraction was 45%. Diffuse hypokinesis. The study is not technically sufficient to allow evaluation of LV diastolic function.  ------------------------------------------------------------------- Aortic valve: Mildly thickened leaflets. Doppler: There was no stenosis.  ------------------------------------------------------------------- Aorta: The aorta was normal, not dilated, and non-diseased.  ------------------------------------------------------------------- Mitral valve: Post repair with annuloplasty ring. Unusual post op appearence with doming and tethering of the anterior leaflet tip at the chordal level Mild diastolic gradient no signficant MS by Pt1/2 Trivial residual MR.  Doppler: Valve area by pressure half-time: 3.24 cm^2. Indexed valve area by pressure half-time: 1.92 cm^2/m^2. Valve area by continuity equation (using LVOT flow): 0.82 cm^2. Indexed valve area by continuity equation (using LVOT flow): 0.49 cm^2/m^2. Mean gradient (D): 8 mm Hg.  ------------------------------------------------------------------- Atrial septum: No defect or patent foramen ovale was identified.  ------------------------------------------------------------------- Right ventricle: The cavity size was normal. Wall thickness was normal. Systolic function was normal.  ------------------------------------------------------------------- Pulmonic valve: Doppler: There was trivial regurgitation.  ------------------------------------------------------------------- Tricuspid valve: Doppler: There was mild regurgitation.  ------------------------------------------------------------------- Right atrium: The atrium was normal in size.  ------------------------------------------------------------------- Pericardium: The pericardium was normal in appearance.  ------------------------------------------------------------------- Systemic veins: Inferior vena cava: The vessel was normal in size. The respirophasic diameter changes were in the normal range (= 50%), consistent with normal central venous pressure.  ------------------------------------------------------------------- Post procedure conclusions Ascending Aorta:  - The aorta was normal, not dilated, and non-diseased.  ------------------------------------------------------------------- Measurements  Left ventricle Value Reference LV ID, ED, PLAX chordal 43.7 mm 43 - 52 LV ID, ES, PLAX chordal 30.2 mm 23 - 38 LV fx shortening, PLAX chordal 31 % >=29 LV PW thickness,  ED 9.99 mm --------- IVS/LV PW ratio, ED 0.83 <=1.3 Stroke volume, 2D 36 ml --------- Stroke volume/bsa, 2D 21 ml/m^2 --------- LV ejection fraction, 1-p A4C 39 % --------- LV end-diastolic volume, 2-p 79 ml --------- LV end-diastolic volume/bsa, 2-p 47 ml/m^2 --------- LV e&', lateral 6.08 cm/s --------- Longitudinal strain, TDI 13 % ---------  Ventricular septum Value Reference IVS thickness, ED 8.28 mm ---------  LVOT Value Reference LVOT ID, S 19 mm --------- LVOT area 2.84 cm^2 --------- LVOT peak velocity, S 86.7 cm/s --------- LVOT mean velocity, S 55.3 cm/s --------- LVOT VTI, S 12.8 cm ---------  Aorta Value Reference Aortic root ID, ED 23  mm ---------  Left atrium Value Reference LA ID, A-P, ES 33 mm --------- LA ID/bsa, A-P 1.96 cm/m^2 <=2.2 LA volume, ES, 1-p A4C 51.5 ml --------- LA volume/bsa, ES, 1-p A4C 30.6 ml/m^2 --------- LA volume, ES, 1-p A2C 79.6 ml --------- LA volume/bsa, ES, 1-p A2C 47.2 ml/m^2 ---------  Mitral valve Value Reference Mitral mean velocity, D 122 cm/s --------- Mitral pressure  half-time 68 ms --------- Mitral mean gradient, D 8 mm Hg --------- Mitral valve area, PHT, DP 3.24 cm^2 --------- Mitral valve area/bsa, PHT, DP 1.92 cm^2/m^2 --------- Mitral valve area, LVOT continuity 0.82 cm^2 --------- Mitral valve area/bsa, LVOT 0.49 cm^2/m^2 --------- continuity Mitral annulus VTI, D 44.5 cm ---------  Pulmonary arteries Value Reference PA pressure, S, DP (H) 48 mm Hg <=30  Tricuspid valve Value Reference Tricuspid regurg peak velocity 286 cm/s --------- Tricuspid peak RV-RA gradient 33 mm Hg ---------  Systemic veins Value Reference Estimated CVP 15 mm Hg ---------  Right ventricle Value Reference TAPSE 11.8 mm --------- RV pressure, S, DP (H) 48 mm Hg <=30 RV s&', lateral, S 9.24 cm/s ---------  Legend: (L) and (H) mark values outside specified reference range.  ------------------------------------------------------------------- Prepared and Electronically Authenticated by  Jenkins Rouge, M.D. 2017-06-23T15:39:04    CHEST 2 VIEW  COMPARISON: Chest x-ray of November 17, 2015  FINDINGS: The lungs are mildly hyperinflated. There remains a small right pleural effusion resulting in blunting of the lateral and posterior costophrenic angles. The lung markings remain coarse in the right middle lobe. There is no mediastinal shift. There is stable biapical pleural thickening. The left lung is clear. The heart and pulmonary vascularity are normal. The repaired mitral  valve ring is visible.  IMPRESSION: Further interval decrease in the volume of pleural fluid at the right lung base but a small effusion persists. COPD. No CHF.   Electronically Signed  By: David Martinique M.D.  On: 12/29/2015 16:34    Impression:  Patient is doing well more than 2 months status post minimally invasive mitral valve repair. Follow-up echocardiogram demonstrates intact mitral valve repair with no residual mitral regurgitation. Left ventricular ejection fraction has dropped somewhat to 45%.  Plan:  I have encouraged the patient to continue to gradually increase her physical activity as tolerated without any particular limitations at this time. We have not recommended any changes to her current medications, but I instructed the patient that she may stop taking warfarin in approximately 2 weeks. She will remain on aspirin a day. The patient has been reminded regarding the importance of dental hygiene and the lifelong need for antibiotic prophylaxis for all dental cleanings and other related invasive procedures.  She will continue to follow-up with Dr. Aundra Dubin. We will plan to see her back for routine follow-up next spring, aproximately one year following her surgery. She will call and return sooner should specific problems or questions arise.   Valentina Gu. Roxy Manns, MD 12/29/2015 5:23 PM

## 2015-12-29 NOTE — Patient Instructions (Addendum)
You may gradually resume unrestricted physical activity without any particular limitations at this time.  You may stop taking warfarin (Coumadin) in 2 weeks.   Continue all other medications without any changes at this time  Endocarditis is a potentially serious infection of heart valves or inside lining of the heart.  It occurs more commonly in patients with diseased heart valves (such as patient's with aortic or mitral valve disease) and in patients who have undergone heart valve repair or replacement.  Certain surgical and dental procedures may put you at risk, such as dental cleaning, other dental procedures, or any surgery involving the respiratory, urinary, gastrointestinal tract, gallbladder or prostate gland.   To minimize your chances for develooping endocarditis, maintain good oral health and seek prompt medical attention for any infections involving the mouth, teeth, gums, skin or urinary tract.    Always notify your doctor or dentist about your underlying heart valve condition before having any invasive procedures. You will need to take antibiotics before certain procedures, including all routine dental cleanings or other dental procedures.  Your cardiologist or dentist should prescribe these antibiotics for you to be taken ahead of time.

## 2015-12-29 NOTE — Progress Notes (Signed)
Daily Session Note  Patient Details  Name: Cristina Garcia MRN: [id number] Date of Birth: 1960/09/14 Referring Provider:        CARDIAC REHAB PHASE II ORIENTATION from 12/23/2015 in [place]   Referring Provider  [name] MD      Encounter Date: 12/29/2015  Check In:     Session Check In - 12/29/15 0717    Check-In   Location [facility]   Staff Present Maurice Small, RN, Luisa Hart, RN, BSN;Molly diVincenzo, MS, ACSM RCEP, Exercise Physiologist;Olinty Celesta Aver, MS, ACSM CEP, Exercise Physiologist   Supervising physician immediately available to respond to emergencies [facility] immediately available   Physician(s) Dr. Marily Memos   Medication changes reported     No   Fall or balance concerns reported    No   Warm-up and Cool-down Performed as group-led instruction   Resistance Training Performed Yes   VAD Patient? No   Pain Assessment   Currently in Pain? No/denies      Capillary Blood Glucose: No results found for this or any previous visit (from the past 24 hour(s)).   Goals Met:  Exercise tolerated well Personal goals reviewed No report of cardiac concerns or symptoms  Goals Unmet:  Not Applicable  Comments:   Pt started first full day of phase II cardiac rehab today.  Pt tolerated light exercise without difficulty. VSS, telemetry-SR ST, asymptomatic.  Medication list reconciled. Pt denies barriers to medicaiton compliance.  PSYCHOSOCIAL ASSESSMENT:  PHQ-1. Pt exhibits positive coping skills, hopeful outlook with supportive family. Pt has had some "blue" days since her surgery.  Pt has returned back to work and is working her way back to full time hours.  Pt admits that she fatigues easily toward the end of the day and at times she does not feel as mentally sharp and able to multi task efficiently. Pt further states that this is getting better and she receives support from her significant other and  friends.  No immediate psychosocial needs identified at this time, no psychosocial interventions necessary.    Pt enjoys gardening, walking, dancing socializing and going out to dinner.  Pt goals are to know what she can do physically and to get back to exercise routine safely. Pt main mode of exercise was walking 45 minutes to 1 hour with friends in the neighborhood. Will do periodic check in with pt to assess the achievement of these goals.  Pt oriented to exercise equipment and routine.    Understanding verbalized.  Maurice Small RN, BSN     Dr. Fransico Him is Medical Director for Cardiac Rehab at [facility].

## 2015-12-31 ENCOUNTER — Encounter (HOSPITAL_COMMUNITY): Payer: BLUE CROSS/BLUE SHIELD

## 2016-01-01 NOTE — Progress Notes (Signed)
Cardiac Individual Treatment Plan  Patient Details  Name: Cristina Garcia MRN: [id number] Date of Birth: Apr 27, 1961 Referring Provider:        CARDIAC REHAB PHASE II ORIENTATION from 12/23/2015 in [place]   Referring Provider  [name] MD      Initial Encounter Date:       CARDIAC REHAB PHASE II ORIENTATION from 12/23/2015 in [place]   Date  12/23/15   Referring Provider  [name] MD      Visit Diagnosis: S/P mitral valve repair  Patient's Home Medications on Admission:  Current outpatient prescriptions:  .  acyclovir (ZOVIRAX) 200 MG capsule, Take 200 mg by mouth daily as needed. Reported on 12/05/2015, Disp: , Rfl:  .  aspirin 81 MG tablet, Take 81 mg by mouth daily. Reported on 10/08/2015, Disp: , Rfl:  .  calcium carbonate (OS-CAL) 600 MG TABS, Take 600 mg by mouth daily with breakfast. , Disp: , Rfl:  .  Estrogens, Conjugated (PREMARIN VA), Place vaginally daily as needed. , Disp: , Rfl:  .  ferrous sulfate 325 (65 FE) MG tablet, Take 325 mg by mouth daily., Disp: , Rfl:  .  lisinopril (PRINIVIL,ZESTRIL) 5 MG tablet, Take 1 tablet (5 mg total) by mouth daily., Disp: 30 tablet, Rfl: 6 .  loratadine (CLARITIN) 10 MG tablet, Take 10 mg by mouth daily., Disp: , Rfl:  .  metoprolol succinate (TOPROL-XL) 25 MG 24 hr tablet, TAKE 1 TABLET (25 MG TOTAL) BY MOUTH DAILY., Disp: 90 tablet, Rfl: 1 .  Multiple Vitamins-Minerals (MULTIVITAL PO), Take 1 tablet by mouth daily.  , Disp: , Rfl:  .  warfarin (COUMADIN) 5 MG tablet, Take 1 tablet (5 mg total) by mouth daily at 6 PM. And as prescribed by the coumadin clinic (Patient taking differently: Take 5-7.5 mg by mouth daily at 6 PM. And as prescribed by the coumadin clinic), Disp: 100 tablet, Rfl: 1  Past Medical History: Past Medical History  Diagnosis Date  . Allergy   . H/O multiple myeloma     clinical remission  . Mitral valve prolapse   . Hypertension    . Osteoporosis   . Constipation   . Mitral regurgitation   . Atrial tachycardia ([place]) 12/18/2014    24 hour event monitor with frequent PAC's, PVC's and short runs of atrial tachycardia - no sustained arrhythmias or atrial fibrillation   . Anxiety   . Chronic kidney disease (CKD)   . S/P minimally invasive mitral valve repair 10/15/2015    Complex valvuloplasty including Alfieri edge-to-edge repair, generous quadrangular resection (shortening) of posterior leaflet, sliding leaflet plasty, Gore-tex neochord placement x6, and 38 mm Edwards Physio II ring annuloplasty via right mini thoracotomy approach   . Pleural effusion, right 11/03/2015    Tobacco Use: History  Smoking status  . Never Smoker   Smokeless tobacco  . Never Used    Labs: Recent Review Flowsheet Data    Labs for ITP Cardiac and Pulmonary Rehab Latest Ref Rng 10/15/2015 10/15/2015 10/16/2015 10/16/2015 10/16/2015   PHART 7.350 - 7.450 - 7.291(L) 7.283(L) 7.318(L) -   PCO2ART 35.0 - 45.0 mmHg - 41.2 41.1 42.3 -   HCO3 20.0 - 24.0 mEq/L - 20.0 19.6(L) 21.9 -   TCO2 0 - 100 mmol/L '21 21 21 23 21   ' ACIDBASEDEF 0.0 - 2.0 mmol/L - 6.0(H) 7.0(H) 4.0(H) -   O2SAT - - 99.0 92.0 96.0 -  Capillary Blood Glucose: Lab Results  Component Value Date   GLUCAP 158* 10/17/2015   GLUCAP 179* 10/17/2015   GLUCAP 117* 10/17/2015   GLUCAP 80 10/17/2015   GLUCAP 96 10/17/2015     Exercise Target Goals:    Exercise Program Goal: Individual exercise prescription set with THRR, safety & activity barriers. Participant demonstrates ability to understand and report RPE using BORG scale, to self-measure pulse accurately, and to acknowledge the importance of the exercise prescription.  Exercise Prescription Goal: Starting with aerobic activity 30 plus minutes a day, 3 days per week for initial exercise prescription. Provide home exercise prescription and guidelines that participant acknowledges understanding prior to discharge.  Activity  Barriers & Risk Stratification:     Activity Barriers & Cardiac Risk Stratification - 12/23/15 1703    Activity Barriers & Cardiac Risk Stratification   Activity Barriers None   Cardiac Risk Stratification Moderate      6 Minute Walk:     6 Minute Walk      12/23/15 1700       6 Minute Walk   Phase Initial     Distance 1767 feet     Walk Time 6 minutes     # of Rest Breaks 0     MPH 3.35     METS 4.96     RPE 11     VO2 Peak 17.36     Symptoms No     Resting HR 99 bpm     Resting BP 104/60 mmHg     Max Ex. HR 110 bpm     Max Ex. BP 104/60 mmHg     2 Minute Post BP 98/62 mmHg        Initial Exercise Prescription:     Initial Exercise Prescription - 12/23/15 1700    Date of Initial Exercise RX and Referring Provider   Date 12/23/15   Referring Provider [name] MD   Treadmill   MPH 2.5   Grade 1   Minutes 10   METs 3.26   Bike   Level 1.2   Minutes 10   METs 4.77   NuStep   Level 3   Minutes 10   METs 2.8   Prescription Details   Frequency (times per week) 3   Duration Progress to 30 minutes of continuous aerobic without signs/symptoms of physical distress   Intensity   THRR 40-80% of Max Heartrate 66-132   Ratings of Perceived Exertion 11-13   Perceived Dyspnea 0-4      Perform Capillary Blood Glucose checks as needed.  Exercise Prescription Changes:   Exercise Comments:   Discharge Exercise Prescription (Final Exercise Prescription Changes):   Nutrition:  Target Goals: Understanding of nutrition guidelines, daily intake of sodium <15101m, cholesterol <2037m calories 30% from fat and 7% or less from saturated fats, daily to have 5 or more servings of fruits and vegetables.  Biometrics:     Pre Biometrics - 12/23/15 1702    Pre Biometrics   Waist Circumference 25.5 inches   Hip Circumference 36.5 inches   Waist to Hip Ratio 0.7 %   Triceps Skinfold 26 mm   % Body Fat 29.2 %   Grip Strength 27 kg   Flexibility 13 in    Single Leg Stand 30 seconds       Nutrition Therapy Plan and Nutrition Goals:     Nutrition Therapy & Goals - 12/24/15 1521    Nutrition Therapy   Diet Therapeutic Lifestyle Changes  Drug/Food Interactions Coumadin/Vit K   Personal Nutrition Goals   Personal Goal #1 Maintain wt around 130 lb while in [place], educate and counsel regarding individualized specific dietary modifications aiming towards targeted core components such as weight, hypertension, lipid management, diabetes, heart failure and other comorbidities.   Expected Outcomes Short Term Goal: Understand basic principles of dietary content, such as calories, fat, sodium, cholesterol and nutrients.;Long Term Goal: Adherence to prescribed nutrition plan.      Nutrition Discharge: Nutrition Scores:   Nutrition Goals Re-Evaluation:   Psychosocial: Target Goals: Acknowledge presence or absence of depression, maximize coping skills, provide positive support system. Participant is able to verbalize types and ability to use techniques and skills needed for reducing stress and depression.  Initial Review & Psychosocial Screening:     Initial Psych Review & Screening - 12/25/15 [place]? Yes   Barriers   Psychosocial barriers to participate in program There are no identifiable barriers or psychosocial needs.;The patient should benefit from training in stress management and relaxation.   Screening Interventions   Interventions Encouraged to exercise      Quality of Life Scores:     Quality of Life - 12/23/15 1704    Quality of Life Scores   Health/Function Pre 19.46 %   Socioeconomic Pre 24.5 %   Psych/Spiritual Pre 24.21 %   Family Pre 27.6 %   GLOBAL Pre 22.93 %      PHQ-9:     Recent Review Flowsheet Data    Depression screen PHQ 2/9 12/29/2015   Decreased Interest 0   Down, Depressed, Hopeless 1   PHQ - 2 Score 1       Psychosocial Evaluation and Intervention:   Psychosocial Re-Evaluation:   Vocational Rehabilitation: Provide vocational rehab assistance to qualifying candidates.   Vocational Rehab Evaluation & Intervention:     Vocational Rehab - 12/25/15 1141    Initial Vocational Rehab Evaluation & Intervention   Assessment shows need for Vocational Rehabilitation No      Education: Education Goals: Education classes will be provided on a weekly basis, covering required topics. Participant will state understanding/return demonstration of topics presented.  Learning Barriers/Preferences:     Learning Barriers/Preferences - 12/25/15 1140    Learning Barriers/Preferences   Learning Barriers Hearing;Sight   Learning Preferences Written Material      Education Topics: Count Your Pulse:  -Group instruction provided by verbal instruction, demonstration, patient participation and written materials to support subject.  Instructors address importance of being able to find your pulse and how to count your pulse when at home without a heart monitor.  Patients get hands on experience counting their pulse with staff help and individually.   Heart Attack, Angina, and Risk Factor Modification:  -Group instruction provided by verbal instruction, video, and written materials to support subject.  Instructors address signs and symptoms of angina and heart attacks.    Also discuss risk factors for heart disease and how to make changes to improve heart health risk factors.   Functional Fitness:  -Group instruction provided by verbal instruction, demonstration, patient participation, and written materials to support subject.  Instructors address safety measures for doing things around the house.  Discuss how to get up and down off the floor, how to pick things up properly, how to safely get out of a chair without assistance, and balance training.   Meditation and Mindfulness:  -  Group instruction  provided by verbal instruction, patient participation, and written materials to support subject.  Instructor addresses importance of mindfulness and meditation practice to help reduce stress and improve awareness.  Instructor also leads participants through a meditation exercise.    Stretching for Flexibility and Mobility:  -Group instruction provided by verbal instruction, patient participation, and written materials to support subject.  Instructors lead participants through series of stretches that are designed to increase flexibility thus improving mobility.  These stretches are additional exercise for major muscle groups that are typically performed during regular warm up and cool down.   Hands Only CPR Anytime:  -Group instruction provided by verbal instruction, video, patient participation and written materials to support subject.  Instructors co-teach with AHA video for hands only CPR.  Participants get hands on experience with mannequins.   Nutrition I class: Heart Healthy Eating:  -Group instruction provided by PowerPoint slides, verbal discussion, and written materials to support subject matter. The instructor gives an explanation and review of the Therapeutic Lifestyle Changes diet recommendations, which includes a discussion on lipid goals, dietary fat, sodium, fiber, plant stanol/sterol esters, sugar, and the components of a well-balanced, healthy diet.   Nutrition II class: Lifestyle Skills:  -Group instruction provided by PowerPoint slides, verbal discussion, and written materials to support subject matter. The instructor gives an explanation and review of label reading, grocery shopping for heart health, heart healthy recipe modifications, and ways to make healthier choices when eating out.   Diabetes Question & Answer:  -Group instruction provided by PowerPoint slides, verbal discussion, and written materials to support subject matter. The instructor gives an explanation and  review of diabetes co-morbidities, pre- and post-prandial blood glucose goals, pre-exercise blood glucose goals, signs, symptoms, and treatment of hypoglycemia and hyperglycemia, and foot care basics.   Diabetes Blitz:  -Group instruction provided by PowerPoint slides, verbal discussion, and written materials to support subject matter. The instructor gives an explanation and review of the physiology behind type 1 and type 2 diabetes, diabetes medications and rational behind using different medications, pre- and post-prandial blood glucose recommendations and Hemoglobin A1c goals, diabetes diet, and exercise including blood glucose guidelines for exercising safely.    Portion Distortion:  -Group instruction provided by PowerPoint slides, verbal discussion, written materials, and food models to support subject matter. The instructor gives an explanation of serving size versus portion size, changes in portions sizes over the last 20 years, and what consists of a serving from each food group.   Stress Management:  -Group instruction provided by verbal instruction, video, and written materials to support subject matter.  Instructors review role of stress in heart disease and how to cope with stress positively.     Exercising on Your Own:  -Group instruction provided by verbal instruction, power point, and written materials to support subject.  Instructors discuss benefits of exercise, components of exercise, frequency and intensity of exercise, and end points for exercise.  Also discuss use of nitroglycerin and activating EMS.  Review options of places to exercise outside of rehab.  Review guidelines for sex with heart disease.   Cardiac Drugs I:  -Group instruction provided by verbal instruction and written materials to support subject.  Instructor reviews cardiac drug classes: antiplatelets, anticoagulants, beta blockers, and statins.  Instructor discusses reasons, side effects, and lifestyle  considerations for each drug class.   Cardiac Drugs II:  -Group instruction provided by verbal instruction and written materials to support subject.  Instructor reviews cardiac drug classes: angiotensin  converting enzyme inhibitors (ACE-I), angiotensin II receptor blockers (ARBs), nitrates, and calcium channel blockers.  Instructor discusses reasons, side effects, and lifestyle considerations for each drug class.   Anatomy and Physiology of the Circulatory System:  -Group instruction provided by verbal instruction, video, and written materials to support subject.  Reviews functional anatomy of heart, how it relates to various diagnoses, and what role the heart plays in the overall system.   Knowledge Questionnaire Score:     Knowledge Questionnaire Score - 12/23/15 1703    Knowledge Questionnaire Score   Pre Score 22/24      Core Components/Risk Factors/Patient Goals at Admission:     Personal Goals and Risk Factors at Admission - 12/26/15 0657    Core Components/Risk Factors/Patient Goals on Admission   Hypertension Yes   Intervention Provide education on lifestyle modifcations including regular physical activity/exercise, weight management, moderate sodium restriction and increased consumption of fresh fruit, vegetables, and low fat dairy, alcohol moderation, and smoking cessation.;Monitor prescription use compliance.   Expected Outcomes Short Term: Continued assessment and intervention until BP is < 140/20m HG in hypertensive participants. < 130/844mHG in hypertensive participants with diabetes, heart failure or chronic kidney disease.;Long Term: Maintenance of blood pressure at goal levels.   Personal Goal Other Yes   Personal Goal Know what she can do physically. Get back to safe exercise routine.   Intervention Provide individualized home exercise program including frequency, duration, intensity and safe parameters.   Expected Outcomes Patient understands safe parameters for  exercise including appropriate target heart rate, rate of perceived exertion and warning signs/symptoms.      Core Components/Risk Factors/Patient Goals Review:    Core Components/Risk Factors/Patient Goals at Discharge (Final Review):    ITP Comments:     ITP Comments      12/23/15 1342           ITP Comments Medical Director- Dr. TrFransico HimMD          Comments:  Pt is making expected progress toward personal goals after completing 2 sessions. Pt continues to exhibit positive coping skills with no psychosocial needs with no further intervention warranted. Pt has good support system in place.Recommend continued exercise and life style modification education including  stress management and relaxation techniques to decrease cardiac risk profile. Pt is off to a good start. CaCherre HugerBSN

## 2016-01-02 ENCOUNTER — Encounter (HOSPITAL_COMMUNITY)
Admission: RE | Admit: 2016-01-02 | Discharge: 2016-01-02 | Disposition: A | Discharge status: Home / Self Care | Payer: BLUE CROSS/BLUE SHIELD | Source: Ambulatory Visit | Attending: Cardiology | Admitting: Cardiology

## 2016-01-02 DIAGNOSIS — Z9889 Other specified postprocedural states: Secondary | ICD-10-CM

## 2016-01-05 ENCOUNTER — Encounter (HOSPITAL_COMMUNITY)
Admission: RE | Admit: 2016-01-05 | Discharge: 2016-01-05 | Disposition: A | Discharge status: Home / Self Care | Payer: BLUE CROSS/BLUE SHIELD | Source: Ambulatory Visit | Attending: Cardiology | Admitting: Cardiology

## 2016-01-05 DIAGNOSIS — Z9889 Other specified postprocedural states: Secondary | ICD-10-CM | POA: Diagnosis not present

## 2016-01-07 ENCOUNTER — Encounter (HOSPITAL_COMMUNITY)
Admission: RE | Admit: 2016-01-07 | Discharge: 2016-01-07 | Disposition: A | Discharge status: Home / Self Care | Payer: BLUE CROSS/BLUE SHIELD | Source: Ambulatory Visit | Attending: Cardiology | Admitting: Cardiology

## 2016-01-07 DIAGNOSIS — Z9889 Other specified postprocedural states: Secondary | ICD-10-CM

## 2016-01-07 NOTE — Progress Notes (Signed)
Daily Session Note  Patient Details  Name: Cristina Garcia MRN: [id number] Date of Birth: [birth year] Referring Provider:   Flowsheet Row CARDIAC REHAB PHASE II ORIENTATION from 12/23/2015 in [place]  Referring Provider  [name] MD      Encounter Date: 01/07/2016  Check In:     Session Check In - 01/07/16 3838      Check-In   Location [facility]   Staff Present Maurice Small, RN, BSN;Amber Fair, MS, ACSM RCEP, Exercise Physiologist;Joann Rion, RN, BSN   Supervising physician immediately available to respond to emergencies [facility] immediately available   Physician(s) Dr. Marily Memos    Medication changes reported     No   Fall or balance concerns reported    No   Warm-up and Cool-down Performed as group-led instruction   Resistance Training Performed No   VAD Patient? No     Pain Assessment   Currently in Pain? No/denies      Capillary Blood Glucose: No results found for this or any previous visit (from the past 24 hour(s)).   Goals Met:   Goals Unmet:  Not Applicable  Comments:  QUALITY OF LIFE SCORE REVIEW  Pt completed Quality of Life survey as a participant in Cardiac Rehab. Scores 21.0 or below are considered low. Pt scored the following quality of life  Patient quality of life slightly altered by physical constraints which limits ability to perform as prior to recent cardiac illness.      Quality of Life - 12/23/15 1704      Quality of Life Scores   Health/Function Pre 19.46 %   Socioeconomic Pre 24.5 %   Psych/Spiritual Pre 24.21 %   Family Pre 27.6 %   GLOBAL Pre 22.93 %     Pt with issues regarding being able to return to previous work duties due to post surgical recovery and fatigue.  Pt desires to return to "normal" activities but realizes that some things take time. Pt is strongly considering taking a lower position due to the work load of her previous work position. Pt readily  admits she is competitive and has type A personality traits.  Pt is hopeful that participating in cardiac rehab will give her the guidance and restraint to progress back to previous exercise and activities at a more leisure pace. Will continue to monitor and intervene as necessary.    Maurice Small RN, BSN    Dr. Fransico Him is Medical Director for Cardiac Rehab at [facility].

## 2016-01-08 ENCOUNTER — Other Ambulatory Visit: Payer: Self-pay | Admitting: Thoracic Surgery (Cardiothoracic Vascular Surgery)

## 2016-01-09 ENCOUNTER — Ambulatory Visit (INDEPENDENT_AMBULATORY_CARE_PROVIDER_SITE_OTHER): Payer: BLUE CROSS/BLUE SHIELD | Admitting: *Deleted

## 2016-01-09 ENCOUNTER — Encounter (HOSPITAL_COMMUNITY)
Admission: RE | Admit: 2016-01-09 | Discharge: 2016-01-09 | Disposition: A | Discharge status: Home / Self Care | Payer: BLUE CROSS/BLUE SHIELD | Source: Ambulatory Visit | Attending: Cardiology | Admitting: Cardiology

## 2016-01-09 DIAGNOSIS — Z9889 Other specified postprocedural states: Secondary | ICD-10-CM | POA: Diagnosis not present

## 2016-01-09 LAB — POCT INR: INR: 1.7

## 2016-01-09 NOTE — Progress Notes (Signed)
Reviewed home exercise with pt today.  Pt plans to walk for exercise, 2x/week in addition to coming to [facility]. Reviewed THR, pulse, RPE, sign and symptoms, NTG use, and when to call 911 or MD.  Also discussed weather considerations and indoor options.  Pt voiced understanding.     Kareen Jefferys [employer]

## 2016-01-12 ENCOUNTER — Encounter (HOSPITAL_COMMUNITY)
Admission: RE | Admit: 2016-01-12 | Discharge: 2016-01-12 | Disposition: A | Discharge status: Home / Self Care | Payer: BLUE CROSS/BLUE SHIELD | Source: Ambulatory Visit | Attending: Cardiology | Admitting: Cardiology

## 2016-01-12 DIAGNOSIS — Z9889 Other specified postprocedural states: Secondary | ICD-10-CM | POA: Diagnosis not present

## 2016-01-14 ENCOUNTER — Encounter (HOSPITAL_COMMUNITY)
Admission: RE | Admit: 2016-01-14 | Discharge: 2016-01-14 | Disposition: A | Discharge status: Home / Self Care | Payer: BLUE CROSS/BLUE SHIELD | Source: Ambulatory Visit | Attending: Cardiology | Admitting: Cardiology

## 2016-01-14 DIAGNOSIS — Z9889 Other specified postprocedural states: Secondary | ICD-10-CM | POA: Insufficient documentation

## 2016-01-14 DIAGNOSIS — Z954 Presence of other heart-valve replacement: Secondary | ICD-10-CM | POA: Diagnosis not present

## 2016-01-16 ENCOUNTER — Encounter (HOSPITAL_COMMUNITY)
Admission: RE | Admit: 2016-01-16 | Discharge: 2016-01-16 | Disposition: A | Discharge status: Home / Self Care | Payer: BLUE CROSS/BLUE SHIELD | Source: Ambulatory Visit | Attending: Cardiology | Admitting: Cardiology

## 2016-01-16 DIAGNOSIS — Z9889 Other specified postprocedural states: Secondary | ICD-10-CM | POA: Diagnosis not present

## 2016-01-16 DIAGNOSIS — Z954 Presence of other heart-valve replacement: Secondary | ICD-10-CM | POA: Diagnosis not present

## 2016-01-19 ENCOUNTER — Telehealth: Payer: Self-pay | Admitting: Cardiology

## 2016-01-19 ENCOUNTER — Encounter (HOSPITAL_COMMUNITY): Payer: BLUE CROSS/BLUE SHIELD

## 2016-01-19 ENCOUNTER — Telehealth: Payer: Self-pay | Admitting: Family Medicine

## 2016-01-19 NOTE — Telephone Encounter (Signed)
Patient Name: Cristina Garcia DOB: 1960-06-25 Initial Comment Caller states very congested, had heart valve surgery a month ago, coughing up some mucus, asking what she can take Nurse Assessment Nurse: Ronnald Ramp, RN, Miranda Date/Time (Eastern Time): 01/19/2016 12:56:40 PM Confirm and document reason for call. If symptomatic, describe symptoms. You must click the next button to save text entered. ---Caller states she is having sinus drainage and congestion since Saturday. She is wanting to know what she can take since she had mitral valve surgery 3 months ago. Has the patient traveled out of the country within the last 30 days? ---No Does the patient have any new or worsening symptoms? ---Yes Will a triage be completed? ---Yes Related visit to physician within the last 2 weeks? ---No Does the PT have any chronic conditions? (i.e. diabetes, asthma, etc.) ---Yes List chronic conditions. ---HTN, Allergies Is this a behavioral health or substance abuse call? ---No Guidelines Guideline Title Affirmed Question Affirmed Notes Common Cold Cold with no complications (all triage questions negative) Final Disposition [place], RN, Miranda Comments Told caller that Coricidin HBP is usually recommended for patients that have HTN. Suggested since she had recently had heart surgery that she contact her cardiology office to verify. Reinforced that most effective home care is saline spray or netipot. Disagree/Comply: Comply

## 2016-01-19 NOTE — Telephone Encounter (Signed)
Please Advise

## 2016-01-19 NOTE — Telephone Encounter (Signed)
New message   Pt is calling to see what she can take over the counter for congestion

## 2016-01-19 NOTE — Telephone Encounter (Signed)
Pt advised Dr Aundra Dubin recommends chlor-trimeton prn for nasal congestion, avoid decongestants.

## 2016-01-21 ENCOUNTER — Encounter (HOSPITAL_COMMUNITY): Payer: BLUE CROSS/BLUE SHIELD

## 2016-01-22 ENCOUNTER — Encounter: Payer: Self-pay | Admitting: Cardiology

## 2016-01-23 ENCOUNTER — Encounter (HOSPITAL_COMMUNITY): Payer: BLUE CROSS/BLUE SHIELD

## 2016-01-26 ENCOUNTER — Encounter (HOSPITAL_COMMUNITY): Payer: BLUE CROSS/BLUE SHIELD

## 2016-01-28 ENCOUNTER — Encounter (HOSPITAL_COMMUNITY): Payer: BLUE CROSS/BLUE SHIELD

## 2016-01-29 NOTE — Progress Notes (Signed)
Cardiac Individual Treatment Plan  Patient Details  Name: Cristina Garcia MRN: [id number] Date of Birth: Nov 22, 1960 Referring Provider:   Flowsheet Row CARDIAC REHAB PHASE II ORIENTATION from 12/23/2015 in [facility]  Referring Provider  [name] MD      Initial Encounter Date:  [place] PHASE II ORIENTATION from 12/23/2015 in [facility]  Date  12/23/15  Referring Provider  [name] MD      Visit Diagnosis: S/P mitral valve repair  Patient's Home Medications on Admission:  Current Outpatient Prescriptions:  .  acyclovir (ZOVIRAX) 200 MG capsule, Take 200 mg by mouth daily as needed. Reported on 12/05/2015, Disp: , Rfl:  .  aspirin 81 MG tablet, Take 81 mg by mouth daily. Reported on 10/08/2015, Disp: , Rfl:  .  calcium carbonate (OS-CAL) 600 MG TABS, Take 600 mg by mouth daily with breakfast. , Disp: , Rfl:  .  Estrogens, Conjugated (PREMARIN VA), Place vaginally daily as needed. , Disp: , Rfl:  .  ferrous sulfate 325 (65 FE) MG tablet, Take 325 mg by mouth daily., Disp: , Rfl:  .  furosemide (LASIX) 20 MG tablet, TAKE 1 TABLET (20 MG TOTAL) BY MOUTH DAILY., Disp: 30 tablet, Rfl: 1 .  lisinopril (PRINIVIL,ZESTRIL) 5 MG tablet, Take 1 tablet (5 mg total) by mouth daily., Disp: 30 tablet, Rfl: 6 .  loratadine (CLARITIN) 10 MG tablet, Take 10 mg by mouth daily., Disp: , Rfl:  .  metoprolol succinate (TOPROL-XL) 25 MG 24 hr tablet, TAKE 1 TABLET (25 MG TOTAL) BY MOUTH DAILY., Disp: 90 tablet, Rfl: 1 .  Multiple Vitamins-Minerals (MULTIVITAL PO), Take 1 tablet by mouth daily.  , Disp: , Rfl:  .  warfarin (COUMADIN) 5 MG tablet, Take 1 tablet (5 mg total) by mouth daily at 6 PM. And as prescribed by the coumadin clinic (Patient taking differently: Take 5-7.5 mg by mouth daily at 6 PM. And as prescribed by the coumadin clinic), Disp: 100 tablet, Rfl: 1  Past Medical History: Past Medical History:   Diagnosis Date  . Allergy   . Anxiety   . Atrial tachycardia ([place]) 12/18/2014   24 hour event monitor with frequent PAC's, PVC's and short runs of atrial tachycardia - no sustained arrhythmias or atrial fibrillation   . Chronic kidney disease (CKD)   . Constipation   . H/O multiple myeloma    clinical remission  . Hypertension   . Mitral regurgitation   . Mitral valve prolapse   . Osteoporosis   . Pleural effusion, right 11/03/2015  . S/P minimally invasive mitral valve repair 10/15/2015   Complex valvuloplasty including Alfieri edge-to-edge repair, generous quadrangular resection (shortening) of posterior leaflet, sliding leaflet plasty, Gore-tex neochord placement x6, and 38 mm Edwards Physio II ring annuloplasty via right mini thoracotomy approach     Tobacco Use: History  Smoking Status  . Never Smoker  Smokeless Tobacco  . Never Used    Labs: Recent Review Flowsheet Data    Labs for ITP Cardiac and Pulmonary Rehab Latest Ref Rng & Units 10/15/2015 10/15/2015 10/16/2015 10/16/2015 10/16/2015   Cholestrol 0 - 200 mg/dL - - - - -   LDLCALC 0 - 99 mg/dL - - - - -   LDLDIRECT mg/dL - - - - -   HDL >39.00 mg/dL - - - - -   Trlycerides 0.0 - 149.0 mg/dL - - - - -   Hemoglobin A1c 4.8 -  5.6 % - - - - -   PHART 7.350 - 7.450 - 7.291(L) 7.283(L) 7.318(L) -   PCO2ART 35.0 - 45.0 mmHg - 41.2 41.1 42.3 -   HCO3 20.0 - 24.0 mEq/L - 20.0 19.6(L) 21.9 -   TCO2 0 - 100 mmol/L _0 ACIDBASEDEF 0.0 - 2.0 mmol/L - 6.0(H) 7.0(H) 4.0(H) -   O2SAT % - 99.0 92.0 96.0 -      Capillary Blood Glucose: Lab Results  Component Value Date   GLUCAP 158 (H) 10/17/2015   GLUCAP 179 (H) 10/17/2015   GLUCAP 117 (H) 10/17/2015   GLUCAP 80 10/17/2015   GLUCAP 96 10/17/2015     Exercise Target Goals:    Exercise Program Goal: Individual exercise prescription set with THRR, safety & activity barriers. Participant demonstrates ability to understand and report RPE using BORG scale, to  self-measure pulse accurately, and to acknowledge the importance of the exercise prescription.  Exercise Prescription Goal: Starting with aerobic activity 30 plus minutes a day, 3 days per week for initial exercise prescription. Provide home exercise prescription and guidelines that participant acknowledges understanding prior to discharge.  Activity Barriers & Risk Stratification:     Activity Barriers & Cardiac Risk Stratification - 12/23/15 1703      Activity Barriers & Cardiac Risk Stratification   Activity Barriers None   Cardiac Risk Stratification Moderate      6 Minute Walk:     6 Minute Walk    Row Name 12/23/15 1700         6 Minute Walk   Phase Initial     Distance 1767 feet     Walk Time 6 minutes     # of Rest Breaks 0     MPH 3.35     METS 4.96     RPE 11     VO2 Peak 17.36     Symptoms No     Resting HR 99 bpm     Resting BP 104/60     Max Ex. HR 110 bpm     Max Ex. BP 104/60     2 Minute Post BP 98/62        Initial Exercise Prescription:     Initial Exercise Prescription - 12/23/15 1700      Date of Initial Exercise RX and Referring Provider   Date 12/23/15   Referring Provider [name] MD     Treadmill   MPH 2.5   Grade 1   Minutes 10   METs 3.26     Bike   Level 1.2   Minutes 10   METs 4.77     NuStep   Level 3   Minutes 10   METs 2.8     Prescription Details   Frequency (times per week) 3   Duration Progress to 30 minutes of continuous aerobic without signs/symptoms of physical distress     Intensity   THRR 40-80% of Max Heartrate 66-132   Ratings of Perceived Exertion 11-13   Perceived Dyspnea 0-4      Perform Capillary Blood Glucose checks as needed.  Exercise Prescription Changes:     Exercise Prescription Changes    Row Name 01/07/16 1000 01/23/16 1000           Exercise Review   Progression Yes Yes        Response to Exercise   Blood Pressure (Admit) 90/62 94/70  asymptomatic      Blood  Pressure (Exercise) 106/60 100/62      Blood Pressure (Exit) 95/68 103/69      Heart Rate (Admit) 88 bpm 99 bpm      Heart Rate (Exercise) 121 bpm 134 bpm      Heart Rate (Exit) 103 bpm 101 bpm      Rating of Perceived Exertion (Exercise) 12 13      Duration Progress to 30 minutes of continuous aerobic without signs/symptoms of physical distress Progress to 30 minutes of continuous aerobic without signs/symptoms of physical distress      Intensity THRR unchanged THRR unchanged        Progression   Progression Continue to progress workloads to maintain intensity without signs/symptoms of physical distress. Continue to progress workloads to maintain intensity without signs/symptoms of physical distress.      Average METs 3.5 4.8        Resistance Training   Training Prescription Yes Yes      Weight 1lb 2lbs      Reps 10-12 10-12        Interval Training   Interval Training No No        Treadmill   MPH 2.5 3.2      Grade 1 4      Minutes 10 10      METs 3.26 5.21        Bike   Level 1.2 1.2      Minutes 10 10      METs 4.77 4.77        NuStep   Level 3 4      Minutes 10 10      METs 3.6 4.4        Home Exercise Plan   Plans to continue exercise at  - Home  reviewed HEP on 7/28 see progress note      Frequency  - Add 3 additional days to program exercise sessions.         Exercise Comments:     Exercise Comments    Row Name 01/23/16 1043           Exercise Comments Pt is tolerating exercise well; Will continue to monitor exercise progression ( pt was out 01/19/16-811/17 due to being sick          Discharge Exercise Prescription (Final Exercise Prescription Changes):     Exercise Prescription Changes - 01/23/16 1000      Exercise Review   Progression Yes     Response to Exercise   Blood Pressure (Admit) 94/70  asymptomatic   Blood Pressure (Exercise) 100/62   Blood Pressure (Exit) 103/69   Heart Rate (Admit) 99 bpm   Heart Rate (Exercise) 134 bpm    Heart Rate (Exit) 101 bpm   Rating of Perceived Exertion (Exercise) 13   Duration Progress to 30 minutes of continuous aerobic without signs/symptoms of physical distress   Intensity THRR unchanged     Progression   Progression Continue to progress workloads to maintain intensity without signs/symptoms of physical distress.   Average METs 4.8     Resistance Training   Training Prescription Yes   Weight 2lbs   Reps 10-12     Interval Training   Interval Training No     Treadmill   MPH 3.2   Grade 4   Minutes 10   METs 5.21     Bike   Level 1.2   Minutes 10   METs 4.77     NuStep  Level 4   Minutes 10   METs 4.4     Home Exercise Plan   Plans to continue exercise at Home  reviewed HEP on 7/28 see progress note   Frequency Add 3 additional days to program exercise sessions.      Nutrition:  Target Goals: Understanding of nutrition guidelines, daily intake of sodium <1570m, cholesterol <2028m calories 30% from fat and 7% or less from saturated fats, daily to have 5 or more servings of fruits and vegetables.  Biometrics:     Pre Biometrics - 12/23/15 1702      Pre Biometrics   Waist Circumference 25.5 inches   Hip Circumference 36.5 inches   Waist to Hip Ratio 0.7 %   Triceps Skinfold 26 mm   % Body Fat 29.2 %   Grip Strength 27 kg   Flexibility 13 in   Single Leg Stand 30 seconds       Nutrition Therapy Plan and Nutrition Goals:     Nutrition Therapy & Goals - 01/08/16 1417      Nutrition Therapy   Diet Therapeutic Lifestyle Changes   Drug/Food Interactions Coumadin/Vit K     Personal Nutrition Goals   Personal Goal #1 Maintain weight while in Cardiac Rehab     Intervention Plan   Intervention Prescribe, educate and counsel regarding individualized specific dietary modifications aiming towards targeted core components such as weight, hypertension, lipid management, diabetes, heart failure and other comorbidities.   Expected Outcomes Short Term  Goal: Understand basic principles of dietary content, such as calories, fat, sodium, cholesterol and nutrients.;Long Term Goal: Adherence to prescribed nutrition plan.      Nutrition Discharge: Nutrition Scores:     Nutrition Assessments - 01/08/16 1417      MEDFICTS Scores   Pre Score 63  will verify score with pt      Nutrition Goals Re-Evaluation:   Psychosocial: Target Goals: Acknowledge presence or absence of depression, maximize coping skills, provide positive support system. Participant is able to verbalize types and ability to use techniques and skills needed for reducing stress and depression.  Initial Review & Psychosocial Screening:     Initial Psych Review & Screening - 12/25/15 [id number]     Barriers   Psychosocial barriers to participate in program There are no identifiable barriers or psychosocial needs.;The patient should benefit from training in stress management and relaxation.     Screening Interventions   Interventions Encouraged to exercise      Quality of Life Scores:     Quality of Life - 12/23/15 1704      Quality of Life Scores   Health/Function Pre 19.46 %   Socioeconomic Pre 24.5 %   Psych/Spiritual Pre 24.21 %   Family Pre 27.6 %   GLOBAL Pre 22.93 %      PHQ-9: Recent Review Flowsheet Data    Depression screen PHQ 2/9 12/29/2015   Decreased Interest 0   Down, Depressed, Hopeless 1   PHQ - 2 Score 1      Psychosocial Evaluation and Intervention:   Psychosocial Re-Evaluation:   Vocational Rehabilitation: Provide vocational rehab assistance to qualifying candidates.   Vocational Rehab Evaluation & Intervention:     Vocational Rehab - 12/25/15 1141      Initial Vocational Rehab Evaluation & Intervention   Assessment shows need for Vocational Rehabilitation No      Education: Education Goals: Education classes will be  provided on a weekly basis, covering required topics.  Participant will state understanding/return demonstration of topics presented.  Learning Barriers/Preferences:     Learning Barriers/Preferences - 12/25/15 1140      Learning Barriers/Preferences   Learning Barriers Hearing;Sight   Learning Preferences Written Material      Education Topics: Count Your Pulse:  -Group instruction provided by verbal instruction, demonstration, patient participation and written materials to support subject.  Instructors address importance of being able to find your pulse and how to count your pulse when at home without a heart monitor.  Patients get hands on experience counting their pulse with staff help and individually.   Heart Attack, Angina, and Risk Factor Modification:  -Group instruction provided by verbal instruction, video, and written materials to support subject.  Instructors address signs and symptoms of angina and heart attacks.    Also discuss risk factors for heart disease and how to make changes to improve heart health risk factors.   Functional Fitness:  -Group instruction provided by verbal instruction, demonstration, patient participation, and written materials to support subject.  Instructors address safety measures for doing things around the house.  Discuss how to get up and down off the floor, how to pick things up properly, how to safely get out of a chair without assistance, and balance training.   Meditation and Mindfulness:  -Group instruction provided by verbal instruction, patient participation, and written materials to support subject.  Instructor addresses importance of mindfulness and meditation practice to help reduce stress and improve awareness.  Instructor also leads participants through a meditation exercise.    Stretching for Flexibility and Mobility:  -Group instruction provided by verbal instruction, patient participation, and written materials to support subject.  Instructors lead participants through series of  stretches that are designed to increase flexibility thus improving mobility.  These stretches are additional exercise for major muscle groups that are typically performed during regular warm up and cool down.   Hands Only CPR Anytime:  -Group instruction provided by verbal instruction, video, patient participation and written materials to support subject.  Instructors co-teach with AHA video for hands only CPR.  Participants get hands on experience with mannequins.   Nutrition I class: Heart Healthy Eating:  -Group instruction provided by PowerPoint slides, verbal discussion, and written materials to support subject matter. The instructor gives an explanation and review of the Therapeutic Lifestyle Changes diet recommendations, which includes a discussion on lipid goals, dietary fat, sodium, fiber, plant stanol/sterol esters, sugar, and the components of a well-balanced, healthy diet.   Nutrition II class: Lifestyle Skills:  -Group instruction provided by PowerPoint slides, verbal discussion, and written materials to support subject matter. The instructor gives an explanation and review of label reading, grocery shopping for heart health, heart healthy recipe modifications, and ways to make healthier choices when eating out.   Diabetes Question & Answer:  -Group instruction provided by PowerPoint slides, verbal discussion, and written materials to support subject matter. The instructor gives an explanation and review of diabetes co-morbidities, pre- and post-prandial blood glucose goals, pre-exercise blood glucose goals, signs, symptoms, and treatment of hypoglycemia and hyperglycemia, and foot care basics.   Diabetes Blitz:  -Group instruction provided by PowerPoint slides, verbal discussion, and written materials to support subject matter. The instructor gives an explanation and review of the physiology behind type 1 and type 2 diabetes, diabetes medications and rational behind using different  medications, pre- and post-prandial blood glucose recommendations and Hemoglobin A1c goals, diabetes diet, and exercise  including blood glucose guidelines for exercising safely.    Portion Distortion:  -Group instruction provided by PowerPoint slides, verbal discussion, written materials, and food models to support subject matter. The instructor gives an explanation of serving size versus portion size, changes in portions sizes over the last 20 years, and what consists of a serving from each food group.   Stress Management:  -Group instruction provided by verbal instruction, video, and written materials to support subject matter.  Instructors review role of stress in heart disease and how to cope with stress positively.     Exercising on Your Own:  -Group instruction provided by verbal instruction, power point, and written materials to support subject.  Instructors discuss benefits of exercise, components of exercise, frequency and intensity of exercise, and end points for exercise.  Also discuss use of nitroglycerin and activating EMS.  Review options of places to exercise outside of rehab.  Review guidelines for sex with heart disease.   Cardiac Drugs I:  -Group instruction provided by verbal instruction and written materials to support subject.  Instructor reviews cardiac drug classes: antiplatelets, anticoagulants, beta blockers, and statins.  Instructor discusses reasons, side effects, and lifestyle considerations for each drug class.   Cardiac Drugs II:  -Group instruction provided by verbal instruction and written materials to support subject.  Instructor reviews cardiac drug classes: angiotensin converting enzyme inhibitors (ACE-I), angiotensin II receptor blockers (ARBs), nitrates, and calcium channel blockers.  Instructor discusses reasons, side effects, and lifestyle considerations for each drug class.   Anatomy and Physiology of the Circulatory System:  -Group instruction provided  by verbal instruction, video, and written materials to support subject.  Reviews functional anatomy of heart, how it relates to various diagnoses, and what role the heart plays in the overall system.   Knowledge Questionnaire Score:     Knowledge Questionnaire Score - 12/23/15 1703      Knowledge Questionnaire Score   Pre Score 22/24      Core Components/Risk Factors/Patient Goals at Admission:     Personal Goals and Risk Factors at Admission - 12/26/15 0657      Core Components/Risk Factors/Patient Goals on Admission   Hypertension Yes   Intervention Provide education on lifestyle modifcations including regular physical activity/exercise, weight management, moderate sodium restriction and increased consumption of fresh fruit, vegetables, and low fat dairy, alcohol moderation, and smoking cessation.;Monitor prescription use compliance.   Expected Outcomes Short Term: Continued assessment and intervention until BP is < 140/45m HG in hypertensive participants. < 130/868mHG in hypertensive participants with diabetes, heart failure or chronic kidney disease.;Long Term: Maintenance of blood pressure at goal levels.   Personal Goal Other Yes   Personal Goal Know what she can do physically. Get back to safe exercise routine.   Intervention Provide individualized home exercise program including frequency, duration, intensity and safe parameters.   Expected Outcomes Patient understands safe parameters for exercise including appropriate target heart rate, rate of perceived exertion and warning signs/symptoms.      Core Components/Risk Factors/Patient Goals Review:    Core Components/Risk Factors/Patient Goals at Discharge (Final Review):    ITP Comments:     ITP Comments    Row Name 12/23/15 1342           ITP Comments Medical Director- Dr. TrFransico HimMD          Comments: Pt is making expected progress toward personal goals after completing 9 sessions.Pt absent last 2  weeks due to head cold. Recommend continued exercise  and life style modification education including  stress management and relaxation techniques to decrease cardiac risk profile.  Psychosocial Assessment remains unchanged from previous assessment, continues to work on work related stressors. Pt has made advances toward changing job duties to be in a less stressful environment.  Pt has supportive family with no psychosocial needs, no further intervention warranted at this time. Maurice Small RN

## 2016-01-30 ENCOUNTER — Encounter (HOSPITAL_COMMUNITY): Payer: BLUE CROSS/BLUE SHIELD

## 2016-01-30 ENCOUNTER — Ambulatory Visit (INDEPENDENT_AMBULATORY_CARE_PROVIDER_SITE_OTHER): Payer: BLUE CROSS/BLUE SHIELD | Admitting: Cardiology

## 2016-01-30 ENCOUNTER — Encounter: Payer: Self-pay | Admitting: Cardiology

## 2016-01-30 VITALS — BP 90/62 | HR 103 | Ht 69.0 in | Wt 129.8 lb

## 2016-01-30 DIAGNOSIS — I420 Dilated cardiomyopathy: Secondary | ICD-10-CM

## 2016-01-30 DIAGNOSIS — J329 Chronic sinusitis, unspecified: Secondary | ICD-10-CM

## 2016-01-30 DIAGNOSIS — Z9889 Other specified postprocedural states: Secondary | ICD-10-CM

## 2016-01-30 DIAGNOSIS — I340 Nonrheumatic mitral (valve) insufficiency: Secondary | ICD-10-CM

## 2016-01-30 MED ORDER — AMOXICILLIN-POT CLAVULANATE 875-125 MG PO TABS: 1.0000 | ORAL_TABLET | Freq: Two times a day (BID) | ORAL | 0 refills | Status: DC

## 2016-01-30 NOTE — Patient Instructions (Signed)
Medication Instructions:  Take Augmentin 875-125mg  two times a day for 10 days for sinusitis.  Labwork: None  Testing/Procedures: None   Follow-Up: Your physician recommends that you schedule a follow-up appointment in: 2 months with Dr Aundra Dubin.       If you need a refill on your cardiac medications before your next appointment, please call your pharmacy.

## 2016-01-31 NOTE — Progress Notes (Signed)
Patient ID: Cristina Garcia, female   DOB: August 27, 1960, 55 y.o.   MRN: [id number] PCP: Dr. Sherren Mocha  55 yo with mitral valve prolapse and mitral regurgitation presents for cardiology evaluation.  Patient has had a long history of murmur and has been told for years that she has mitral valve prolapse.  No history of this in her family though her mother had rheumatic fever and a mitral valve operation.  Her PCP sent her for an echo in 1/15, which showed bileaflet prolapse and probably moderate MR.  TEE was recommended.  This was done in 3/15 and showed moderate to severe MR from bileaflet mitral valve prolapse with normal LV size and no pulmonary vein systolic doppler flow reversal.  She had repeat echo in 6/16.  It continued to show moderate to severe mid to late systolic mitral regurgitation in the setting of MVP.  EF 60%, mild LV dilation, moderate LAE.  Echo in 2/17 showed severe MR due to MVP.    Last winter, she began to develop exertional dyspnea.  I was concerned that this came from her mitral valve.  TEE confirmed severe MR from bileaflet prolapse. LHC showed no significant CAD.  In 5/17, she had a complex minimally invasive mitral valve repair by Dr Roxy Manns.  She had Alfieri edge-to-edge repair and annuloplasty.  Post-operatively, she required a right thoracentesis.  Echo in 6/17 (post-op) showed EF 45%, mild LV dilation, stable repaired mitral valve with no MR or MS.  PASP 48 mmHg.   She is generally doing well.  Back to work now.  Mild dyspnea walking up steps, otherwise doing ok.  Main complaint has been sinus congestion that has been present for about 2 wks now with facial pain. No fever.  SBP has been running in 90s-100s on lisinopril + Toprol XL.  She takes both in the morning. No palpitations.   She is doing cardiac rehab.  Very mild dyspnea with stairs otherwise ok.   Labs (2/15): creatinine 1.18 Labs (10/16): K 4, creatinine 1.3 Labs (5/17): K 3.8, creatinine 1.13, plts 101, hgb 8 Labs (7/17): K  4.2, creatinine 1.16  ECG: sinus tachy 103, rSR' V1.   PMH: 1. Mitral valve prolapse: Echo (1/15) with EF 55-60%, grade II diastolic dysfunction, bileaflet MV prolapse, moderate MR.  TEE (3/15) with EF 60%, normal RV size and systolic function, bileaflet MV prolapse, moderate to severe MR (moderate by PISA), no pulmonary vein systolic flow reversal.  Echo (6/16) with EF 60%, MVP with moderate-severe mid to late systolic MR, mild LV dilation (53 mm EDD), grade II diastolic dysfunction, moderate LAE. Echo (2/17) with EF 55-60%, mild LV dilation (56 mm EDD), grade II diastolic dysfunction, moderate LAE, bileaflet MVP with severe MR, normal RV size and systolic function, unable to estimate PA pressure.  - Echo (6/17) post-op with EF 45%, mild LV dilation, s/p MV repair with no significant regurgitation or stenosis, PASP 48 mmHg.  - S/p mitral valve repair in 5/17.   2. Multiple myeloma: s/p chemotherapy and autologous stem cell transplant at [facility] in 6/08, clinical remission.  3. CKD 2/2 multiple myeloma 4. H/o rectal bleeding with negative colonoscopy in 3/13.  5. Osteopenia 6. Atrial tachycardia: 30 day monitor in 8/16 with atrial tachycardia, no atrial fibrillation.  7. ETT (8/16) with 8'56" exercise, no ischemia, frequent PACs/PVCs.  LHC (3/17) showed no CAD.    SH: Married, nonsmoker, works for [employer].  2 kids.   FH: HTN, lymphoma, diabetes. Mother with rheumatic  fever and had MV replacement.   ROS: All systems reviewed and negative except as per HPI.   Current Outpatient Prescriptions  Medication Sig Dispense Refill  . acyclovir (ZOVIRAX) 200 MG capsule Take 200 mg by mouth daily as needed (BREAKOUTS). Reported on 12/05/2015    . aspirin 81 MG tablet Take 81 mg by mouth daily. Reported on 10/08/2015    . calcium carbonate (OS-CAL) 600 MG TABS Take 600 mg by mouth daily with breakfast.     . Estrogens, Conjugated (PREMARIN VA) Place 1 application vaginally daily as needed  (dryness).     . ferrous sulfate 325 (65 FE) MG tablet Take 325 mg by mouth daily.    Marland Kitchen lisinopril (PRINIVIL,ZESTRIL) 5 MG tablet Take 1 tablet (5 mg total) by mouth daily. 30 tablet 6  . loratadine (CLARITIN) 10 MG tablet Take 10 mg by mouth daily.    . metoprolol succinate (TOPROL-XL) 25 MG 24 hr tablet TAKE 1 TABLET (25 MG TOTAL) BY MOUTH DAILY. 90 tablet 1  . Multiple Vitamins-Minerals (MULTIVITAL PO) Take 1 tablet by mouth daily.      Marland Kitchen amoxicillin-clavulanate (AUGMENTIN) 875-125 MG tablet Take 1 tablet by mouth 2 (two) times daily. 20 tablet 0   No current facility-administered medications for this visit.     BP 90/62   Pulse (!) 103   Ht '5\' 9"'  (1.753 m)   Wt 129 lb 12.8 oz (58.9 kg)   BMI 19.17 kg/m  General: NAD Neck: No JVD, no thyromegaly or thyroid nodule.  Lungs: Mildly decreased breath sounds right base.  CV: Nondisplaced PMI.  Heart regular S1/S2, no S3/S4, no murmur.  No peripheral edema.  No carotid bruit.  Normal pedal pulses.  Abdomen: Soft, nontender, no hepatosplenomegaly, no distention.  Skin: Intact without lesions or rashes.  Neurologic: Alert and oriented x 3.  Psych: Normal affect. Extremities: No clubbing or cyanosis.   Assessment/Plan: 1. Mitral regurgitation: Now s/p mitral valve repair.  Post-op echo shows no significant regurgitation or stenosis, but EF was down to 45%.  I suspect EF before appeared artificially high due to the severe MR.   - Continue ASA 81 daily post-MV repair.  She will need antibiotic prophylaxis with dental work.  - Continue cardiac rehab.  2. Cardiomyopathy: Mildly decreased EF, likely due to mitral regurgitation.   - Continue Toprol XL and lisinopril.  Given low BP and fatigue, would switch around meds so that she is taking lisinopril in pm and Toprol XL in am.   3. Sinusitis: Sinusitis symptoms for 2 wks now.  I will give her a course of Augmentin.   [name] 01/31/2016

## 2016-02-02 ENCOUNTER — Encounter (HOSPITAL_COMMUNITY): Payer: BLUE CROSS/BLUE SHIELD

## 2016-02-04 ENCOUNTER — Encounter (HOSPITAL_COMMUNITY): Payer: BLUE CROSS/BLUE SHIELD

## 2016-02-06 ENCOUNTER — Encounter (HOSPITAL_COMMUNITY)
Admission: RE | Admit: 2016-02-06 | Discharge: 2016-02-06 | Disposition: A | Discharge status: Home / Self Care | Payer: BLUE CROSS/BLUE SHIELD | Source: Ambulatory Visit

## 2016-02-06 ENCOUNTER — Telehealth (HOSPITAL_COMMUNITY): Payer: Self-pay | Admitting: *Deleted

## 2016-02-06 DIAGNOSIS — Z9889 Other specified postprocedural states: Secondary | ICD-10-CM

## 2016-02-06 NOTE — Telephone Encounter (Signed)
Called pt to inquire ability to return to exercise.  Pt cold is better with new medication.  Pt plans to return to exercise on Monday. Cherre Huger, BSN

## 2016-02-09 ENCOUNTER — Encounter (HOSPITAL_COMMUNITY)
Admission: RE | Admit: 2016-02-09 | Discharge: 2016-02-09 | Disposition: A | Discharge status: Home / Self Care | Payer: BLUE CROSS/BLUE SHIELD | Source: Ambulatory Visit | Attending: Cardiology | Admitting: Cardiology

## 2016-02-09 DIAGNOSIS — Z9889 Other specified postprocedural states: Secondary | ICD-10-CM | POA: Diagnosis not present

## 2016-02-09 DIAGNOSIS — Z954 Presence of other heart-valve replacement: Secondary | ICD-10-CM | POA: Diagnosis not present

## 2016-02-11 ENCOUNTER — Encounter (HOSPITAL_COMMUNITY)
Admission: RE | Admit: 2016-02-11 | Discharge: 2016-02-11 | Disposition: A | Discharge status: Home / Self Care | Payer: BLUE CROSS/BLUE SHIELD | Source: Ambulatory Visit | Attending: Cardiology | Admitting: Cardiology

## 2016-02-11 DIAGNOSIS — Z954 Presence of other heart-valve replacement: Secondary | ICD-10-CM | POA: Diagnosis not present

## 2016-02-11 DIAGNOSIS — Z9889 Other specified postprocedural states: Secondary | ICD-10-CM

## 2016-02-13 ENCOUNTER — Encounter (HOSPITAL_COMMUNITY)
Admission: RE | Admit: 2016-02-13 | Discharge: 2016-02-13 | Disposition: A | Discharge status: Home / Self Care | Payer: BLUE CROSS/BLUE SHIELD | Source: Ambulatory Visit | Attending: Cardiology | Admitting: Cardiology

## 2016-02-13 DIAGNOSIS — Z954 Presence of other heart-valve replacement: Secondary | ICD-10-CM | POA: Insufficient documentation

## 2016-02-13 DIAGNOSIS — Z9889 Other specified postprocedural states: Secondary | ICD-10-CM | POA: Diagnosis not present

## 2016-02-18 ENCOUNTER — Encounter (HOSPITAL_COMMUNITY)
Admission: RE | Admit: 2016-02-18 | Discharge: 2016-02-18 | Disposition: A | Discharge status: Home / Self Care | Payer: BLUE CROSS/BLUE SHIELD | Source: Ambulatory Visit | Attending: Cardiology | Admitting: Cardiology

## 2016-02-18 DIAGNOSIS — Z954 Presence of other heart-valve replacement: Secondary | ICD-10-CM | POA: Diagnosis not present

## 2016-02-18 DIAGNOSIS — Z9889 Other specified postprocedural states: Secondary | ICD-10-CM | POA: Diagnosis not present

## 2016-02-20 ENCOUNTER — Encounter (HOSPITAL_COMMUNITY)
Admission: RE | Admit: 2016-02-20 | Discharge: 2016-02-20 | Disposition: A | Discharge status: Home / Self Care | Payer: BLUE CROSS/BLUE SHIELD | Source: Ambulatory Visit | Attending: Cardiology | Admitting: Cardiology

## 2016-02-20 DIAGNOSIS — Z9889 Other specified postprocedural states: Secondary | ICD-10-CM

## 2016-02-20 DIAGNOSIS — Z954 Presence of other heart-valve replacement: Secondary | ICD-10-CM | POA: Diagnosis not present

## 2016-02-23 ENCOUNTER — Encounter (HOSPITAL_COMMUNITY)
Admission: RE | Admit: 2016-02-23 | Discharge: 2016-02-23 | Disposition: A | Discharge status: Home / Self Care | Payer: BLUE CROSS/BLUE SHIELD | Source: Ambulatory Visit | Attending: Cardiology | Admitting: Cardiology

## 2016-02-23 DIAGNOSIS — Z954 Presence of other heart-valve replacement: Secondary | ICD-10-CM | POA: Diagnosis not present

## 2016-02-23 DIAGNOSIS — Z9889 Other specified postprocedural states: Secondary | ICD-10-CM | POA: Diagnosis not present

## 2016-02-25 ENCOUNTER — Encounter (HOSPITAL_COMMUNITY)
Admission: RE | Admit: 2016-02-25 | Discharge: 2016-02-25 | Disposition: A | Discharge status: Home / Self Care | Payer: BLUE CROSS/BLUE SHIELD | Source: Ambulatory Visit | Attending: Cardiology | Admitting: Cardiology

## 2016-02-25 DIAGNOSIS — Z9889 Other specified postprocedural states: Secondary | ICD-10-CM

## 2016-02-25 DIAGNOSIS — Z954 Presence of other heart-valve replacement: Secondary | ICD-10-CM | POA: Diagnosis not present

## 2016-02-25 NOTE — Progress Notes (Signed)
Colin Ina 55 y.o. female Nutrition Note Spoke with pt.  Nutrition Survey reviewed with pt. Pt is following Step 1 of the Therapeutic Lifestyle Changes diet. Pt expressed understanding of the information reviewed. Pt aware of nutrition education classes offered and is unable to attend nutrition classes due to work schedule. Lab Results  Component Value Date   HGBA1C 5.1 10/10/2015   Wt Readings from Last 3 Encounters:  01/30/16 129 lb 12.8 oz (58.9 kg)  12/29/15 131 lb (59.4 kg)  12/23/15 130 lb 8.2 oz (59.2 kg)   Nutrition Diagnosis ? Food-and nutrition-related knowledge deficit related to lack of exposure to information as related to diagnosis of: ? CVD  Nutrition Intervention ? Benefits of adopting Therapeutic Lifestyle Changes discussed when Medficts reviewed. ? Pt to attend the Portion Distortion class ? Pt given handouts for: ? Nutrition I class ? Nutrition II class ? Continue client-centered nutrition education by RD, as part of interdisciplinary care.  Goal(s) ? Pt to describe the benefit of including fruits, vegetables, whole grains, and low-fat dairy products in a heart healthy meal plan.  Monitor and Evaluate progress toward nutrition goal with team.  Derek Mound, M.Ed, RD, LDN, CDE 02/25/2016 8:01 AM

## 2016-02-26 NOTE — Progress Notes (Signed)
Cardiac Individual Treatment Plan  Patient Details  Name: Cristina Garcia MRN: [id number] Date of Birth: Nov 06, 1960 Referring Provider:   Flowsheet Row CARDIAC REHAB PHASE II ORIENTATION from 12/23/2015 in [place]  Referring Provider  [name] MD      Initial Encounter Date:  [place] PHASE II ORIENTATION from 12/23/2015 in [place]  Date  12/23/15  Referring Provider  [name] MD      Visit Diagnosis: S/P mitral valve repair  Patient's Home Medications on Admission:  Current Outpatient Prescriptions:  .  acyclovir (ZOVIRAX) 200 MG capsule, Take 200 mg by mouth daily as needed (BREAKOUTS). Reported on 12/05/2015, Disp: , Rfl:  .  amoxicillin-clavulanate (AUGMENTIN) 875-125 MG tablet, Take 1 tablet by mouth 2 (two) times daily., Disp: 20 tablet, Rfl: 0 .  aspirin 81 MG tablet, Take 81 mg by mouth daily. Reported on 10/08/2015, Disp: , Rfl:  .  calcium carbonate (OS-CAL) 600 MG TABS, Take 600 mg by mouth daily with breakfast. , Disp: , Rfl:  .  Estrogens, Conjugated (PREMARIN VA), Place 1 application vaginally daily as needed (dryness). , Disp: , Rfl:  .  ferrous sulfate 325 (65 FE) MG tablet, Take 325 mg by mouth daily., Disp: , Rfl:  .  lisinopril (PRINIVIL,ZESTRIL) 5 MG tablet, Take 1 tablet (5 mg total) by mouth daily., Disp: 30 tablet, Rfl: 6 .  loratadine (CLARITIN) 10 MG tablet, Take 10 mg by mouth daily., Disp: , Rfl:  .  metoprolol succinate (TOPROL-XL) 25 MG 24 hr tablet, TAKE 1 TABLET (25 MG TOTAL) BY MOUTH DAILY., Disp: 90 tablet, Rfl: 1 .  Multiple Vitamins-Minerals (MULTIVITAL PO), Take 1 tablet by mouth daily.  , Disp: , Rfl:   Past Medical History: Past Medical History:  Diagnosis Date  . Allergy   . Anxiety   . Atrial tachycardia ([place]) 12/18/2014   24 hour event monitor with frequent PAC's, PVC's and short runs of atrial tachycardia - no sustained arrhythmias or atrial  fibrillation   . Chronic kidney disease (CKD)   . Constipation   . H/O multiple myeloma    clinical remission  . Hypertension   . Mitral regurgitation   . Mitral valve prolapse   . Osteoporosis   . Pleural effusion, right 11/03/2015  . S/P minimally invasive mitral valve repair 10/15/2015   Complex valvuloplasty including Alfieri edge-to-edge repair, generous quadrangular resection (shortening) of posterior leaflet, sliding leaflet plasty, Gore-tex neochord placement x6, and 38 mm Edwards Physio II ring annuloplasty via right mini thoracotomy approach     Tobacco Use: History  Smoking Status  . Never Smoker  Smokeless Tobacco  . Never Used    Labs: Recent Review Flowsheet Data    Labs for ITP Cardiac and Pulmonary Rehab Latest Ref Rng & Units 10/15/2015 10/15/2015 10/16/2015 10/16/2015 10/16/2015   Cholestrol 0 - 200 mg/dL - - - - -   LDLCALC 0 - 99 mg/dL - - - - -   LDLDIRECT mg/dL - - - - -   HDL >39.00 mg/dL - - - - -   Trlycerides 0.0 - 149.0 mg/dL - - - - -   Hemoglobin A1c 4.8 - 5.6 % - - - - -   PHART 7.350 - 7.450 - 7.291(L) 7.283(L) 7.318(L) -   PCO2ART 35.0 - 45.0 mmHg - 41.2 41.1 42.3 -   HCO3 20.0 - 24.0 mEq/L - 20.0 19.6(L) 21.9 -   TCO2 0 -  100 mmol/L '21 21 21 23 21   '$ ACIDBASEDEF 0.0 - 2.0 mmol/L - 6.0(H) 7.0(H) 4.0(H) -   O2SAT % - 99.0 92.0 96.0 -      Capillary Blood Glucose: Lab Results  Component Value Date   GLUCAP 158 (H) 10/17/2015   GLUCAP 179 (H) 10/17/2015   GLUCAP 117 (H) 10/17/2015   GLUCAP 80 10/17/2015   GLUCAP 96 10/17/2015     Exercise Target Goals:    Exercise Program Goal: Individual exercise prescription set with THRR, safety & activity barriers. Participant demonstrates ability to understand and report RPE using BORG scale, to self-measure pulse accurately, and to acknowledge the importance of the exercise prescription.  Exercise Prescription Goal: Starting with aerobic activity 30 plus minutes a day, 3 days per week for initial exercise  prescription. Provide home exercise prescription and guidelines that participant acknowledges understanding prior to discharge.  Activity Barriers & Risk Stratification:     Activity Barriers & Cardiac Risk Stratification - 12/23/15 1703      Activity Barriers & Cardiac Risk Stratification   Activity Barriers None   Cardiac Risk Stratification Moderate      6 Minute Walk:     6 Minute Walk    Row Name 12/23/15 1700         6 Minute Walk   Phase Initial     Distance 1767 feet     Walk Time 6 minutes     # of Rest Breaks 0     MPH 3.35     METS 4.96     RPE 11     VO2 Peak 17.36     Symptoms No     Resting HR 99 bpm     Resting BP 104/60     Max Ex. HR 110 bpm     Max Ex. BP 104/60     2 Minute Post BP 98/62        Initial Exercise Prescription:     Initial Exercise Prescription - 12/23/15 1700      Date of Initial Exercise RX and Referring Provider   Date 12/23/15   Referring Provider [name] MD     Treadmill   MPH 2.5   Grade 1   Minutes 10   METs 3.26     Bike   Level 1.2   Minutes 10   METs 4.77     NuStep   Level 3   Minutes 10   METs 2.8     Prescription Details   Frequency (times per week) 3   Duration Progress to 30 minutes of continuous aerobic without signs/symptoms of physical distress     Intensity   THRR 40-80% of Max Heartrate 66-132   Ratings of Perceived Exertion 11-13   Perceived Dyspnea 0-4      Perform Capillary Blood Glucose checks as needed.  Exercise Prescription Changes:      Exercise Prescription Changes    Row Name 01/07/16 1000 01/23/16 1000 02/20/16 1600         Exercise Review   Progression Yes Yes Yes       Response to Exercise   Blood Pressure (Admit) 90/62 94/70  asymptomatic 116/81  asymptomatic     Blood Pressure (Exercise) 106/60 100/62 140/80     Blood Pressure (Exit) 95/68 103/69 105/70     Heart Rate (Admit) 88 bpm 99 bpm 99 bpm     Heart Rate (Exercise) 121 bpm 134 bpm 139 bpm  Heart Rate (Exit) 103 bpm 101 bpm 107 bpm     Rating of Perceived Exertion (Exercise) '12 13 11     '$ Duration Progress to 30 minutes of continuous aerobic without signs/symptoms of physical distress Progress to 30 minutes of continuous aerobic without signs/symptoms of physical distress Progress to 30 minutes of continuous aerobic without signs/symptoms of physical distress     Intensity THRR unchanged THRR unchanged THRR unchanged       Progression   Progression Continue to progress workloads to maintain intensity without signs/symptoms of physical distress. Continue to progress workloads to maintain intensity without signs/symptoms of physical distress. Continue to progress workloads to maintain intensity without signs/symptoms of physical distress.     Average METs 3.5 4.8 5.3       Resistance Training   Training Prescription Yes Yes Yes     Weight 1lb 2lbs 2lbs     Reps 10-12 10-12 10-12       Interval Training   Interval Training No No No       Treadmill   MPH 2.5 3.2 3.2     Grade '1 4 4     '$ Minutes '10 10 10     '$ METs 3.26 5.21 5.21       Bike   Level 1.2 1.2 1.2     Minutes '10 10 10     '$ METs 4.77 4.77 4.84       NuStep   Level '3 4 4     '$ Minutes '10 10 10     '$ METs 3.6 4.4 5.3       Home Exercise Plan   Plans to continue exercise at  - Home  reviewed HEP on 7/28 see progress note Home  reviewed HEP on 7/28 see progress note     Frequency  - Add 3 additional days to program exercise sessions. Add 3 additional days to program exercise sessions.        Exercise Comments:      Exercise Comments    Row Name 01/23/16 1043 02/20/16 1641         Exercise Comments Pt is tolerating exercise well; Will continue to monitor exercise progression ( pt was out 01/19/16-811/17 due to being sick Reviewed METs and goals. Pt is tolerating exercise well; will continue to monitor exercise progression.         Discharge Exercise Prescription (Final Exercise Prescription Changes):      Exercise Prescription Changes - 02/20/16 1600      Exercise Review   Progression Yes     Response to Exercise   Blood Pressure (Admit) 116/81  asymptomatic   Blood Pressure (Exercise) 140/80   Blood Pressure (Exit) 105/70   Heart Rate (Admit) 99 bpm   Heart Rate (Exercise) 139 bpm   Heart Rate (Exit) 107 bpm   Rating of Perceived Exertion (Exercise) 11   Duration Progress to 30 minutes of continuous aerobic without signs/symptoms of physical distress   Intensity THRR unchanged     Progression   Progression Continue to progress workloads to maintain intensity without signs/symptoms of physical distress.   Average METs 5.3     Resistance Training   Training Prescription Yes   Weight 2lbs   Reps 10-12     Interval Training   Interval Training No     Treadmill   MPH 3.2   Grade 4   Minutes 10   METs 5.21     Bike   Level 1.2   Minutes 10  METs 4.84     NuStep   Level 4   Minutes 10   METs 5.3     Home Exercise Plan   Plans to continue exercise at Home  reviewed HEP on 7/28 see progress note   Frequency Add 3 additional days to program exercise sessions.      Nutrition:  Target Goals: Understanding of nutrition guidelines, daily intake of sodium '1500mg'$ , cholesterol '200mg'$ , calories 30% from fat and 7% or less from saturated fats, daily to have 5 or more servings of fruits and vegetables.  Biometrics:     Pre Biometrics - 12/23/15 1702      Pre Biometrics   Waist Circumference 25.5 inches   Hip Circumference 36.5 inches   Waist to Hip Ratio 0.7 %   Triceps Skinfold 26 mm   % Body Fat 29.2 %   Grip Strength 27 kg   Flexibility 13 in   Single Leg Stand 30 seconds       Nutrition Therapy Plan and Nutrition Goals:     Nutrition Therapy & Goals - 01/08/16 1417      Nutrition Therapy   Diet Therapeutic Lifestyle Changes   Drug/Food Interactions Coumadin/Vit K     Personal Nutrition Goals   Personal Goal #1 Maintain weight while in Cardiac  Rehab     Intervention Plan   Intervention Prescribe, educate and counsel regarding individualized specific dietary modifications aiming towards targeted core components such as weight, hypertension, lipid management, diabetes, heart failure and other comorbidities.   Expected Outcomes Short Term Goal: Understand basic principles of dietary content, such as calories, fat, sodium, cholesterol and nutrients.;Long Term Goal: Adherence to prescribed nutrition plan.      Nutrition Discharge: Nutrition Scores:     Nutrition Assessments - 01/08/16 1417      MEDFICTS Scores   Pre Score 63  will verify score with pt      Nutrition Goals Re-Evaluation:   Psychosocial: Target Goals: Acknowledge presence or absence of depression, maximize coping skills, provide positive support system. Participant is able to verbalize types and ability to use techniques and skills needed for reducing stress and depression.  Initial Review & Psychosocial Screening:     Initial Psych Review & Screening - 12/25/15 [place]? Yes     Barriers   Psychosocial barriers to participate in program There are no identifiable barriers or psychosocial needs.;The patient should benefit from training in stress management and relaxation.     Screening Interventions   Interventions Encouraged to exercise      Quality of Life Scores:     Quality of Life - 12/23/15 1704      Quality of Life Scores   Health/Function Pre 19.46 %   Socioeconomic Pre 24.5 %   Psych/Spiritual Pre 24.21 %   Family Pre 27.6 %   GLOBAL Pre 22.93 %      PHQ-9: Recent Review Flowsheet Data    Depression screen [facility] 2/9 12/29/2015   Decreased Interest 0   Down, Depressed, Hopeless 1   PHQ - 2 Score 1      Psychosocial Evaluation and Intervention:     Psychosocial Evaluation - 02/26/16 1552      Psychosocial Evaluation & Interventions   Interventions Encouraged to exercise with the  program and follow exercise prescription      Psychosocial Re-Evaluation:     Psychosocial Re-Evaluation    [place] Name 02/26/16 1552 02/26/16 1554  Psychosocial Re-Evaluation   Interventions (P)  Encouraged to attend Cardiac Rehabilitation for the exercise;Stress management education Encouraged to attend Cardiac Rehabilitation for the exercise;Stress management education      Comments  - Encourage and advise pt on stress management techniques. No additional needs identified, no futher intervention required.         Vocational Rehabilitation: Provide vocational rehab assistance to qualifying candidates.   Vocational Rehab Evaluation & Intervention:     Vocational Rehab - 12/25/15 1141      Initial Vocational Rehab Evaluation & Intervention   Assessment shows need for Vocational Rehabilitation No      Education: Education Goals: Education classes will be provided on a weekly basis, covering required topics. Participant will state understanding/return demonstration of topics presented.  Learning Barriers/Preferences:     Learning Barriers/Preferences - 12/25/15 1140      Learning Barriers/Preferences   Learning Barriers Hearing;Sight   Learning Preferences Written Material      Education Topics: Count Your Pulse:  -Group instruction provided by verbal instruction, demonstration, patient participation and written materials to support subject.  Instructors address importance of being able to find your pulse and how to count your pulse when at home without a heart monitor.  Patients get hands on experience counting their pulse with staff help and individually.   Heart Attack, Angina, and Risk Factor Modification:  -Group instruction provided by verbal instruction, video, and written materials to support subject.  Instructors address signs and symptoms of angina and heart attacks.    Also discuss risk factors for heart disease and how to make changes to improve  heart health risk factors.   Functional Fitness:  -Group instruction provided by verbal instruction, demonstration, patient participation, and written materials to support subject.  Instructors address safety measures for doing things around the house.  Discuss how to get up and down off the floor, how to pick things up properly, how to safely get out of a chair without assistance, and balance training.   Meditation and Mindfulness:  -Group instruction provided by verbal instruction, patient participation, and written materials to support subject.  Instructor addresses importance of mindfulness and meditation practice to help reduce stress and improve awareness.  Instructor also leads participants through a meditation exercise.    Stretching for Flexibility and Mobility:  -Group instruction provided by verbal instruction, patient participation, and written materials to support subject.  Instructors lead participants through series of stretches that are designed to increase flexibility thus improving mobility.  These stretches are additional exercise for major muscle groups that are typically performed during regular warm up and cool down.   Hands Only CPR Anytime:  -Group instruction provided by verbal instruction, video, patient participation and written materials to support subject.  Instructors co-teach with AHA video for hands only CPR.  Participants get hands on experience with mannequins.   Nutrition I class: Heart Healthy Eating:  -Group instruction provided by PowerPoint slides, verbal discussion, and written materials to support subject matter. The instructor gives an explanation and review of the Therapeutic Lifestyle Changes diet recommendations, which includes a discussion on lipid goals, dietary fat, sodium, fiber, plant stanol/sterol esters, sugar, and the components of a well-balanced, healthy diet. Flowsheet Row CARDIAC REHAB PHASE II EXERCISE from 02/25/2016 in [place]  Date  02/25/16  Educator  RD  Instruction Review Code  Not applicable [class handouts given]      Nutrition II class: Lifestyle Skills:  -Group instruction provided by  PowerPoint slides, verbal discussion, and written materials to support subject matter. The instructor gives an explanation and review of label reading, grocery shopping for heart health, heart healthy recipe modifications, and ways to make healthier choices when eating out. Flowsheet Row CARDIAC REHAB PHASE II EXERCISE from 02/25/2016 in [place]  Date  02/25/16  Educator  RD  Instruction Review Code  Not applicable [class handouts given]      Diabetes Question & Answer:  -Group instruction provided by PowerPoint slides, verbal discussion, and written materials to support subject matter. The instructor gives an explanation and review of diabetes co-morbidities, pre- and post-prandial blood glucose goals, pre-exercise blood glucose goals, signs, symptoms, and treatment of hypoglycemia and hyperglycemia, and foot care basics.   Diabetes Blitz:  -Group instruction provided by PowerPoint slides, verbal discussion, and written materials to support subject matter. The instructor gives an explanation and review of the physiology behind type 1 and type 2 diabetes, diabetes medications and rational behind using different medications, pre- and post-prandial blood glucose recommendations and Hemoglobin A1c goals, diabetes diet, and exercise including blood glucose guidelines for exercising safely.    Portion Distortion:  -Group instruction provided by PowerPoint slides, verbal discussion, written materials, and food models to support subject matter. The instructor gives an explanation of serving size versus portion size, changes in portions sizes over the last 20 years, and what consists of a serving from each food group.   Stress Management:  -Group instruction  provided by verbal instruction, video, and written materials to support subject matter.  Instructors review role of stress in heart disease and how to cope with stress positively.     Exercising on Your Own:  -Group instruction provided by verbal instruction, power point, and written materials to support subject.  Instructors discuss benefits of exercise, components of exercise, frequency and intensity of exercise, and end points for exercise.  Also discuss use of nitroglycerin and activating EMS.  Review options of places to exercise outside of rehab.  Review guidelines for sex with heart disease.   Cardiac Drugs I:  -Group instruction provided by verbal instruction and written materials to support subject.  Instructor reviews cardiac drug classes: antiplatelets, anticoagulants, beta blockers, and statins.  Instructor discusses reasons, side effects, and lifestyle considerations for each drug class.   Cardiac Drugs II:  -Group instruction provided by verbal instruction and written materials to support subject.  Instructor reviews cardiac drug classes: angiotensin converting enzyme inhibitors (ACE-I), angiotensin II receptor blockers (ARBs), nitrates, and calcium channel blockers.  Instructor discusses reasons, side effects, and lifestyle considerations for each drug class.   Anatomy and Physiology of the Circulatory System:  -Group instruction provided by verbal instruction, video, and written materials to support subject.  Reviews functional anatomy of heart, how it relates to various diagnoses, and what role the heart plays in the overall system.   Knowledge Questionnaire Score:     Knowledge Questionnaire Score - 12/23/15 1703      Knowledge Questionnaire Score   Pre Score 22/24      Core Components/Risk Factors/Patient Goals at Admission:     Personal Goals and Risk Factors at Admission - 12/26/15 0657      Core Components/Risk Factors/Patient Goals on Admission   Hypertension  Yes   Intervention Provide education on lifestyle modifcations including regular physical activity/exercise, weight management, moderate sodium restriction and increased consumption of fresh fruit, vegetables, and low fat dairy, alcohol moderation, and smoking cessation.;Monitor prescription use compliance.  Expected Outcomes Short Term: Continued assessment and intervention until BP is < 140/68m HG in hypertensive participants. < 130/83mHG in hypertensive participants with diabetes, heart failure or chronic kidney disease.;Long Term: Maintenance of blood pressure at goal levels.   Personal Goal Other Yes   Personal Goal Know what she can do physically. Get back to safe exercise routine.   Intervention Provide individualized home exercise program including frequency, duration, intensity and safe parameters.   Expected Outcomes Patient understands safe parameters for exercise including appropriate target heart rate, rate of perceived exertion and warning signs/symptoms.      Core Components/Risk Factors/Patient Goals Review:    Core Components/Risk Factors/Patient Goals at Discharge (Final Review):    ITP Comments:     ITP Comments    Row Name 12/23/15 1342           ITP Comments Medical Director- Dr. TrFransico HimMD          Comments:  Pt is making expected progress toward personal goals after completing 16 sessions. Recommend continued exercise and life style modification education including  stress management and relaxation techniques to decrease cardiac risk profile. Continue to work on stress management techniques, decreased anxiety as it relates to job stress.  Pt recognizes the need to change old habits. Psychosocial Assessment remains unchanged from previous assessment. No needs further identified. No further intervention warranted at this time. CaCherre HugerBSN

## 2016-02-27 ENCOUNTER — Encounter (HOSPITAL_COMMUNITY)
Admission: RE | Admit: 2016-02-27 | Discharge: 2016-02-27 | Disposition: A | Discharge status: Home / Self Care | Payer: BLUE CROSS/BLUE SHIELD | Source: Ambulatory Visit | Attending: Cardiology | Admitting: Cardiology

## 2016-02-27 DIAGNOSIS — Z9889 Other specified postprocedural states: Secondary | ICD-10-CM | POA: Diagnosis not present

## 2016-02-27 DIAGNOSIS — Z954 Presence of other heart-valve replacement: Secondary | ICD-10-CM | POA: Diagnosis not present

## 2016-03-01 ENCOUNTER — Encounter (HOSPITAL_COMMUNITY)
Admission: RE | Admit: 2016-03-01 | Discharge: 2016-03-01 | Disposition: A | Discharge status: Home / Self Care | Payer: BLUE CROSS/BLUE SHIELD | Source: Ambulatory Visit | Attending: Cardiology | Admitting: Cardiology

## 2016-03-01 DIAGNOSIS — Z954 Presence of other heart-valve replacement: Secondary | ICD-10-CM | POA: Diagnosis not present

## 2016-03-01 DIAGNOSIS — Z9889 Other specified postprocedural states: Secondary | ICD-10-CM | POA: Diagnosis not present

## 2016-03-01 NOTE — Telephone Encounter (Signed)
Encounter appears to have been an error

## 2016-03-02 ENCOUNTER — Telehealth: Payer: Self-pay | Admitting: Cardiology

## 2016-03-02 NOTE — Telephone Encounter (Signed)
I will forward to Dr McLean. 

## 2016-03-02 NOTE — Telephone Encounter (Signed)
New message   Pt verbalized that she is calling for the rn to see if she can take a meal supplement (L-theanine)

## 2016-03-02 NOTE — Telephone Encounter (Signed)
Should be ok

## 2016-03-02 NOTE — Telephone Encounter (Signed)
Pt advised should be okay to take L-theanine.

## 2016-03-03 ENCOUNTER — Encounter (HOSPITAL_COMMUNITY)
Admission: RE | Admit: 2016-03-03 | Discharge: 2016-03-03 | Disposition: A | Discharge status: Home / Self Care | Payer: BLUE CROSS/BLUE SHIELD | Source: Ambulatory Visit | Attending: Cardiology | Admitting: Cardiology

## 2016-03-03 DIAGNOSIS — Z9889 Other specified postprocedural states: Secondary | ICD-10-CM | POA: Diagnosis not present

## 2016-03-03 DIAGNOSIS — Z954 Presence of other heart-valve replacement: Secondary | ICD-10-CM | POA: Diagnosis not present

## 2016-03-05 ENCOUNTER — Encounter (HOSPITAL_COMMUNITY)
Admission: RE | Admit: 2016-03-05 | Discharge: 2016-03-05 | Disposition: A | Discharge status: Home / Self Care | Payer: BLUE CROSS/BLUE SHIELD | Source: Ambulatory Visit | Attending: Cardiology | Admitting: Cardiology

## 2016-03-05 DIAGNOSIS — Z9889 Other specified postprocedural states: Secondary | ICD-10-CM | POA: Diagnosis not present

## 2016-03-05 DIAGNOSIS — Z954 Presence of other heart-valve replacement: Secondary | ICD-10-CM | POA: Diagnosis not present

## 2016-03-08 ENCOUNTER — Encounter (HOSPITAL_COMMUNITY): Payer: BLUE CROSS/BLUE SHIELD

## 2016-03-10 ENCOUNTER — Encounter (HOSPITAL_COMMUNITY): Payer: BLUE CROSS/BLUE SHIELD

## 2016-03-12 ENCOUNTER — Encounter (HOSPITAL_COMMUNITY): Payer: BLUE CROSS/BLUE SHIELD

## 2016-03-15 ENCOUNTER — Telehealth (HOSPITAL_COMMUNITY): Payer: Self-pay | Admitting: *Deleted

## 2016-03-15 ENCOUNTER — Encounter (HOSPITAL_COMMUNITY): Payer: BLUE CROSS/BLUE SHIELD

## 2016-03-17 ENCOUNTER — Encounter: Payer: Self-pay | Admitting: Cardiology

## 2016-03-17 ENCOUNTER — Encounter (HOSPITAL_COMMUNITY)
Admission: RE | Admit: 2016-03-17 | Discharge: 2016-03-17 | Disposition: A | Discharge status: Home / Self Care | Payer: BLUE CROSS/BLUE SHIELD | Source: Ambulatory Visit | Attending: Cardiology | Admitting: Cardiology

## 2016-03-17 DIAGNOSIS — Z954 Presence of other heart-valve replacement: Secondary | ICD-10-CM | POA: Diagnosis not present

## 2016-03-17 DIAGNOSIS — Z9889 Other specified postprocedural states: Secondary | ICD-10-CM

## 2016-03-19 ENCOUNTER — Encounter (HOSPITAL_COMMUNITY)
Admission: RE | Admit: 2016-03-19 | Discharge: 2016-03-19 | Disposition: A | Discharge status: Home / Self Care | Payer: BLUE CROSS/BLUE SHIELD | Source: Ambulatory Visit | Attending: Cardiology | Admitting: Cardiology

## 2016-03-19 DIAGNOSIS — Z9889 Other specified postprocedural states: Secondary | ICD-10-CM | POA: Diagnosis not present

## 2016-03-19 DIAGNOSIS — Z954 Presence of other heart-valve replacement: Secondary | ICD-10-CM | POA: Diagnosis not present

## 2016-03-22 ENCOUNTER — Encounter (HOSPITAL_COMMUNITY): Payer: BLUE CROSS/BLUE SHIELD

## 2016-03-24 ENCOUNTER — Encounter (HOSPITAL_COMMUNITY): Payer: BLUE CROSS/BLUE SHIELD

## 2016-03-25 NOTE — Progress Notes (Signed)
Cardiac Individual Treatment Plan  Patient Details  Name: Cristina Garcia MRN: [id number] Date of Birth: Oct 28, 1960 Referring Provider:   Flowsheet Row CARDIAC REHAB PHASE II ORIENTATION from 12/23/2015 in [facility]  Referring Provider  [name] MD      Initial Encounter Date:  [facility] PHASE II ORIENTATION from 12/23/2015 in [facility]  Date  12/23/15  Referring Provider  [name] MD      Visit Diagnosis: S/P mitral valve repair  Patient's Home Medications on Admission:  Current Outpatient Prescriptions:  .  acyclovir (ZOVIRAX) 200 MG capsule, Take 200 mg by mouth daily as needed (BREAKOUTS). Reported on 12/05/2015, Disp: , Rfl:  .  amoxicillin-clavulanate (AUGMENTIN) 875-125 MG tablet, Take 1 tablet by mouth 2 (two) times daily., Disp: 20 tablet, Rfl: 0 .  aspirin 81 MG tablet, Take 81 mg by mouth daily. Reported on 10/08/2015, Disp: , Rfl:  .  calcium carbonate (OS-CAL) 600 MG TABS, Take 600 mg by mouth daily with breakfast. , Disp: , Rfl:  .  Estrogens, Conjugated (PREMARIN VA), Place 1 application vaginally daily as needed (dryness). , Disp: , Rfl:  .  ferrous sulfate 325 (65 FE) MG tablet, Take 325 mg by mouth daily., Disp: , Rfl:  .  lisinopril (PRINIVIL,ZESTRIL) 5 MG tablet, Take 1 tablet (5 mg total) by mouth daily., Disp: 30 tablet, Rfl: 6 .  loratadine (CLARITIN) 10 MG tablet, Take 10 mg by mouth daily., Disp: , Rfl:  .  metoprolol succinate (TOPROL-XL) 25 MG 24 hr tablet, TAKE 1 TABLET (25 MG TOTAL) BY MOUTH DAILY., Disp: 90 tablet, Rfl: 1 .  Multiple Vitamins-Minerals (MULTIVITAL PO), Take 1 tablet by mouth daily.  , Disp: , Rfl:   Past Medical History: Past Medical History:  Diagnosis Date  . Allergy   . Anxiety   . Atrial tachycardia ([place]) 12/18/2014   24 hour event monitor with frequent PAC's, PVC's and short runs of atrial tachycardia - no sustained arrhythmias or atrial  fibrillation   . Chronic kidney disease (CKD)   . Constipation   . H/O multiple myeloma    clinical remission  . Hypertension   . Mitral regurgitation   . Mitral valve prolapse   . Osteoporosis   . Pleural effusion, right 11/03/2015  . S/P minimally invasive mitral valve repair 10/15/2015   Complex valvuloplasty including Alfieri edge-to-edge repair, generous quadrangular resection (shortening) of posterior leaflet, sliding leaflet plasty, Gore-tex neochord placement x6, and 38 mm Edwards Physio II ring annuloplasty via right mini thoracotomy approach     Tobacco Use: History  Smoking Status  . Never Smoker  Smokeless Tobacco  . Never Used    Labs: Recent Review Flowsheet Data    Labs for ITP Cardiac and Pulmonary Rehab Latest Ref Rng & Units 10/15/2015 10/15/2015 10/16/2015 10/16/2015 10/16/2015   Cholestrol 0 - 200 mg/dL - - - - -   LDLCALC 0 - 99 mg/dL - - - - -   LDLDIRECT mg/dL - - - - -   HDL >39.00 mg/dL - - - - -   Trlycerides 0.0 - 149.0 mg/dL - - - - -   Hemoglobin A1c 4.8 - 5.6 % - - - - -   PHART 7.350 - 7.450 - 7.291(L) 7.283(L) 7.318(L) -   PCO2ART 35.0 - 45.0 mmHg - 41.2 41.1 42.3 -   HCO3 20.0 - 24.0 mEq/L - 20.0 19.6(L) 21.9 -   TCO2 0 -  100 mmol/L _0 ACIDBASEDEF 0.0 - 2.0 mmol/L - 6.0(H) 7.0(H) 4.0(H) -   O2SAT % - 99.0 92.0 96.0 -      Capillary Blood Glucose: Lab Results  Component Value Date   GLUCAP 158 (H) 10/17/2015   GLUCAP 179 (H) 10/17/2015   GLUCAP 117 (H) 10/17/2015   GLUCAP 80 10/17/2015   GLUCAP 96 10/17/2015     Exercise Target Goals:    Exercise Program Goal: Individual exercise prescription set with THRR, safety & activity barriers. Participant demonstrates ability to understand and report RPE using BORG scale, to self-measure pulse accurately, and to acknowledge the importance of the exercise prescription.  Exercise Prescription Goal: Starting with aerobic activity 30 plus minutes a day, 3 days per week for initial exercise  prescription. Provide home exercise prescription and guidelines that participant acknowledges understanding prior to discharge.  Activity Barriers & Risk Stratification:     Activity Barriers & Cardiac Risk Stratification - 12/23/15 1703      Activity Barriers & Cardiac Risk Stratification   Activity Barriers None   Cardiac Risk Stratification Moderate      6 Minute Walk:     6 Minute Walk    Row Name 12/23/15 1700         6 Minute Walk   Phase Initial     Distance 1767 feet     Walk Time 6 minutes     # of Rest Breaks 0     MPH 3.35     METS 4.96     RPE 11     VO2 Peak 17.36     Symptoms No     Resting HR 99 bpm     Resting BP 104/60     Max Ex. HR 110 bpm     Max Ex. BP 104/60     2 Minute Post BP 98/62        Initial Exercise Prescription:     Initial Exercise Prescription - 12/23/15 1700      Date of Initial Exercise RX and Referring Provider   Date 12/23/15   Referring Provider [name] MD     Treadmill   MPH 2.5   Grade 1   Minutes 10   METs 3.26     Bike   Level 1.2   Minutes 10   METs 4.77     NuStep   Level 3   Minutes 10   METs 2.8     Prescription Details   Frequency (times per week) 3   Duration Progress to 30 minutes of continuous aerobic without signs/symptoms of physical distress     Intensity   THRR 40-80% of Max Heartrate 66-132   Ratings of Perceived Exertion 11-13   Perceived Dyspnea 0-4      Perform Capillary Blood Glucose checks as needed.  Exercise Prescription Changes:     Exercise Prescription Changes    Row Name 01/07/16 1000 01/23/16 1000 02/20/16 1600 03/17/16 1100       Exercise Review   Progression Yes Yes Yes Yes      Response to Exercise   Blood Pressure (Admit) 90/62 94/70  asymptomatic 116/81  asymptomatic 98/69  asymptomatic    Blood Pressure (Exercise) 106/60 100/62 140/80 120/70    Blood Pressure (Exit) 95/68 103/69 105/70 106/64    Heart Rate (Admit) 88 bpm 99 bpm 99 bpm 100 bpm     Heart Rate (Exercise) 121 bpm 134 bpm 139 bpm 136  bpm    Heart Rate (Exit) 103 bpm 101 bpm 107 bpm 108 bpm    Rating of Perceived Exertion (Exercise) _0 Duration Progress to 30 minutes of continuous aerobic without signs/symptoms of physical distress Progress to 30 minutes of continuous aerobic without signs/symptoms of physical distress Progress to 30 minutes of continuous aerobic without signs/symptoms of physical distress Progress to 30 minutes of continuous aerobic without signs/symptoms of physical distress    Intensity THRR unchanged THRR unchanged THRR unchanged THRR unchanged      Progression   Progression Continue to progress workloads to maintain intensity without signs/symptoms of physical distress. Continue to progress workloads to maintain intensity without signs/symptoms of physical distress. Continue to progress workloads to maintain intensity without signs/symptoms of physical distress. Continue to progress workloads to maintain intensity without signs/symptoms of physical distress.    Average METs 3.5 4.8 5.3 5.5      Resistance Training   Training Prescription Yes Yes Yes Yes    Weight 1lb 2lbs 2lbs 3lbs    Reps 10-12 10-12 10-12 10-12      Interval Training   Interval Training No No No No      Treadmill   MPH 2.5 3.2 3.2 3.3    Grade _1 Minutes _2 METs 3.26 5.21 5.21 5.35      Bike   Level 1.2 1.2 1.2 1.2    Minutes _3 METs 4.77 4.77 4.84 4.85      NuStep   Level _4 Minutes _5 METs 3.6 4.4 5.3 5.6      Home Exercise Plan   Plans to continue exercise at  - Home  reviewed HEP on 7/28 see progress note Home  reviewed HEP on 7/28 see progress note Home  reviewed HEP on 7/28 see progress note    Frequency  - Add 3 additional days to program exercise sessions. Add 3 additional days to program exercise sessions. Add 3 additional days to program exercise sessions.       Exercise Comments:      Exercise Comments    Row Name 01/23/16 1043 02/20/16 1641 03/17/16 1109       Exercise Comments Pt is tolerating exercise well; Will continue to monitor exercise progression ( pt was out 01/19/16-811/17 due to being sick Reviewed METs and goals. Pt is tolerating exercise well; will continue to monitor exercise progression. Reviewed METs and goals. Pt is tolerating exercise well; will continue to monitor exercise progression.        Discharge Exercise Prescription (Final Exercise Prescription Changes):     Exercise Prescription Changes - 03/17/16 1100      Exercise Review   Progression Yes     Response to Exercise   Blood Pressure (Admit) 98/69  asymptomatic   Blood Pressure (Exercise) 120/70   Blood Pressure (Exit) 106/64   Heart Rate (Admit) 100 bpm   Heart Rate (Exercise) 136 bpm   Heart Rate (Exit) 108 bpm   Rating of Perceived Exertion (Exercise) 11   Duration Progress to 30 minutes of continuous aerobic without signs/symptoms of physical distress   Intensity THRR unchanged     Progression   Progression Continue to progress workloads to maintain intensity without signs/symptoms of physical distress.   Average METs 5.5     Resistance Training  Training Prescription Yes   Weight 3lbs   Reps 10-12     Interval Training   Interval Training No     Treadmill   MPH 3.3   Grade 4   Minutes 10   METs 5.35     Bike   Level 1.2   Minutes 10   METs 4.85     NuStep   Level 5   Minutes 10   METs 5.6     Home Exercise Plan   Plans to continue exercise at Home  reviewed HEP on 7/28 see progress note   Frequency Add 3 additional days to program exercise sessions.      Nutrition:  Target Goals: Understanding of nutrition guidelines, daily intake of sodium <1514m, cholesterol <2027m calories 30% from fat and 7% or less from saturated fats, daily to have 5 or more servings of fruits and vegetables.  Biometrics:     Pre Biometrics - 12/23/15 1702      Pre  Biometrics   Waist Circumference 25.5 inches   Hip Circumference 36.5 inches   Waist to Hip Ratio 0.7 %   Triceps Skinfold 26 mm   % Body Fat 29.2 %   Grip Strength 27 kg   Flexibility 13 in   Single Leg Stand 30 seconds       Nutrition Therapy Plan and Nutrition Goals:     Nutrition Therapy & Goals - 01/08/16 1417      Nutrition Therapy   Diet Therapeutic Lifestyle Changes   Drug/Food Interactions Coumadin/Vit K     Personal Nutrition Goals   Personal Goal #1 Maintain weight while in Cardiac Rehab     Intervention Plan   Intervention Prescribe, educate and counsel regarding individualized specific dietary modifications aiming towards targeted core components such as weight, hypertension, lipid management, diabetes, heart failure and other comorbidities.   Expected Outcomes Short Term Goal: Understand basic principles of dietary content, such as calories, fat, sodium, cholesterol and nutrients.;Long Term Goal: Adherence to prescribed nutrition plan.      Nutrition Discharge: Nutrition Scores:     Nutrition Assessments - 01/08/16 1417      MEDFICTS Scores   Pre Score 63  will verify score with pt      Nutrition Goals Re-Evaluation:   Psychosocial: Target Goals: Acknowledge presence or absence of depression, maximize coping skills, provide positive support system. Participant is able to verbalize types and ability to use techniques and skills needed for reducing stress and depression.  Initial Review & Psychosocial Screening:     Initial Psych Review & Screening - 12/25/15 [id number]     Barriers   Psychosocial barriers to participate in program There are no identifiable barriers or psychosocial needs.;The patient should benefit from training in stress management and relaxation.     Screening Interventions   Interventions Encouraged to exercise      Quality of Life Scores:     Quality of Life - 12/23/15 1704       Quality of Life Scores   Health/Function Pre 19.46 %   Socioeconomic Pre 24.5 %   Psych/Spiritual Pre 24.21 %   Family Pre 27.6 %   GLOBAL Pre 22.93 %      PHQ-9: Recent Review Flowsheet Data    [facility]   Decreased Interest 0   Down, Depressed, Hopeless 1   PHQ - 2 Score 1  Psychosocial Evaluation and Intervention:     Psychosocial Evaluation - 03/25/16 1513      Psychosocial Evaluation & Interventions   Interventions Encouraged to exercise with the program and follow exercise prescription;Stress management education   Comments pt continues to struggle with stress management particular with her work.  Pt is striving to create the balance between work and caring for herself.      Psychosocial Re-Evaluation:     Psychosocial Re-Evaluation    [place] Name 02/26/16 1552 02/26/16 1554 03/05/16 0816         Psychosocial Re-Evaluation   Interventions (P)  Encouraged to attend Cardiac Rehabilitation for the exercise;Stress management education Encouraged to attend Cardiac Rehabilitation for the exercise;Stress management education Stress management education;Relaxation education;Encouraged to attend Cardiac Rehabilitation for the exercise     Comments  - Encourage and advise pt on stress management techniques. No additional needs identified, no futher intervention required. pt reports she has started L-theanine. for relaxation.  she has found this to allow her to sleep better.  pt has also found different coping techniques to deal with conflicts with coworker.  pt has relaxation trip scheduled this weekend and has learned to use dancing at home as a stress reliever.         Vocational Rehabilitation: Provide vocational rehab assistance to qualifying candidates.   Vocational Rehab Evaluation & Intervention:     Vocational Rehab - 12/25/15 1141      Initial Vocational Rehab Evaluation & Intervention   Assessment shows need for Vocational  Rehabilitation No      Education: Education Goals: Education classes will be provided on a weekly basis, covering required topics. Participant will state understanding/return demonstration of topics presented.  Learning Barriers/Preferences:     Learning Barriers/Preferences - 12/25/15 1140      Learning Barriers/Preferences   Learning Barriers Hearing;Sight   Learning Preferences Written Material      Education Topics: Count Your Pulse:  -Group instruction provided by verbal instruction, demonstration, patient participation and written materials to support subject.  Instructors address importance of being able to find your pulse and how to count your pulse when at home without a heart monitor.  Patients get hands on experience counting their pulse with staff help and individually.   Heart Attack, Angina, and Risk Factor Modification:  -Group instruction provided by verbal instruction, video, and written materials to support subject.  Instructors address signs and symptoms of angina and heart attacks.    Also discuss risk factors for heart disease and how to make changes to improve heart health risk factors.   Functional Fitness:  -Group instruction provided by verbal instruction, demonstration, patient participation, and written materials to support subject.  Instructors address safety measures for doing things around the house.  Discuss how to get up and down off the floor, how to pick things up properly, how to safely get out of a chair without assistance, and balance training.   Meditation and Mindfulness:  -Group instruction provided by verbal instruction, patient participation, and written materials to support subject.  Instructor addresses importance of mindfulness and meditation practice to help reduce stress and improve awareness.  Instructor also leads participants through a meditation exercise.    Stretching for Flexibility and Mobility:  -Group instruction provided by  verbal instruction, patient participation, and written materials to support subject.  Instructors lead participants through series of stretches that are designed to increase flexibility thus improving mobility.  These stretches are additional exercise for major muscle groups that  are typically performed during regular warm up and cool down.   Hands Only CPR Anytime:  -Group instruction provided by verbal instruction, video, patient participation and written materials to support subject.  Instructors co-teach with AHA video for hands only CPR.  Participants get hands on experience with mannequins.   Nutrition I class: Heart Healthy Eating:  -Group instruction provided by PowerPoint slides, verbal discussion, and written materials to support subject matter. The instructor gives an explanation and review of the Therapeutic Lifestyle Changes diet recommendations, which includes a discussion on lipid goals, dietary fat, sodium, fiber, plant stanol/sterol esters, sugar, and the components of a well-balanced, healthy diet. Flowsheet Row CARDIAC REHAB PHASE II EXERCISE from 02/25/2016 in [facility]  Date  02/25/16  Educator  RD  Instruction Review Code  Not applicable [class handouts given]      Nutrition II class: Lifestyle Skills:  -Group instruction provided by PowerPoint slides, verbal discussion, and written materials to support subject matter. The instructor gives an explanation and review of label reading, grocery shopping for heart health, heart healthy recipe modifications, and ways to make healthier choices when eating out. Flowsheet Row CARDIAC REHAB PHASE II EXERCISE from 02/25/2016 in [facility]  Date  02/25/16  Educator  RD  Instruction Review Code  Not applicable [class handouts given]      Diabetes Question & Answer:  -Group instruction provided by PowerPoint slides, verbal discussion, and written materials to support  subject matter. The instructor gives an explanation and review of diabetes co-morbidities, pre- and post-prandial blood glucose goals, pre-exercise blood glucose goals, signs, symptoms, and treatment of hypoglycemia and hyperglycemia, and foot care basics.   Diabetes Blitz:  -Group instruction provided by PowerPoint slides, verbal discussion, and written materials to support subject matter. The instructor gives an explanation and review of the physiology behind type 1 and type 2 diabetes, diabetes medications and rational behind using different medications, pre- and post-prandial blood glucose recommendations and Hemoglobin A1c goals, diabetes diet, and exercise including blood glucose guidelines for exercising safely.    Portion Distortion:  -Group instruction provided by PowerPoint slides, verbal discussion, written materials, and food models to support subject matter. The instructor gives an explanation of serving size versus portion size, changes in portions sizes over the last 20 years, and what consists of a serving from each food group.   Stress Management:  -Group instruction provided by verbal instruction, video, and written materials to support subject matter.  Instructors review role of stress in heart disease and how to cope with stress positively.     Exercising on Your Own:  -Group instruction provided by verbal instruction, power point, and written materials to support subject.  Instructors discuss benefits of exercise, components of exercise, frequency and intensity of exercise, and end points for exercise.  Also discuss use of nitroglycerin and activating EMS.  Review options of places to exercise outside of rehab.  Review guidelines for sex with heart disease.   Cardiac Drugs I:  -Group instruction provided by verbal instruction and written materials to support subject.  Instructor reviews cardiac drug classes: antiplatelets, anticoagulants, beta blockers, and statins.   Instructor discusses reasons, side effects, and lifestyle considerations for each drug class.   Cardiac Drugs II:  -Group instruction provided by verbal instruction and written materials to support subject.  Instructor reviews cardiac drug classes: angiotensin converting enzyme inhibitors (ACE-I), angiotensin II receptor blockers (ARBs), nitrates, and calcium channel blockers.  Instructor discusses  reasons, side effects, and lifestyle considerations for each drug class.   Anatomy and Physiology of the Circulatory System:  -Group instruction provided by verbal instruction, video, and written materials to support subject.  Reviews functional anatomy of heart, how it relates to various diagnoses, and what role the heart plays in the overall system.   Knowledge Questionnaire Score:     Knowledge Questionnaire Score - 12/23/15 1703      Knowledge Questionnaire Score   Pre Score 22/24      Core Components/Risk Factors/Patient Goals at Admission:     Personal Goals and Risk Factors at Admission - 12/26/15 0657      Core Components/Risk Factors/Patient Goals on Admission   Hypertension Yes   Intervention Provide education on lifestyle modifcations including regular physical activity/exercise, weight management, moderate sodium restriction and increased consumption of fresh fruit, vegetables, and low fat dairy, alcohol moderation, and smoking cessation.;Monitor prescription use compliance.   Expected Outcomes Short Term: Continued assessment and intervention until BP is < 140/90m HG in hypertensive participants. < 130/85mHG in hypertensive participants with diabetes, heart failure or chronic kidney disease.;Long Term: Maintenance of blood pressure at goal levels.   Personal Goal Other Yes   Personal Goal Know what she can do physically. Get back to safe exercise routine.   Intervention Provide individualized home exercise program including frequency, duration, intensity and safe parameters.    Expected Outcomes Patient understands safe parameters for exercise including appropriate target heart rate, rate of perceived exertion and warning signs/symptoms.      Core Components/Risk Factors/Patient Goals Review:      Goals and Risk Factor Review    Row Name 03/17/16 0748             Core Components/Risk Factors/Patient Goals Review   Personal Goals Review Improve shortness of breath with ADL's;Other       Review Pt is not as nervous with walking on her own and confidence has improved with activity. Pt is able to sing at church without SOB       Expected Outcomes Pt will continue to exercise in confidence and perform activities without SOB          Core Components/Risk Factors/Patient Goals at Discharge (Final Review):      Goals and Risk Factor Review - 03/17/16 0748      Core Components/Risk Factors/Patient Goals Review   Personal Goals Review Improve shortness of breath with ADL's;Other   Review Pt is not as nervous with walking on her own and confidence has improved with activity. Pt is able to sing at church without SOB   Expected Outcomes Pt will continue to exercise in confidence and perform activities without SOB      ITP Comments:     ITP Comments    Row Name 12/23/15 1342           ITP Comments Medical Director- Dr. TrFransico HimMD          Comments:  Pt is making expected progress toward personal goals after completing 22 sessions.  Continue to stress the importance of stress management. Pt often has conflict with striving for the balance between work and caring for self.  Offered pt to attend 2 x week to help ease any stress pt may have about participating in rehab three times week. Recommend continued exercise and life style modification education including  stress management and relaxation techniques to decrease cardiac risk profile.  CaCherre HugerBSN

## 2016-03-26 ENCOUNTER — Encounter (HOSPITAL_COMMUNITY): Payer: BLUE CROSS/BLUE SHIELD

## 2016-03-27 ENCOUNTER — Other Ambulatory Visit: Payer: Self-pay | Admitting: Cardiology

## 2016-03-29 ENCOUNTER — Encounter (HOSPITAL_COMMUNITY)
Admission: RE | Admit: 2016-03-29 | Discharge: 2016-03-29 | Disposition: A | Discharge status: Home / Self Care | Payer: BLUE CROSS/BLUE SHIELD | Source: Ambulatory Visit | Attending: Cardiology | Admitting: Cardiology

## 2016-03-29 DIAGNOSIS — Z954 Presence of other heart-valve replacement: Secondary | ICD-10-CM | POA: Diagnosis not present

## 2016-03-29 DIAGNOSIS — Z9889 Other specified postprocedural states: Secondary | ICD-10-CM | POA: Diagnosis not present

## 2016-03-31 ENCOUNTER — Encounter: Payer: Self-pay | Admitting: Cardiology

## 2016-03-31 ENCOUNTER — Encounter (HOSPITAL_COMMUNITY)
Admission: RE | Admit: 2016-03-31 | Discharge: 2016-03-31 | Disposition: A | Discharge status: Home / Self Care | Payer: BLUE CROSS/BLUE SHIELD | Source: Ambulatory Visit | Attending: Cardiology | Admitting: Cardiology

## 2016-03-31 ENCOUNTER — Ambulatory Visit (INDEPENDENT_AMBULATORY_CARE_PROVIDER_SITE_OTHER): Payer: BLUE CROSS/BLUE SHIELD | Admitting: Cardiology

## 2016-03-31 VITALS — BP 112/60 | HR 56 | Ht 69.0 in | Wt 130.8 lb

## 2016-03-31 DIAGNOSIS — I340 Nonrheumatic mitral (valve) insufficiency: Secondary | ICD-10-CM

## 2016-03-31 DIAGNOSIS — Z9889 Other specified postprocedural states: Secondary | ICD-10-CM | POA: Diagnosis not present

## 2016-03-31 DIAGNOSIS — Z954 Presence of other heart-valve replacement: Secondary | ICD-10-CM | POA: Diagnosis not present

## 2016-03-31 NOTE — Patient Instructions (Signed)
Medication Instructions:  Your physician recommends that you continue on your current medications as directed. Please refer to the Current Medication list given to you today.   Labwork: None   Testing/Procedures: Your physician has requested that you have an echocardiogram. Echocardiography is a painless test that uses sound waves to create images of your heart. It provides your doctor with information about the size and shape of your heart and how well your heart's chambers and valves are working. This procedure takes approximately one hour. There are no restrictions for this procedure.  In 6 MONTHS before you see Dr Meda Coffee  Follow-Up: Your physician wants you to follow-up in: 6 months with Dr Meda Coffee. (April 2018).  You will receive a reminder letter in the mail two months in advance. If you don't receive a letter, please call our office to schedule the follow-up appointment.      If you need a refill on your cardiac medications before your next appointment, please call your pharmacy.

## 2016-04-02 ENCOUNTER — Encounter (HOSPITAL_COMMUNITY)
Admission: RE | Admit: 2016-04-02 | Discharge: 2016-04-02 | Disposition: A | Discharge status: Home / Self Care | Payer: BLUE CROSS/BLUE SHIELD | Source: Ambulatory Visit | Attending: Cardiology | Admitting: Cardiology

## 2016-04-02 DIAGNOSIS — Z9889 Other specified postprocedural states: Secondary | ICD-10-CM

## 2016-04-02 DIAGNOSIS — Z954 Presence of other heart-valve replacement: Secondary | ICD-10-CM | POA: Diagnosis not present

## 2016-04-02 NOTE — Progress Notes (Signed)
Patient ID: Cristina Garcia, female   DOB: 04/19/1961, 55 y.o.   MRN: [id number] PCP: Dr. Sherren Mocha  55 yo with mitral valve prolapse and mitral regurgitation presents for cardiology evaluation.  Patient has had a long history of murmur and has been told for years that she has mitral valve prolapse.  No history of this in her family though her mother had rheumatic fever and a mitral valve operation.  Her PCP sent her for an echo in 1/15, which showed bileaflet prolapse and probably moderate MR.  TEE was recommended.  This was done in 3/15 and showed moderate to severe MR from bileaflet mitral valve prolapse with normal LV size and no pulmonary vein systolic doppler flow reversal.  She had repeat echo in 6/16.  It continued to show moderate to severe mid to late systolic mitral regurgitation in the setting of MVP.  EF 60%, mild LV dilation, moderate LAE.  Echo in 2/17 showed severe MR due to MVP.    Last winter, she began to develop exertional dyspnea.  I was concerned that this came from her mitral valve.  TEE confirmed severe MR from bileaflet prolapse. LHC showed no significant CAD.  In 5/17, she had a complex minimally invasive mitral valve repair by Dr Roxy Manns.  She had Alfieri edge-to-edge repair and annuloplasty.  Post-operatively, she required a right thoracentesis.  Echo in 6/17 (post-op) showed EF 45%, mild LV dilation, stable repaired mitral valve with no MR or MS.  PASP 48 mmHg.   She is generally doing well.  Back to work now.  Doing cardiac rehab.  BP runs on the low side but not getting lightheaded.  No palpitations.  No exertional dyspnea.  No chest pain.    Labs (2/15): creatinine 1.18 Labs (10/16): K 4, creatinine 1.3 Labs (5/17): K 3.8, creatinine 1.13, plts 101, hgb 8 Labs (7/17): K 4.2, creatinine 1.16  PMH: 1. Mitral valve prolapse: Echo (1/15) with EF 55-60%, grade II diastolic dysfunction, bileaflet MV prolapse, moderate MR.  TEE (3/15) with EF 60%, normal RV size and systolic  function, bileaflet MV prolapse, moderate to severe MR (moderate by PISA), no pulmonary vein systolic flow reversal.  Echo (6/16) with EF 60%, MVP with moderate-severe mid to late systolic MR, mild LV dilation (53 mm EDD), grade II diastolic dysfunction, moderate LAE. Echo (2/17) with EF 55-60%, mild LV dilation (56 mm EDD), grade II diastolic dysfunction, moderate LAE, bileaflet MVP with severe MR, normal RV size and systolic function, unable to estimate PA pressure.  - Echo (6/17) post-op with EF 45%, mild LV dilation, s/p MV repair with no significant regurgitation or stenosis, PASP 48 mmHg.  - S/p mitral valve repair in 5/17.   2. Multiple myeloma: s/p chemotherapy and autologous stem cell transplant at [facility] in 6/08, clinical remission.  3. CKD 2/2 multiple myeloma 4. H/o rectal bleeding with negative colonoscopy in 3/13.  5. Osteopenia 6. Atrial tachycardia: 30 day monitor in 8/16 with atrial tachycardia, no atrial fibrillation.  7. ETT (8/16) with 8'56" exercise, no ischemia, frequent PACs/PVCs.  LHC (3/17) showed no CAD.    SH: Married, nonsmoker, works for [employer].  2 kids.   FH: HTN, lymphoma, diabetes. Mother with rheumatic fever and had MV replacement.   ROS: All systems reviewed and negative except as per HPI.   Current Outpatient Prescriptions  Medication Sig Dispense Refill  . acyclovir (ZOVIRAX) 200 MG capsule Take 200 mg by mouth daily as needed (BREAKOUTS). Reported on 12/05/2015    .  aspirin 81 MG tablet Take 81 mg by mouth daily. Reported on 10/08/2015    . calcium carbonate (OS-CAL) 600 MG TABS Take 600 mg by mouth daily with breakfast.     . Estrogens, Conjugated (PREMARIN VA) Place 1 application vaginally daily as needed (dryness).     . ferrous sulfate 325 (65 FE) MG tablet Take 325 mg by mouth daily.    Marland Kitchen lisinopril (PRINIVIL,ZESTRIL) 5 MG tablet Take 5 mg by mouth at bedtime.    Marland Kitchen loratadine (CLARITIN) 10 MG tablet Take 10 mg by mouth daily.    .  metoprolol succinate (TOPROL-XL) 25 MG 24 hr tablet Take 25 mg by mouth daily before breakfast.    . Multiple Vitamins-Minerals (MULTIVITAL PO) Take 1 tablet by mouth daily.       No current facility-administered medications for this visit.     BP 112/60   Pulse (!) 56   Ht '5\' 9"'  (1.753 m)   Wt 130 lb 12.8 oz (59.3 kg)   BMI 19.32 kg/m  General: NAD Neck: No JVD, no thyromegaly or thyroid nodule.  Lungs: Mildly decreased breath sounds right base.  CV: Nondisplaced PMI.  Heart regular S1/S2, no S3/S4, no murmur.  No peripheral edema.  No carotid bruit.  Normal pedal pulses.  Abdomen: Soft, nontender, no hepatosplenomegaly, no distention.  Skin: Intact without lesions or rashes.  Neurologic: Alert and oriented x 3.  Psych: Normal affect. Extremities: No clubbing or cyanosis.   Assessment/Plan: 1. Mitral regurgitation: Now s/p mitral valve repair.  Post-op echo shows no significant regurgitation or stenosis, but EF was down to 45%.  I suspect EF before appeared artificially high due to the severe MR.   - Continue ASA 81 daily post-MV repair.  She will need antibiotic prophylaxis with dental work.  - Continue cardiac rehab.  2. Cardiomyopathy: Mildly decreased EF, likely due to mitral regurgitation.   - Continue Toprol XL and lisinopril.  No BP room to increase. - Repeat echo in 1 year to look for improvement.   Followup in 6 months.   She will see Dr. Meda Coffee given my transition to CHF clinic.   [name] 04/02/2016

## 2016-04-05 ENCOUNTER — Ambulatory Visit (HOSPITAL_COMMUNITY)
Admission: RE | Admit: 2016-04-05 | Discharge: 2016-04-05 | Disposition: A | Discharge status: Home / Self Care | Payer: BLUE CROSS/BLUE SHIELD | Source: Ambulatory Visit | Attending: Oncology | Admitting: Oncology

## 2016-04-05 ENCOUNTER — Encounter (HOSPITAL_COMMUNITY)
Admission: RE | Admit: 2016-04-05 | Discharge: 2016-04-05 | Disposition: A | Discharge status: Home / Self Care | Payer: BLUE CROSS/BLUE SHIELD | Source: Ambulatory Visit | Attending: Cardiology | Admitting: Cardiology

## 2016-04-05 VITALS — Ht 70.0 in | Wt 130.5 lb

## 2016-04-05 DIAGNOSIS — C9001 Multiple myeloma in remission: Secondary | ICD-10-CM

## 2016-04-05 DIAGNOSIS — Z9889 Other specified postprocedural states: Secondary | ICD-10-CM

## 2016-04-05 DIAGNOSIS — Z954 Presence of other heart-valve replacement: Secondary | ICD-10-CM | POA: Diagnosis not present

## 2016-04-07 ENCOUNTER — Encounter (HOSPITAL_COMMUNITY)
Admission: RE | Admit: 2016-04-07 | Discharge: 2016-04-07 | Disposition: A | Discharge status: Home / Self Care | Payer: BLUE CROSS/BLUE SHIELD | Source: Ambulatory Visit | Attending: Cardiology | Admitting: Cardiology

## 2016-04-07 DIAGNOSIS — Z9889 Other specified postprocedural states: Secondary | ICD-10-CM

## 2016-04-07 DIAGNOSIS — Z954 Presence of other heart-valve replacement: Secondary | ICD-10-CM | POA: Diagnosis not present

## 2016-04-09 ENCOUNTER — Encounter (HOSPITAL_COMMUNITY)
Admission: RE | Admit: 2016-04-09 | Discharge: 2016-04-09 | Disposition: A | Discharge status: Home / Self Care | Payer: BLUE CROSS/BLUE SHIELD | Source: Ambulatory Visit | Attending: Cardiology | Admitting: Cardiology

## 2016-04-09 DIAGNOSIS — Z9889 Other specified postprocedural states: Secondary | ICD-10-CM | POA: Diagnosis not present

## 2016-04-09 DIAGNOSIS — Z954 Presence of other heart-valve replacement: Secondary | ICD-10-CM | POA: Diagnosis not present

## 2016-04-12 ENCOUNTER — Encounter (HOSPITAL_COMMUNITY): Payer: Self-pay

## 2016-04-12 ENCOUNTER — Encounter (HOSPITAL_COMMUNITY)
Admission: RE | Admit: 2016-04-12 | Discharge: 2016-04-12 | Disposition: A | Discharge status: Home / Self Care | Payer: BLUE CROSS/BLUE SHIELD | Source: Ambulatory Visit | Attending: Cardiology | Admitting: Cardiology

## 2016-04-12 DIAGNOSIS — Z954 Presence of other heart-valve replacement: Secondary | ICD-10-CM | POA: Diagnosis not present

## 2016-04-12 DIAGNOSIS — Z9889 Other specified postprocedural states: Secondary | ICD-10-CM | POA: Diagnosis not present

## 2016-04-14 ENCOUNTER — Encounter (HOSPITAL_COMMUNITY)
Admission: RE | Admit: 2016-04-14 | Discharge: 2016-04-14 | Disposition: A | Discharge status: Home / Self Care | Payer: BLUE CROSS/BLUE SHIELD | Source: Ambulatory Visit | Attending: Cardiology | Admitting: Cardiology

## 2016-04-14 VITALS — Wt 130.1 lb

## 2016-04-14 DIAGNOSIS — Z954 Presence of other heart-valve replacement: Secondary | ICD-10-CM | POA: Diagnosis not present

## 2016-04-14 DIAGNOSIS — Z9889 Other specified postprocedural states: Secondary | ICD-10-CM | POA: Insufficient documentation

## 2016-04-14 NOTE — Progress Notes (Signed)
Discharge Summary  Patient Details  Name: Cristina Garcia MRN: [id number] Date of Birth: 19-Jul-1960 Referring Provider:   Flowsheet Row CARDIAC REHAB PHASE II ORIENTATION from 12/23/2015 in [place]  Referring Provider  [name] MD       Number of Visits: 30  Reason for Discharge:  Patient reached a stable level of exercise. Patient independent in their exercise.  Smoking History:  History  Smoking Status  . Never Smoker  Smokeless Tobacco  . Never Used    Diagnosis:  S/P mitral valve repair  ADL UCSD:   Initial Exercise Prescription:     Initial Exercise Prescription - 12/23/15 1700      Date of Initial Exercise RX and Referring Provider   Date 12/23/15   Referring Provider [name] MD     Treadmill   MPH 2.5   Grade 1   Minutes 10   METs 3.26     Bike   Level 1.2   Minutes 10   METs 4.77     NuStep   Level 3   Minutes 10   METs 2.8     Prescription Details   Frequency (times per week) 3   Duration Progress to 30 minutes of continuous aerobic without signs/symptoms of physical distress     Intensity   THRR 40-80% of Max Heartrate 66-132   Ratings of Perceived Exertion 11-13   Perceived Dyspnea 0-4      Discharge Exercise Prescription (Final Exercise Prescription Changes):     Exercise Prescription Changes - 04/16/16 1500      Exercise Review   Progression Yes     Response to Exercise   Blood Pressure (Admit) 92/60  asymptomatic   Blood Pressure (Exercise) 130/60   Blood Pressure (Exit) 104/73   Heart Rate (Admit) 92 bpm   Heart Rate (Exercise) 130 bpm   Heart Rate (Exit) 92 bpm   Rating of Perceived Exertion (Exercise) 10   Duration Progress to 30 minutes of continuous aerobic without signs/symptoms of physical distress   Intensity THRR unchanged     Progression   Progression Continue to progress workloads to maintain intensity without signs/symptoms of physical distress.   Average  METs 5.8     Resistance Training   Training Prescription Yes   Weight 3lbs   Reps 10-12     Interval Training   Interval Training No     Treadmill   MPH 3.5   Grade 5   Minutes 10   METs 6.09     Bike   Level 1.5   Minutes 10   METs 5.85     NuStep   Level 5   Minutes 10   METs 5.6     Home Exercise Plan   Plans to continue exercise at Home  reviewed HEP on 7/28 see progress note   Frequency Add 3 additional days to program exercise sessions.      Functional Capacity:     6 Minute Walk    Row Name 12/23/15 1700 04/05/16 1614       6 Minute Walk   Phase Initial Discharge    Distance 1767 feet 2460 feet    Distance % Change  - 39.2 %    Walk Time 6 minutes 6 minutes    # of Rest Breaks 0 0    MPH 3.35 4.66    METS 4.96 6.58    RPE 11 9    Perceived Dyspnea   -  0    VO2 Peak 17.36 23.03    Symptoms No No    Resting HR 99 bpm 93 bpm    Resting BP 104/60 109/74    Max Ex. HR 110 bpm 133 bpm    Max Ex. BP 104/60 128/82    2 Minute Post BP 98/62 102/74       Psychological, QOL, Others - Outcomes: PHQ 2/9: Depression screen [facility] 2/9 04/14/2016 12/29/2015  Decreased Interest 0 0  Down, Depressed, Hopeless 0 1  PHQ - 2 Score 0 1    Quality of Life:     Quality of Life - 04/16/16 1552      Quality of Life Scores   Health/Function Pre 19.46 %   Health/Function Post 22.93 %   Health/Function % Change 17.83 %   Socioeconomic Pre 24.5 %   Socioeconomic Post 26.07 %   Socioeconomic % Change  6.41 %   Psych/Spiritual Pre 24.21 %   Psych/Spiritual Post 25.07 %   Psych/Spiritual % Change 3.55 %   Family Pre 27.6 %   Family Post 27.6 %   Family % Change 0 %   GLOBAL Pre 22.93 %   GLOBAL Post 24.71 %   GLOBAL % Change 7.76 %      Personal Goals: Goals established at orientation with interventions provided to work toward goal.     Personal Goals and Risk Factors at Admission - 12/26/15 0657      Core Components/Risk Factors/Patient Goals on  Admission   Hypertension Yes   Intervention Provide education on lifestyle modifcations including regular physical activity/exercise, weight management, moderate sodium restriction and increased consumption of fresh fruit, vegetables, and low fat dairy, alcohol moderation, and smoking cessation.;Monitor prescription use compliance.   Expected Outcomes Short Term: Continued assessment and intervention until BP is < 140/62m HG in hypertensive participants. < 130/818mHG in hypertensive participants with diabetes, heart failure or chronic kidney disease.;Long Term: Maintenance of blood pressure at goal levels.   Personal Goal Other Yes   Personal Goal Know what she can do physically. Get back to safe exercise routine.   Intervention Provide individualized home exercise program including frequency, duration, intensity and safe parameters.   Expected Outcomes Patient understands safe parameters for exercise including appropriate target heart rate, rate of perceived exertion and warning signs/symptoms.       Personal Goals Discharge:     Goals and Risk Factor Review    Row Name 03/17/16 0748 04/09/16 1538 04/09/16 1548         Core Components/Risk Factors/Patient Goals Review   Personal Goals Review Improve shortness of breath with ADL's;Other Other  -     Review Pt is not as nervous with walking on her own and confidence has improved with activity. Pt is able to sing at church without SOB Pt feels more confident about exercising on her own and knows limitation. Pt feels that she benefited from CRPII Pt feels more confident about exercising on her own and knows limitation. Pt feels that she benefited from CRPII and getting more of a work-life balance     Expected Outcomes Pt will continue to exercise in confidence and perform activities without SOB Pt will continue to exercise in confidence and without difficulty Pt will continue to exercise in confidence and without difficulty, Also develop more a  work-life balance        Nutrition & Weight - Outcomes:     Pre Biometrics - 12/23/15 1702  Pre Biometrics   Waist Circumference 25.5 inches   Hip Circumference 36.5 inches   Waist to Hip Ratio 0.7 %   Triceps Skinfold 26 mm   % Body Fat 29.2 %   Grip Strength 27 kg   Flexibility 13 in   Single Leg Stand 30 seconds         Post Biometrics - 04/16/16 1552       Post  Biometrics   Weight 130 lb 1.1 oz (59 kg)      Nutrition:     Nutrition Therapy & Goals - 01/08/16 1417      Nutrition Therapy   Diet Therapeutic Lifestyle Changes   Drug/Food Interactions Coumadin/Vit K     Personal Nutrition Goals   Personal Goal #1 Maintain weight while in Cardiac Rehab     Intervention Plan   Intervention Prescribe, educate and counsel regarding individualized specific dietary modifications aiming towards targeted core components such as weight, hypertension, lipid management, diabetes, heart failure and other comorbidities.   Expected Outcomes Short Term Goal: Understand basic principles of dietary content, such as calories, fat, sodium, cholesterol and nutrients.;Long Term Goal: Adherence to prescribed nutrition plan.      Nutrition Discharge:     Nutrition Assessments - 04/23/16 1354      MEDFICTS Scores   Pre Score 63   Post Score 66   Score Difference 3      Education Questionnaire Score:     Knowledge Questionnaire Score - 04/20/16 1015      Knowledge Questionnaire Score   Post Score 23/24      Goals reviewed with patient. Pt graduated from cardiac rehab program today with completion of 30 exercise sessions in Phase II. Pt maintained good attendance and progressed nicely during his participation in rehab as evidenced by increased MET level.   Medication list reconciled. Repeat  PHQ score-0  .  Pt has made significant lifestyle changes and should be commended for her success.Continues to work on stress managmagementand plans to change positions this month  which will greatly reduce her work stress.  Pt feels he has achieved her goals during cardiac rehab.   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

## 2016-04-15 ENCOUNTER — Other Ambulatory Visit (HOSPITAL_BASED_OUTPATIENT_CLINIC_OR_DEPARTMENT_OTHER): Payer: BLUE CROSS/BLUE SHIELD

## 2016-04-15 ENCOUNTER — Ambulatory Visit (HOSPITAL_BASED_OUTPATIENT_CLINIC_OR_DEPARTMENT_OTHER): Payer: BLUE CROSS/BLUE SHIELD | Admitting: Oncology

## 2016-04-15 ENCOUNTER — Telehealth: Payer: Self-pay | Admitting: Oncology

## 2016-04-15 VITALS — BP 105/72 | HR 92 | Temp 98.2°F | Resp 17 | Ht 70.0 in | Wt 132.3 lb

## 2016-04-15 DIAGNOSIS — Z23 Encounter for immunization: Secondary | ICD-10-CM

## 2016-04-15 DIAGNOSIS — C9001 Multiple myeloma in remission: Secondary | ICD-10-CM | POA: Diagnosis not present

## 2016-04-15 LAB — BASIC METABOLIC PANEL
Anion Gap: 8 mEq/L (ref 3–11)
BUN: 26.5 mg/dL — ABNORMAL HIGH (ref 7.0–26.0)
CALCIUM: 9.5 mg/dL (ref 8.4–10.4)
CHLORIDE: 103 meq/L (ref 98–109)
CO2: 25 meq/L (ref 22–29)
Creatinine: 1.2 mg/dL — ABNORMAL HIGH (ref 0.6–1.1)
EGFR: 50 mL/min/{1.73_m2} — AB (ref >90)
Glucose: 90 mg/dl (ref 70–140)
Potassium: 4.3 mEq/L (ref 3.5–5.1)
SODIUM: 136 meq/L (ref 136–145)

## 2016-04-15 LAB — CBC WITH DIFFERENTIAL/PLATELET
BASO%: 0.6 % (ref 0.0–2.0)
Basophils Absolute: 0 10*3/uL (ref 0.0–0.1)
EOS%: 1 % (ref 0.0–7.0)
Eosinophils Absolute: 0 10*3/uL (ref 0.0–0.5)
HCT: 45.3 % (ref 34.8–46.6)
HEMOGLOBIN: 15 g/dL (ref 11.6–15.9)
LYMPH%: 27.2 % (ref 14.0–49.7)
MCH: 31 pg (ref 25.1–34.0)
MCHC: 33 g/dL (ref 31.5–36.0)
MCV: 94 fL (ref 79.5–101.0)
MONO#: 0.5 10*3/uL (ref 0.1–0.9)
MONO%: 9.7 % (ref 0.0–14.0)
NEUT%: 61.5 % (ref 38.4–76.8)
NEUTROS ABS: 3 10*3/uL (ref 1.5–6.5)
Platelets: 181 10*3/uL (ref 145–400)
RBC: 4.83 10*6/uL (ref 3.70–5.45)
RDW: 14 % (ref 11.2–14.5)
WBC: 4.8 10*3/uL (ref 3.9–10.3)
lymph#: 1.3 10*3/uL (ref 0.9–3.3)

## 2016-04-15 MED ORDER — INFLUENZA VAC SPLIT QUAD 0.5 ML IM SUSY
0.5000 mL | PREFILLED_SYRINGE | Freq: Once | INTRAMUSCULAR | Status: AC
  Administered 2016-04-15: 0.5 mL via INTRAMUSCULAR
  Filled 2016-04-15: qty 0.5

## 2016-04-15 NOTE — Progress Notes (Signed)
  [facility] OFFICE PROGRESS NOTE   Diagnosis: Multiple myeloma  INTERVAL HISTORY:   Cristina Garcia returns as scheduled. She underwent minimally invasive repair of the mitral valve by Dr. Roxy Manns on 10/15/2015. She has recovered from surgery. She finished cardiac rehabilitation this week. She has noted improvement in dyspnea following surgery.  No significant pain. No recent severe infection. She had a mild upper respiratory infection last month. The symptoms resolved after a course of antibiotics. A metastatic bone survey on 04/05/2016 revealed stable lucencies in the left iliac and left sacral ala.  Objective:  Vital signs in last 24 hours:  Blood pressure 105/72, pulse 92, temperature 98.2 F (36.8 C), temperature source Oral, resp. rate (!) 92, height '5\' 10"'$  (1.778 m), weight 132 lb 4.8 oz (60 kg), SpO2 99 %.   Resp: Lungs clear bilaterally Cardio: Regular rate and rhythm GI: No hepatosplenomegaly Vascular: No leg edema  Lab Results:  Lab Results  Component Value Date   WBC 4.8 04/15/2016   HGB 15.0 04/15/2016   HCT 45.3 04/15/2016   MCV 94.0 04/15/2016   PLT 181 04/15/2016   NEUTROABS 3.0 04/15/2016   Potassium 4.3, BUN 26.5, creatinine 1.2  Medications: I have reviewed the patient's current medications.  Assessment/Plan: 1. Multiple myeloma, IgA kappa, status post high-dose chemotherapy with autologous stem cell support at [facility] in June 2008. She remains in clinical remission. 2.  history of Renal insufficiency secondary to multiple myeloma.  3. History of hypokalemia. 4. History of left pelvic pain secondary to a lytic left iliac lesion. A metastatic bone survey was unchanged 07/30/2014 5. Simple cyst at the lower pole of the right kidney on a renal ultrasound, 11/08/2006. 6. Multiseptated cyst of the left adnexa on an ultrasound, 11/08/2006. 7. Osteopenia 8. Mitral valve prolapse/mitral regurgitation-minimally invasive mitral valve repair  10/15/2015 9. Report of intermittent rectal bleeding. Normal colonoscopy by Dr. Fuller Plan on 08/20/2011.   Disposition:  Cristina Garcia appears well. No clinical evidence for progression of the myeloma. We will follow-up on the light chains and serum protein electrophoresis from today. She will return for an office and lab visit in 6 months per she received an influenza vaccine today.  [name], MD  04/15/2016  3:52 PM

## 2016-04-15 NOTE — Telephone Encounter (Signed)
Appointments scheduled per 11/2 LOS. The patient was given AVS report and calendars with future scheduled appointments. °

## 2016-04-16 ENCOUNTER — Encounter (HOSPITAL_COMMUNITY): Payer: BLUE CROSS/BLUE SHIELD

## 2016-04-16 LAB — KAPPA/LAMBDA LIGHT CHAINS
IG LAMBDA FREE LIGHT CHAIN: 10.2 mg/L (ref 5.7–26.3)
Ig Kappa Free Light Chain: 16.5 mg/L (ref 3.3–19.4)
KAPPA/LAMBDA FLC RATIO: 1.62 (ref 0.26–1.65)

## 2016-04-19 ENCOUNTER — Encounter (HOSPITAL_COMMUNITY): Payer: BLUE CROSS/BLUE SHIELD

## 2016-04-19 LAB — PROTEIN ELECTROPHORESIS, SERUM
A/G Ratio: 1.5 (ref 0.7–1.7)
ALBUMIN: 3.9 g/dL (ref 2.9–4.4)
ALPHA 2: 0.6 g/dL (ref 0.4–1.0)
Alpha 1: 0.2 g/dL (ref 0.0–0.4)
BETA: 0.9 g/dL (ref 0.7–1.3)
GAMMA GLOBULIN: 0.8 g/dL (ref 0.4–1.8)
GLOBULIN, TOTAL: 2.6 g/dL (ref 2.2–3.9)

## 2016-04-19 LAB — IMMUNOFIXATION ELECTROPHORESIS
IGG (IMMUNOGLOBIN G), SERUM: 786 mg/dL (ref 700–1600)
IGM (IMMUNOGLOBIN M), SRM: 74 mg/dL (ref 26–217)
IgA, Qn, Serum: 110 mg/dL (ref 87–352)
Total Protein: 6.5 g/dL (ref 6.0–8.5)

## 2016-04-21 ENCOUNTER — Encounter (HOSPITAL_COMMUNITY): Payer: BLUE CROSS/BLUE SHIELD

## 2016-04-23 ENCOUNTER — Encounter (HOSPITAL_COMMUNITY): Payer: Self-pay | Admitting: *Deleted

## 2016-04-23 ENCOUNTER — Encounter (HOSPITAL_COMMUNITY): Payer: BLUE CROSS/BLUE SHIELD

## 2016-04-26 ENCOUNTER — Encounter (HOSPITAL_COMMUNITY): Payer: BLUE CROSS/BLUE SHIELD

## 2016-04-28 ENCOUNTER — Encounter (HOSPITAL_COMMUNITY): Payer: BLUE CROSS/BLUE SHIELD

## 2016-04-30 ENCOUNTER — Encounter (HOSPITAL_COMMUNITY): Payer: BLUE CROSS/BLUE SHIELD

## 2016-05-03 ENCOUNTER — Telehealth: Payer: Self-pay | Admitting: General Practice

## 2016-05-03 NOTE — Telephone Encounter (Signed)
Spoke with pt confirmed May 2018 appts.

## 2016-05-12 ENCOUNTER — Other Ambulatory Visit: Payer: Self-pay | Admitting: Family Medicine

## 2016-06-08 ENCOUNTER — Telehealth: Payer: Self-pay | Admitting: Family Medicine

## 2016-06-08 NOTE — Telephone Encounter (Signed)
Patient Name: Cristina Garcia  DOB: 13-Apr-1961    Initial Comment Caller is congested, blowing out thick green mucous   Nurse Assessment  Nurse: Raphael Gibney, RN, Vera Date/Time (Eastern Time): 06/08/2016 10:16:41 AM  Confirm and document reason for call. If symptomatic, describe symptoms. ---Caller states she has sinus congestion. has thick green mucus from her nose. Has had blood tinged mucus at times. No fever. Has a cough. Has had sinus congestion a week ago on Sunday.  Does the patient have any new or worsening symptoms? ---Yes  Will a triage be completed? ---Yes  Related visit to physician within the last 2 weeks? ---No  Does the PT have any chronic conditions? (i.e. diabetes, asthma, etc.) ---Yes  List chronic conditions. ---heart valve repair in May  Is this a behavioral health or substance abuse call? ---No     Guidelines    Guideline Title Affirmed Question Affirmed Notes  Sinus Pain or Congestion Lots of coughing    Final Disposition User   See PCP When Office is Open (within 3 days) Raphael Gibney, RN, Vanita Ingles    Comments  appt scheduled for 06/09/2016 at 11:30 am with Dr. Shanon Ace   Referrals  REFERRED TO PCP OFFICE   Disagree/Comply: Comply

## 2016-06-08 NOTE — Progress Notes (Signed)
Pre visit review using our clinic review tool, if applicable. No additional management support is needed unless otherwise documented below in the visit note.  Chief Complaint  Patient presents with  . Nasal Congestion    Started the weekend of the 16th.  . Sinus Pressure/Pain  . Post Nasal Drip  . Cough  . Hoarse  . Generalized Body Aches    HPI: Cristina Garcia 55 y.o.  sda  PCP NA sent in by team health  Onset symptoms about 10 days ago upper respiratory congestion and nasal discharge that come fairly prominent with nasal discharge that Cristina Garcia is never seen which is very thick and pops out and the plugs of dark green mucus. No hemoptysis chest pain shortness of breath. Some facial pressure especially over the mid forehead. Denies any fever has taken a decongestant and NyQuil. Some minor help. 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 No history of asthma. Remote history of stem cell transplant for MM in remission and minimally invasive surgery for mitral valve. No other recent changes ROS: See pertinent positives and negatives per HPI. No chest pain shortness of breath fever chills  Past Medical History:  Diagnosis Date  . Allergy   . Anxiety   . Atrial tachycardia ([place]) 12/18/2014   24 hour event monitor with frequent PAC's, PVC's and short runs of atrial tachycardia - no sustained arrhythmias or atrial fibrillation   . Chronic kidney disease (CKD)   . Constipation   . H/O multiple myeloma    clinical remission  . Hypertension   . Mitral regurgitation   . Mitral valve prolapse   . Osteoporosis   . Pleural effusion, right 11/03/2015  . S/P minimally invasive mitral valve repair 10/15/2015   Complex valvuloplasty including Alfieri edge-to-edge repair, generous quadrangular resection (shortening) of posterior leaflet, sliding leaflet plasty, Gore-tex neochord placement x6, and 38 mm Edwards Physio II ring annuloplasty via right mini thoracotomy approach     Family History  Problem  Relation Age of Onset  . Non-Hodgkin's lymphoma Mother   . Cancer Mother   . Congestive Heart Failure Mother   . Rheumatic fever Mother     with mitral valve replacment  . Hypertension Father   . Diabetes      family hx  . Hypertension      family hx  . Lymphoma      family hx  . Colon cancer Neg Hx   . Stomach cancer Neg Hx     Social History   Social History  . Marital status: Married    Spouse name: N/A  . Number of children: N/A  . Years of education: N/A   Social History Main Topics  . Smoking status: Never Smoker  . Smokeless tobacco: Never Used  . Alcohol use 2.4 oz/week    4 Glasses of wine per week  . Drug use: No  . Sexual activity: Not Asked   Other Topics Concern  . None   Social History Narrative  . None    Outpatient Medications Prior to Visit  Medication Sig Dispense Refill  . acyclovir (ZOVIRAX) 200 MG capsule Take 200 mg by mouth daily as needed (BREAKOUTS). Reported on 12/05/2015    . aspirin 81 MG tablet Take 81 mg by mouth daily. Reported on 10/08/2015    . calcium carbonate (OS-CAL) 600 MG TABS Take 600 mg by mouth daily with breakfast.     . Estrogens, Conjugated (PREMARIN VA) Place 1 application vaginally daily as needed (dryness).     Marland Kitchen  ferrous sulfate 325 (65 FE) MG tablet Take 325 mg by mouth daily.    Marland Kitchen lisinopril (PRINIVIL,ZESTRIL) 5 MG tablet TAKE 1 TABLET BY MOUTH EVERY MORNING 90 tablet 1  . loratadine (CLARITIN) 10 MG tablet Take 10 mg by mouth daily.    . metoprolol succinate (TOPROL-XL) 25 MG 24 hr tablet Take 25 mg by mouth daily before breakfast.    . Multiple Vitamins-Minerals (MULTIVITAL PO) Take 1 tablet by mouth daily.      Marland Kitchen lisinopril (PRINIVIL,ZESTRIL) 5 MG tablet Take 5 mg by mouth at bedtime.     No facility-administered medications prior to visit.      EXAM:  BP (!) 86/60 (BP Location: Left Arm, Patient Position: Sitting, Cuff Size: Normal)   Temp 97.6 F (36.4 C) (Oral)   Wt 132 lb 9.6 oz (60.1 kg)   BMI 19.03  kg/m   Body mass index is 19.03 kg/m.  GENERAL: vitals reviewed and listed above, alert, oriented, appears well hydrated and in no acute distressShe is quite congested nontoxic normal respirations and voice strength. HEENT: atraumatic, conjunctiva  clear, no obvious abnormalities on inspection of external nose and ears TMs are intact nares congested mildly tender over ethmoid frontal area OP : no lesion edema or exudate  NECK: no obvious masses on inspection palpation  LUNGS: clear to auscultation bilaterally, no wheezes, rales or rhonchi,  CV: HRRR, no clubbing cyanosis or  peripheral edema nl cap refill  MS: moves all extremities without noticeable focal  Abnormality Skin normal capillary refill no acute rashes. PSYCH: pleasant and cooperative, no obvious depression or anxiety  ASSESSMENT AND PLAN:  Discussed the following assessment and plan:  Acute sinusitis with symptoms greater than 10 days  Acute upper respiratory infection Upper respiratory infection with probable sinusitis. Expectant management more aggressive sinus hygiene saline short-term Afrin Flonase or nasal cortisone. Can add antibiotic if not improving in the next 2-3 days or if getting worse. Alarms findings contact us. Risk benefit of medication discussed.  -Patient advised to return or notify health care team  if symptoms worsen ,persist or new concerns arise.  Patient Instructions  Most sinusitis resolves on its and but times antibiotics are helpful. Use nasal saline to help blow out the mucus can use Afrin in the short run for the pressure and congestion. You can also add daily nasal cortisone Flonase or Nasacort 2 sprays each side of the nose once a day. If the pressure and nasty discharge is not improving in the next 48-72 hours or if getting worse add the antibiotic as we discussed. Either way  should be feeling better by the end of this weekend.    Sinusitis, Adult Sinusitis is soreness and inflammation  of your sinuses. Sinuses are hollow spaces in the bones around your face. Your sinuses are located:  Around your eyes.  In the middle of your forehead.  Behind your nose.  In your cheekbones. Your sinuses and nasal passages are lined with a stringy fluid (mucus). Mucus normally drains out of your sinuses. When your nasal tissues become inflamed or swollen, the mucus can become trapped or blocked so air cannot flow through your sinuses. This allows bacteria, viruses, and funguses to grow, which leads to infection. Sinusitis can develop quickly and last for 7?10 days (acute) or for more than 12 weeks (chronic). Sinusitis often develops after a cold. What are the causes? This condition is caused by anything that creates swelling in the sinuses or stops mucus from draining, including:  Allergies.  Asthma.  Bacterial or viral infection.  Abnormally shaped bones between the nasal passages.  Nasal growths that contain mucus (nasal polyps).  Narrow sinus openings.  Pollutants, such as chemicals or irritants in the air.  A foreign object stuck in the nose.  A fungal infection. This is rare. What increases the risk? The following factors may make you more likely to develop this condition:  Having allergies or asthma.  Having had a recent cold or respiratory tract infection.  Having structural deformities or blockages in your nose or sinuses.  Having a weak immune system.  Doing a lot of swimming or diving.  Overusing nasal sprays.  Smoking. What are the signs or symptoms? The main symptoms of this condition are pain and a feeling of pressure around the affected sinuses. Other symptoms include:  Upper toothache.  Earache.  Headache.  Bad breath.  Decreased sense of smell and taste.  A cough that may get worse at night.  Fatigue.  Fever.  Thick drainage from your nose. The drainage is often green and it may contain pus (purulent).  Stuffy nose or  congestion.  Postnasal drip. This is when extra mucus collects in the throat or back of the nose.  Swelling and warmth over the affected sinuses.  Sore throat.  Sensitivity to light. How is this diagnosed? This condition is diagnosed based on symptoms, a medical history, and a physical exam. To find out if your condition is acute or chronic, your health care provider may:  Look in your nose for signs of nasal polyps.  Tap over the affected sinus to check for signs of infection.  View the inside of your sinuses using an imaging device that has a light attached (endoscope). If your health care provider suspects that you have chronic sinusitis, you may also:  Be tested for allergies.  Have a sample of mucus taken from your nose (nasal culture) and checked for bacteria.  Have a mucus sample examined to see if your sinusitis is related to an allergy. If your sinusitis does not respond to treatment and it lasts longer than 8 weeks, you may have an MRI or CT scan to check your sinuses. These scans also help to determine how severe your infection is. In rare cases, a bone biopsy may be done to rule out more serious types of fungal sinus disease. How is this treated? Treatment for sinusitis depends on the cause and whether your condition is chronic or acute. If a virus is causing your sinusitis, your symptoms will go away on their own within 10 days. You may be given medicines to relieve your symptoms, including:  Topical nasal decongestants. They shrink swollen nasal passages and let mucus drain from your sinuses.  Antihistamines. These drugs block inflammation that is triggered by allergies. This can help to ease swelling in your nose and sinuses.  Topical nasal corticosteroids. These are nasal sprays that ease inflammation and swelling in your nose and sinuses.  Nasal saline washes. These rinses can help to get rid of thick mucus in your nose. If your condition is caused by bacteria,  you will be given an antibiotic medicine. If your condition is caused by a fungus, you will be given an antifungal medicine. Surgery may be needed to correct underlying conditions, such as narrow nasal passages. Surgery may also be needed to remove polyps. Follow these instructions at home: Medicines  Take, use, or apply over-the-counter and prescription medicines only as told by your health care provider.  These may include nasal sprays.  If you were prescribed an antibiotic medicine, take it as told by your health care provider. Do not stop taking the antibiotic even if you start to feel better. Hydrate and Humidify  Drink enough water to keep your urine clear or pale yellow. Staying hydrated will help to thin your mucus.  Use a cool mist humidifier to keep the humidity level in your home above 50%.  Inhale steam for 10-15 minutes, 3-4 times a day or as told by your health care provider. You can do this in the bathroom while a hot shower is running.  Limit your exposure to cool or dry air. Rest  Rest as much as possible.  Sleep with your head raised (elevated).  Make sure to get enough sleep each night. General instructions  Apply a warm, moist washcloth to your face 3-4 times a day or as told by your health care provider. This will help with discomfort.  Wash your hands often with soap and water to reduce your exposure to viruses and other germs. If soap and water are not available, use hand sanitizer.  Do not smoke. Avoid being around people who are smoking (secondhand smoke).  Keep all follow-up visits as told by your health care provider. This is important. Contact a health care provider if:  You have a fever.  Your symptoms get worse.  Your symptoms do not improve within 10 days. Get help right away if:  You have a severe headache.  You have persistent vomiting.  You have pain or swelling around your face or eyes.  You have vision problems.  You develop  confusion.  Your neck is stiff.  You have trouble breathing. This information is not intended to replace advice given to you by your health care provider. Make sure you discuss any questions you have with your health care provider. Document Released: 05/31/2005 Document Revised: 01/25/2016 Document Reviewed: 03/26/2015 Elsevier Interactive Patient Education  2017 [place] K. Radonna Bracher M.D.

## 2016-06-09 ENCOUNTER — Encounter: Payer: Self-pay | Admitting: Internal Medicine

## 2016-06-09 ENCOUNTER — Ambulatory Visit (INDEPENDENT_AMBULATORY_CARE_PROVIDER_SITE_OTHER): Payer: BLUE CROSS/BLUE SHIELD | Admitting: Internal Medicine

## 2016-06-09 VITALS — BP 86/60 | Temp 97.6°F | Wt 132.6 lb

## 2016-06-09 DIAGNOSIS — J069 Acute upper respiratory infection, unspecified: Secondary | ICD-10-CM

## 2016-06-09 DIAGNOSIS — J019 Acute sinusitis, unspecified: Secondary | ICD-10-CM

## 2016-06-09 MED ORDER — AMOXICILLIN-POT CLAVULANATE 875-125 MG PO TABS: 1.0000 | ORAL_TABLET | Freq: Two times a day (BID) | ORAL | 0 refills | Status: DC

## 2016-06-09 NOTE — Patient Instructions (Signed)
Most sinusitis resolves on its and but times antibiotics are helpful. Use nasal saline to help blow out the mucus can use Afrin in the short run for the pressure and congestion. You can also add daily nasal cortisone Flonase or Nasacort 2 sprays each side of the nose once a day. If the pressure and nasty discharge is not improving in the next 48-72 hours or if getting worse add the antibiotic as we discussed. Either way  should be feeling better by the end of this weekend.    Sinusitis, Adult Sinusitis is soreness and inflammation of your sinuses. Sinuses are hollow spaces in the bones around your face. Your sinuses are located:  Around your eyes.  In the middle of your forehead.  Behind your nose.  In your cheekbones. Your sinuses and nasal passages are lined with a stringy fluid (mucus). Mucus normally drains out of your sinuses. When your nasal tissues become inflamed or swollen, the mucus can become trapped or blocked so air cannot flow through your sinuses. This allows bacteria, viruses, and funguses to grow, which leads to infection. Sinusitis can develop quickly and last for 7?10 days (acute) or for more than 12 weeks (chronic). Sinusitis often develops after a cold. What are the causes? This condition is caused by anything that creates swelling in the sinuses or stops mucus from draining, including:  Allergies.  Asthma.  Bacterial or viral infection.  Abnormally shaped bones between the nasal passages.  Nasal growths that contain mucus (nasal polyps).  Narrow sinus openings.  Pollutants, such as chemicals or irritants in the air.  A foreign object stuck in the nose.  A fungal infection. This is rare. What increases the risk? The following factors may make you more likely to develop this condition:  Having allergies or asthma.  Having had a recent cold or respiratory tract infection.  Having structural deformities or blockages in your nose or sinuses.  Having a  weak immune system.  Doing a lot of swimming or diving.  Overusing nasal sprays.  Smoking. What are the signs or symptoms? The main symptoms of this condition are pain and a feeling of pressure around the affected sinuses. Other symptoms include:  Upper toothache.  Earache.  Headache.  Bad breath.  Decreased sense of smell and taste.  A cough that may get worse at night.  Fatigue.  Fever.  Thick drainage from your nose. The drainage is often green and it may contain pus (purulent).  Stuffy nose or congestion.  Postnasal drip. This is when extra mucus collects in the throat or back of the nose.  Swelling and warmth over the affected sinuses.  Sore throat.  Sensitivity to light. How is this diagnosed? This condition is diagnosed based on symptoms, a medical history, and a physical exam. To find out if your condition is acute or chronic, your health care provider may:  Look in your nose for signs of nasal polyps.  Tap over the affected sinus to check for signs of infection.  View the inside of your sinuses using an imaging device that has a light attached (endoscope). If your health care provider suspects that you have chronic sinusitis, you may also:  Be tested for allergies.  Have a sample of mucus taken from your nose (nasal culture) and checked for bacteria.  Have a mucus sample examined to see if your sinusitis is related to an allergy. If your sinusitis does not respond to treatment and it lasts longer than 8 weeks, you may  have an MRI or CT scan to check your sinuses. These scans also help to determine how severe your infection is. In rare cases, a bone biopsy may be done to rule out more serious types of fungal sinus disease. How is this treated? Treatment for sinusitis depends on the cause and whether your condition is chronic or acute. If a virus is causing your sinusitis, your symptoms will go away on their own within 10 days. You may be given medicines  to relieve your symptoms, including:  Topical nasal decongestants. They shrink swollen nasal passages and let mucus drain from your sinuses.  Antihistamines. These drugs block inflammation that is triggered by allergies. This can help to ease swelling in your nose and sinuses.  Topical nasal corticosteroids. These are nasal sprays that ease inflammation and swelling in your nose and sinuses.  Nasal saline washes. These rinses can help to get rid of thick mucus in your nose. If your condition is caused by bacteria, you will be given an antibiotic medicine. If your condition is caused by a fungus, you will be given an antifungal medicine. Surgery may be needed to correct underlying conditions, such as narrow nasal passages. Surgery may also be needed to remove polyps. Follow these instructions at home: Medicines  Take, use, or apply over-the-counter and prescription medicines only as told by your health care provider. These may include nasal sprays.  If you were prescribed an antibiotic medicine, take it as told by your health care provider. Do not stop taking the antibiotic even if you start to feel better. Hydrate and Humidify  Drink enough water to keep your urine clear or pale yellow. Staying hydrated will help to thin your mucus.  Use a cool mist humidifier to keep the humidity level in your home above 50%.  Inhale steam for 10-15 minutes, 3-4 times a day or as told by your health care provider. You can do this in the bathroom while a hot shower is running.  Limit your exposure to cool or dry air. Rest  Rest as much as possible.  Sleep with your head raised (elevated).  Make sure to get enough sleep each night. General instructions  Apply a warm, moist washcloth to your face 3-4 times a day or as told by your health care provider. This will help with discomfort.  Wash your hands often with soap and water to reduce your exposure to viruses and other germs. If soap and water are  not available, use hand sanitizer.  Do not smoke. Avoid being around people who are smoking (secondhand smoke).  Keep all follow-up visits as told by your health care provider. This is important. Contact a health care provider if:  You have a fever.  Your symptoms get worse.  Your symptoms do not improve within 10 days. Get help right away if:  You have a severe headache.  You have persistent vomiting.  You have pain or swelling around your face or eyes.  You have vision problems.  You develop confusion.  Your neck is stiff.  You have trouble breathing. This information is not intended to replace advice given to you by your health care provider. Make sure you discuss any questions you have with your health care provider. Document Released: 05/31/2005 Document Revised: 01/25/2016 Document Reviewed: 03/26/2015 Elsevier Interactive Patient Education  2017 [employer].

## 2016-07-28 DIAGNOSIS — M7742 Metatarsalgia, left foot: Secondary | ICD-10-CM | POA: Diagnosis not present

## 2016-07-28 DIAGNOSIS — M2042 Other hammer toe(s) (acquired), left foot: Secondary | ICD-10-CM | POA: Diagnosis not present

## 2016-08-11 ENCOUNTER — Telehealth: Payer: Self-pay | Admitting: Cardiology

## 2016-08-11 NOTE — Telephone Encounter (Signed)
Pt advised surgical clearance has been received and is waiting for Dr Aundra Dubin to review--I expect Dr Aundra Dubin to review 08/16/16.

## 2016-08-11 NOTE — Telephone Encounter (Signed)
New message   Pt verbalized that she is calling to speak to rn to see if she received medical clearance

## 2016-08-16 NOTE — Telephone Encounter (Signed)
Dr Aundra Dubin was going to complete Monday night and leave completed form on his cart in office.

## 2016-08-17 NOTE — Telephone Encounter (Signed)
Spoke with the pt and informed her that Dr Aundra Dubin advised on her surgical clearance form last night.  Informed the pt that this will be faxed to [facility] to review and provide further follow-up with the pt.  Pt verbalized understanding and agrees with this plan.  Pt very gracious for all the assistance.

## 2016-08-20 ENCOUNTER — Other Ambulatory Visit: Payer: Self-pay | Admitting: Orthopedic Surgery

## 2016-09-06 ENCOUNTER — Encounter (HOSPITAL_BASED_OUTPATIENT_CLINIC_OR_DEPARTMENT_OTHER): Payer: Self-pay | Admitting: *Deleted

## 2016-09-06 NOTE — Progress Notes (Signed)
LM for Velvet at [facility] re: clearance for surgery. Awaiting return call

## 2016-09-09 ENCOUNTER — Ambulatory Visit (HOSPITAL_BASED_OUTPATIENT_CLINIC_OR_DEPARTMENT_OTHER)
Admission: RE | Admit: 2016-09-09 | Discharge: 2016-09-09 | Disposition: A | Discharge status: Home / Self Care | Payer: BLUE CROSS/BLUE SHIELD | Source: Ambulatory Visit | Attending: Orthopedic Surgery | Admitting: Orthopedic Surgery

## 2016-09-09 ENCOUNTER — Encounter (HOSPITAL_BASED_OUTPATIENT_CLINIC_OR_DEPARTMENT_OTHER)
Admission: RE | Disposition: A | Discharge status: Home / Self Care | Payer: Self-pay | Source: Ambulatory Visit | Attending: Orthopedic Surgery

## 2016-09-09 ENCOUNTER — Encounter (HOSPITAL_BASED_OUTPATIENT_CLINIC_OR_DEPARTMENT_OTHER): Payer: Self-pay | Admitting: *Deleted

## 2016-09-09 ENCOUNTER — Ambulatory Visit (HOSPITAL_BASED_OUTPATIENT_CLINIC_OR_DEPARTMENT_OTHER): Payer: BLUE CROSS/BLUE SHIELD | Admitting: Anesthesiology

## 2016-09-09 DIAGNOSIS — M2042 Other hammer toe(s) (acquired), left foot: Secondary | ICD-10-CM | POA: Diagnosis not present

## 2016-09-09 DIAGNOSIS — Z79890 Hormone replacement therapy (postmenopausal): Secondary | ICD-10-CM | POA: Insufficient documentation

## 2016-09-09 DIAGNOSIS — I10 Essential (primary) hypertension: Secondary | ICD-10-CM | POA: Diagnosis not present

## 2016-09-09 DIAGNOSIS — Z79899 Other long term (current) drug therapy: Secondary | ICD-10-CM | POA: Insufficient documentation

## 2016-09-09 DIAGNOSIS — N189 Chronic kidney disease, unspecified: Secondary | ICD-10-CM | POA: Diagnosis not present

## 2016-09-09 DIAGNOSIS — M7742 Metatarsalgia, left foot: Secondary | ICD-10-CM | POA: Diagnosis not present

## 2016-09-09 DIAGNOSIS — Z9889 Other specified postprocedural states: Secondary | ICD-10-CM

## 2016-09-09 DIAGNOSIS — Z8579 Personal history of other malignant neoplasms of lymphoid, hematopoietic and related tissues: Secondary | ICD-10-CM | POA: Diagnosis not present

## 2016-09-09 DIAGNOSIS — Z9484 Stem cells transplant status: Secondary | ICD-10-CM | POA: Insufficient documentation

## 2016-09-09 DIAGNOSIS — Z7982 Long term (current) use of aspirin: Secondary | ICD-10-CM | POA: Insufficient documentation

## 2016-09-09 DIAGNOSIS — I129 Hypertensive chronic kidney disease with stage 1 through stage 4 chronic kidney disease, or unspecified chronic kidney disease: Secondary | ICD-10-CM | POA: Diagnosis not present

## 2016-09-09 DIAGNOSIS — M216X2 Other acquired deformities of left foot: Secondary | ICD-10-CM | POA: Diagnosis not present

## 2016-09-09 DIAGNOSIS — G8918 Other acute postprocedural pain: Secondary | ICD-10-CM | POA: Diagnosis not present

## 2016-09-09 HISTORY — PX: HAMMERTOE RECONSTRUCTION WITH WEIL OSTEOTOMY: SHX5631

## 2016-09-09 SURGERY — HAMMERTOE RECONSTRUCTION WITH WEIL OSTEOTOMY
Anesthesia: Regional | Site: Foot | Laterality: Left

## 2016-09-09 MED ORDER — FENTANYL CITRATE (PF) 100 MCG/2ML IJ SOLN
INTRAMUSCULAR | Status: AC
  Filled 2016-09-09: qty 2

## 2016-09-09 MED ORDER — LIDOCAINE HCL (CARDIAC) 20 MG/ML IV SOLN
INTRAVENOUS | Status: DC | PRN
  Administered 2016-09-09: 30 mg via INTRAVENOUS

## 2016-09-09 MED ORDER — BUPIVACAINE-EPINEPHRINE (PF) 0.5% -1:200000 IJ SOLN
INTRAMUSCULAR | Status: DC | PRN
Start: 2016-09-09 — End: 2016-09-09
  Administered 2016-09-09: 30 mL via PERINEURAL

## 2016-09-09 MED ORDER — PROMETHAZINE HCL 25 MG/ML IJ SOLN: 6.2500 mg | INTRAMUSCULAR | Status: DC | PRN

## 2016-09-09 MED ORDER — MIDAZOLAM HCL 2 MG/2ML IJ SOLN
INTRAMUSCULAR | Status: AC
  Filled 2016-09-09: qty 2

## 2016-09-09 MED ORDER — DOCUSATE SODIUM 100 MG PO CAPS: 100.0000 mg | ORAL_CAPSULE | Freq: Two times a day (BID) | ORAL | 0 refills | Status: DC

## 2016-09-09 MED ORDER — CHLORHEXIDINE GLUCONATE 4 % EX LIQD: 60.0000 mL | Freq: Once | CUTANEOUS | Status: DC

## 2016-09-09 MED ORDER — CEFAZOLIN SODIUM-DEXTROSE 2-4 GM/100ML-% IV SOLN
INTRAVENOUS | Status: AC
  Filled 2016-09-09: qty 100

## 2016-09-09 MED ORDER — MIDAZOLAM HCL 2 MG/2ML IJ SOLN
1.0000 mg | INTRAMUSCULAR | Status: DC | PRN
  Administered 2016-09-09: 2 mg via INTRAVENOUS

## 2016-09-09 MED ORDER — PHENYLEPHRINE HCL 10 MG/ML IJ SOLN
INTRAVENOUS | Status: DC | PRN
  Administered 2016-09-09: 20 ug/min via INTRAVENOUS

## 2016-09-09 MED ORDER — LACTATED RINGERS IV SOLN
INTRAVENOUS | Status: DC
  Administered 2016-09-09: 09:00:00 via INTRAVENOUS

## 2016-09-09 MED ORDER — DEXAMETHASONE SODIUM PHOSPHATE 10 MG/ML IJ SOLN
INTRAMUSCULAR | Status: DC | PRN
  Administered 2016-09-09: 10 mg via INTRAVENOUS

## 2016-09-09 MED ORDER — CEFAZOLIN SODIUM-DEXTROSE 2-4 GM/100ML-% IV SOLN
2.0000 g | INTRAVENOUS | Status: AC
  Administered 2016-09-09: 2 g via INTRAVENOUS

## 2016-09-09 MED ORDER — PROPOFOL 10 MG/ML IV BOLUS
INTRAVENOUS | Status: DC | PRN
  Administered 2016-09-09: 120 mg via INTRAVENOUS

## 2016-09-09 MED ORDER — FENTANYL CITRATE (PF) 100 MCG/2ML IJ SOLN
50.0000 ug | INTRAMUSCULAR | Status: DC | PRN
  Administered 2016-09-09: 50 ug via INTRAVENOUS

## 2016-09-09 MED ORDER — SENNA 8.6 MG PO TABS: 2.0000 | ORAL_TABLET | Freq: Two times a day (BID) | ORAL | 0 refills | Status: DC

## 2016-09-09 MED ORDER — FENTANYL CITRATE (PF) 100 MCG/2ML IJ SOLN
25.0000 ug | INTRAMUSCULAR | Status: DC | PRN
Start: 2016-09-09 — End: 2016-09-09

## 2016-09-09 MED ORDER — OXYCODONE HCL 5 MG PO TABS: 5.0000 mg | ORAL_TABLET | ORAL | 0 refills | Status: DC | PRN

## 2016-09-09 MED ORDER — ONDANSETRON HCL 4 MG/2ML IJ SOLN
INTRAMUSCULAR | Status: DC | PRN
  Administered 2016-09-09: 4 mg via INTRAVENOUS

## 2016-09-09 MED ORDER — SODIUM CHLORIDE 0.9 % IV SOLN: INTRAVENOUS | Status: DC

## 2016-09-09 MED ORDER — EPHEDRINE SULFATE 50 MG/ML IJ SOLN
INTRAMUSCULAR | Status: DC | PRN
  Administered 2016-09-09: 5 mg via INTRAVENOUS
  Administered 2016-09-09: 10 mg via INTRAVENOUS

## 2016-09-09 SURGICAL SUPPLY — 75 items
BANDAGE ESMARK 6X9 LF (GAUZE/BANDAGES/DRESSINGS) ×1 IMPLANT
BIT DRILL 1.8 CANN MAX VPC (BIT) ×2 IMPLANT
BLADE AVERAGE 25MMX9MM (BLADE)
BLADE AVERAGE 25X9 (BLADE) IMPLANT
BLADE LONG MED 25X9 (BLADE) ×2 IMPLANT
BLADE LONG MED 25X9MM (BLADE) ×1
BLADE OSC/SAG .038X5.5 CUT EDG (BLADE) IMPLANT
BLADE SURG 15 STRL LF DISP TIS (BLADE) ×2 IMPLANT
BLADE SURG 15 STRL SS (BLADE) ×6
BNDG CMPR 9X4 STRL LF SNTH (GAUZE/BANDAGES/DRESSINGS)
BNDG CMPR 9X6 STRL LF SNTH (GAUZE/BANDAGES/DRESSINGS) ×1
BNDG COHESIVE 4X5 TAN STRL (GAUZE/BANDAGES/DRESSINGS) ×3 IMPLANT
BNDG CONFORM 2 STRL LF (GAUZE/BANDAGES/DRESSINGS) IMPLANT
BNDG CONFORM 3 STRL LF (GAUZE/BANDAGES/DRESSINGS) ×3 IMPLANT
BNDG ESMARK 4X9 LF (GAUZE/BANDAGES/DRESSINGS) IMPLANT
BNDG ESMARK 6X9 LF (GAUZE/BANDAGES/DRESSINGS) ×3
CAP PIN PROTECTOR ORTHO WHT (CAP) IMPLANT
CHLORAPREP W/TINT 26ML (MISCELLANEOUS) ×3 IMPLANT
COVER BACK TABLE 60X90IN (DRAPES) ×3 IMPLANT
CUFF TOURNIQUET SINGLE 34IN LL (TOURNIQUET CUFF) IMPLANT
DRAPE EXTREMITY T 121X128X90 (DRAPE) ×3 IMPLANT
DRAPE OEC MINIVIEW 54X84 (DRAPES) ×3 IMPLANT
DRAPE U-SHAPE 47X51 STRL (DRAPES) ×3 IMPLANT
DRSG MEPITEL 4X7.2 (GAUZE/BANDAGES/DRESSINGS) ×3 IMPLANT
DRSG PAD ABDOMINAL 8X10 ST (GAUZE/BANDAGES/DRESSINGS) ×3 IMPLANT
ELECT REM PT RETURN 9FT ADLT (ELECTROSURGICAL) ×3
ELECTRODE REM PT RTRN 9FT ADLT (ELECTROSURGICAL) ×1 IMPLANT
GAUZE SPONGE 4X4 12PLY STRL (GAUZE/BANDAGES/DRESSINGS) ×3 IMPLANT
GLOVE BIO SURGEON STRL SZ8 (GLOVE) ×3 IMPLANT
GLOVE BIOGEL PI IND STRL 7.0 (GLOVE) IMPLANT
GLOVE BIOGEL PI IND STRL 8 (GLOVE) ×2 IMPLANT
GLOVE BIOGEL PI INDICATOR 7.0 (GLOVE) ×2
GLOVE BIOGEL PI INDICATOR 8 (GLOVE) ×4
GLOVE ECLIPSE 7.0 STRL STRAW (GLOVE) ×2 IMPLANT
GLOVE ECLIPSE 8.0 STRL XLNG CF (GLOVE) ×3 IMPLANT
GOWN STRL REUS W/ TWL LRG LVL3 (GOWN DISPOSABLE) ×1 IMPLANT
GOWN STRL REUS W/ TWL XL LVL3 (GOWN DISPOSABLE) ×2 IMPLANT
GOWN STRL REUS W/TWL LRG LVL3 (GOWN DISPOSABLE) ×3
GOWN STRL REUS W/TWL XL LVL3 (GOWN DISPOSABLE) ×6
K-WIRE .054X4 (WIRE) IMPLANT
K-WIRE COCR 0.9X95 (WIRE) ×3
KWIRE COCR 0.9X95 (WIRE) IMPLANT
NEEDLE HYPO 22GX1.5 SAFETY (NEEDLE) IMPLANT
NS IRRIG 1000ML POUR BTL (IV SOLUTION) ×3 IMPLANT
PACK BASIN DAY SURGERY FS (CUSTOM PROCEDURE TRAY) ×3 IMPLANT
PAD CAST 4YDX4 CTTN HI CHSV (CAST SUPPLIES) ×1 IMPLANT
PADDING CAST ABS 4INX4YD NS (CAST SUPPLIES)
PADDING CAST ABS COTTON 4X4 ST (CAST SUPPLIES) IMPLANT
PADDING CAST COTTON 4X4 STRL (CAST SUPPLIES) ×3
PASSER SUT SWANSON 36MM LOOP (INSTRUMENTS) IMPLANT
PENCIL BUTTON HOLSTER BLD 10FT (ELECTRODE) ×3 IMPLANT
SANITIZER HAND PURELL 535ML FO (MISCELLANEOUS) ×3 IMPLANT
SCREW HCS TWIST-OFF 2.0X12MM (Screw) ×4 IMPLANT
SCREW HCS TWIST-OFF 2.0X14MM (Screw) ×3 IMPLANT
SCREW MAX VPC  2.5X20 (Screw) ×2 IMPLANT
SCREW MAX VPC 2.5X20 (Screw) IMPLANT
SHEET MEDIUM DRAPE 40X70 STRL (DRAPES) ×3 IMPLANT
SLEEVE SCD COMPRESS KNEE MED (MISCELLANEOUS) ×3 IMPLANT
SPONGE LAP 18X18 X RAY DECT (DISPOSABLE) ×3 IMPLANT
STOCKINETTE 6  STRL (DRAPES) ×2
STOCKINETTE 6 STRL (DRAPES) ×1 IMPLANT
SUCTION FRAZIER HANDLE 10FR (MISCELLANEOUS) ×2
SUCTION TUBE FRAZIER 10FR DISP (MISCELLANEOUS) ×1 IMPLANT
SUT ETHILON 3 0 PS 1 (SUTURE) ×3 IMPLANT
SUT MNCRL AB 3-0 PS2 18 (SUTURE) ×3 IMPLANT
SUT VIC AB 2-0 SH 27 (SUTURE)
SUT VIC AB 2-0 SH 27XBRD (SUTURE) IMPLANT
SUT VICRYL 0 UR6 27IN ABS (SUTURE) IMPLANT
SYR BULB 3OZ (MISCELLANEOUS) ×3 IMPLANT
SYR CONTROL 10ML LL (SYRINGE) IMPLANT
TOWEL OR 17X24 6PK STRL BLUE (TOWEL DISPOSABLE) ×3 IMPLANT
TUBE CONNECTING 20'X1/4 (TUBING) ×1
TUBE CONNECTING 20X1/4 (TUBING) ×2 IMPLANT
UNDERPAD 30X30 (UNDERPADS AND DIAPERS) ×3 IMPLANT
YANKAUER SUCT BULB TIP NO VENT (SUCTIONS) IMPLANT

## 2016-09-09 NOTE — Anesthesia Postprocedure Evaluation (Signed)
Anesthesia Post Note  Patient: Cristina Garcia  Procedure(s) Performed: Procedure(s) (LRB): Left 2-4 MT Weil osteotomies; Left 2nd hammertoe correction; possible flexor to extensor transfer (Left)  Patient location during evaluation: PACU Anesthesia Type: General and Regional Level of consciousness: sedated and patient cooperative Pain management: pain level controlled Vital Signs Assessment: post-procedure vital signs reviewed and stable Respiratory status: spontaneous breathing Cardiovascular status: stable Anesthetic complications: no       Last Vitals:  Vitals:   09/09/16 1107 09/09/16 1145  BP: 110/74 109/68  Pulse: 85 86  Resp: 20 16  Temp:  36.9 C    Last Pain:  Vitals:   09/09/16 0900  TempSrc: Oral                 Nolon Nations

## 2016-09-09 NOTE — Anesthesia Preprocedure Evaluation (Signed)
Anesthesia Evaluation  Patient identified by MRN, date of birth, ID band Patient awake    Reviewed: Allergy & Precautions, NPO status , Patient's Chart, lab work & pertinent test results, reviewed documented beta blocker date and time   Airway Mallampati: II  TM Distance: >3 FB Neck ROM: Full    Dental  (+) Teeth Intact, Dental Advisory Given   Pulmonary neg pulmonary ROS,    Pulmonary exam normal breath sounds clear to auscultation       Cardiovascular hypertension, Pt. on home beta blockers and Pt. on medications Normal cardiovascular exam+ dysrhythmias (Atrial tachycardia) + Valvular Problems/Murmurs (s/p MVR) MR  Rhythm:Regular Rate:Normal     Neuro/Psych PSYCHIATRIC DISORDERS Anxiety negative neurological ROS     GI/Hepatic negative GI ROS, Neg liver ROS,   Endo/Other  negative endocrine ROS  Renal/GU Renal InsufficiencyRenal disease     Musculoskeletal negative musculoskeletal ROS (+)   Abdominal   Peds  Hematology negative hematology ROS (+) h/o multiple myeloma--clinical remission   Anesthesia Other Findings Day of surgery medications reviewed with the patient.  Reproductive/Obstetrics                             Anesthesia Physical Anesthesia Plan  ASA: III  Anesthesia Plan: General   Post-op Pain Management:  Regional for Post-op pain   Induction: Intravenous  Airway Management Planned: LMA  Additional Equipment:   Intra-op Plan:   Post-operative Plan: Extubation in OR  Informed Consent: I have reviewed the patients History and Physical, chart, labs and discussed the procedure including the risks, benefits and alternatives for the proposed anesthesia with the patient or authorized representative who has indicated his/her understanding and acceptance.   Dental advisory given  Plan Discussed with: CRNA  Anesthesia Plan Comments: (GA plus popliteal/saphenous nerve  block)        Anesthesia Quick Evaluation

## 2016-09-09 NOTE — Brief Op Note (Signed)
09/09/2016  10:41 AM  PATIENT:  Cristina Garcia  56 y.o. female  PRE-OPERATIVE DIAGNOSIS:  Left 2-4 metatarsalgia and 2nd hammertoe  POST-OPERATIVE DIAGNOSIS:  Left 2-4 metatarsalgia and 2nd hammertoe  Procedure(s): 1.  Left 2-4 MT Weil osteotomies (separate incisions) 2.  Left 2nd hammertoe correction (separate incision) 3.  Left 2nd MT dorsal capsulotomy and extensor tendon lengthening 4.  Left foot AP and lateral xrays  SURGEON:  Wylene Simmer, MD  ASSISTANT:  Mechele Claude, PA-C  ANESTHESIA:   General, regional  EBL:  minimal   TOURNIQUET:   Total Tourniquet Time Documented: Thigh (Right) - 36 minutes Total: Thigh (Right) - 36 minutes  COMPLICATIONS:  None apparent  DISPOSITION:  Extubated, awake and stable to recovery.  DICTATION ID:  [id number]

## 2016-09-09 NOTE — Progress Notes (Signed)
Assisted Dr. Turk with left, ultrasound guided, popliteal/saphenous block. Side rails up, monitors on throughout procedure. See vital signs in flow sheet. Tolerated Procedure well. 

## 2016-09-09 NOTE — Anesthesia Procedure Notes (Signed)
Procedure Name: LMA Insertion Date/Time: 09/09/2016 9:47 AM Performed by: Shayn Madole D Pre-anesthesia Checklist: Patient identified, Emergency Drugs available, Suction available and Patient being monitored Patient Re-evaluated:Patient Re-evaluated prior to inductionOxygen Delivery Method: Circle system utilized Preoxygenation: Pre-oxygenation with 100% oxygen Intubation Type: IV induction Ventilation: Mask ventilation without difficulty LMA: LMA inserted LMA Size: 3.0 Number of attempts: 1 Airway Equipment and Method: Bite block Placement Confirmation: positive ETCO2 Tube secured with: Tape Dental Injury: Teeth and Oropharynx as per pre-operative assessment

## 2016-09-09 NOTE — Discharge Instructions (Addendum)
Cristina Simmer, MD [place]  Please read the following information regarding your care after surgery.  Medications  You only need a prescription for the narcotic pain medicine (ex. oxycodone, Percocet, Norco).  All of the other medicines listed below are available over the counter. X acetominophen (Tylenol) 650 mg every 4-6 hours as you need for minor pain X oxycodone as prescribed for moderate to severe pain X aleve 220 mg - 2 tablets twice daily with food as needed for pain.   Narcotic pain medicine (ex. oxycodone, Percocet, Vicodin) will cause constipation.  To prevent this problem, take the following medicines while you are taking any pain medicine.    Regional Anesthesia Blocks  1. Numbness or the inability to move the "blocked" extremity may last from 3-48 hours after placement. The length of time depends on the medication injected and your individual response to the medication. If the numbness is not going away after 48 hours, call your surgeon.  2. The extremity that is blocked will need to be protected until the numbness is gone and the  Strength has returned. Because you cannot feel it, you will need to take extra care to avoid injury. Because it may be weak, you may have difficulty moving it or using it. You may not know what position it is in without looking at it while the block is in effect.  3. For blocks in the legs and feet, returning to weight bearing and walking needs to be done carefully. You will need to wait until the numbness is entirely gone and the strength has returned. You should be able to move your leg and foot normally before you try and bear weight or walk. You will need someone to be with you when you first try to ensure you do not fall and possibly risk injury.  4. Bruising and tenderness at the needle site are common side effects and will resolve in a few days.  5. Persistent numbness or new problems with movement should be communicated to the  surgeon or the [place] [phone]). X docusate sodium (Colace) 100 mg twice a day X senna (Senokot) 2 tablets twice a day  Weight Bearing X Bear weight when you are able on your operated leg or foot in the post-op shoe.  Dressing X Keep your dressing clean and dry.  Dont put anything (coat hanger, pencil, etc) down inside of it.  If it gets damp, use a hair dryer on the cool setting to dry it.  If it gets soaked, call the office to schedule an appointment for a cast change.   After your dressing, cast or splint is removed; you may shower, but do not soak or scrub the wound.  Allow the water to run over it, and then gently pat it dry.  Swelling It is normal for you to have swelling where you had surgery.  To reduce swelling and pain, keep your toes above your nose for at least 3 days after surgery.  It may be necessary to keep your foot or leg elevated for several weeks.  If it hurts, it should be elevated.  Follow Up Call my office at [phone] when you are discharged from the hospital or surgery center to schedule an appointment to be seen two weeks after surgery.  Call my office at [phone] if you develop a fever >101.5 F, nausea, vomiting, bleeding from the surgical site or severe pain.

## 2016-09-09 NOTE — Op Note (Signed)
Cristina Garcia, Cristina Garcia NO.:  [id number]  MEDICAL RECORD NO.:  [id number]  LOCATION:                                 FACILITY:  PHYSICIAN:  Wylene Simmer, MD        DATE OF BIRTH:  1960-12-18  DATE OF PROCEDURE:  09/09/2016 DATE OF DISCHARGE:                              OPERATIVE REPORT   PREOPERATIVE DIAGNOSES: 1. Left second, third, and fourth metatarsal metatarsalgia. 2. Left second hammertoe deformity.  POSTOPERATIVE DIAGNOSES: 1. Left second, third, and fourth metatarsal metatarsalgia. 2. Left second hammertoe deformity.  PROCEDURE: 1. Left second, third, and fourth metatarsal Weil osteotomies through     separate incisions. 2. Left second hammertoe correction through a separate incision. 3. Left second metatarsal dorsal capsulotomy and extensor tendon     lengthening. 4. Left foot AP and lateral radiographs.  SURGEON:  Wylene Simmer, MD  ASSISTANT:  Mechele Claude, PA-C.  ANESTHESIA:  General, regional.  ESTIMATED BLOOD LOSS:  Minimal.  TOURNIQUET TIME:  36 minutes at 250 mmHg.  COMPLICATIONS:  None apparent.  DISPOSITION:  Extubated, awake, and stable to Recovery.  INDICATIONS FOR PROCEDURE:  The patient is a 56 year old woman who has a painful left forefoot.  She has metatarsalgia as well as a painful second hammertoe deformity.  She has failed nonoperative treatment to date including activity modification, oral anti-inflammatories, and shoe wear modification.  She presents today for surgical correction of these painful deformities.  She understands the risks and benefits of the alternative treatment options and elects surgical treatment.  She specifically understands risks of bleeding, infection, nerve damage, blood clots, need for additional surgery, continued pain, nonunion, amputation, and death.  PROCEDURE IN DETAIL:  After preoperative consent was obtained and the correct operative site was identified, the patient was brought to  the operating room and placed supine on the operating table.  General anesthesia was induced.  Preoperative antibiotics were administered. Surgical time-out was taken.  Left lower extremity was prepped and draped in standard sterile fashion with tourniquet around the thigh. The extremity was exsanguinated and tourniquet was inflated to 250 mmHg. A longitudinal incision was then made over the second MTP joint.  Sharp dissection was carried down through skin and subcutaneous tissue.  The dorsal joint capsule was incised and elevated medially and laterally. The second metatarsal head was exposed.  The oscillating saw was used to create a Weil osteotomy, removing a small wedge of bone distally.  The metatarsal head was allowed to retract proximally several millimeters and was fixed with a 2-mm Biomet FRS screw.  The overhanging bone was trimmed with a rongeur.  Attention was then turned to the third MTP joint where the same procedure was performed through a separate incision.  The osteotomy was again fixed with a 2-mm Biomet FRS screw.  The same procedure was performed at the fourth MTP joint through a separate incision and again fixed with a Biomet FRS screw.  Attention was then turned to the second toe where transverse incision was made over the PIP joint.  Sharp dissection was carried down through the skin and subcutaneous tissue.  The extensor mechanism was incised. The head of the  proximal phalanx was resected with the oscillating saw followed by the base of the middle phalanx.  The wound was irrigated. The arthrodesis was then fixed with a Biomet VPC screw.  The forefoot was then examined.  The extensor tendons were noted to be tight over the second toe.  They were lengthened in Z-fashion, and the dorsal joint capsule was excised in its entirety.  This allowed the toe to sit in a neutral position.  The extensor tendons were repaired with Monocryl. The subcutaneous tissues were  approximated with Monocryl.  Final AP and lateral radiographs confirmed appropriate position and length of all hardware and appropriate correction of the forefoot deformities.  The wounds were closed with nylon.  Sterile dressings were applied followed by a compression wrap.  Tourniquet was released after application of the dressings at 36 minutes.  The patient was awakened from anesthesia and transported to the recovery room in stable condition.  FOLLOWUP PLAN:  The patient will be weightbearing as tolerated on her left heel in a Darco Wedge shoe.  She will follow up with me in the office in 2 weeks for suture removal and to begin taping the second toe down.  RADIOGRAPHS:  AP and lateral radiographs of the left foot were obtained intraoperatively.  These show interval second, third, and fourth metatarsal Weil osteotomies and correction of her hammertoe deformity. Hardwares are appropriately positioned and of the appropriate lengths. No other acute injuries are noted.  Mechele Claude, PA-C, was present and scrubbed for the duration of the case.  His assistance was essential in positioning the patient, prepping and draping, gaining and maintaining exposure, performing the operation, closing and dressing the wounds, and applying the splint.     Wylene Simmer, MD     JH/MEDQ  D:  09/09/2016  T:  09/09/2016  Job:  [id number]

## 2016-09-09 NOTE — Transfer of Care (Signed)
Immediate Anesthesia Transfer of Care Note  Patient: Cristina Garcia  Procedure(s) Performed: Procedure(s): Left 2-4 MT Weil osteotomies; Left 2nd hammertoe correction; possible flexor to extensor transfer (Left)  Patient Location: PACU  Anesthesia Type:GA combined with regional for post-op pain  Level of Consciousness: awake, alert , oriented and patient cooperative  Airway & Oxygen Therapy: Patient Spontanous Breathing and Patient connected to face mask oxygen  Post-op Assessment: Report given to RN and Post -op Vital signs reviewed and stable  Post vital signs: Reviewed and stable  Last Vitals:  Vitals:   09/09/16 0930 09/09/16 0935  BP:    Pulse: 79 77  Resp: 10 11  Temp:      Last Pain:  Vitals:   09/09/16 0900  TempSrc: Oral         Complications: No apparent anesthesia complications

## 2016-09-09 NOTE — Anesthesia Procedure Notes (Signed)
Anesthesia Regional Block: Popliteal block   Pre-Anesthetic Checklist: ,, timeout performed, Correct Patient, Correct Site, Correct Laterality, Correct Procedure, Correct Position, site marked, Risks and benefits discussed,  Surgical consent,  Pre-op evaluation,  At surgeon's request and post-op pain management  Laterality: Left  Prep: chloraprep       Needles:  Injection technique: Single-shot  Needle Type: Echogenic Needle     Needle Length: 9cm  Needle Gauge: 21     Additional Needles:   Procedures: ultrasound guided,,,,,,,,  Narrative:  Start time: 09/09/2016 9:25 AM End time: 09/09/2016 9:32 AM Injection made incrementally with aspirations every 5 mL.  Performed by: Personally  Anesthesiologist: Catalina Gravel  Additional Notes: No pain on injection. No increased resistance to injection. Injection made in 5cc increments.  Good needle visualization.  Patient tolerated procedure well.  Combined LEFT POPLITEAL/SAPHENOUS Nerve block.

## 2016-09-09 NOTE — H&P (Signed)
Cristina Garcia is an 56 y.o. female.   Chief Complaint:  Left foot pain HPI:  56 y/o female with left foot pain for the last few years.  She has metatarsalgia and a 2nd hammertoe.  She has failed non op treatment and presents today for surgical correction of the forefoot pain.  Past Medical History:  Diagnosis Date  . Allergy   . Anxiety   . Atrial tachycardia ([place]) 12/18/2014   24 hour event monitor with frequent PAC's, PVC's and short runs of atrial tachycardia - no sustained arrhythmias or atrial fibrillation   . Chronic kidney disease (CKD)   . Constipation   . H/O multiple myeloma    clinical remission  . Hypertension   . Mitral regurgitation   . Mitral valve prolapse   . Osteoporosis   . Pleural effusion, right 11/03/2015  . S/P minimally invasive mitral valve repair 10/15/2015   Complex valvuloplasty including Alfieri edge-to-edge repair, generous quadrangular resection (shortening) of posterior leaflet, sliding leaflet plasty, Gore-tex neochord placement x6, and 38 mm Edwards Physio II ring annuloplasty via right mini thoracotomy approach     Past Surgical History:  Procedure Laterality Date  . BREAST ENHANCEMENT SURGERY  2000  . CARDIAC CATHETERIZATION N/A 08/18/2015   Procedure: Right/Left Heart Cath and Coronary Angiography;  Surgeon: Larey Dresser, MD;  Location: [facility];  Service: Cardiovascular;  Laterality: N/A;  . [place]  . LIMBAL STEM CELL TRANSPLANT  2008  . MITRAL VALVE REPAIR Right 10/15/2015   Procedure: MINIMALLY INVASIVE MITRAL VALVE REPAIR (MVR);  Surgeon: Rexene Alberts, MD;  Location: [facility];  Service: Open Heart Surgery;  Laterality: Right;  . OOPHORECTOMY  1991   left  . TEE WITHOUT CARDIOVERSION N/A 09/05/2013   Procedure: TRANSESOPHAGEAL ECHOCARDIOGRAM (TEE);  Surgeon: Larey Dresser, MD;  Location: [facility];  Service: Cardiovascular;  Laterality: N/A;  . TEE WITHOUT CARDIOVERSION N/A 08/18/2015   Procedure: TRANSESOPHAGEAL  ECHOCARDIOGRAM (TEE);  Surgeon: Larey Dresser, MD;  Location: [facility];  Service: Cardiovascular;  Laterality: N/A;  . TEE WITHOUT CARDIOVERSION N/A 10/15/2015   Procedure: TRANSESOPHAGEAL ECHOCARDIOGRAM (TEE);  Surgeon: Rexene Alberts, MD;  Location: [facility];  Service: Open Heart Surgery;  Laterality: N/A;  . WISDOM TOOTH EXTRACTION  1975    Family History  Problem Relation Age of Onset  . Non-Hodgkin's lymphoma Mother   . Cancer Mother   . Congestive Heart Failure Mother   . Rheumatic fever Mother     with mitral valve replacment  . Hypertension Father   . Diabetes      family hx  . Hypertension      family hx  . Lymphoma      family hx  . Colon cancer Neg Hx   . Stomach cancer Neg Hx    Social History:  reports that she has never smoked. She has never used smokeless tobacco. She reports that she drinks about 1.2 oz of alcohol per week . She reports that she does not use drugs.  Allergies: No Known Allergies  Medications Prior to Admission  Medication Sig Dispense Refill  . aspirin 81 MG tablet Take 81 mg by mouth daily. Reported on 10/08/2015    . calcium carbonate (OS-CAL) 600 MG TABS Take 600 mg by mouth daily with breakfast.     . cetirizine (ZYRTEC) 10 MG tablet Take 10 mg by mouth daily.    . ferrous sulfate 325 (65 FE) MG tablet Take 325  mg by mouth daily.    Marland Kitchen lisinopril (PRINIVIL,ZESTRIL) 5 MG tablet TAKE 1 TABLET BY MOUTH EVERY MORNING 90 tablet 1  . metoprolol succinate (TOPROL-XL) 25 MG 24 hr tablet Take 25 mg by mouth daily before breakfast.    . Multiple Vitamins-Minerals (MULTIVITAL PO) Take 1 tablet by mouth daily.      Marland Kitchen acyclovir (ZOVIRAX) 200 MG capsule Take 200 mg by mouth daily as needed (BREAKOUTS). Reported on 12/05/2015    . amoxicillin-clavulanate (AUGMENTIN) 875-125 MG tablet Take 1 tablet by mouth every 12 (twelve) hours. 14 tablet 0  . Estrogens, Conjugated (PREMARIN VA) Place 1 application vaginally daily as needed (dryness).     Marland Kitchen loratadine  (CLARITIN) 10 MG tablet Take 10 mg by mouth daily.      No results found for this or any previous visit (from the past 48 hour(s)). No results found.  ROS no recent f/c/n/v/wt loss  Blood pressure (!) 88/50, pulse 85, temperature 97.9 F (36.6 C), temperature source Oral, resp. rate 16, height '5\' 9"'  (1.753 m), weight 59.5 kg (131 lb 2 oz), SpO2 100 %. Physical Exam  wn wd woman in nad.  A a nd O x4.  Mood and affect normal.  EOMI.  resp unlabored.  L foot with 2nd hammertoe and prominent metatarsal heads.  5/5 strength in PF and DF of the toes and ankle.  Sens to LT intact at the forefoot.  No lymphadenopathy.  Skin o/w healthy and intact.  Assessment/Plan L forefoot painful 2nd hammertoe and metatarsalgia - to OR for 2-4 MT weil osteotomies and 2nd hammertoe correction.  The risks and benefits of the alternative treatment options have been discussed in detail.  The patient wishes to proceed with surgery and specifically understands risks of bleeding, infection, nerve damage, blood clots, need for additional surgery, amputation and death.   Wylene Simmer, MD 14-Sep-2016, 9:25 AM

## 2016-09-13 ENCOUNTER — Encounter (HOSPITAL_BASED_OUTPATIENT_CLINIC_OR_DEPARTMENT_OTHER): Payer: Self-pay | Admitting: Orthopedic Surgery

## 2016-09-16 ENCOUNTER — Encounter (HOSPITAL_BASED_OUTPATIENT_CLINIC_OR_DEPARTMENT_OTHER): Payer: Self-pay | Admitting: Orthopedic Surgery

## 2016-10-12 ENCOUNTER — Telehealth: Payer: Self-pay | Admitting: Oncology

## 2016-10-12 ENCOUNTER — Other Ambulatory Visit (HOSPITAL_BASED_OUTPATIENT_CLINIC_OR_DEPARTMENT_OTHER): Payer: BLUE CROSS/BLUE SHIELD

## 2016-10-12 ENCOUNTER — Ambulatory Visit (HOSPITAL_BASED_OUTPATIENT_CLINIC_OR_DEPARTMENT_OTHER): Payer: BLUE CROSS/BLUE SHIELD | Admitting: Oncology

## 2016-10-12 VITALS — BP 99/70 | HR 80 | Temp 98.1°F | Resp 16 | Ht 69.0 in | Wt 132.9 lb

## 2016-10-12 DIAGNOSIS — C9001 Multiple myeloma in remission: Secondary | ICD-10-CM | POA: Diagnosis not present

## 2016-10-12 DIAGNOSIS — Z23 Encounter for immunization: Secondary | ICD-10-CM

## 2016-10-12 LAB — CBC WITH DIFFERENTIAL/PLATELET
BASO%: 0.6 % (ref 0.0–2.0)
Basophils Absolute: 0 10*3/uL (ref 0.0–0.1)
EOS%: 1.5 % (ref 0.0–7.0)
Eosinophils Absolute: 0.1 10*3/uL (ref 0.0–0.5)
HEMATOCRIT: 46.2 % (ref 34.8–46.6)
HGB: 15.7 g/dL (ref 11.6–15.9)
LYMPH#: 1.6 10*3/uL (ref 0.9–3.3)
LYMPH%: 30 % (ref 14.0–49.7)
MCH: 32.5 pg (ref 25.1–34.0)
MCHC: 33.9 g/dL (ref 31.5–36.0)
MCV: 95.8 fL (ref 79.5–101.0)
MONO#: 0.5 10*3/uL (ref 0.1–0.9)
MONO%: 8.9 % (ref 0.0–14.0)
NEUT#: 3.1 10*3/uL (ref 1.5–6.5)
NEUT%: 59 % (ref 38.4–76.8)
Platelets: 161 10*3/uL (ref 145–400)
RBC: 4.82 10*6/uL (ref 3.70–5.45)
RDW: 12.8 % (ref 11.2–14.5)
WBC: 5.2 10*3/uL (ref 3.9–10.3)

## 2016-10-12 LAB — BASIC METABOLIC PANEL
ANION GAP: 8 meq/L (ref 3–11)
BUN: 23.9 mg/dL (ref 7.0–26.0)
CO2: 27 meq/L (ref 22–29)
Calcium: 10.1 mg/dL (ref 8.4–10.4)
Chloride: 104 mEq/L (ref 98–109)
Creatinine: 1.2 mg/dL — ABNORMAL HIGH (ref 0.6–1.1)
EGFR: 48 mL/min/{1.73_m2} — AB (ref >90)
GLUCOSE: 94 mg/dL (ref 70–140)
Potassium: 3.9 mEq/L (ref 3.5–5.1)
Sodium: 138 mEq/L (ref 136–145)

## 2016-10-12 NOTE — Telephone Encounter (Signed)
Gave patient avs report and appointments for November.  °

## 2016-10-12 NOTE — Progress Notes (Signed)
  [facility] OFFICE PROGRESS NOTE   Diagnosis: Multiple myeloma  INTERVAL HISTORY:   [name] returns as scheduled. She feels well. No pain. She underwent left foot surgery on 09/09/2016 for correction of a hammertoe deformity and metatarsalgia. She remains in a left foot boot.  A bone survey 04/05/2016 revealed no evidence of progressive myeloma. Stable lesions were noted at the left iliac.  Objective:  Vital signs in last 24 hours:  Blood pressure 99/70, pulse 80, temperature 98.1 F (36.7 C), temperature source Oral, resp. rate 16, height '5\' 9"'$  (1.753 m), weight 132 lb 14.4 oz (60.3 kg), SpO2 100 %.    Resp: Lungs clear bilaterally Cardio: Regular rate and rhythm GI: No hepatosplenomegaly Vascular: Trace edema at the left ankle and lower leg proximal to the ankle, the remainder the left leg does not appear swollen Musculoskeletal: No tenderness at the left iliac    Lab Results:  Lab Results  Component Value Date   WBC 5.2 10/12/2016   HGB 15.7 10/12/2016   HCT 46.2 10/12/2016   MCV 95.8 10/12/2016   PLT 161 10/12/2016   NEUTROABS 3.1 10/12/2016    CMP     Component Value Date/Time   NA 136 04/15/2016 1504   K 4.3 04/15/2016 1504   CL 99 12/19/2015 1620   CL 102 07/25/2012 1532   CO2 25 04/15/2016 1504   GLUCOSE 90 04/15/2016 1504   GLUCOSE 70 07/25/2012 1532   BUN 26.5 (H) 04/15/2016 1504   CREATININE 1.2 (H) 04/15/2016 1504   CALCIUM 9.5 04/15/2016 1504   PROT 6.5 04/15/2016 1504   PROT 6.7 07/30/2014 0813   ALBUMIN 4.3 10/10/2015 1344   ALBUMIN 4.1 07/30/2014 0813   AST 25 10/10/2015 1344   AST 23 07/30/2014 0813   ALT 19 10/10/2015 1344   ALT 18 07/30/2014 0813   ALKPHOS 49 10/10/2015 1344   ALKPHOS 58 07/30/2014 0813   BILITOT 0.9 10/10/2015 1344   BILITOT 1.00 07/30/2014 0813   GFRNONAA 53 (L) 10/21/2015 0315   GFRAA >60 10/21/2015 0315   Kappa free light chains on 04/15/2016-16.5  Medications: I have reviewed the patient's  current medications.  Assessment/Plan: 1. Multiple myeloma, IgA kappa, status post high-dose chemotherapy with autologous stem cell support at [facility] in June 2008. She remains in clinical remission. 2. history of Renal insufficiency secondary to multiple myeloma.  3. History of hypokalemia. 4. History of left pelvic pain secondary to a lytic left iliac lesion. A metastatic bone survey was unchanged 04/05/2016 5. Simple cyst at the lower pole of the right kidney on a renal ultrasound, 11/08/2006. 6. Multiseptated cyst of the left adnexa on an ultrasound, 11/08/2006. 7. Osteopenia 8. Mitral valve prolapse/mitral regurgitation-minimally invasive mitral valve repair 10/15/2015 9. Report of intermittent rectal bleeding. Normal colonoscopy by Dr. Fuller Plan on 08/20/2011.    Disposition:  [name] remains in clinical remission from multiple myeloma. We will follow-up on the myeloma panel from today. She will return for an office and lab visit in 6 months. I have a low clinical suspicion for a left leg DVT. She will follow-up with her orthopedic physician and seek medical attention for edema or pain at the left leg.  15 minutes were spent with the patient today. The majority of the time was used for counseling and coordination of care.  [name], MD  10/12/2016  3:51 PM

## 2016-10-13 ENCOUNTER — Other Ambulatory Visit: Payer: BLUE CROSS/BLUE SHIELD

## 2016-10-13 LAB — KAPPA/LAMBDA LIGHT CHAINS
IG KAPPA FREE LIGHT CHAIN: 15.5 mg/L (ref 3.3–19.4)
Ig Lambda Free Light Chain: 11 mg/L (ref 5.7–26.3)
Kappa/Lambda FluidC Ratio: 1.41 (ref 0.26–1.65)

## 2016-10-14 LAB — PROTEIN ELECTROPHORESIS, SERUM
A/G Ratio: 1.6 (ref 0.7–1.7)
ALPHA 1: 0.2 g/dL (ref 0.0–0.4)
Albumin: 3.9 g/dL (ref 2.9–4.4)
Alpha 2: 0.6 g/dL (ref 0.4–1.0)
Beta: 0.8 g/dL (ref 0.7–1.3)
Gamma Globulin: 0.8 g/dL (ref 0.4–1.8)
Globulin, Total: 2.5 g/dL (ref 2.2–3.9)

## 2016-10-18 LAB — IMMUNOFIXATION ELECTROPHORESIS
IGG (IMMUNOGLOBIN G), SERUM: 718 mg/dL (ref 700–1600)
IgA, Qn, Serum: 97 mg/dL (ref 87–352)
IgM, Qn, Serum: 65 mg/dL (ref 26–217)
Total Protein: 6.4 g/dL (ref 6.0–8.5)

## 2016-10-22 DIAGNOSIS — M216X2 Other acquired deformities of left foot: Secondary | ICD-10-CM | POA: Diagnosis not present

## 2016-10-22 DIAGNOSIS — M2042 Other hammer toe(s) (acquired), left foot: Secondary | ICD-10-CM | POA: Diagnosis not present

## 2016-10-25 ENCOUNTER — Ambulatory Visit (INDEPENDENT_AMBULATORY_CARE_PROVIDER_SITE_OTHER): Payer: BLUE CROSS/BLUE SHIELD | Admitting: Thoracic Surgery (Cardiothoracic Vascular Surgery)

## 2016-10-25 ENCOUNTER — Encounter: Payer: Self-pay | Admitting: Thoracic Surgery (Cardiothoracic Vascular Surgery)

## 2016-10-25 VITALS — BP 106/79 | HR 88 | Resp 20 | Ht 69.0 in | Wt 132.0 lb

## 2016-10-25 DIAGNOSIS — Z9889 Other specified postprocedural states: Secondary | ICD-10-CM

## 2016-10-25 NOTE — Progress Notes (Signed)
[address]       [place] [address]             [phone]     CARDIOTHORACIC SURGERY OFFICE NOTE  Referring Provider is Larey Dresser, MD PCP is Dorena Cookey, MD   HPI:  Patient is a 56 year old female who returns to the office today for routine follow-up approximately one year status post minimally invasive mitral valve repair for mitral valve prolapse with severe symptomatic primary mitral regurgitation on 10/15/2015. Her early postoperative convalescence was uneventful and she was last seen here in our office on 12/29/2015. Since then she has continued to do well from a cardiac standpoint.  She was last seen in follow-up by Dr. Aundra Dubin on 03/31/2016, and recently she underwent left foot surgery by Dr. Doran Durand. She reports that she has done exceptionally well from a cardiac standpoint. Although she is a little bit limited because of her problems with her foot, she reports essentially no symptoms of exertional shortness of breath or chest discomfort. In addition, she has not had any palpitations which she used to experienced fairly regularly prior to mitral valve repair. She states that her exercise tolerance is good and she has had no problems whatsoever. She does complain that Toprol seems to make her feel tired. Her blood pressure runs low chronically.   Current Outpatient Prescriptions  Medication Sig Dispense Refill  . acyclovir (ZOVIRAX) 200 MG capsule Take 200 mg by mouth daily as needed (BREAKOUTS). Reported on 12/05/2015    . aspirin 81 MG tablet Take 81 mg by mouth daily. Reported on 10/08/2015    . calcium carbonate (OS-CAL) 600 MG TABS Take 600 mg by mouth daily with breakfast.     . cetirizine (ZYRTEC) 10 MG tablet Take 10 mg by mouth daily.    . Estrogens, Conjugated (PREMARIN VA) Place 1 application vaginally daily as needed (dryness).     Marland Kitchen lisinopril (PRINIVIL,ZESTRIL) 5 MG tablet TAKE 1 TABLET BY MOUTH EVERY MORNING 90 tablet 1  . metoprolol  succinate (TOPROL-XL) 25 MG 24 hr tablet Take 25 mg by mouth daily before breakfast.    . Multiple Vitamins-Minerals (MULTIVITAL PO) Take 1 tablet by mouth daily.      . ferrous sulfate 325 (65 FE) MG tablet Take 325 mg by mouth daily.     No current facility-administered medications for this visit.       Physical Exam:   BP 106/79   Pulse 88   Resp 20   Ht '5\' 9"'  (1.753 m)   Wt 132 lb (59.9 kg)   SpO2 99%   BMI 19.49 kg/m   General:  Well-appearing  Chest:   Clear to auscultation  CV:   Regular rate and rhythm without murmur  Incisions:  Completely healed  Abdomen:  Soft nontender  Extremities:  Warm and well-perfused  Diagnostic Tests:  Transthoracic Echocardiography  Patient:    Cristina Garcia, Cristina Garcia MR #:       [id number] Study Date: 12/05/2015 Gender:     F Age:        32 Height:     175.3 cm Weight:     59 kg BSA:        1.69 m^2 Pt. Status: Room:   ATTENDING    [name], M.D.  ORDERING     [name], M.D.  REFERRING    [name], M.D.  SONOGRAPHER  Donata Clay  PERFORMING   [facility] Outpatient  cc:  ------------------------------------------------------------------- LV EF: 45%  ------------------------------------------------------------------- Indications:      424.0 Mitral valve disease.  ------------------------------------------------------------------- History:   PMH:  S/P mitral valve repair. H/O bileaflet mitral valve prolapse. Hypertension. H/O multiple myeloma.  ------------------------------------------------------------------- Study Conclusions  - Left ventricle: The cavity size was mildly dilated. Wall   thickness was normal. The estimated ejection fraction was 45%.   Diffuse hypokinesis. The study is not technically sufficient to   allow evaluation of LV diastolic function. - Mitral valve: Post repair with annuloplasty ring. Unusual post op   appearence with doming and tethering of the anterior leaflet   tip at the  chordal level Mild diastolic gradient no signficant MS   by Pt1/2 Trivial residual MR. Valve area by continuity equation   (using LVOT flow): 0.82 cm^2. - Atrial septum: No defect or patent foramen ovale was identified. - Pulmonary arteries: PA peak pressure: 48 mm Hg (S).  Transthoracic echocardiography.  M-mode, complete 2D, spectral Doppler, and color Doppler.  Birthdate:  Patient birthdate: [birth year].  Age:  Patient is 56 yr old.  Sex:  Gender: female. BMI: 19.2 kg/m^2.  Blood pressure:     135/94  Patient status: Outpatient.  Study date:  Study date: 12/05/2015. Study time: 02:22 PM.  [facility]  -------------------------------------------------------------------  ------------------------------------------------------------------- Left ventricle:  The cavity size was mildly dilated. Wall thickness was normal. The estimated ejection fraction was 45%. Diffuse hypokinesis. The study is not technically sufficient to allow evaluation of LV diastolic function.  ------------------------------------------------------------------- Aortic valve:   Mildly thickened leaflets.  Doppler:   There was no stenosis.  ------------------------------------------------------------------- Aorta:  The aorta was normal, not dilated, and non-diseased.  ------------------------------------------------------------------- Mitral valve:  Post repair with annuloplasty ring. Unusual post op appearence with doming and tethering of the anterior leaflet tip at the chordal level Mild diastolic gradient no signficant MS by Pt1/2 Trivial residual MR.  Doppler:     Valve area by pressure half-time: 3.24 cm^2. Indexed valve area by pressure half-time: 1.92 cm^2/m^2. Valve area by continuity equation (using LVOT flow): 0.82 cm^2. Indexed valve area by continuity equation (using LVOT flow): 0.49 cm^2/m^2.    Mean gradient (D): 8 mm  Hg.  ------------------------------------------------------------------- Atrial septum:  No defect or patent foramen ovale was identified.   ------------------------------------------------------------------- Right ventricle:  The cavity size was normal. Wall thickness was normal. Systolic function was normal.  ------------------------------------------------------------------- Pulmonic valve:    Doppler:  There was trivial regurgitation.  ------------------------------------------------------------------- Tricuspid valve:   Doppler:  There was mild regurgitation.  ------------------------------------------------------------------- Right atrium:  The atrium was normal in size.  ------------------------------------------------------------------- Pericardium:  The pericardium was normal in appearance.  ------------------------------------------------------------------- Systemic veins: Inferior vena cava: The vessel was normal in size. The respirophasic diameter changes were in the normal range (= 50%), consistent with normal central venous pressure.  ------------------------------------------------------------------- Post procedure conclusions Ascending Aorta:  - The aorta was normal, not dilated, and non-diseased.  ------------------------------------------------------------------- Measurements   Left ventricle                            Value          Reference  LV ID, ED, PLAX chordal                   43.7  mm       43 - 52  LV ID, ES, PLAX chordal  30.2  mm       23 - 38  LV fx shortening, PLAX chordal            31    %        >=29  LV PW thickness, ED                       9.99  mm       ---------  IVS/LV PW ratio, ED                       0.83           <=1.3  Stroke volume, 2D                         36    ml       ---------  Stroke volume/bsa, 2D                     21    ml/m^2   ---------  LV ejection fraction, 1-p A4C             39     %        ---------  LV end-diastolic volume, 2-p              79    ml       ---------  LV end-diastolic volume/bsa, 2-p          47    ml/m^2   ---------  LV e&', lateral                            6.08  cm/s     ---------  Longitudinal strain, TDI                  13    %        ---------    Ventricular septum                        Value          Reference  IVS thickness, ED                         8.28  mm       ---------    LVOT                                      Value          Reference  LVOT ID, S                                19    mm       ---------  LVOT area                                 2.84  cm^2     ---------  LVOT peak velocity, S                     86.7  cm/s     ---------  LVOT mean velocity, S                     55.3  cm/s     ---------  LVOT VTI, S                               12.8  cm       ---------    Aorta                                     Value          Reference  Aortic root ID, ED                        23    mm       ---------    Left atrium                               Value          Reference  LA ID, A-P, ES                            33    mm       ---------  LA ID/bsa, A-P                            1.96  cm/m^2   <=2.2  LA volume, ES, 1-p A4C                    51.5  ml       ---------  LA volume/bsa, ES, 1-p A4C                30.6  ml/m^2   ---------  LA volume, ES, 1-p A2C                    79.6  ml       ---------  LA volume/bsa, ES, 1-p A2C                47.2  ml/m^2   ---------    Mitral valve                              Value          Reference  Mitral mean velocity, D                   122   cm/s     ---------  Mitral pressure half-time                 68    ms       ---------  Mitral mean gradient, D                   8     mm Hg    ---------  Mitral valve area, PHT, DP                3.24  cm^2     ---------  Mitral valve area/bsa, PHT, DP  1.92  cm^2/m^2 ---------  Mitral valve area, LVOT continuity        0.82  cm^2      ---------  Mitral valve area/bsa, LVOT               0.49  cm^2/m^2 ---------  continuity  Mitral annulus VTI, D                     44.5  cm       ---------    Pulmonary arteries                        Value          Reference  PA pressure, S, DP                 (H)    48    mm Hg    <=30    Tricuspid valve                           Value          Reference  Tricuspid regurg peak velocity            286   cm/s     ---------  Tricuspid peak RV-RA gradient             33    mm Hg    ---------    Systemic veins                            Value          Reference  Estimated CVP                             15    mm Hg    ---------    Right ventricle                           Value          Reference  TAPSE                                     11.8  mm       ---------  RV pressure, S, DP                 (H)    48    mm Hg    <=30  RV s&', lateral, S                         9.24  cm/s     ---------  Legend: (L)  and  (H)  mark values outside specified reference range.  ------------------------------------------------------------------- Prepared and Electronically Authenticated by  Jenkins Rouge, M.D. 2017-06-23T15:39:04   Impression:  Patient is doing well approximately 1 year following mitral valve repair  Plan:  We have not recommended any changes in the patient's current medications. However, I have suggested the patient might consider taking Toprol at bedtime instead of in the morning to see if she tolerates it better. Ultimately it might be reasonable to stop taking a beta blocker altogether, but we'll leave this to the discretion of Dr.  McLean.  The patient has been reminded regarding the importance of dental hygiene and the lifelong need for antibiotic prophylaxis for all dental cleanings and other related invasive procedures.  In the future she will call and return to see [place] only should specific problems or questions arise.  I spent in excess of 15 minutes during the  conduct of this office consultation and >50% of this time involved direct face-to-face encounter with the patient for counseling and/or coordination of their care.    Valentina Gu. Roxy Manns, MD 10/25/2016 10:48 AM

## 2016-10-25 NOTE — Patient Instructions (Addendum)
Continue all previous medications without any changes at this time.  It might be reasonable to take Toprol XL in the evenings instead of the morning and/or stop Toprol XL altogether if it may be making you feel tired.  Endocarditis is a potentially serious infection of heart valves or inside lining of the heart.  It occurs more commonly in patients with diseased heart valves (such as patient's with aortic or mitral valve disease) and in patients who have undergone heart valve repair or replacement.  Certain surgical and dental procedures may put you at risk, such as dental cleaning, other dental procedures, or any surgery involving the respiratory, urinary, gastrointestinal tract, gallbladder or prostate gland.   To minimize your chances for develooping endocarditis, maintain good oral health and seek prompt medical attention for any infections involving the mouth, teeth, gums, skin or urinary tract.    Always notify your doctor or dentist about your underlying heart valve condition before having any invasive procedures. You will need to take antibiotics before certain procedures, including all routine dental cleanings or other dental procedures.  Your cardiologist or dentist should prescribe these antibiotics for you to be taken ahead of time.

## 2016-10-28 ENCOUNTER — Telehealth: Payer: Self-pay | Admitting: Cardiology

## 2016-10-28 NOTE — Telephone Encounter (Signed)
Dr. Meda Coffee and Dr. Aundra Dubin, please review this message.   Refer to Dr. Guy Sandifer note with the pt on 10/25/16, about beta blocker use.  Pt has never seen Dr Meda Coffee and will establish care in October.  Please advise on pts Toprol XL.  Dr. Roxy Manns discussed this in depth with the pt on 5/14 and wanted her Primary Cardiologist to make the call whether she should continue it or d/c it due to intolerance.   Pt is asking what she should do?

## 2016-10-28 NOTE — Telephone Encounter (Signed)
Her EF was mildly reduced at 45% so I had her on beta blocker.  Would ideally stay on some beta blocker, can cut Toprol XL in half and take 12.5 mg daily.

## 2016-10-28 NOTE — Telephone Encounter (Signed)
New Message    She assigned to Dr Meda Coffee after Dr Aundra Dubin , but she is not due to come back in until October ....  Her PCP wants to know if she can stop this or cut the dosage in half  metoprolol succinate (TOPROL-XL) 25 MG 24 hr tablet Take 25 mg by mouth daily before

## 2016-10-28 NOTE — Telephone Encounter (Signed)
I think that it would be inappropriate to comment on this as none of [place] ever met her.  Cub, would you be able to advise this patient of dosage of Toprol ? You saw her on 5/14. [place] Siren

## 2016-10-28 NOTE — Telephone Encounter (Signed)
She is 1 year s/p mitral valve repair and otherwise healthy.  Probably could stop Toprol if she doesn't like it.  She was on beta blocker previously because of palpitations, so they might recur once she stops.  Please review my recent office note.

## 2016-10-29 MED ORDER — METOPROLOL SUCCINATE ER 25 MG PO TB24: ORAL_TABLET | ORAL | 1 refills | Status: DC

## 2016-10-29 NOTE — Telephone Encounter (Signed)
Discussed with patient, pt will try metoprolol succinate 1/2 of a 25 mg tablet at bedtime, pt will let me know if she does not tolerate lower dose at bedtime.

## 2016-11-19 DIAGNOSIS — M216X2 Other acquired deformities of left foot: Secondary | ICD-10-CM | POA: Diagnosis not present

## 2016-11-19 DIAGNOSIS — M2042 Other hammer toe(s) (acquired), left foot: Secondary | ICD-10-CM | POA: Diagnosis not present

## 2016-11-30 ENCOUNTER — Other Ambulatory Visit: Payer: Self-pay | Admitting: Family Medicine

## 2016-11-30 NOTE — Telephone Encounter (Signed)
Denied. Needs an appointment.

## 2016-12-10 ENCOUNTER — Telehealth: Payer: Self-pay | Admitting: Family Medicine

## 2016-12-10 NOTE — Telephone Encounter (Signed)
Nasal cortisone every day   Such as nasacort or flonase   2 sprays    May take 7-10 days to work

## 2016-12-10 NOTE — Telephone Encounter (Signed)
Spoke to the pt and advised her of below instructions.  She will call back if not working.

## 2016-12-10 NOTE — Telephone Encounter (Signed)
Pt would like to know what she can take for her sinus symptoms (pressure behind eyes, ears and drainage in her throat).   Refuse appointment wanted to see what she could take OT has a difficult health history.  Pharm:  [facility].

## 2016-12-10 NOTE — Telephone Encounter (Signed)
Spoke to the pt.  Allergy type sx for 2 weeks.  Drainage down the back of her throat,  Sinus pressure between her eyes, "full" feeling in her ears.  Some headaches (comes and goes).  Runny nose.  No fevers that she is aware of.  Has felt bad at times. Dry cough due to drainage.  Feels run down.  Did use Afrin for a few days.  Didn't do much to help her.  Ok to leave a message on voicemail if she doesn't pick up.  Last seen by [facility] 06/09/16.  Please advise.

## 2016-12-22 DIAGNOSIS — R2689 Other abnormalities of gait and mobility: Secondary | ICD-10-CM | POA: Diagnosis not present

## 2016-12-22 DIAGNOSIS — M25675 Stiffness of left foot, not elsewhere classified: Secondary | ICD-10-CM | POA: Diagnosis not present

## 2017-01-02 ENCOUNTER — Other Ambulatory Visit: Payer: Self-pay | Admitting: Family Medicine

## 2017-01-12 DIAGNOSIS — M25675 Stiffness of left foot, not elsewhere classified: Secondary | ICD-10-CM | POA: Diagnosis not present

## 2017-01-28 DIAGNOSIS — M25675 Stiffness of left foot, not elsewhere classified: Secondary | ICD-10-CM | POA: Diagnosis not present

## 2017-01-31 ENCOUNTER — Ambulatory Visit (INDEPENDENT_AMBULATORY_CARE_PROVIDER_SITE_OTHER): Payer: BLUE CROSS/BLUE SHIELD | Admitting: Family Medicine

## 2017-01-31 ENCOUNTER — Encounter: Payer: Self-pay | Admitting: Family Medicine

## 2017-01-31 VITALS — BP 90/50 | HR 85 | Temp 98.1°F | Ht 69.0 in | Wt 135.9 lb

## 2017-01-31 DIAGNOSIS — H6981 Other specified disorders of Eustachian tube, right ear: Secondary | ICD-10-CM | POA: Diagnosis not present

## 2017-01-31 NOTE — Progress Notes (Signed)
HPI:  Acute visit for R ear pressure: -started: the last week - R ear pressure and decreased hearing -has had a cold for 1-2 weeks with nasal congestion, sore throat, cough, sneezing, ear fullness - now improving -denies:fever, SOB, NVD, tooth pain -has tried: nothing -sick contacts/travel/risks: no reported flu, strep or tick exposure -Hx of: allergies ROS: See pertinent positives and negatives per HPI.  Past Medical History:  Diagnosis Date  . Allergy   . Anxiety   . Atrial tachycardia ([place]) 12/18/2014   24 hour event monitor with frequent PAC's, PVC's and short runs of atrial tachycardia - no sustained arrhythmias or atrial fibrillation   . Chronic kidney disease (CKD)   . Constipation   . H/O multiple myeloma    clinical remission  . Hypertension   . Mitral regurgitation   . Mitral valve prolapse   . Osteoporosis   . Pleural effusion, right 11/03/2015  . S/P minimally invasive mitral valve repair 10/15/2015   Complex valvuloplasty including Alfieri edge-to-edge repair, generous quadrangular resection (shortening) of posterior leaflet, sliding leaflet plasty, Gore-tex neochord placement x6, and 38 mm Edwards Physio II ring annuloplasty via right mini thoracotomy approach     Past Surgical History:  Procedure Laterality Date  . BREAST ENHANCEMENT SURGERY  2000  . CARDIAC CATHETERIZATION N/A 08/18/2015   Procedure: Right/Left Heart Cath and Coronary Angiography;  Surgeon: Larey Dresser, MD;  Location: [facility];  Service: Cardiovascular;  Laterality: N/A;  . [place]  . HAMMERTOE RECONSTRUCTION WITH WEIL OSTEOTOMY Left 09/09/2016   Procedure: Left 2-4 MT Weil osteotomies; Left 2nd hammertoe correction and extensor tendon lengthing;  Surgeon: Wylene Simmer, MD;  Location: [facility];  Service: Orthopedics;  Laterality: Left;  . LIMBAL STEM CELL TRANSPLANT  2008  . MITRAL VALVE REPAIR Right 10/15/2015   Procedure: MINIMALLY INVASIVE MITRAL VALVE  REPAIR (MVR);  Surgeon: Rexene Alberts, MD;  Location: [facility];  Service: Open Heart Surgery;  Laterality: Right;  . OOPHORECTOMY  1991   left  . TEE WITHOUT CARDIOVERSION N/A 09/05/2013   Procedure: TRANSESOPHAGEAL ECHOCARDIOGRAM (TEE);  Surgeon: Larey Dresser, MD;  Location: [facility];  Service: Cardiovascular;  Laterality: N/A;  . TEE WITHOUT CARDIOVERSION N/A 08/18/2015   Procedure: TRANSESOPHAGEAL ECHOCARDIOGRAM (TEE);  Surgeon: Larey Dresser, MD;  Location: [facility];  Service: Cardiovascular;  Laterality: N/A;  . TEE WITHOUT CARDIOVERSION N/A 10/15/2015   Procedure: TRANSESOPHAGEAL ECHOCARDIOGRAM (TEE);  Surgeon: Rexene Alberts, MD;  Location: [facility];  Service: Open Heart Surgery;  Laterality: N/A;  . WISDOM TOOTH EXTRACTION  1975    Family History  Problem Relation Age of Onset  . Non-Hodgkin's lymphoma Mother   . Cancer Mother   . Congestive Heart Failure Mother   . Rheumatic fever Mother        with mitral valve replacment  . Hypertension Father   . Diabetes Unknown        family hx  . Hypertension Unknown        family hx  . Lymphoma Unknown        family hx  . Colon cancer Neg Hx   . Stomach cancer Neg Hx     Social History   Social History  . Marital status: Married    Spouse name: N/A  . Number of children: N/A  . Years of education: N/A   Social History Main Topics  . Smoking status: Never Smoker  . Smokeless tobacco: Never  Used  . Alcohol use 1.2 oz/week    2 Glasses of wine per week     Comment: socially  . Drug use: No  . Sexual activity: Not Asked   Other Topics Concern  . None   Social History Narrative  . None     Current Outpatient Prescriptions:  .  acyclovir (ZOVIRAX) 200 MG capsule, Take 200 mg by mouth daily as needed (BREAKOUTS). Reported on 12/05/2015, Disp: , Rfl:  .  aspirin 81 MG tablet, Take 81 mg by mouth daily. Reported on 10/08/2015, Disp: , Rfl:  .  calcium carbonate (OS-CAL) 600 MG TABS, Take 600 mg by mouth daily with  breakfast. , Disp: , Rfl:  .  cetirizine (ZYRTEC) 10 MG tablet, Take 10 mg by mouth daily., Disp: , Rfl:  .  Estrogens, Conjugated (PREMARIN VA), Place 1 application vaginally daily as needed (dryness). , Disp: , Rfl:  .  lisinopril (PRINIVIL,ZESTRIL) 5 MG tablet, TAKE 1 TABLET BY MOUTH EVERY MORNING, Disp: 90 tablet, Rfl: 0 .  metoprolol succinate (TOPROL XL) 25 MG 24 hr tablet, 1/2 tablet (12.5 mg) at bedtime, Disp: 45 tablet, Rfl: 1 .  Multiple Vitamins-Minerals (MULTIVITAL PO), Take 1 tablet by mouth daily.  , Disp: , Rfl:   EXAM:  Vitals:   01/31/17 1359  BP: (!) 90/50  Pulse: 85  Temp: 98.1 F (36.7 C)    Body mass index is 20.07 kg/m.  GENERAL: vitals reviewed and listed above, alert, oriented, appears well hydrated and in no acute distress  HEENT: atraumatic, conjunttiva clear, no obvious abnormalities on inspection of external nose and ears, normal appearance of ear canals and TM except for mild effusion R and mild retraction TM, clear nasal congestion, mild post oropharyngeal erythema with PND, no tonsillar edema or exudate, no sinus TTP  NECK: no obvious masses on inspection  LUNGS: clear to auscultation bilaterally, no wheezes, rales or rhonchi, good air movement  CV: HRRR, no peripheral edema  MS: moves all extremities without noticeable abnormality  PSYCH: pleasant and cooperative, no obvious depression or anxiety  ASSESSMENT AND PLAN:  Discussed the following assessment and plan:  Dysfunction of right eustachian tube  -given HPI and exam findings today, a serious infection or illness is unlikely. We discussed potential etiologies, with VURI resolving and ETD being most likely, and advised OTC short course nasal decongestant and INS. We discussed treatment side effects, likely course, antibiotic misuse, transmission, and signs of developing a serious illness. -of course, we advised to return or notify a doctor immediately if symptoms worsen or persist or new  concerns arise.    Patient Instructions  BEFORE YOU LEAVE: -follow up: as needed  Afrin nasal spray per instructions for 3 days. Do not use longer.  Flonase 2 sprays each nostril daily for 3 weeks.  I hope you are feeling better soon! Follow up if worsening, new concerns or you are not improving with treatment.      Colin Benton R., DO

## 2017-01-31 NOTE — Patient Instructions (Signed)
BEFORE YOU LEAVE: -follow up: as needed  Afrin nasal spray per instructions for 3 days. Do not use longer.  Flonase 2 sprays each nostril daily for 3 weeks.  I hope you are feeling better soon! Follow up if worsening, new concerns or you are not improving with treatment.

## 2017-02-02 DIAGNOSIS — M25675 Stiffness of left foot, not elsewhere classified: Secondary | ICD-10-CM | POA: Diagnosis not present

## 2017-02-04 DIAGNOSIS — M25675 Stiffness of left foot, not elsewhere classified: Secondary | ICD-10-CM | POA: Diagnosis not present

## 2017-02-07 DIAGNOSIS — M25675 Stiffness of left foot, not elsewhere classified: Secondary | ICD-10-CM | POA: Diagnosis not present

## 2017-02-10 DIAGNOSIS — M25675 Stiffness of left foot, not elsewhere classified: Secondary | ICD-10-CM | POA: Diagnosis not present

## 2017-02-16 DIAGNOSIS — M25675 Stiffness of left foot, not elsewhere classified: Secondary | ICD-10-CM | POA: Diagnosis not present

## 2017-02-21 DIAGNOSIS — M25675 Stiffness of left foot, not elsewhere classified: Secondary | ICD-10-CM | POA: Diagnosis not present

## 2017-03-04 ENCOUNTER — Encounter: Payer: Self-pay | Admitting: Family Medicine

## 2017-03-30 ENCOUNTER — Other Ambulatory Visit: Payer: Self-pay

## 2017-03-30 ENCOUNTER — Ambulatory Visit (HOSPITAL_COMMUNITY): Payer: BLUE CROSS/BLUE SHIELD | Attending: Cardiovascular Disease

## 2017-03-30 DIAGNOSIS — N189 Chronic kidney disease, unspecified: Secondary | ICD-10-CM | POA: Insufficient documentation

## 2017-03-30 DIAGNOSIS — Z9889 Other specified postprocedural states: Secondary | ICD-10-CM | POA: Diagnosis not present

## 2017-03-30 DIAGNOSIS — I081 Rheumatic disorders of both mitral and tricuspid valves: Secondary | ICD-10-CM | POA: Insufficient documentation

## 2017-03-30 DIAGNOSIS — C9000 Multiple myeloma not having achieved remission: Secondary | ICD-10-CM | POA: Diagnosis not present

## 2017-03-30 DIAGNOSIS — I371 Nonrheumatic pulmonary valve insufficiency: Secondary | ICD-10-CM | POA: Diagnosis not present

## 2017-03-30 DIAGNOSIS — I129 Hypertensive chronic kidney disease with stage 1 through stage 4 chronic kidney disease, or unspecified chronic kidney disease: Secondary | ICD-10-CM | POA: Insufficient documentation

## 2017-03-30 DIAGNOSIS — I340 Nonrheumatic mitral (valve) insufficiency: Secondary | ICD-10-CM | POA: Diagnosis not present

## 2017-04-06 ENCOUNTER — Other Ambulatory Visit: Payer: Self-pay | Admitting: Family Medicine

## 2017-04-13 ENCOUNTER — Ambulatory Visit (INDEPENDENT_AMBULATORY_CARE_PROVIDER_SITE_OTHER): Payer: BLUE CROSS/BLUE SHIELD | Admitting: Cardiology

## 2017-04-13 ENCOUNTER — Encounter: Payer: Self-pay | Admitting: Cardiology

## 2017-04-13 VITALS — BP 104/58 | HR 73 | Ht 69.0 in | Wt 134.0 lb

## 2017-04-13 DIAGNOSIS — I420 Dilated cardiomyopathy: Secondary | ICD-10-CM

## 2017-04-13 DIAGNOSIS — I4719 Other supraventricular tachycardia: Secondary | ICD-10-CM

## 2017-04-13 DIAGNOSIS — I471 Supraventricular tachycardia: Secondary | ICD-10-CM

## 2017-04-13 DIAGNOSIS — Z9889 Other specified postprocedural states: Secondary | ICD-10-CM | POA: Diagnosis not present

## 2017-04-13 DIAGNOSIS — I340 Nonrheumatic mitral (valve) insufficiency: Secondary | ICD-10-CM

## 2017-04-13 DIAGNOSIS — Z23 Encounter for immunization: Secondary | ICD-10-CM | POA: Diagnosis not present

## 2017-04-13 NOTE — Patient Instructions (Signed)
Medication Instructions:  Your physician recommends that you continue on your current medications as directed. Please refer to the Current Medication list given to you today.  Labwork: None ordered   Testing/Procedures: Your physician has requested that you have an echocardiogram. Echocardiography is a painless test that uses sound waves to create images of your heart. It provides your doctor with information about the size and shape of your heart and how well your heart's chambers and valves are working. This procedure takes approximately one hour. There are no restrictions for this procedure.  Follow-Up: Your physician wants you to follow-up in: 1 year with Dr. Meda Coffee. You will receive a reminder letter in the mail two months in advance. If you don't receive a letter, please call our office to schedule the follow-up appointment.  Any Other Special Instructions Will Be Listed Below (If Applicable).     If you need a refill on your cardiac medications before your next appointment, please call your pharmacy.

## 2017-04-13 NOTE — Progress Notes (Addendum)
Cardiology Office Note:    Date:  04/13/2017   ID:  Cristina Garcia, DOB 1960/12/11, MRN [id number]  PCP:  Dorena Cookey, MD  Cardiologist: Dr Aundra Dubin --> Ena Dawley, MD   Referring MD: Dorena Cookey, MD   Chief complain: 1 year follow up after MV repair  History of Present Illness:    Cristina Garcia is a 56 y.o. female with a hx of mitral valve prolapse and mitral regurgitation presents for cardiology evaluation.  Patient has had a long history of murmur and has been told for years that she has mitral valve prolapse.  No history of this in her family though her mother had rheumatic fever and a mitral valve operation.  Her PCP sent her for an echo in 1/15, which showed bileaflet prolapse and probably moderate MR.  TEE was recommended.  This was done in 3/15 and showed moderate to severe MR from bileaflet mitral valve prolapse with normal LV size and no pulmonary vein systolic doppler flow reversal.  She had repeat echo in 6/16.  It continued to show moderate to severe mid to late systolic mitral regurgitation in the setting of MVP.  EF 60%, mild LV dilation, moderate LAE.  Echo in 2/17 showed severe MR due to MVP.    Last winter, she began to develop exertional dyspnea.  I was concerned that this came from her mitral valve.  TEE confirmed severe MR from bileaflet prolapse. LHC showed no significant CAD.  In 5/17, she had a complex minimally invasive mitral valve repair by Dr Roxy Manns.  She had Alfieri edge-to-edge repair and annuloplasty.  Post-operatively, she required a right thoracentesis.  Echo in 6/17 (post-op) showed EF 45%, mild LV dilation, stable repaired mitral valve with no MR or MS.  PASP 48 mmHg.   04/13/2017, this is the first time patient is with me, she has been followed by Dr. Aundra Dubin.  She states that since surgery she has been feeling better, she is able to exercise however currently limited with foot pain that is hereditary and post surgery earlier this year.  She states  that the major improvement post surgery is resolution of palpitations.  She has been tolerating the decreased dose of Toprol, it still makes her tired but significantly better than 25 mg daily.  She denies any lower extremity edema orthopnea proximal nocturnal dyspnea.  No dizziness or falls.  No recent fever or chills.  Past Medical History:  Diagnosis Date  . Allergy   . Anxiety   . Atrial tachycardia ([place]) 12/18/2014   24 hour event monitor with frequent PAC's, PVC's and short runs of atrial tachycardia - no sustained arrhythmias or atrial fibrillation   . Chronic kidney disease (CKD)   . Constipation   . H/O multiple myeloma    clinical remission  . Hypertension   . Mitral regurgitation   . Mitral valve prolapse   . Osteoporosis   . Pleural effusion, right 11/03/2015  . S/P minimally invasive mitral valve repair 10/15/2015   Complex valvuloplasty including Alfieri edge-to-edge repair, generous quadrangular resection (shortening) of posterior leaflet, sliding leaflet plasty, Gore-tex neochord placement x6, and 38 mm Edwards Physio II ring annuloplasty via right mini thoracotomy approach     Past Surgical History:  Procedure Laterality Date  . BREAST ENHANCEMENT SURGERY  2000  . CARDIAC CATHETERIZATION N/A 08/18/2015   Procedure: Right/Left Heart Cath and Coronary Angiography;  Surgeon: Larey Dresser, MD;  Location: [place] [facility];  Service: Cardiovascular;  Laterality: N/A;  . [place]  . HAMMERTOE RECONSTRUCTION WITH WEIL OSTEOTOMY Left 09/09/2016   Procedure: Left 2-4 MT Weil osteotomies; Left 2nd hammertoe correction and extensor tendon lengthing;  Surgeon: Wylene Simmer, MD;  Location: [facility];  Service: Orthopedics;  Laterality: Left;  . LIMBAL STEM CELL TRANSPLANT  2008  . MITRAL VALVE REPAIR Right 10/15/2015   Procedure: MINIMALLY INVASIVE MITRAL VALVE REPAIR (MVR);  Surgeon: Rexene Alberts, MD;  Location: [facility];  Service: Open Heart Surgery;   Laterality: Right;  . OOPHORECTOMY  1991   left  . TEE WITHOUT CARDIOVERSION N/A 09/05/2013   Procedure: TRANSESOPHAGEAL ECHOCARDIOGRAM (TEE);  Surgeon: Larey Dresser, MD;  Location: [place];  Service: Cardiovascular;  Laterality: N/A;  . TEE WITHOUT CARDIOVERSION N/A 08/18/2015   Procedure: TRANSESOPHAGEAL ECHOCARDIOGRAM (TEE);  Surgeon: Larey Dresser, MD;  Location: [facility];  Service: Cardiovascular;  Laterality: N/A;  . TEE WITHOUT CARDIOVERSION N/A 10/15/2015   Procedure: TRANSESOPHAGEAL ECHOCARDIOGRAM (TEE);  Surgeon: Rexene Alberts, MD;  Location: [place];  Service: Open Heart Surgery;  Laterality: N/A;  . WISDOM TOOTH EXTRACTION  1975    Current Medications: Current Meds  Medication Sig  . acyclovir (ZOVIRAX) 200 MG capsule Take 200 mg by mouth daily as needed (BREAKOUTS). Reported on 12/05/2015  . aspirin 81 MG tablet Take 81 mg by mouth daily. Reported on 10/08/2015  . calcium carbonate (OS-CAL) 600 MG TABS Take 600 mg by mouth daily with breakfast.   . cetirizine (ZYRTEC) 10 MG tablet Take 10 mg by mouth daily.  . Estrogens, Conjugated (PREMARIN VA) Place 1 application vaginally daily as needed (dryness).   Marland Kitchen lisinopril (PRINIVIL,ZESTRIL) 5 MG tablet TAKE 1 TABLET BY MOUTH EVERY MORNING  . metoprolol succinate (TOPROL XL) 25 MG 24 hr tablet 1/2 tablet (12.5 mg) at bedtime  . Multiple Vitamins-Minerals (MULTIVITAL PO) Take 1 tablet by mouth daily.       Allergies:   Patient has no known allergies.   Social History   Social History  . Marital status: Married    Spouse name: N/A  . Number of children: N/A  . Years of education: N/A   Social History Main Topics  . Smoking status: Never Smoker  . Smokeless tobacco: Never Used  . Alcohol use 1.2 oz/week    2 Glasses of wine per week     Comment: socially  . Drug use: No  . Sexual activity: Not Asked   Other Topics Concern  . None   Social History Narrative  . None     Family History: The patient's family  history includes Cancer in her mother; Congestive Heart Failure in her mother; Diabetes in her unknown relative; Hypertension in her father and unknown relative; Lymphoma in her unknown relative; Non-Hodgkin's lymphoma in her mother; Rheumatic fever in her mother. There is no history of Colon cancer or Stomach cancer. ROS:   Please see the history of present illness.     All other systems reviewed and are negative.  EKGs/Labs/Other Studies Reviewed:    The following studies were reviewed today:  EKG:  EKG is ordered today.  The ekg ordered today demonstrates normal sinus rhythm otherwise normal EKG unchanged from prior.  Recent Labs: 10/12/2016: BUN 23.9; Creatinine 1.2; HGB 15.7; Platelets 161; Potassium 3.9; Sodium 138  Recent Lipid Panel    Component Value Date/Time   CHOL 245 (H) 06/18/2014 0856   TRIG 69.0 06/18/2014 0856   HDL 90.40 06/18/2014 0856  CHOLHDL 3 06/18/2014 0856   VLDL 13.8 06/18/2014 0856   LDLCALC 141 (H) 06/18/2014 0856   LDLDIRECT 104.4 08/13/2010 0835    Physical Exam:    VS:  BP (!) 104/58   Pulse 73   Ht '5\' 9"'  (1.753 m)   Wt 134 lb (60.8 kg)   SpO2 98%   BMI 19.79 kg/m     Wt Readings from Last 3 Encounters:  04/13/17 134 lb (60.8 kg)  01/31/17 135 lb 14.4 oz (61.6 kg)  10/25/16 132 lb (59.9 kg)    GEN:  Well nourished, well developed in no acute distress HEENT: Normal NECK: No JVD; No carotid bruits LYMPHATICS: No lymphadenopathy CARDIAC: RRR, 2/6 murmurs, rubs, gallops RESPIRATORY:  Clear to auscultation without rales, wheezing or rhonchi  ABDOMEN: Soft, non-tender, non-distended MUSCULOSKELETAL:  No edema; No deformity  SKIN: Warm and dry NEUROLOGIC:  Alert and oriented x 3 PSYCHIATRIC:  Normal affect  TTE: 03/30/2017 - Left ventricle: The cavity size was normal. Systolic function was   mildly reduced. The estimated ejection fraction was in the range   of 45% to 50%. Mild hypokinesis of the anteroseptal and   inferoseptal  myocardium. - Ventricular septum: Septal motion showed &quot;bounce&quot;. - Aortic valve: Transvalvular velocity was within the normal range.   There was no stenosis. There was no regurgitation. - Mitral valve: Prior procedures included surgical repair. An   annular ring prosthesis was present. There was mild   regurgitation. Mitral valve inflow gradient is moderately   elevated. Pressure half-time: 147 ms. Mean gradient (D): 8 mm Hg.   Valve area by pressure half-time: 1.5 cm^2. Valve area by   continuity equation (using LVOT flow): 0.71 cm^2. - Left atrium: The atrium was mildly to moderately dilated. - Right ventricle: The cavity size was normal. Wall thickness was   normal. - Tricuspid valve: There was mild regurgitation. - Pulmonary arteries: Systolic pressure was within the normal   range. PA peak pressure: 32 mm Hg (S).  Impressions:  - Compared with the echo 11/2015, the mitral valve pressure   half-time has increased from 68 ms to 147 ms, which correlates   with a decrease in the calculated mitral valve area from 3.24   cm^2 to 1.5 cm^2. However, the mean gradient is unchanged at 8   mmHg. Overall valve function is stable. The previously noted   doming of the anterior mitral valve leaflet is still present but   less prominent. The left atrium is now much smaller.     ASSESSMENT:    1. Mitral valve insufficiency, unspecified etiology   2. Need for immunization against influenza   3. S/P mitral valve repair   4. Atrial tachycardia ([place])    PLAN:    In order of problems listed above:  1. Mitral regurgitation: Now s/p mitral valve repair.  Echo from 03/30/17 shows mild mitral regurgitation, and transmitral gradient 8 mmHg  That is table from prior, she is asymptomatic. We will follow with yearly echos unless change in symptoms. 2.  Cardiomyopathy with LVEF 45% post surgery, this is most probably valvular in origin and was artificially higher prior to the surgery, we  will continue lisinopril 5 mg daily and very low-dose of Toprol XL 12.5 mg daily.  No room to increase. Continue ASA 81 daily post-MV repair.  She will need antibiotic prophylaxis with dental work.  Encouraged to continue exercising. 3.  We will provide flu shot today. 4.  Atrial tachycardia has resolved per surgery.  Medication Adjustments/Labs and Tests Ordered: Current medicines are reviewed at length with the patient today.  Concerns regarding medicines are outlined above.  Orders Placed This Encounter  Procedures  . Flu Vaccine QUAD 36+ mos IM  . EKG 12-Lead  . ECHOCARDIOGRAM COMPLETE   No orders of the defined types were placed in this encounter.   Signed, Ena Dawley, MD  04/13/2017 1:46 PM    [place]

## 2017-04-14 ENCOUNTER — Other Ambulatory Visit (HOSPITAL_BASED_OUTPATIENT_CLINIC_OR_DEPARTMENT_OTHER): Payer: BLUE CROSS/BLUE SHIELD

## 2017-04-14 ENCOUNTER — Ambulatory Visit (HOSPITAL_BASED_OUTPATIENT_CLINIC_OR_DEPARTMENT_OTHER): Payer: BLUE CROSS/BLUE SHIELD | Admitting: Oncology

## 2017-04-14 ENCOUNTER — Telehealth: Payer: Self-pay

## 2017-04-14 VITALS — BP 101/66 | HR 77 | Temp 97.5°F | Resp 18 | Ht 69.0 in | Wt 134.6 lb

## 2017-04-14 DIAGNOSIS — Z9484 Stem cells transplant status: Secondary | ICD-10-CM | POA: Diagnosis not present

## 2017-04-14 DIAGNOSIS — C9001 Multiple myeloma in remission: Secondary | ICD-10-CM

## 2017-04-14 LAB — CBC WITH DIFFERENTIAL/PLATELET
BASO%: 0.4 % (ref 0.0–2.0)
BASOS ABS: 0 10*3/uL (ref 0.0–0.1)
EOS ABS: 0.1 10*3/uL (ref 0.0–0.5)
EOS%: 1.9 % (ref 0.0–7.0)
HEMATOCRIT: 45.5 % (ref 34.8–46.6)
HEMOGLOBIN: 15.5 g/dL (ref 11.6–15.9)
LYMPH#: 1.2 10*3/uL (ref 0.9–3.3)
LYMPH%: 26.3 % (ref 14.0–49.7)
MCH: 32.8 pg (ref 25.1–34.0)
MCHC: 34.1 g/dL (ref 31.5–36.0)
MCV: 96.2 fL (ref 79.5–101.0)
MONO#: 0.6 10*3/uL (ref 0.1–0.9)
MONO%: 11.9 % (ref 0.0–14.0)
NEUT#: 2.8 10*3/uL (ref 1.5–6.5)
NEUT%: 59.5 % (ref 38.4–76.8)
PLATELETS: 167 10*3/uL (ref 145–400)
RBC: 4.73 10*6/uL (ref 3.70–5.45)
RDW: 12.6 % (ref 11.2–14.5)
WBC: 4.7 10*3/uL (ref 3.9–10.3)

## 2017-04-14 LAB — BASIC METABOLIC PANEL
ANION GAP: 10 meq/L (ref 3–11)
BUN: 23.2 mg/dL (ref 7.0–26.0)
CALCIUM: 9.9 mg/dL (ref 8.4–10.4)
CO2: 27 meq/L (ref 22–29)
Chloride: 102 mEq/L (ref 98–109)
Creatinine: 1.2 mg/dL — ABNORMAL HIGH (ref 0.6–1.1)
EGFR: 48 mL/min/{1.73_m2} — ABNORMAL LOW (ref >60)
GLUCOSE: 79 mg/dL (ref 70–140)
Potassium: 4.6 mEq/L (ref 3.5–5.1)
Sodium: 139 mEq/L (ref 136–145)

## 2017-04-14 NOTE — Telephone Encounter (Signed)
Printed avs and calender for upcoming appointment. Gave contact information to radiology to schedule appointment. Per 11/1 los

## 2017-04-14 NOTE — Progress Notes (Signed)
  [facility] OFFICE PROGRESS NOTE   Diagnosis: Myeloma myeloma  INTERVAL HISTORY:   Cristina Garcia returns as scheduled.  She feels well.  She reports foul-smelling urine, but has no other symptoms of dysuria. She has "flashing lights "proceeding a mild headache.  This has occurred more over the past month.  She is considering seeing a neurologist.  The headache does not require pain medication.  Objective:  Vital signs in last 24 hours:  Blood pressure 101/66, pulse 77, temperature (!) 97.5 F (36.4 C), temperature source Oral, resp. rate 18, height _0  (1.753 m), weight 134 lb 9.6 oz (61.1 kg), SpO2 100 %.    Resp: Lungs clear bilaterally Cardio: Regular rate and rhythm GI: No hepatosplenomegaly Vascular: No leg edema    Portacath/PICC-without erythema  Lab Results:  Lab Results  Component Value Date   WBC 4.7 04/14/2017   HGB 15.5 04/14/2017   HCT 45.5 04/14/2017   MCV 96.2 04/14/2017   PLT 167 04/14/2017   NEUTROABS 2.8 04/14/2017    CMP     Component Value Date/Time   NA 139 04/14/2017 1532   K 4.6 04/14/2017 1532   CL 99 12/19/2015 1620   CL 102 07/25/2012 1532   CO2 27 04/14/2017 1532   GLUCOSE 79 04/14/2017 1532   GLUCOSE 70 07/25/2012 1532   BUN 23.2 04/14/2017 1532   CREATININE 1.2 (H) 04/14/2017 1532   CALCIUM 9.9 04/14/2017 1532   PROT 6.4 10/12/2016 1517   PROT 6.7 07/30/2014 0813   ALBUMIN 4.3 10/10/2015 1344   ALBUMIN 4.1 07/30/2014 0813   AST 25 10/10/2015 1344   AST 23 07/30/2014 0813   ALT 19 10/10/2015 1344   ALT 18 07/30/2014 0813   ALKPHOS 49 10/10/2015 1344   ALKPHOS 58 07/30/2014 0813   BILITOT 0.9 10/10/2015 1344   BILITOT 1.00 07/30/2014 0813   GFRNONAA 53 (L) 10/21/2015 0315   GFRAA >60 10/21/2015 0315    Medications: I have reviewed the patient's current medications.  Assessment/Plan: 1. Multiple myeloma, IgA kappa, status post high-dose chemotherapy with autologous stem cell support at [facility] in June 2008. She  remains in clinical remission. 2. history of Renal insufficiency secondary to multiple myeloma.  3. History of hypokalemia. 4. History of left pelvic pain secondary to a lytic left iliac lesion. A metastatic bone survey was unchanged 04/05/2016 5. Simple cyst at the lower pole of the right kidney on a renal ultrasound, 11/08/2006. 6. Multiseptated cyst of the left adnexa on an ultrasound, 11/08/2006. 7. Osteopenia 8. Mitral valve prolapse/mitral regurgitation-minimally invasive mitral valve repair 10/15/2015 9. Report of intermittent rectal bleeding. Normal colonoscopy by Dr. Fuller Plan on 08/20/2011.    Disposition:  Cristina Garcia appears unchanged.  There is no clinical or laboratory evidence for progression of the myeloma.  We will follow-up on the serum light chains and serum protein electrophoresis from today. She will be referred for a surveillance bone survey today.  Cristina Garcia will return for an office and lab visit in 6 months. She will seek medical attention if the headaches persist.  15 minutes were spent with the patient today.  The majority of the time was used for counseling and coordination of care.  Donneta Romberg, MD  04/14/2017  4:49 PM

## 2017-04-15 LAB — KAPPA/LAMBDA LIGHT CHAINS
Ig Kappa Free Light Chain: 17 mg/L (ref 3.3–19.4)
Ig Lambda Free Light Chain: 18 mg/L (ref 5.7–26.3)
Kappa/Lambda FluidC Ratio: 0.94 (ref 0.26–1.65)

## 2017-04-18 LAB — PROTEIN ELECTROPHORESIS, SERUM, WITH REFLEX
A/G RATIO SPE: 1.4 (ref 0.7–1.7)
ALPHA 1: 0.2 g/dL (ref 0.0–0.4)
ALPHA 2: 0.7 g/dL (ref 0.4–1.0)
Albumin: 3.9 g/dL (ref 2.9–4.4)
Beta: 0.9 g/dL (ref 0.7–1.3)
Gamma Globulin: 0.9 g/dL (ref 0.4–1.8)
Globulin, Total: 2.7 g/dL (ref 2.2–3.9)
TOTAL PROTEIN: 6.6 g/dL (ref 6.0–8.5)

## 2017-04-20 ENCOUNTER — Ambulatory Visit (HOSPITAL_COMMUNITY)
Admission: RE | Admit: 2017-04-20 | Discharge: 2017-04-20 | Disposition: A | Discharge status: Home / Self Care | Payer: BLUE CROSS/BLUE SHIELD | Source: Ambulatory Visit | Attending: Oncology | Admitting: Oncology

## 2017-04-20 DIAGNOSIS — R937 Abnormal findings on diagnostic imaging of other parts of musculoskeletal system: Secondary | ICD-10-CM | POA: Diagnosis not present

## 2017-04-20 DIAGNOSIS — C9000 Multiple myeloma not having achieved remission: Secondary | ICD-10-CM | POA: Diagnosis not present

## 2017-04-20 DIAGNOSIS — C9001 Multiple myeloma in remission: Secondary | ICD-10-CM | POA: Diagnosis not present

## 2017-04-21 ENCOUNTER — Telehealth: Payer: Self-pay | Admitting: *Deleted

## 2017-04-21 NOTE — Telephone Encounter (Signed)
-----   Message from Ladell Pier, MD sent at 04/21/2017  7:20 AM EST ----- Please call patient, bone survey stable except possible tiny lesion in left sacrum, no lab evidence of progressive myeloma, f/u as scheduled

## 2017-04-21 NOTE — Telephone Encounter (Signed)
Called pt with bone survey result, per MD note below. Pt voiced understanding.

## 2017-05-19 ENCOUNTER — Other Ambulatory Visit: Payer: Self-pay | Admitting: Cardiology

## 2017-05-20 IMAGING — CR DG CHEST 2V
2 series · 2 of 2 positions shown · non-contrast
Comparison: 07/31/2013 chest radiograph.

CLINICAL DATA: Preoperative for mitral valve surgery.

EXAM:
CHEST  2 VIEW

[w chest pa]
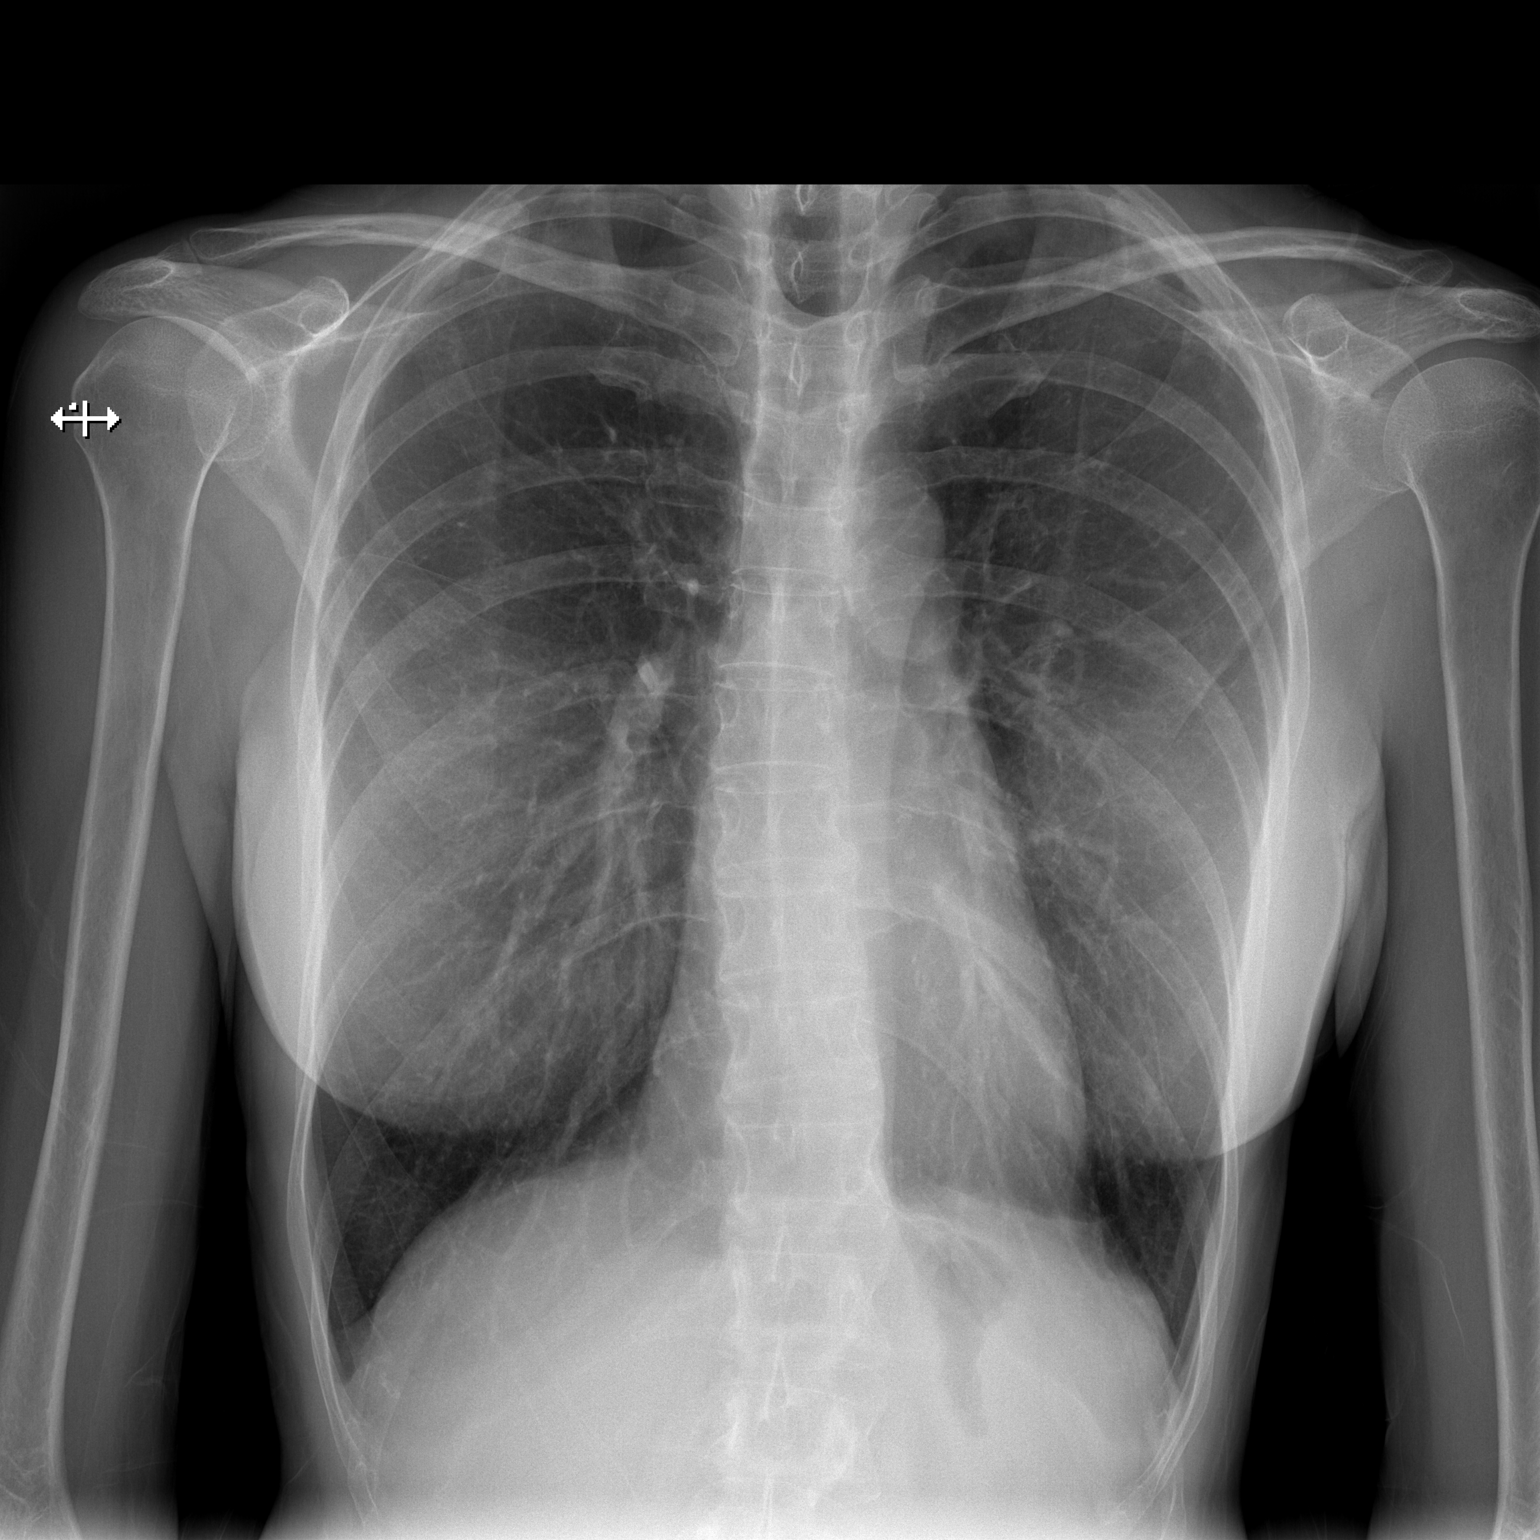

[w chest lat]
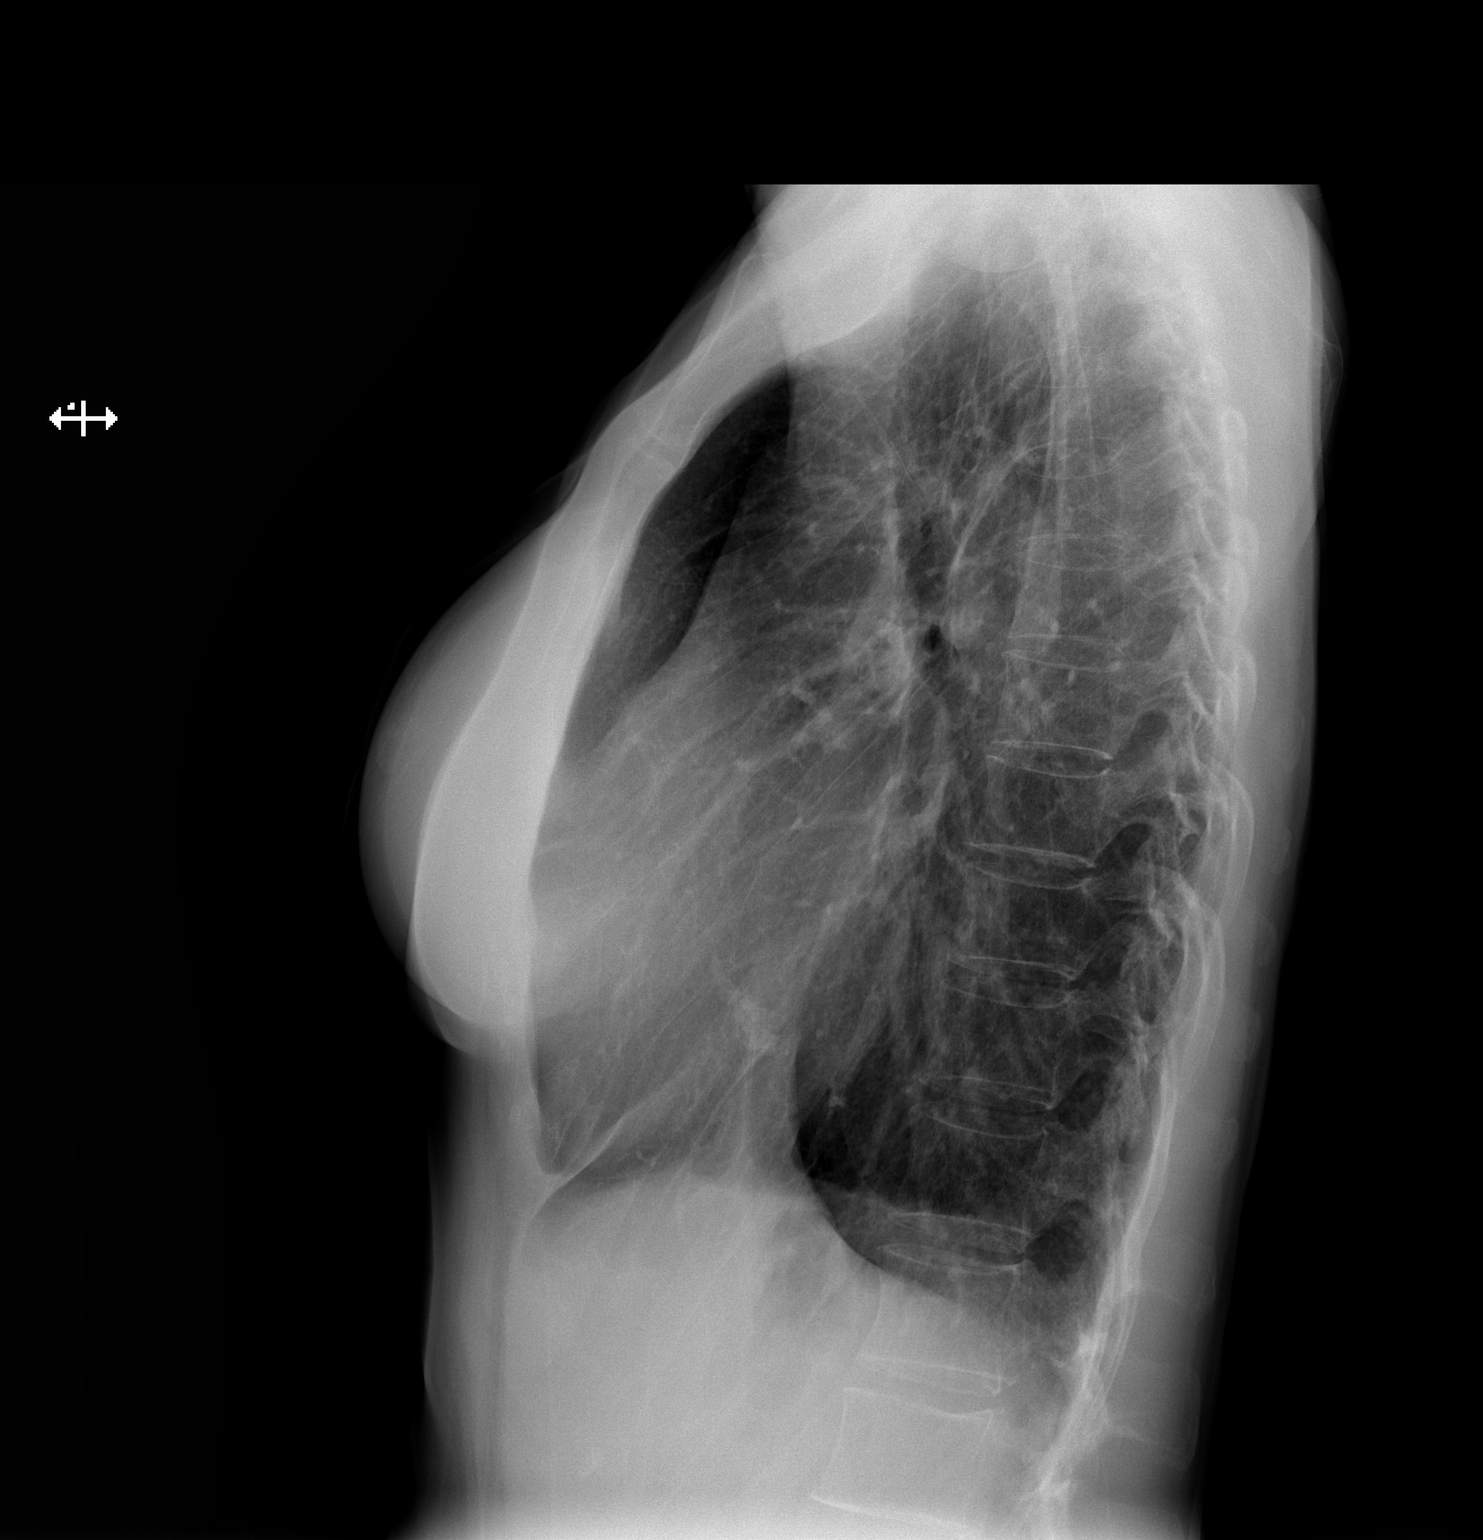

[2 of 2 positions shown; findings below may reference images not displayed]

FINDINGS: Stable cardiomediastinal silhouette with normal heart size and
mitral annular calcification. No pneumothorax. No pleural effusion.
Lungs appear clear, with no acute consolidative airspace disease and
no pulmonary edema.
IMPRESSION: No active cardiopulmonary disease.

## 2017-05-27 IMAGING — CR DG CHEST 1V PORT
1 series · 1 of 1 positions shown · non-contrast
Comparison: 10/16/2015.

CLINICAL DATA: Atelectasis.

EXAM:
PORTABLE CHEST 1 VIEW

[AP]
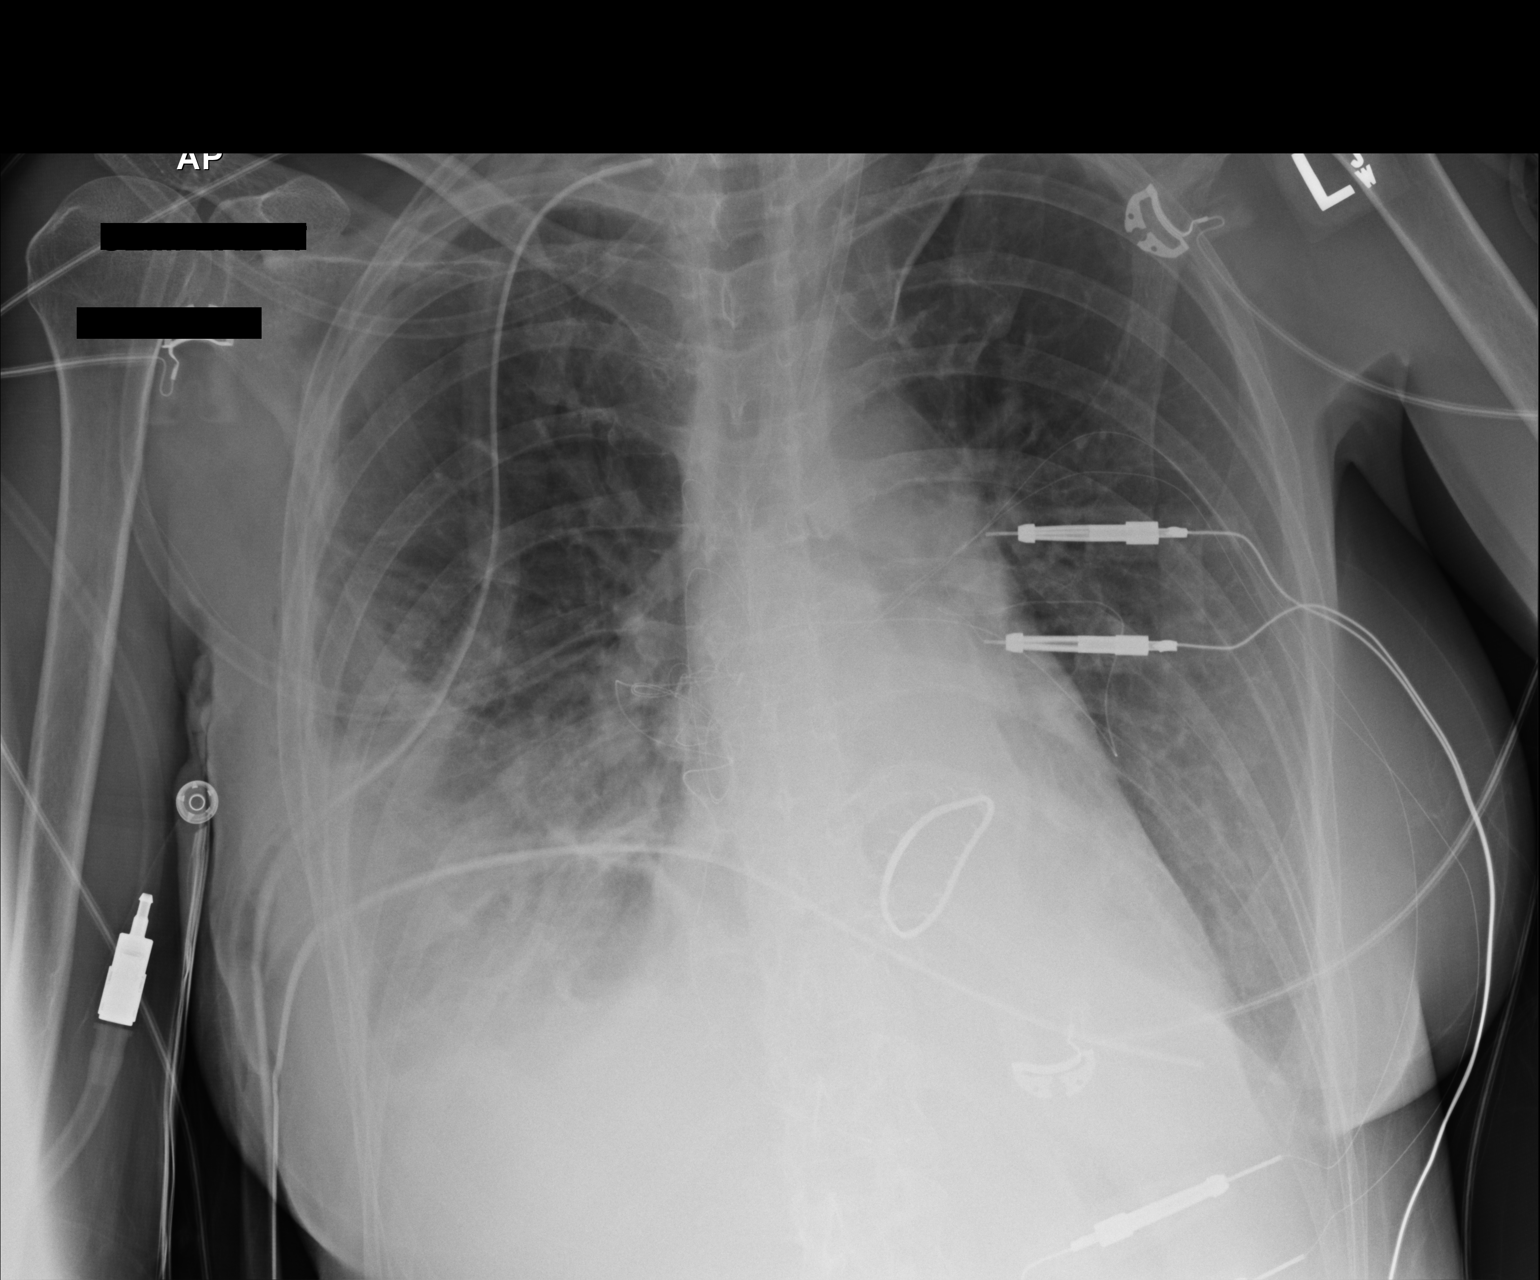

[1 of 1 positions shown; findings below may reference images not displayed]

FINDINGS: Interval removal of Swan-Ganz catheter. Right chest tubes in stable
position. Prior cardiac valve repair. Stable mild cardiomegaly.
Stable bibasilar pulmonary infiltrates and or edema. Stable small
pleural effusions. Minimal chest wall subcutaneous emphysema.
IMPRESSION: 1. Interim removal Swan-Ganz catheter. Right chest tubes in stable
position. No pneumothorax.
2. Prior cardiac valve repair.  Stable cardiomegaly.
3. Stable bibasilar infiltrates/ edema and small pleural effusions.

## 2017-05-31 IMAGING — CR DG CHEST 2V
2 series · 2 of 2 positions shown · non-contrast
Comparison: 10/20/2015.

CLINICAL DATA: Mitral valve repair. Atelectasis. Postoperative
soreness.

EXAM:
CHEST  2 VIEW

[chest pa]
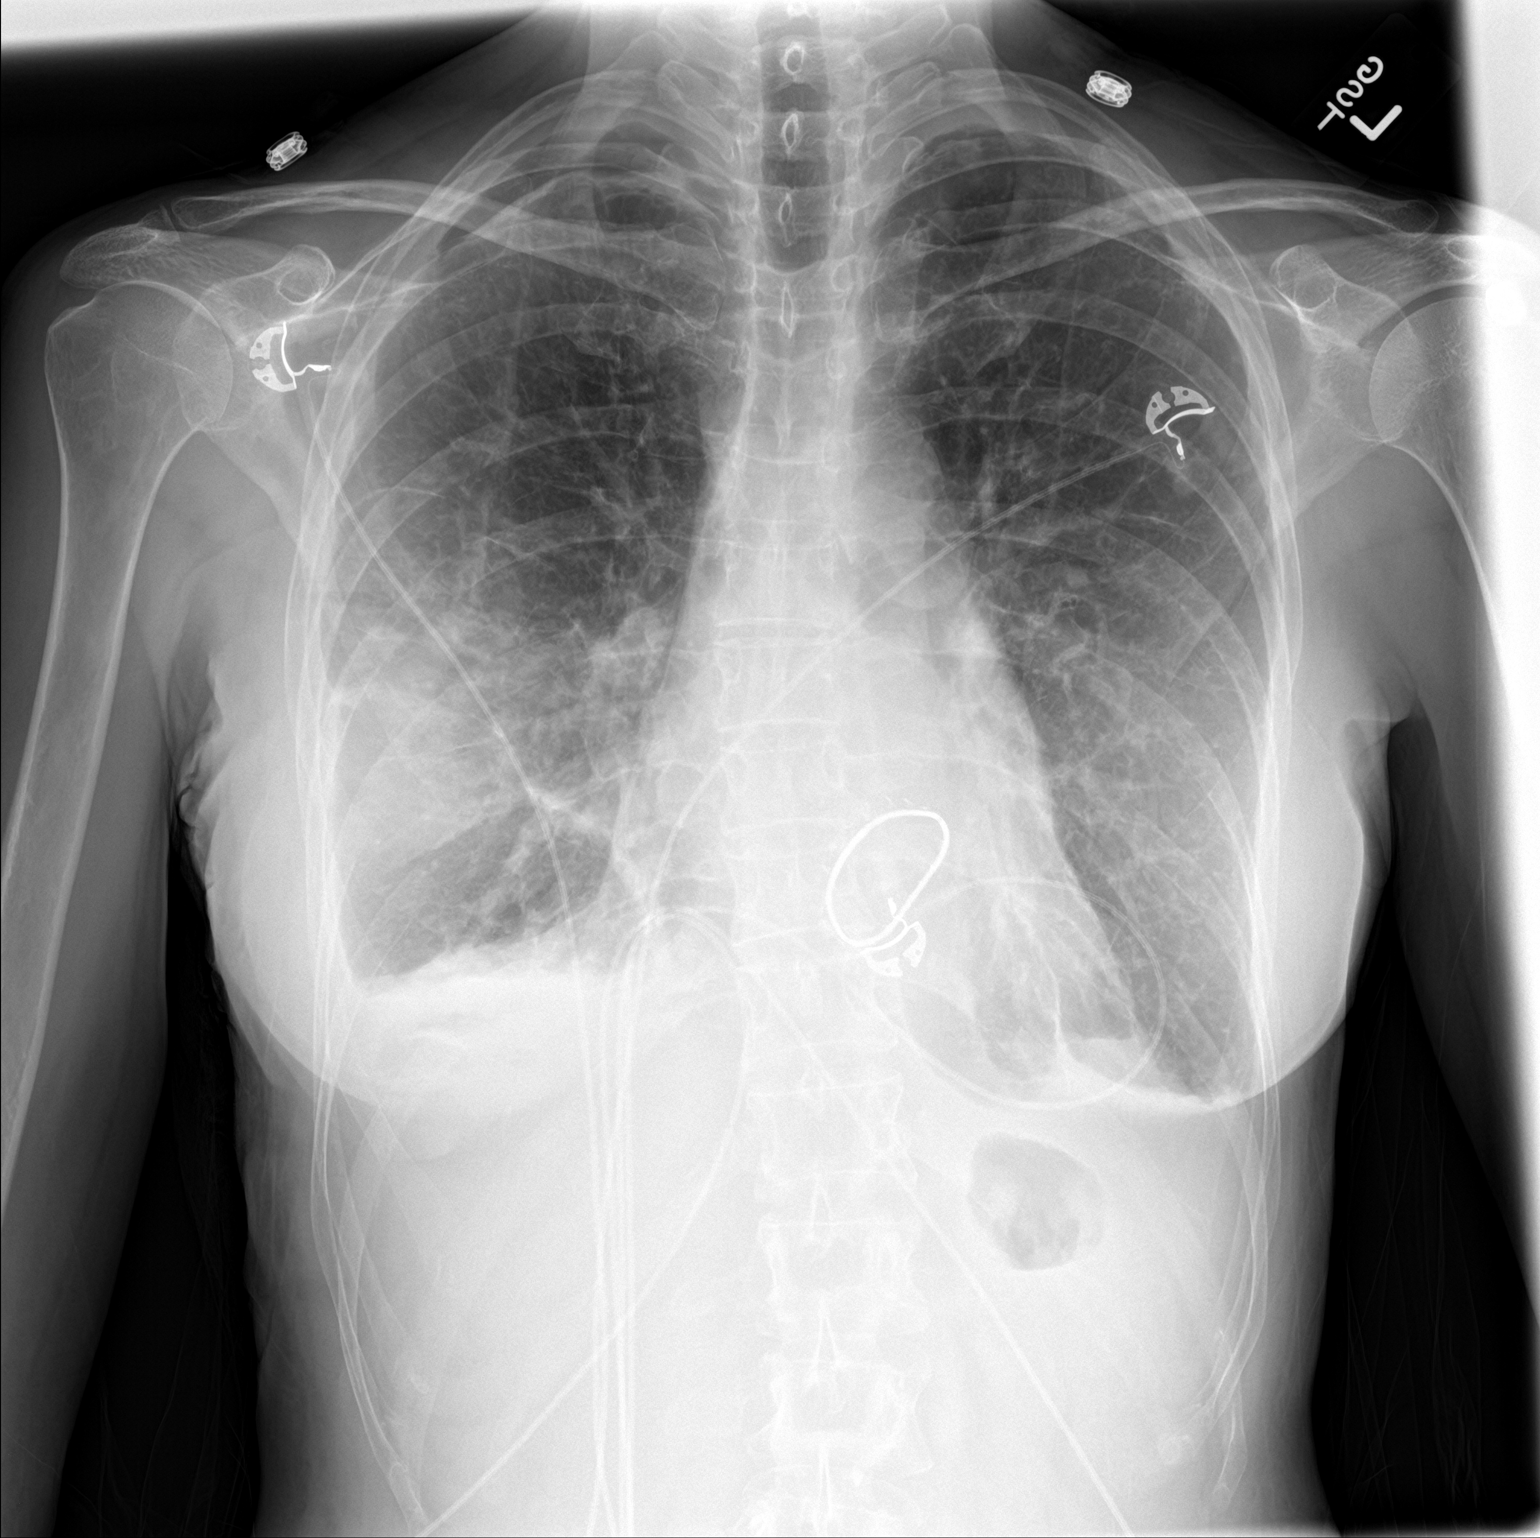

[chest lat]
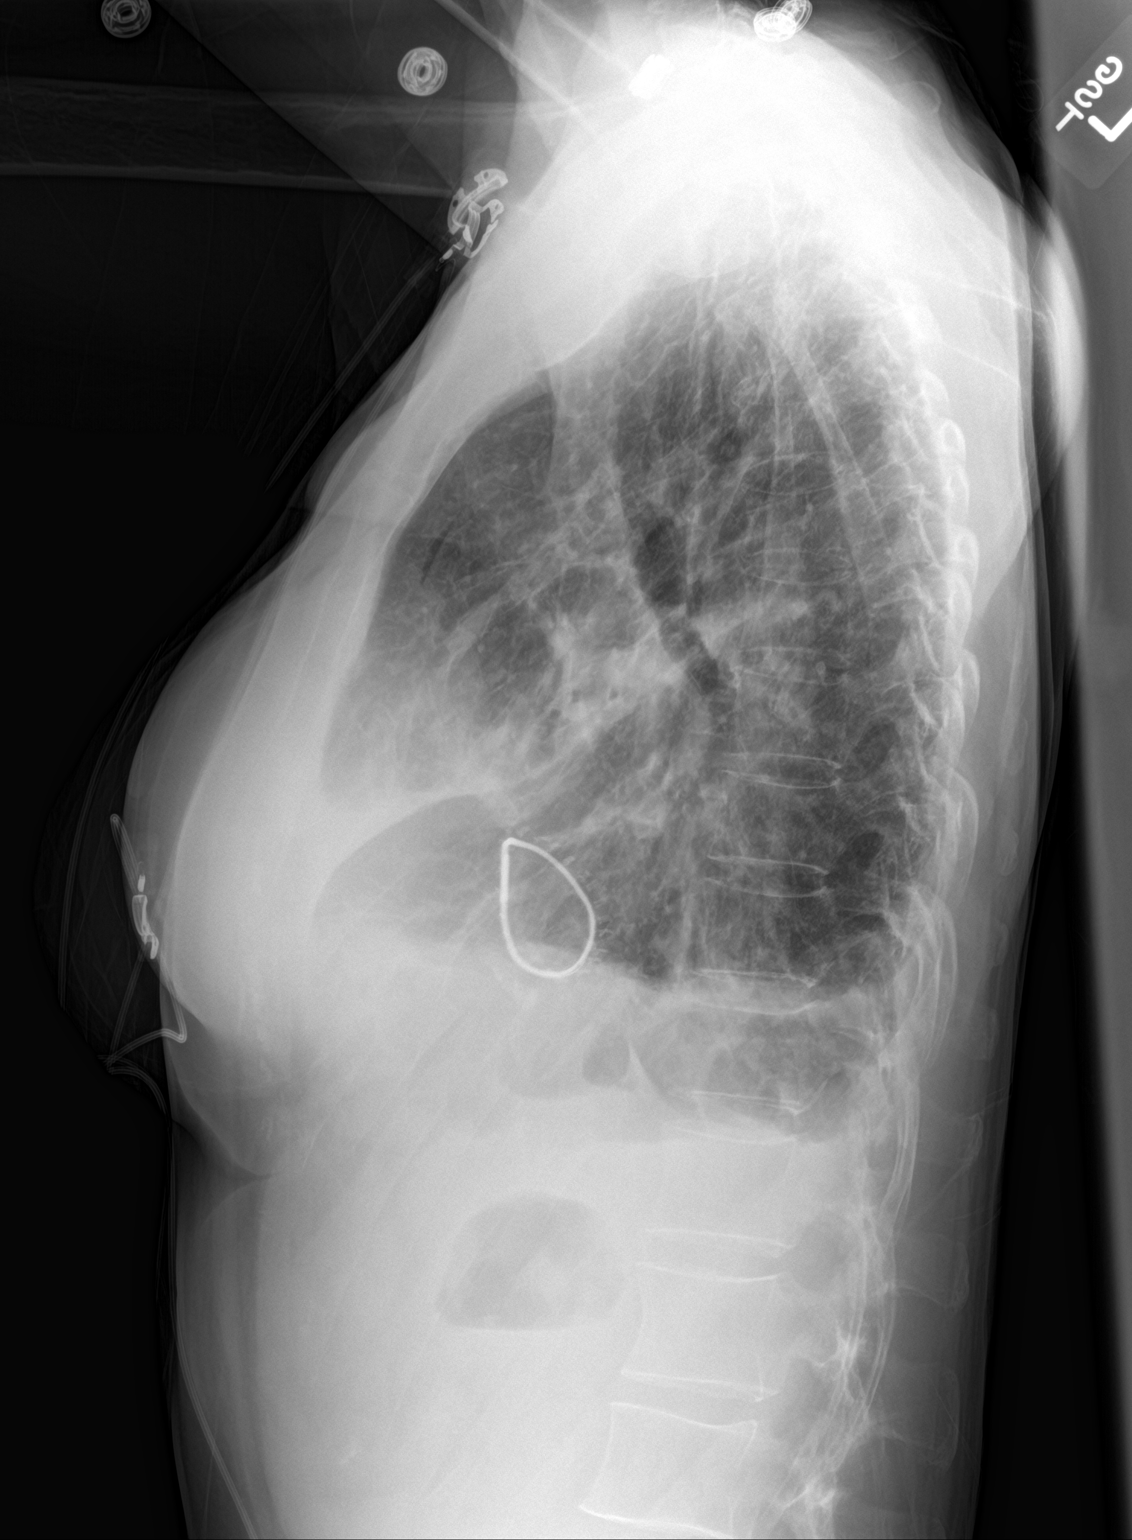

[2 of 2 positions shown; findings below may reference images not displayed]

FINDINGS: Normal cardiomediastinal silhouette. Mitral valve prosthesis
overlies the heart.

RIGHT chest tube has been removed. RIGHT pleural effusion is stable.
Small LEFT pleural effusion also stable. Minimal atelectasis at the
LEFT base.

There is a RIGHT apical pneumothorax approximately 5% which is new
from yesterday. There is RIGHT midlung zone atelectasis along with
fluid in the fissure. This is similar to priors.

No acute osseous findings.  Osteopenia.  Telemetry leads.
IMPRESSION: New 5% RIGHT apical pneumothorax after RIGHT chest tube removal.

Critical Value/emergent results were called by telephone at the time
of interpretation on 10/21/2015 at [DATE] to nurse Montxo, who
verbally acknowledged these results.

## 2017-10-10 ENCOUNTER — Inpatient Hospital Stay (HOSPITAL_BASED_OUTPATIENT_CLINIC_OR_DEPARTMENT_OTHER): Payer: BLUE CROSS/BLUE SHIELD | Admitting: Oncology

## 2017-10-10 ENCOUNTER — Telehealth: Payer: Self-pay | Admitting: Oncology

## 2017-10-10 ENCOUNTER — Inpatient Hospital Stay: Payer: BLUE CROSS/BLUE SHIELD | Attending: Oncology

## 2017-10-10 ENCOUNTER — Telehealth: Payer: Self-pay | Admitting: *Deleted

## 2017-10-10 VITALS — BP 109/74 | HR 76 | Temp 98.5°F | Resp 17 | Ht 69.0 in | Wt 134.7 lb

## 2017-10-10 DIAGNOSIS — N289 Disorder of kidney and ureter, unspecified: Secondary | ICD-10-CM | POA: Diagnosis not present

## 2017-10-10 DIAGNOSIS — C9001 Multiple myeloma in remission: Secondary | ICD-10-CM | POA: Insufficient documentation

## 2017-10-10 DIAGNOSIS — E876 Hypokalemia: Secondary | ICD-10-CM

## 2017-10-10 DIAGNOSIS — Z9484 Stem cells transplant status: Secondary | ICD-10-CM | POA: Diagnosis not present

## 2017-10-10 LAB — CBC WITH DIFFERENTIAL/PLATELET
BASOS PCT: 0 %
Basophils Absolute: 0 10*3/uL (ref 0.0–0.1)
Eosinophils Absolute: 0.1 10*3/uL (ref 0.0–0.5)
Eosinophils Relative: 1 %
HEMATOCRIT: 43 % (ref 34.8–46.6)
Hemoglobin: 14.5 g/dL (ref 11.6–15.9)
LYMPHS PCT: 29 %
Lymphs Abs: 1.5 10*3/uL (ref 0.9–3.3)
MCH: 33.1 pg (ref 25.1–34.0)
MCHC: 33.7 g/dL (ref 31.5–36.0)
MCV: 98.2 fL (ref 79.5–101.0)
Monocytes Absolute: 0.5 10*3/uL (ref 0.1–0.9)
Monocytes Relative: 10 %
NEUTROS ABS: 3 10*3/uL (ref 1.5–6.5)
Neutrophils Relative %: 60 %
PLATELETS: 157 10*3/uL (ref 145–400)
RBC: 4.38 MIL/uL (ref 3.70–5.45)
RDW: 12.7 % (ref 11.2–14.5)
WBC: 5.1 10*3/uL (ref 3.9–10.3)

## 2017-10-10 LAB — BASIC METABOLIC PANEL
ANION GAP: 7 (ref 3–11)
BUN: 28 mg/dL — ABNORMAL HIGH (ref 7–26)
CO2: 28 mmol/L (ref 22–29)
Calcium: 9.9 mg/dL (ref 8.4–10.4)
Chloride: 105 mmol/L (ref 98–109)
Creatinine, Ser: 1.57 mg/dL — ABNORMAL HIGH (ref 0.60–1.10)
GFR, EST AFRICAN AMERICAN: 41 mL/min — AB (ref >60)
GFR, EST NON AFRICAN AMERICAN: 35 mL/min — AB (ref >60)
Glucose, Bld: 84 mg/dL (ref 70–140)
POTASSIUM: 4.9 mmol/L (ref 3.5–5.1)
Sodium: 140 mmol/L (ref 136–145)

## 2017-10-10 NOTE — Telephone Encounter (Signed)
Called pt with change in MD schedule, she agreed to appointment today. Message to schedulers.

## 2017-10-10 NOTE — Progress Notes (Signed)
[facility] OFFICE PROGRESS NOTE   Diagnosis: Multiple myeloma  INTERVAL HISTORY:   [name] returns for a scheduled visit.  She feels well.  No recent infection.  She continues to have discomfort in the left foot following surgery.  No other pain.  She had one evening of diarrhea last weekend.  Objective:  Vital signs in last 24 hours:  Blood pressure 109/74, pulse 76, temperature 98.5 F (36.9 C), temperature source Oral, resp. rate 17, height _0  (1.753 m), weight 134 lb 11.2 oz (61.1 kg), SpO2 100 %. Resp: Lungs clear bilaterally Cardio: Regular rate and rhythm GI: No hepatosplenomegaly Vascular: No leg edema  Musculoskeletal: Mild tenderness at the posterior iliac bilaterally, no pain with motion of the left hip    Lab Results:  Lab Results  Component Value Date   WBC 5.1 10/10/2017   HGB 14.5 10/10/2017   HCT 43.0 10/10/2017   MCV 98.2 10/10/2017   PLT 157 10/10/2017   NEUTROABS 3.0 10/10/2017    CMP     Component Value Date/Time   NA 140 10/10/2017 1537   NA 139 04/14/2017 1532   K 4.9 10/10/2017 1537   K 4.6 04/14/2017 1532   CL 105 10/10/2017 1537   CL 102 07/25/2012 1532   CO2 28 10/10/2017 1537   CO2 27 04/14/2017 1532   GLUCOSE 84 10/10/2017 1537   GLUCOSE 79 04/14/2017 1532   GLUCOSE 70 07/25/2012 1532   BUN 28 (H) 10/10/2017 1537   BUN 23.2 04/14/2017 1532   CREATININE 1.57 (H) 10/10/2017 1537   CREATININE 1.2 (H) 04/14/2017 1532   CALCIUM 9.9 10/10/2017 1537   CALCIUM 9.9 04/14/2017 1532   PROT 6.6 04/14/2017 1532   PROT 6.7 07/30/2014 0813   ALBUMIN 4.3 10/10/2015 1344   ALBUMIN 4.1 07/30/2014 0813   AST 25 10/10/2015 1344   AST 23 07/30/2014 0813   ALT 19 10/10/2015 1344   ALT 18 07/30/2014 0813   ALKPHOS 49 10/10/2015 1344   ALKPHOS 58 07/30/2014 0813   BILITOT 0.9 10/10/2015 1344   BILITOT 1.00 07/30/2014 0813   GFRNONAA 35 (L) 10/10/2017 1537   GFRAA 41 (L) 10/10/2017 1537    Medications: I have reviewed  the patient's current medications.   Assessment/Plan: 1. Multiple myeloma diagnosed in November 2007, IgA kappa, status post high-dose chemotherapy with autologous stem cell support at [facility] in June 2008. She remains in clinical remission. 2. history of Renal insufficiency secondary to multiple myeloma.  3. History of hypokalemia. 4. History of left pelvic pain secondary to a lytic left iliac lesion. A metastatic bone survey revealed stable lucencies at the left sacral ala and medial left iliac, questionable new tiny lucency at the superior aspect of the left sacral ala 5. Simple cyst at the lower pole of the right kidney on a renal ultrasound, 11/08/2006. 6. Multiseptated cyst of the left adnexa on an ultrasound, 11/08/2006. 7. Osteopenia 8. Mitral valve prolapse/mitral regurgitation-minimally invasive mitral valve repair 10/15/2015 9. Report of intermittent rectal bleeding. Normal colonoscopy by Dr. Fuller Plan on 08/20/2011.   Disposition: [name] remains in clinical remission from myeloma.  We will follow-up on the light chains and IgA level from today.  The creatinine is higher today, but it has been this elevated in the past.  She will return for a chemistry panel in 1 month.  [name] will be scheduled for an office visit and repeat bone survey in 6 months.  15 minutes were spent with the  patient today.  The majority of the time was used for counseling and coordination of care.    [name], MD  10/10/2017  4:20 PM

## 2017-10-10 NOTE — Telephone Encounter (Signed)
Scheduled appt per 4/29 los - pt aware - my chart active - no print out wanted.

## 2017-10-10 NOTE — Progress Notes (Signed)
Spoke with Kennyth Lose in lab, requested they add IgA to today's labs.

## 2017-10-11 ENCOUNTER — Other Ambulatory Visit: Payer: Self-pay | Admitting: *Deleted

## 2017-10-11 DIAGNOSIS — C9001 Multiple myeloma in remission: Secondary | ICD-10-CM

## 2017-10-11 LAB — KAPPA/LAMBDA LIGHT CHAINS
Kappa free light chain: 18.7 mg/L (ref 3.3–19.4)
Kappa, lambda light chain ratio: 1.34 (ref 0.26–1.65)
Lambda free light chains: 14 mg/L (ref 5.7–26.3)

## 2017-10-12 LAB — PROTEIN ELECTROPHORESIS, SERUM
A/G RATIO SPE: 1.8 — AB (ref 0.7–1.7)
Albumin ELP: 3.9 g/dL (ref 2.9–4.4)
Alpha-1-Globulin: 0.2 g/dL (ref 0.0–0.4)
Alpha-2-Globulin: 0.5 g/dL (ref 0.4–1.0)
Beta Globulin: 0.8 g/dL (ref 0.7–1.3)
GAMMA GLOBULIN: 0.8 g/dL (ref 0.4–1.8)
Globulin, Total: 2.2 g/dL (ref 2.2–3.9)
TOTAL PROTEIN ELP: 6.1 g/dL (ref 6.0–8.5)

## 2017-10-13 ENCOUNTER — Telehealth: Payer: Self-pay | Admitting: *Deleted

## 2017-10-13 ENCOUNTER — Inpatient Hospital Stay: Payer: BLUE CROSS/BLUE SHIELD | Admitting: Oncology

## 2017-10-13 ENCOUNTER — Inpatient Hospital Stay: Payer: BLUE CROSS/BLUE SHIELD

## 2017-10-13 NOTE — Telephone Encounter (Signed)
-----   Message from Ladell Pier, MD sent at 10/11/2017  5:11 PM EDT ----- Please call patient, light chains are stable, follow-up as scheduled, I added an IgA level to the labs from 10/10/2017, was this submitted?

## 2017-10-31 DIAGNOSIS — M79672 Pain in left foot: Secondary | ICD-10-CM | POA: Diagnosis not present

## 2017-10-31 DIAGNOSIS — Z978 Presence of other specified devices: Secondary | ICD-10-CM | POA: Diagnosis not present

## 2017-10-31 DIAGNOSIS — M25572 Pain in left ankle and joints of left foot: Secondary | ICD-10-CM | POA: Diagnosis not present

## 2017-11-05 ENCOUNTER — Other Ambulatory Visit: Payer: Self-pay | Admitting: Family Medicine

## 2017-11-09 ENCOUNTER — Inpatient Hospital Stay: Payer: BLUE CROSS/BLUE SHIELD

## 2017-11-14 DIAGNOSIS — M25675 Stiffness of left foot, not elsewhere classified: Secondary | ICD-10-CM | POA: Diagnosis not present

## 2017-11-17 ENCOUNTER — Inpatient Hospital Stay: Payer: BLUE CROSS/BLUE SHIELD | Attending: Oncology

## 2017-11-17 DIAGNOSIS — Z9484 Stem cells transplant status: Secondary | ICD-10-CM | POA: Diagnosis not present

## 2017-11-17 DIAGNOSIS — C9001 Multiple myeloma in remission: Secondary | ICD-10-CM | POA: Diagnosis not present

## 2017-11-17 LAB — BASIC METABOLIC PANEL - CANCER CENTER ONLY
Anion gap: 9 (ref 3–11)
BUN: 27 mg/dL — AB (ref 7–26)
CO2: 26 mmol/L (ref 22–29)
Calcium: 9.6 mg/dL (ref 8.4–10.4)
Chloride: 103 mmol/L (ref 98–109)
Creatinine: 1.44 mg/dL — ABNORMAL HIGH (ref 0.60–1.10)
GFR, EST NON AFRICAN AMERICAN: 39 mL/min — AB (ref >60)
GFR, Est AFR Am: 45 mL/min — ABNORMAL LOW (ref >60)
GLUCOSE: 83 mg/dL (ref 70–140)
Potassium: 4.3 mmol/L (ref 3.5–5.1)
Sodium: 138 mmol/L (ref 136–145)

## 2017-11-18 ENCOUNTER — Other Ambulatory Visit: Payer: Self-pay | Admitting: Oncology

## 2017-11-18 ENCOUNTER — Telehealth: Payer: Self-pay | Admitting: Emergency Medicine

## 2017-11-18 DIAGNOSIS — C9001 Multiple myeloma in remission: Secondary | ICD-10-CM

## 2017-11-18 NOTE — Telephone Encounter (Addendum)
Pt verbalized understanding of this note.   ----- Message from Ladell Pier, MD sent at 11/18/2017  7:20 AM EDT ----- Please call patient, kidney function is decreased , but better than on 4/29, f/u a scheduled, I recommend a consult with nephrology. Let me know if she agrees and I will make referral

## 2017-11-22 DIAGNOSIS — M79672 Pain in left foot: Secondary | ICD-10-CM | POA: Diagnosis not present

## 2017-11-22 DIAGNOSIS — M25675 Stiffness of left foot, not elsewhere classified: Secondary | ICD-10-CM | POA: Diagnosis not present

## 2017-11-24 ENCOUNTER — Encounter: Payer: Self-pay | Admitting: Oncology

## 2017-11-24 DIAGNOSIS — M79672 Pain in left foot: Secondary | ICD-10-CM | POA: Diagnosis not present

## 2017-11-29 DIAGNOSIS — M79672 Pain in left foot: Secondary | ICD-10-CM | POA: Diagnosis not present

## 2017-11-29 DIAGNOSIS — M25675 Stiffness of left foot, not elsewhere classified: Secondary | ICD-10-CM | POA: Diagnosis not present

## 2017-12-01 DIAGNOSIS — M79672 Pain in left foot: Secondary | ICD-10-CM | POA: Diagnosis not present

## 2017-12-06 DIAGNOSIS — M25675 Stiffness of left foot, not elsewhere classified: Secondary | ICD-10-CM | POA: Diagnosis not present

## 2017-12-06 DIAGNOSIS — M79672 Pain in left foot: Secondary | ICD-10-CM | POA: Diagnosis not present

## 2017-12-08 DIAGNOSIS — M79672 Pain in left foot: Secondary | ICD-10-CM | POA: Diagnosis not present

## 2017-12-08 DIAGNOSIS — M25675 Stiffness of left foot, not elsewhere classified: Secondary | ICD-10-CM | POA: Diagnosis not present

## 2017-12-13 DIAGNOSIS — M25675 Stiffness of left foot, not elsewhere classified: Secondary | ICD-10-CM | POA: Diagnosis not present

## 2017-12-26 ENCOUNTER — Telehealth: Payer: Self-pay | Admitting: *Deleted

## 2017-12-26 NOTE — Telephone Encounter (Signed)
Received call earlier from pt stating that Dr Benay Spice made a referral to [facility] but they were not able to view this.  Copied Referral & demographics & faxed to [facility] @ [phone].  Pt informed of this.

## 2017-12-27 DIAGNOSIS — M79672 Pain in left foot: Secondary | ICD-10-CM | POA: Diagnosis not present

## 2018-01-06 DIAGNOSIS — M25675 Stiffness of left foot, not elsewhere classified: Secondary | ICD-10-CM | POA: Diagnosis not present

## 2018-01-10 DIAGNOSIS — M25675 Stiffness of left foot, not elsewhere classified: Secondary | ICD-10-CM | POA: Diagnosis not present

## 2018-01-24 DIAGNOSIS — M25675 Stiffness of left foot, not elsewhere classified: Secondary | ICD-10-CM | POA: Diagnosis not present

## 2018-01-26 DIAGNOSIS — M25675 Stiffness of left foot, not elsewhere classified: Secondary | ICD-10-CM | POA: Diagnosis not present

## 2018-01-31 DIAGNOSIS — M25675 Stiffness of left foot, not elsewhere classified: Secondary | ICD-10-CM | POA: Diagnosis not present

## 2018-02-02 DIAGNOSIS — M25675 Stiffness of left foot, not elsewhere classified: Secondary | ICD-10-CM | POA: Diagnosis not present

## 2018-02-16 ENCOUNTER — Encounter: Payer: Self-pay | Admitting: Family Medicine

## 2018-02-16 ENCOUNTER — Ambulatory Visit: Payer: BLUE CROSS/BLUE SHIELD | Admitting: Family Medicine

## 2018-02-16 VITALS — BP 90/60 | HR 88 | Temp 98.3°F | Ht 69.0 in | Wt 133.2 lb

## 2018-02-16 DIAGNOSIS — M542 Cervicalgia: Secondary | ICD-10-CM | POA: Diagnosis not present

## 2018-02-16 MED ORDER — TIZANIDINE HCL 2 MG PO TABS: 2.0000 mg | ORAL_TABLET | Freq: Every evening | ORAL | 0 refills | Status: DC | PRN
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
## 2018-02-16 NOTE — Patient Instructions (Signed)
BEFORE YOU LEAVE: -follow up: 1 month  Can use heat, topical menthol and the muscle relaxer at night as needed.  Do the postural neck strengthening exercises daily: -stand with heals, buttocks, shoulder blades and head against wall -press back against wall gently with your head for 5 seconds, then against counter pressure with finger tips: to the right for 5 seconds, forward for 5 seconds, then to the left for 5 second (keep head straight and against wall the entire time) -repeat 10 times  Call if you want to do the formal physical therapy.

## 2018-03-23 ENCOUNTER — Telehealth: Payer: Self-pay | Admitting: Cardiology

## 2018-03-23 NOTE — Telephone Encounter (Signed)
New Message          Patient is calling today to schedule her yearly and she also tried to schedule her a echo. I didn't have an order, so does she need to do another Echo or not. Pls call patient to advise.

## 2018-03-27 DIAGNOSIS — Z8579 Personal history of other malignant neoplasms of lymphoid, hematopoietic and related tissues: Secondary | ICD-10-CM | POA: Diagnosis not present

## 2018-03-27 DIAGNOSIS — I10 Essential (primary) hypertension: Secondary | ICD-10-CM | POA: Diagnosis not present

## 2018-03-27 DIAGNOSIS — N183 Chronic kidney disease, stage 3 (moderate): Secondary | ICD-10-CM | POA: Diagnosis not present

## 2018-03-27 NOTE — Telephone Encounter (Signed)
Sent [facility] scheduling a message to call this pt and schedule her echo a week prior to her OV with Dr Meda Coffee on 06/22/2018.

## 2018-03-27 NOTE — Telephone Encounter (Signed)
Pts echo is scheduled for 12/30 at 8:30am.  Pt made aware of appt date and time by [facility] scheduling.

## 2018-04-11 ENCOUNTER — Inpatient Hospital Stay: Payer: BLUE CROSS/BLUE SHIELD | Attending: Oncology | Admitting: Oncology

## 2018-04-11 ENCOUNTER — Telehealth: Payer: Self-pay | Admitting: Oncology

## 2018-04-11 ENCOUNTER — Inpatient Hospital Stay: Payer: BLUE CROSS/BLUE SHIELD

## 2018-04-11 VITALS — BP 139/93 | HR 77 | Temp 99.5°F | Resp 19 | Ht 69.0 in | Wt 134.4 lb

## 2018-04-11 DIAGNOSIS — M542 Cervicalgia: Secondary | ICD-10-CM

## 2018-04-11 DIAGNOSIS — Z9221 Personal history of antineoplastic chemotherapy: Secondary | ICD-10-CM | POA: Insufficient documentation

## 2018-04-11 DIAGNOSIS — G8929 Other chronic pain: Secondary | ICD-10-CM | POA: Diagnosis not present

## 2018-04-11 DIAGNOSIS — Z9484 Stem cells transplant status: Secondary | ICD-10-CM | POA: Diagnosis not present

## 2018-04-11 DIAGNOSIS — R102 Pelvic and perineal pain: Secondary | ICD-10-CM | POA: Insufficient documentation

## 2018-04-11 DIAGNOSIS — C9001 Multiple myeloma in remission: Secondary | ICD-10-CM | POA: Diagnosis not present

## 2018-04-11 DIAGNOSIS — M8580 Other specified disorders of bone density and structure, unspecified site: Secondary | ICD-10-CM | POA: Diagnosis not present

## 2018-04-11 DIAGNOSIS — Z23 Encounter for immunization: Secondary | ICD-10-CM | POA: Insufficient documentation

## 2018-04-11 LAB — CBC WITH DIFFERENTIAL (CANCER CENTER ONLY)
ABS IMMATURE GRANULOCYTES: 0.02 10*3/uL (ref 0.00–0.07)
BASOS PCT: 0 %
Basophils Absolute: 0 10*3/uL (ref 0.0–0.1)
EOS ABS: 0.1 10*3/uL (ref 0.0–0.5)
EOS PCT: 1 %
HCT: 41.6 % (ref 36.0–46.0)
Hemoglobin: 13.9 g/dL (ref 12.0–15.0)
Immature Granulocytes: 0 %
Lymphocytes Relative: 19 %
Lymphs Abs: 1.1 10*3/uL (ref 0.7–4.0)
MCH: 32.2 pg (ref 26.0–34.0)
MCHC: 33.4 g/dL (ref 30.0–36.0)
MCV: 96.3 fL (ref 80.0–100.0)
MONO ABS: 0.5 10*3/uL (ref 0.1–1.0)
Monocytes Relative: 9 %
NEUTROS ABS: 4.2 10*3/uL (ref 1.7–7.7)
Neutrophils Relative %: 71 %
PLATELETS: 162 10*3/uL (ref 150–400)
RBC: 4.32 MIL/uL (ref 3.87–5.11)
RDW: 12.5 % (ref 11.5–15.5)
WBC: 5.9 10*3/uL (ref 4.0–10.5)
nRBC: 0 % (ref 0.0–0.2)

## 2018-04-11 LAB — CMP (CANCER CENTER ONLY)
ALT: 17 U/L (ref 0–44)
AST: 24 U/L (ref 15–41)
Albumin: 3.9 g/dL (ref 3.5–5.0)
Alkaline Phosphatase: 61 U/L (ref 38–126)
Anion gap: 9 (ref 5–15)
BUN: 21 mg/dL — AB (ref 6–20)
CO2: 28 mmol/L (ref 22–32)
Calcium: 9.6 mg/dL (ref 8.9–10.3)
Chloride: 103 mmol/L (ref 98–111)
Creatinine: 1.29 mg/dL — ABNORMAL HIGH (ref 0.44–1.00)
GFR, Est AFR Am: 52 mL/min — ABNORMAL LOW (ref >60)
GFR, Estimated: 45 mL/min — ABNORMAL LOW (ref >60)
GLUCOSE: 82 mg/dL (ref 70–99)
POTASSIUM: 4.1 mmol/L (ref 3.5–5.1)
Sodium: 140 mmol/L (ref 135–145)
Total Bilirubin: 0.9 mg/dL (ref 0.3–1.2)
Total Protein: 6.7 g/dL (ref 6.5–8.1)

## 2018-04-11 MED ORDER — INFLUENZA VAC SPLIT QUAD 0.5 ML IM SUSY
0.5000 mL | PREFILLED_SYRINGE | Freq: Once | INTRAMUSCULAR | Status: AC
  Administered 2018-04-11: 0.5 mL via INTRAMUSCULAR

## 2018-04-11 MED ORDER — INFLUENZA VAC SPLIT QUAD 0.5 ML IM SUSY
PREFILLED_SYRINGE | INTRAMUSCULAR | Status: AC
  Filled 2018-04-11: qty 0.5
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
## 2018-04-11 NOTE — Telephone Encounter (Signed)
Scheduled appt per 10/29 sch message -gave patient aVS and calender per los.

## 2018-04-12 LAB — PROTEIN ELECTROPHORESIS, SERUM
A/G RATIO SPE: 1.8 — AB (ref 0.7–1.7)
ALPHA-1-GLOBULIN: 0.2 g/dL (ref 0.0–0.4)
Albumin ELP: 3.9 g/dL (ref 2.9–4.4)
Alpha-2-Globulin: 0.5 g/dL (ref 0.4–1.0)
BETA GLOBULIN: 0.8 g/dL (ref 0.7–1.3)
Gamma Globulin: 0.7 g/dL (ref 0.4–1.8)
Globulin, Total: 2.2 g/dL (ref 2.2–3.9)
TOTAL PROTEIN ELP: 6.1 g/dL (ref 6.0–8.5)

## 2018-04-12 LAB — KAPPA/LAMBDA LIGHT CHAINS
KAPPA, LAMDA LIGHT CHAIN RATIO: 1.37 (ref 0.26–1.65)
Kappa free light chain: 15.9 mg/L (ref 3.3–19.4)
Lambda free light chains: 11.6 mg/L (ref 5.7–26.3)

## 2018-04-12 LAB — IGA: IGA: 109 mg/dL (ref 87–352)

## 2018-05-05 ENCOUNTER — Ambulatory Visit (HOSPITAL_BASED_OUTPATIENT_CLINIC_OR_DEPARTMENT_OTHER)
Admission: RE | Admit: 2018-05-05 | Discharge: 2018-05-05 | Disposition: A | Discharge status: Home / Self Care | Payer: BLUE CROSS/BLUE SHIELD | Source: Ambulatory Visit | Attending: Oncology | Admitting: Oncology

## 2018-05-05 DIAGNOSIS — C9001 Multiple myeloma in remission: Secondary | ICD-10-CM

## 2018-05-08 ENCOUNTER — Other Ambulatory Visit: Payer: Self-pay | Admitting: Family Medicine

## 2018-05-30 ENCOUNTER — Other Ambulatory Visit: Payer: Self-pay | Admitting: Cardiology

## 2018-06-12 ENCOUNTER — Other Ambulatory Visit: Payer: Self-pay

## 2018-06-12 ENCOUNTER — Ambulatory Visit (HOSPITAL_COMMUNITY): Payer: BLUE CROSS/BLUE SHIELD | Attending: Cardiovascular Disease

## 2018-06-12 DIAGNOSIS — I340 Nonrheumatic mitral (valve) insufficiency: Secondary | ICD-10-CM

## 2018-06-22 ENCOUNTER — Encounter: Payer: Self-pay | Admitting: Cardiology

## 2018-06-22 ENCOUNTER — Ambulatory Visit: Payer: BLUE CROSS/BLUE SHIELD | Admitting: Cardiology

## 2018-06-22 VITALS — BP 106/60 | HR 84 | Ht 69.0 in | Wt 134.4 lb

## 2018-06-22 DIAGNOSIS — I428 Other cardiomyopathies: Secondary | ICD-10-CM | POA: Diagnosis not present

## 2018-06-22 DIAGNOSIS — Z9889 Other specified postprocedural states: Secondary | ICD-10-CM | POA: Diagnosis not present

## 2018-06-22 DIAGNOSIS — I10 Essential (primary) hypertension: Secondary | ICD-10-CM | POA: Diagnosis not present

## 2018-06-22 DIAGNOSIS — I340 Nonrheumatic mitral (valve) insufficiency: Secondary | ICD-10-CM
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
## 2018-06-22 NOTE — Patient Instructions (Signed)
Medication Instructions:   Your physician recommends that you continue on your current medications as directed. Please refer to the Current Medication list given to you today.  If you need a refill on your cardiac medications before your next appointment, please call your pharmacy.     Testing/Procedures:  Your physician has requested that you have an echocardiogram. Echocardiography is a painless test that uses sound waves to create images of your heart. It provides your doctor with information about the size and shape of your heart and how well your heart's chambers and valves are working. This procedure takes approximately one hour. There are no restrictions for this procedure.  CHECKOUT PLEASE SCHEDULE THE PATIENTS ECHO TODAY FOR ONE YEAR OUT, PRIOR TO ONE YEAR FOLLOW-UP APPOINTMENT WITH DR Meda Coffee    Follow-Up:  Your physician wants you to follow-up in: [place] will receive a reminder letter in the mail two months in advance. If you don't receive a letter, please call our office to schedule the follow-up appointment.  PLEASE SCHEDULE THE PATIENTS ECHO PRIOR TO ONE YEAR FOLLOW-UP APPOINTMENT--PLEASE SCHEDULE THIS TODAY

## 2018-07-31 DIAGNOSIS — M79672 Pain in left foot: Secondary | ICD-10-CM | POA: Diagnosis not present

## 2018-07-31 DIAGNOSIS — M19072 Primary osteoarthritis, left ankle and foot: Secondary | ICD-10-CM | POA: Diagnosis not present

## 2018-08-27 ENCOUNTER — Other Ambulatory Visit: Payer: Self-pay | Admitting: Cardiology

## 2018-09-03 ENCOUNTER — Encounter: Payer: Self-pay | Admitting: Family Medicine

## 2018-09-04 ENCOUNTER — Encounter: Payer: BLUE CROSS/BLUE SHIELD | Admitting: Family Medicine

## 2018-09-04 NOTE — Telephone Encounter (Signed)
Ria Comment - please note patient message. She would be agreeable to a web based visit. See what you can do to coordinate setting this up for her; today would be great if possible!

## 2018-09-04 NOTE — Telephone Encounter (Signed)
Called and spoke to the patient this morning. She chose to reschedule.

## 2018-09-06 ENCOUNTER — Telehealth: Payer: Self-pay

## 2018-09-06 NOTE — Telephone Encounter (Signed)
Spoke with patient. A webex appointment has been scheduled. Patient is aware to check their email for instructions and link.

## 2018-09-11 NOTE — Progress Notes (Signed)
Virtual Visit via Video Note  I connected with@ on 09/13/18 at  8:00 AM EDT by a video enabled telemedicine application and verified that I am speaking with the correct person using two identifiers.  Location patient: home Location provider:work or home office Persons participating in the virtual visit: patient, provider  I discussed the limitations of evaluation and management by telemedicine and the availability of in person appointments. The patient expressed understanding and agreed to proceed.   Cristina Garcia DOB: [birth year] Encounter date: 09/13/2018  This is a 58 y.o. female who presents to establish care. Chief Complaint  Patient presents with  . Transitions Of Care   Transitioning from Dr. Sherren Mocha History of present illness:  Currently working from home due to COVID crisis.  Due for annual exam; but has frequent exams with specialists so not overly worried about getting in for this soon.   HTN: on lisinopril 34m daily. This is helping to keeping bp in check. Takes toprol at night. Has had issues with hypotension in past, but bp did elevate off of lisinopril when held by nephrology. Doesn't check blood pressures at home. Knows when it drops low. Not been as big of issue lately. More of issue right after surgery.   MVP s/p repair in 2017:follows with KEna Dawley- last visit 06/2018 and reports great exercise tolerance.  Osteoporosis: dx tied in with the myeloma. On oscal daily.  CKD: slightly elevated creat; [facility]; they held ace due to low bp and no indicated need. Related to multiple myeloma.   Multiple myeloma in remission: follows with GBetsy Coder gets bloodwork q 6 months.   Anxiety: can be a little more tightly wound than average person. Walking does help her with this.   Working on exercise; has 2 mile route outside she does. Been doing this with warmer weather. Learning to live with foot pain post-op. Feels that she has bones of "old  person" due to myeloma.   Seasonal allergies: don't bother her too much; does use OTC allergy medication.   RAD: just tends to get more bronchitis with colds; never dx with asthma. Not using any regular inhalers. Last illness was January - fatigue, chills, out for 2 days. Not many resp sx at that time.    Past Medical History:  Diagnosis Date  . Allergy   . Anxiety   . [place] 12/18[place]2014   24 hour event monitor with frequent PAC's, PVC's and short runs of atrial tachycardia - no sustained arrhythmias or atrial fibrillation   . Chronic kidney disease (CKD)   . Constipation   . H/O multiple myeloma    clinical remission  . Hypertension   . Mitral regurgitation   . Mitral valve prolapse   . Osteoporosis   . Pleural effusion, right 11/03/2015  . S/P minimally invasive mitral valve repair 10/15/2015   Complex valvuloplasty including Alfieri edge-to-edge repair, generous quadrangular resection (shortening) of posterior leaflet, sliding leaflet plasty, Gore-tex neochord placement x6, and 38 mm Edwards Physio II ring annuloplasty via right mini thoracotomy approach    Past Surgical History:  Procedure Laterality Date  . BREAST ENHANCEMENT SURGERY  2000  . CARDIAC CATHETERIZATION N/A 08/18/2015   Procedure: Right/Left Heart Cath and Coronary Angiography;  Surgeon: DLarey Dresser MD;  [facility];  Service: Cardiovascular;  Laterality: N/A;  . [place] . HAMMERTOE RECONSTRUCTION WITH WEIL OSTEOTOMY Left 09/09/2016   Procedure: Left 2-4 MT Weil osteotomies; Left  2nd hammertoe correction and extensor tendon lengthing;  Surgeon: Wylene Simmer, MD;  Location: [place];  Service: Orthopedics;  Laterality: Left;  . LIMBAL STEM CELL TRANSPLANT  2008  . MITRAL VALVE REPAIR Right 10/15/2015   Procedure: MINIMALLY INVASIVE MITRAL VALVE REPAIR (MVR);  Surgeon: Rexene Alberts, MD;  Location: [place];  Service: Open Heart Surgery;  Laterality: Right;   . OOPHORECTOMY  1991   left; got cyst on ovary with torsion when pregnant.  . TEE WITHOUT CARDIOVERSION N/A 09/05/2013   Procedure: TRANSESOPHAGEAL ECHOCARDIOGRAM (TEE);  Surgeon: Larey Dresser, MD;  Location: [place];  Service: Cardiovascular;  Laterality: N/A;  . TEE WITHOUT CARDIOVERSION N/A 08/18/2015   Procedure: TRANSESOPHAGEAL ECHOCARDIOGRAM (TEE);  Surgeon: Larey Dresser, MD;  Location: [place];  Service: Cardiovascular;  Laterality: N/A;  . TEE WITHOUT CARDIOVERSION N/A 10/15/2015   Procedure: TRANSESOPHAGEAL ECHOCARDIOGRAM (TEE);  Surgeon: Rexene Alberts, MD;  Location: [place];  Service: Open Heart Surgery;  Laterality: N/A;  . WISDOM TOOTH EXTRACTION  1975   No Known Allergies Current Meds  Medication Sig  . acyclovir (ZOVIRAX) 200 MG capsule Take 200 mg by mouth daily as needed (BREAKOUTS). Reported on 12/05/2015  . aspirin 81 MG tablet Take 81 mg by mouth daily. Reported on 10/08/2015  . calcium carbonate (OS-CAL) 600 MG TABS Take 600 mg by mouth daily with breakfast.   . cetirizine (ZYRTEC) 10 MG tablet Take 10 mg by mouth daily.  . Estrogens, Conjugated (PREMARIN VA) Place 1 application vaginally daily as needed (dryness).   Marland Kitchen lisinopril (PRINIVIL,ZESTRIL) 5 MG tablet Take 1 tablet (5 mg total) by mouth every morning.  . metoprolol succinate (TOPROL-XL) 25 MG 24 hr tablet Take 0.5 tablets (12.5 mg total) by mouth daily. Please keep upcoming appt in January with Dr. Meda Coffee. Thank yo  . Multiple Vitamins-Minerals (MULTIVITAL PO) Take 1 tablet by mouth daily.    . [DISCONTINUED] lisinopril (PRINIVIL,ZESTRIL) 5 MG tablet TAKE 1 TABLET BY MOUTH EVERY MORNING   Social History   Tobacco Use  . Smoking status: Never Smoker  . Smokeless tobacco: Never Used  Substance Use Topics  . Alcohol use: Yes    Alcohol/week: 2.0 standard drinks    Types: 2 Glasses of wine per week    Comment: socially   Family History  Problem Relation Age of Onset  . Non-Hodgkin's lymphoma  Mother   . Congestive Heart Failure Mother   . Rheumatic fever Mother        with mitral valve replacment  . Diabetes Mother   . Hypertension Father   . Diabetes Other        family hx  . Hypertension Other        family hx  . Lymphoma Other        family hx  . Other Maternal Grandmother        died at 45; no known med problems  . Renal cancer Paternal Grandmother   . Colon cancer Neg Hx   . Stomach cancer Neg Hx      Review of Systems  Constitutional: Negative for fatigue and fever.  Respiratory: Negative for cough and shortness of breath.     Objective:  There were no vitals taken for this visit.      BP Readings from Last 3 Encounters:  06/22/18 106/60  04/11/18 (!) 139/93  02/16/18 90/60   Wt Readings from Last 3 Encounters:  06/22/18 134 lb 6.4 oz (61 kg)  04/11/18  134 lb 6.4 oz (61 kg)  02/16/18 133 lb 3.2 oz (60.4 kg)    EXAM:  GENERAL: alert, oriented, appears well and in no acute distress  HEENT: atraumatic, conjunctiva clear, no obvious abnormalities on inspection of external nose and ears  NECK: normal movements of the head and neck  LUNGS: on inspection no signs of respiratory distress, breathing rate appears normal, no obvious gross SOB, gasping or wheezing  CV: no obvious cyanosis  MS: moves all visible extremities without noticeable abnormality  PSYCH/NEURO: pleasant and cooperative, no obvious depression or anxiety, speech and thought processing grossly intact  Assessment/Plan  1. Screening for breast cancer Will complete once corona risk has improved. - MM DIGITAL SCREENING BILATERAL; Future  2. Essential hypertension Stable; continue current medications.  - lisinopril (PRINIVIL,ZESTRIL) 5 MG tablet; Take 1 tablet (5 mg total) by mouth every morning.  Dispense: 90 tablet; Refill: 1  3. Osteoporosis, unspecified osteoporosis type, unspecified pathological fracture presence Taking oscal.   4. Chronic kidney disease, unspecified CKD  stage Follows with nephrology; has been stable.  5. MULTIPLE MYELOMA, IN REMISSION Follows with oncology regularly; bloodwork through them  6. Left foot pain Tolerating; is able to exercise and is adjusting to mild discomfort.   7. S/P minimally invasive mitral valve repair Has done well s/p valve surgery. Great exercise tolerance  8. Allergic state, subsequent encounter otc antihistamines prn.  We will have her back in fall for physical, pap, Tdap and she will complete bloodwork prior to appointment.    I discussed the assessment and treatment plan with the patient. The patient was provided an opportunity to ask questions and all were answered. The patient agreed with the plan and demonstrated an understanding of the instructions.   The patient was advised to call back or seek an in-person evaluation if the symptoms worsen or if the condition fails to improve as anticipated.    Micheline Rough, MD

## 2018-09-12 ENCOUNTER — Telehealth: Payer: Self-pay

## 2018-09-12 NOTE — Telephone Encounter (Signed)
Copied from [place] [id number]. Topic: General - Inquiry >> Sep 11, 2018  1:40 PM Scherrie Gerlach wrote: Reason for CRM: pt states she needs the link for her web visit with Dr Ethlyn Gallery on 09/13/2018 sent to her work email    [email or web address].  pt was unable to forward the original one sent, but her work computer is set up for this type of call. Can you please resend?  Thank you!

## 2018-09-12 NOTE — Telephone Encounter (Signed)
Email has been sent to the requested email.  Patient is aware.

## 2018-09-13 ENCOUNTER — Encounter: Payer: Self-pay | Admitting: Family Medicine

## 2018-09-13 ENCOUNTER — Ambulatory Visit (INDEPENDENT_AMBULATORY_CARE_PROVIDER_SITE_OTHER): Payer: BLUE CROSS/BLUE SHIELD | Admitting: Family Medicine

## 2018-09-13 ENCOUNTER — Other Ambulatory Visit: Payer: Self-pay

## 2018-09-13 DIAGNOSIS — Z1239 Encounter for other screening for malignant neoplasm of breast: Secondary | ICD-10-CM | POA: Diagnosis not present

## 2018-09-13 DIAGNOSIS — E785 Hyperlipidemia, unspecified: Secondary | ICD-10-CM

## 2018-09-13 DIAGNOSIS — M810 Age-related osteoporosis without current pathological fracture: Secondary | ICD-10-CM

## 2018-09-13 DIAGNOSIS — I10 Essential (primary) hypertension: Secondary | ICD-10-CM

## 2018-09-13 DIAGNOSIS — M79672 Pain in left foot: Secondary | ICD-10-CM

## 2018-09-13 DIAGNOSIS — Z9889 Other specified postprocedural states: Secondary | ICD-10-CM

## 2018-09-13 DIAGNOSIS — T7840XD Allergy, unspecified, subsequent encounter: Secondary | ICD-10-CM

## 2018-09-13 DIAGNOSIS — N189 Chronic kidney disease, unspecified: Secondary | ICD-10-CM | POA: Diagnosis not present

## 2018-09-13 DIAGNOSIS — C9001 Multiple myeloma in remission: Secondary | ICD-10-CM

## 2018-09-13 MED ORDER — LISINOPRIL 5 MG PO TABS: 5.0000 mg | ORAL_TABLET | Freq: Every morning | ORAL | 1 refills | Status: DC

## 2018-10-10 ENCOUNTER — Telehealth: Payer: Self-pay | Admitting: Oncology

## 2018-10-10 NOTE — Telephone Encounter (Signed)
Scheduled appt per 4/27 sch msg. Mailed prinout.

## 2018-10-12 ENCOUNTER — Inpatient Hospital Stay: Payer: BLUE CROSS/BLUE SHIELD | Admitting: Oncology

## 2018-10-12 ENCOUNTER — Inpatient Hospital Stay: Payer: BLUE CROSS/BLUE SHIELD

## 2018-11-30 ENCOUNTER — Other Ambulatory Visit: Payer: Self-pay

## 2018-11-30 ENCOUNTER — Encounter: Payer: Self-pay | Admitting: Internal Medicine

## 2018-11-30 ENCOUNTER — Ambulatory Visit (INDEPENDENT_AMBULATORY_CARE_PROVIDER_SITE_OTHER): Payer: BC Managed Care – PPO | Admitting: Internal Medicine

## 2018-11-30 VITALS — BP 140/64 | HR 88 | Temp 98.4°F | Wt 133.2 lb

## 2018-11-30 DIAGNOSIS — N3001 Acute cystitis with hematuria: Secondary | ICD-10-CM

## 2018-11-30 DIAGNOSIS — R319 Hematuria, unspecified: Secondary | ICD-10-CM

## 2018-11-30 LAB — POCT URINALYSIS DIPSTICK
Bilirubin, UA: NEGATIVE
Glucose, UA: NEGATIVE
Ketones, UA: NEGATIVE
Nitrite, UA: NEGATIVE
Protein, UA: POSITIVE — AB
Spec Grav, UA: 1.01 (ref 1.010–1.025)
Urobilinogen, UA: 0.2 E.U./dL
pH, UA: 7 (ref 5.0–8.0)

## 2018-11-30 MED ORDER — SULFAMETHOXAZOLE-TRIMETHOPRIM 800-160 MG PO TABS: 1.0000 | ORAL_TABLET | Freq: Two times a day (BID) | ORAL | 0 refills | Status: DC

## 2018-11-30 NOTE — Progress Notes (Signed)
Acute Office Visit     CC/Reason for Visit: Blood in urine  HPI: Cristina Garcia is a 58 y.o. female who is coming in today for the above mentioned reasons. This am she noticed blood in the toilet. She is postmenopausal. For the past 2 weeks she has been having some mild suprapubic pain and frequency. She feels like she cannot empty her bladder completely. No fevers.   Past Medical/Surgical History: Past Medical History:  Diagnosis Date  . Allergy   . Anxiety   . Atrial tachycardia ([place]) 12/18/2014   24 hour event monitor with frequent PAC's, PVC's and short runs of atrial tachycardia - no sustained arrhythmias or atrial fibrillation   . Chronic kidney disease (CKD)   . Constipation   . H/O multiple myeloma    clinical remission  . Hypertension   . Mitral regurgitation   . Mitral valve prolapse   . Osteoporosis   . Pleural effusion, right 11/03/2015  . S/P minimally invasive mitral valve repair 10/15/2015   Complex valvuloplasty including Alfieri edge-to-edge repair, generous quadrangular resection (shortening) of posterior leaflet, sliding leaflet plasty, Gore-tex neochord placement x6, and 38 mm Edwards Physio II ring annuloplasty via right mini thoracotomy approach     Past Surgical History:  Procedure Laterality Date  . BREAST ENHANCEMENT SURGERY  2000  . CARDIAC CATHETERIZATION N/A 08/18/2015   Procedure: Right/Left Heart Cath and Coronary Angiography;  Surgeon: Larey Dresser, MD;  Location: [facility];  Service: Cardiovascular;  Laterality: N/A;  . [place]  . HAMMERTOE RECONSTRUCTION WITH WEIL OSTEOTOMY Left 09/09/2016   Procedure: Left 2-4 MT Weil osteotomies; Left 2nd hammertoe correction and extensor tendon lengthing;  Surgeon: Wylene Simmer, MD;  Location: [place];  Service: Orthopedics;  Laterality: Left;  . LIMBAL STEM CELL TRANSPLANT  2008  . MITRAL VALVE REPAIR Right 10/15/2015   Procedure: MINIMALLY INVASIVE MITRAL VALVE  REPAIR (MVR);  Surgeon: Rexene Alberts, MD;  Location: [place];  Service: Open Heart Surgery;  Laterality: Right;  . OOPHORECTOMY  1991   left; got cyst on ovary with torsion when pregnant.  . TEE WITHOUT CARDIOVERSION N/A 09/05/2013   Procedure: TRANSESOPHAGEAL ECHOCARDIOGRAM (TEE);  Surgeon: Larey Dresser, MD;  Location: [place];  Service: Cardiovascular;  Laterality: N/A;  . TEE WITHOUT CARDIOVERSION N/A 08/18/2015   Procedure: TRANSESOPHAGEAL ECHOCARDIOGRAM (TEE);  Surgeon: Larey Dresser, MD;  Location: [place];  Service: Cardiovascular;  Laterality: N/A;  . TEE WITHOUT CARDIOVERSION N/A 10/15/2015   Procedure: TRANSESOPHAGEAL ECHOCARDIOGRAM (TEE);  Surgeon: Rexene Alberts, MD;  Location: [place];  Service: Open Heart Surgery;  Laterality: N/A;  . [place] EXTRACTION  1975    Social History:  reports that she has never smoked. She has never used smokeless tobacco. She reports current alcohol use of about 2.0 standard drinks of alcohol per week. She reports that she does not use drugs.  Allergies: No Known Allergies  Family History:  Family History  Problem Relation Age of Onset  . Non-Hodgkin's lymphoma Mother   . Congestive Heart Failure Mother   . Rheumatic fever Mother        with mitral valve replacment  . Diabetes Mother   . Hypertension Father   . Diabetes Other        family hx  . Hypertension Other        family hx  . Lymphoma Other        family  hx  . Other Maternal Grandmother        died at 54; no known med problems  . Renal cancer Paternal Grandmother   . Colon cancer Neg Hx   . Stomach cancer Neg Hx      Current Outpatient Medications:  .  acyclovir (ZOVIRAX) 200 MG capsule, Take 200 mg by mouth daily as needed (BREAKOUTS). Reported on 12/05/2015, Disp: , Rfl:  .  aspirin 81 MG tablet, Take 81 mg by mouth daily. Reported on 10/08/2015, Disp: , Rfl:  .  calcium carbonate (OS-CAL) 600 MG TABS, Take 600 mg by mouth daily with breakfast. , Disp: ,  Rfl:  .  cetirizine (ZYRTEC) 10 MG tablet, Take 10 mg by mouth daily., Disp: , Rfl:  .  Estrogens, Conjugated (PREMARIN VA), Place 1 application vaginally daily as needed (dryness). , Disp: , Rfl:  .  lisinopril (PRINIVIL,ZESTRIL) 5 MG tablet, Take 1 tablet (5 mg total) by mouth every morning., Disp: 90 tablet, Rfl: 1 .  metoprolol succinate (TOPROL-XL) 25 MG 24 hr tablet, Take 0.5 tablets (12.5 mg total) by mouth daily. Please keep upcoming appt in January with Dr. Meda Coffee. Thank yo, Disp: 45 tablet, Rfl: 1 .  Multiple Vitamins-Minerals (MULTIVITAL PO), Take 1 tablet by mouth daily.  , Disp: , Rfl:  .  sulfamethoxazole-trimethoprim (BACTRIM DS) 800-160 MG tablet, Take 1 tablet by mouth 2 (two) times daily for 7 days., Disp: 14 tablet, Rfl: 0  Review of Systems:  Constitutional: Denies fever, chills, diaphoresis, appetite change and fatigue.  HEENT: Denies photophobia, eye pain, redness, hearing loss, ear pain, congestion, sore throat, rhinorrhea, sneezing, mouth sores, trouble swallowing, neck pain, neck stiffness and tinnitus.   Respiratory: Denies SOB, DOE, cough, chest tightness,  and wheezing.   Cardiovascular: Denies chest pain, palpitations and leg swelling.  Gastrointestinal: Denies nausea, vomiting, abdominal pain, diarrhea, constipation, blood in stool and abdominal distention.  Genitourinary: positive for dysuria, urgency, frequency, hematuria, difficulty urinating.  Endocrine: Denies: hot or cold intolerance, sweats, changes in hair or nails, polyuria, polydipsia. Musculoskeletal: Denies myalgias, back pain, joint swelling, arthralgias and gait problem.  Skin: Denies pallor, rash and wound.  Neurological: Denies dizziness, seizures, syncope, weakness, light-headedness, numbness and headaches.  Hematological: Denies adenopathy. Easy bruising, personal or family bleeding history  Psychiatric/Behavioral: Denies suicidal ideation, mood changes, confusion, nervousness, sleep disturbance and  agitation    Physical Exam: Vitals:   11/30/18 0932  BP: 140/64  Pulse: 88  Temp: 98.4 F (36.9 C)  TempSrc: Oral  SpO2: 98%  Weight: 133 lb 3.2 oz (60.4 kg)    Body mass index is 19.67 kg/m.   Constitutional: NAD, calm, comfortable Eyes: PERRL, lids and conjunctivae normal ENMT: Mucous membranes are moist.  Abdomen: no tenderness, no masses palpated. No hepatosplenomegaly. Bowel sounds positive.  Psychiatric: Normal judgment and insight. Alert and oriented x 3. Normal mood.    Impression and Plan:  Acute cystitis with hematuria  -Dipstick confirms possible UTI with blood and leukocytes. -Bactrim DS for 7 days. -RTC if no improvement in 7 days. -Will send for UA and urine cx.     Patient Instructions  -Hope you feel better soon!  -Take Bactrim DS 1 tablet twice daily for 7 days.  -Return if not better in 1 week.   Urinary Tract Infection, Adult A urinary tract infection (UTI) is an infection of any part of the urinary tract. The urinary tract includes:  The kidneys.  The ureters.  The bladder.  The urethra. These  organs make, store, and get rid of pee (urine) in the body. What are the causes? This is caused by germs (bacteria) in your genital area. These germs grow and cause swelling (inflammation) of your urinary tract. What increases the risk? You are more likely to develop this condition if:  You have a small, thin tube (catheter) to drain pee.  You cannot control when you pee or poop (incontinence).  You are female, and: ? You use these methods to prevent pregnancy: ? A medicine that kills sperm (spermicide). ? A device that blocks sperm (diaphragm). ? You have low levels of a female hormone (estrogen). ? You are pregnant.  You have genes that add to your risk.  You are sexually active.  You take antibiotic medicines.  You have trouble peeing because of: ? A prostate that is bigger than normal, if you are female. ? A blockage in the  part of your body that drains pee from the bladder (urethra). ? A kidney stone. ? A nerve condition that affects your bladder (neurogenic bladder). ? Not getting enough to drink. ? Not peeing often enough.  You have other conditions, such as: ? Diabetes. ? A weak disease-fighting system (immune system). ? Sickle cell disease. ? Gout. ? Injury of the spine. What are the signs or symptoms? Symptoms of this condition include:  Needing to pee right away (urgently).  Peeing often.  Peeing small amounts often.  Pain or burning when peeing.  Blood in the pee.  Pee that smells bad or not like normal.  Trouble peeing.  Pee that is cloudy.  Fluid coming from the vagina, if you are female.  Pain in the belly or lower back. Other symptoms include:  Throwing up (vomiting).  No urge to eat.  Feeling mixed up (confused).  Being tired and grouchy (irritable).  A fever.  Watery poop (diarrhea). How is this treated? This condition may be treated with:  Antibiotic medicine.  Other medicines.  Drinking enough water. Follow these instructions at home:  Medicines  Take over-the-counter and prescription medicines only as told by your doctor.  If you were prescribed an antibiotic medicine, take it as told by your doctor. Do not stop taking it even if you start to feel better. General instructions  Make sure you: ? Pee until your bladder is empty. ? Do not hold pee for a long time. ? Empty your bladder after sex. ? Wipe from front to back after pooping if you are a female. Use each tissue one time when you wipe.  Drink enough fluid to keep your pee pale yellow.  Keep all follow-up visits as told by your doctor. This is important. Contact a doctor if:  You do not get better after 1-2 days.  Your symptoms go away and then come back. Get help right away if:  You have very bad back pain.  You have very bad pain in your lower belly.  You have a fever.  You are  sick to your stomach (nauseous).  You are throwing up. Summary  A urinary tract infection (UTI) is an infection of any part of the urinary tract.  This condition is caused by germs in your genital area.  There are many risk factors for a UTI. These include having a small, thin tube to drain pee and not being able to control when you pee or poop.  Treatment includes antibiotic medicines for germs.  Drink enough fluid to keep your pee pale yellow. This information is  not intended to replace advice given to you by your health care provider. Make sure you discuss any questions you have with your health care provider. Document Released: 11/17/2007 Document Revised: 12/08/2017 Document Reviewed: 12/08/2017 Elsevier Interactive Patient Education  2019 [place] [facility]

## 2018-11-30 NOTE — Addendum Note (Signed)
Addended by: Gwynne Edinger on: 11/30/2018 10:25 AM   Modules accepted: Orders

## 2018-11-30 NOTE — Patient Instructions (Signed)
-Hope you feel better soon!  -Take Bactrim DS 1 tablet twice daily for 7 days.  -Return if not better in 1 week.   Urinary Tract Infection, Adult A urinary tract infection (UTI) is an infection of any part of the urinary tract. The urinary tract includes:  The kidneys.  The ureters.  The bladder.  The urethra. These organs make, store, and get rid of pee (urine) in the body. What are the causes? This is caused by germs (bacteria) in your genital area. These germs grow and cause swelling (inflammation) of your urinary tract. What increases the risk? You are more likely to develop this condition if:  You have a small, thin tube (catheter) to drain pee.  You cannot control when you pee or poop (incontinence).  You are female, and: ? You use these methods to prevent pregnancy: ? A medicine that kills sperm (spermicide). ? A device that blocks sperm (diaphragm). ? You have low levels of a female hormone (estrogen). ? You are pregnant.  You have genes that add to your risk.  You are sexually active.  You take antibiotic medicines.  You have trouble peeing because of: ? A prostate that is bigger than normal, if you are female. ? A blockage in the part of your body that drains pee from the bladder (urethra). ? A kidney stone. ? A nerve condition that affects your bladder (neurogenic bladder). ? Not getting enough to drink. ? Not peeing often enough.  You have other conditions, such as: ? Diabetes. ? A weak disease-fighting system (immune system). ? Sickle cell disease. ? Gout. ? Injury of the spine. What are the signs or symptoms? Symptoms of this condition include:  Needing to pee right away (urgently).  Peeing often.  Peeing small amounts often.  Pain or burning when peeing.  Blood in the pee.  Pee that smells bad or not like normal.  Trouble peeing.  Pee that is cloudy.  Fluid coming from the vagina, if you are female.  Pain in the belly or lower  back. Other symptoms include:  Throwing up (vomiting).  No urge to eat.  Feeling mixed up (confused).  Being tired and grouchy (irritable).  A fever.  Watery poop (diarrhea). How is this treated? This condition may be treated with:  Antibiotic medicine.  Other medicines.  Drinking enough water. Follow these instructions at home:  Medicines  Take over-the-counter and prescription medicines only as told by your doctor.  If you were prescribed an antibiotic medicine, take it as told by your doctor. Do not stop taking it even if you start to feel better. General instructions  Make sure you: ? Pee until your bladder is empty. ? Do not hold pee for a long time. ? Empty your bladder after sex. ? Wipe from front to back after pooping if you are a female. Use each tissue one time when you wipe.  Drink enough fluid to keep your pee pale yellow.  Keep all follow-up visits as told by your doctor. This is important. Contact a doctor if:  You do not get better after 1-2 days.  Your symptoms go away and then come back. Get help right away if:  You have very bad back pain.  You have very bad pain in your lower belly.  You have a fever.  You are sick to your stomach (nauseous).  You are throwing up. Summary  A urinary tract infection (UTI) is an infection of any part of the urinary tract.  This condition is caused by germs in your genital area.  There are many risk factors for a UTI. These include having a small, thin tube to drain pee and not being able to control when you pee or poop.  Treatment includes antibiotic medicines for germs.  Drink enough fluid to keep your pee pale yellow. This information is not intended to replace advice given to you by your health care provider. Make sure you discuss any questions you have with your health care provider. Document Released: 11/17/2007 Document Revised: 12/08/2017 Document Reviewed: 12/08/2017 Elsevier Interactive  Patient Education  2019 [employer].

## 2018-12-02 LAB — URINE CULTURE
MICRO NUMBER:: 584027
SPECIMEN QUALITY:: ADEQUATE

## 2018-12-07 ENCOUNTER — Other Ambulatory Visit: Payer: Self-pay

## 2018-12-07 ENCOUNTER — Inpatient Hospital Stay: Payer: BC Managed Care – PPO

## 2018-12-07 ENCOUNTER — Inpatient Hospital Stay: Payer: BC Managed Care – PPO | Attending: Oncology | Admitting: Oncology

## 2018-12-07 ENCOUNTER — Telehealth: Payer: Self-pay | Admitting: Oncology

## 2018-12-07 VITALS — BP 96/58 | HR 82 | Temp 99.1°F | Resp 18 | Ht 69.0 in | Wt 131.9 lb

## 2018-12-07 DIAGNOSIS — C9001 Multiple myeloma in remission: Secondary | ICD-10-CM

## 2018-12-07 DIAGNOSIS — Z9484 Stem cells transplant status: Secondary | ICD-10-CM | POA: Diagnosis not present

## 2018-12-07 DIAGNOSIS — M8580 Other specified disorders of bone density and structure, unspecified site: Secondary | ICD-10-CM | POA: Insufficient documentation

## 2018-12-07 DIAGNOSIS — N189 Chronic kidney disease, unspecified: Secondary | ICD-10-CM

## 2018-12-07 LAB — CBC WITH DIFFERENTIAL (CANCER CENTER ONLY)
Abs Immature Granulocytes: 0.01 10*3/uL (ref 0.00–0.07)
Basophils Absolute: 0 10*3/uL (ref 0.0–0.1)
Basophils Relative: 1 %
Eosinophils Absolute: 0.1 10*3/uL (ref 0.0–0.5)
Eosinophils Relative: 1 %
HCT: 43.4 % (ref 36.0–46.0)
Hemoglobin: 14.8 g/dL (ref 12.0–15.0)
Immature Granulocytes: 0 %
Lymphocytes Relative: 28 %
Lymphs Abs: 1.4 10*3/uL (ref 0.7–4.0)
MCH: 32.2 pg (ref 26.0–34.0)
MCHC: 34.1 g/dL (ref 30.0–36.0)
MCV: 94.6 fL (ref 80.0–100.0)
Monocytes Absolute: 0.4 10*3/uL (ref 0.1–1.0)
Monocytes Relative: 8 %
Neutro Abs: 3 10*3/uL (ref 1.7–7.7)
Neutrophils Relative %: 62 %
Platelet Count: 189 10*3/uL (ref 150–400)
RBC: 4.59 MIL/uL (ref 3.87–5.11)
RDW: 12.2 % (ref 11.5–15.5)
WBC Count: 4.9 10*3/uL (ref 4.0–10.5)
nRBC: 0 % (ref 0.0–0.2)

## 2018-12-07 LAB — CMP (CANCER CENTER ONLY)
ALT: 18 U/L (ref 0–44)
AST: 27 U/L (ref 15–41)
Albumin: 4.2 g/dL (ref 3.5–5.0)
Alkaline Phosphatase: 63 U/L (ref 38–126)
Anion gap: 8 (ref 5–15)
BUN: 29 mg/dL — ABNORMAL HIGH (ref 6–20)
CO2: 26 mmol/L (ref 22–32)
Calcium: 9 mg/dL (ref 8.9–10.3)
Chloride: 103 mmol/L (ref 98–111)
Creatinine: 1.88 mg/dL — ABNORMAL HIGH (ref 0.44–1.00)
GFR, Est AFR Am: 33 mL/min — ABNORMAL LOW (ref >60)
GFR, Estimated: 29 mL/min — ABNORMAL LOW (ref >60)
Glucose, Bld: 89 mg/dL (ref 70–99)
Potassium: 4.6 mmol/L (ref 3.5–5.1)
Sodium: 137 mmol/L (ref 135–145)
Total Bilirubin: 0.5 mg/dL (ref 0.3–1.2)
Total Protein: 7.1 g/dL (ref 6.5–8.1)

## 2018-12-07 NOTE — Telephone Encounter (Signed)
Gave avs and calendar ° °

## 2018-12-07 NOTE — Progress Notes (Signed)
  [facility] OFFICE PROGRESS NOTE   Diagnosis: Multiple myeloma  INTERVAL HISTORY:   Cristina Garcia returns for a scheduled visit.  She feels well.  She is working.  She had a urinary tract infection last week treated with Bactrim.  The urinary symptoms have improved.  She had symptoms of a viral infection in January.  Objective:  Vital signs in last 24 hours:  Blood pressure (!) 96/58, pulse 82, temperature 99.1 F (37.3 C), temperature source Oral, resp. rate 18, height '5\' 9"'$  (1.753 m), weight 131 lb 14.4 oz (59.8 kg), SpO2 97 %.   Physical examination not performed today secondary to distancing with the COVID pandemic  Lab Results:  Lab Results  Component Value Date   WBC 4.9 12/07/2018   HGB 14.8 12/07/2018   HCT 43.4 12/07/2018   MCV 94.6 12/07/2018   PLT 189 12/07/2018   NEUTROABS 3.0 12/07/2018    CMP  Lab Results  Component Value Date   NA 137 12/07/2018   K 4.6 12/07/2018   CL 103 12/07/2018   CO2 26 12/07/2018   GLUCOSE 89 12/07/2018   BUN 29 (H) 12/07/2018   CREATININE 1.88 (H) 12/07/2018   CALCIUM 9.0 12/07/2018   PROT 7.1 12/07/2018   ALBUMIN 4.2 12/07/2018   AST 27 12/07/2018   ALT 18 12/07/2018   ALKPHOS 63 12/07/2018   BILITOT 0.5 12/07/2018   GFRNONAA 29 (L) 12/07/2018   GFRAA 33 (L) 12/07/2018   Metastatic bone survey 05/05/2018: Stable medial left iliac lesion, no new lesions  Medications: I have reviewed the patient's current medications.   Assessment/Plan: 1. Multiple myeloma diagnosed in November 2007, IgA kappa, status post high-dose chemotherapy with autologous stem cell support at [facility] in June 2008. She remains in clinical remission. 2. history of Renal insufficiency secondary to multiple myeloma, now with chronic renal insufficiency 3. History of hypokalemia. 4. History of left pelvic pain secondary to a lytic left iliac lesion. A metastatic bone survey  04/20/2017 revealed stable lucencies at the left sacral ala and  medial left iliac, questionable new tiny lucency at the superior aspect of the left sacral ala 5. Simple cyst at the lower pole of the right kidney on a renal ultrasound, 11/08/2006. 6. Multiseptated cyst of the left adnexa on an ultrasound, 11/08/2006. 7. Osteopenia 8. Mitral valve prolapse/mitral regurgitation-minimally invasive mitral valve repair 10/15/2015 9. Report of intermittent rectal bleeding. Normal colonoscopy by Dr. Fuller Plan on 08/20/2011.     Disposition: Cristina Garcia is in clinical remission from multiple myeloma.  We will follow-up on the myeloma panel from today.  She will return for an office visit in 6 months.  The creatinine is higher than her baseline today.  I will forward the chemistry panel to Dr. Johnney Ou to see if she would like to repeat the creatinine level prior to her scheduled nephrology appointment.  15 minutes were spent with the patient today.  The majority of the time was used for counseling and coordination of care.  [name], MD  12/07/2018  3:44 PM

## 2018-12-08 ENCOUNTER — Telehealth: Payer: Self-pay | Admitting: *Deleted

## 2018-12-08 LAB — KAPPA/LAMBDA LIGHT CHAINS
Kappa free light chain: 22.5 mg/L — ABNORMAL HIGH (ref 3.3–19.4)
Kappa, lambda light chain ratio: 1.46 (ref 0.26–1.65)
Lambda free light chains: 15.4 mg/L (ref 5.7–26.3)

## 2018-12-08 NOTE — Telephone Encounter (Signed)
-----   Message from Ladell Pier, MD sent at 12/07/2018  4:48 PM EDT ----- Please call patient, please copy labs to Dr. Johnney Ou in nephrology

## 2018-12-08 NOTE — Telephone Encounter (Signed)
TCT patient regarding lab results from 12/07/18. No answer but was able to leave vm message for pt to call back if she wants to review lab results. Labs have been forwarded to Dr. Johnney Ou.

## 2018-12-11 LAB — PROTEIN ELECTROPHORESIS, SERUM
A/G Ratio: 1.9 — ABNORMAL HIGH (ref 0.7–1.7)
Albumin ELP: 4.1 g/dL (ref 2.9–4.4)
Alpha-1-Globulin: 0.2 g/dL (ref 0.0–0.4)
Alpha-2-Globulin: 0.5 g/dL (ref 0.4–1.0)
Beta Globulin: 0.7 g/dL (ref 0.7–1.3)
Gamma Globulin: 0.8 g/dL (ref 0.4–1.8)
Globulin, Total: 2.2 g/dL (ref 2.2–3.9)
Total Protein ELP: 6.3 g/dL (ref 6.0–8.5)

## 2018-12-21 ENCOUNTER — Telehealth: Payer: Self-pay | Admitting: *Deleted

## 2018-12-21 NOTE — Telephone Encounter (Signed)
Noted. Please just monitor symptoms and isolate as instructed. Let us know if worsening of symptoms or if desire to be tested as we can help facilitate this.

## 2018-12-21 NOTE — Telephone Encounter (Signed)
I called the pt and informed her of the message below.  Patient agreed to call back if needed.

## 2018-12-21 NOTE — Telephone Encounter (Signed)
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 Follow local or state Public Health Department (PHD) guidance about staying at home, monitoring symptoms, etc.       Care Advice:    1: HOME CARE:  * You should be able to treat this at home.   2: REASSURANCE AND EDUCATION - EXPOSED, NO SYMPTOMS, LESS THAN 14 DAYS:  * Although you were exposed to COVID-19, you do not currently have any of the common symptoms of COVID-19 infection, such as: cough, fever, and shortness of breath.  * COVID-19 starts within 14 days of exposure.  * Stay at home (quarantine). Do not go to work until 14 days after the exposure.  * MONITOR YOUR SYMPTOMS UNTIL 14 DAYS HAVE PASSED. Check your temperature two times a day.  * Call your healthcare provider if you develop a cough, fever, or shortness of breath. Call your healthcare provider if you develop any other symptoms compatible with a possible diagnosis of COVID-19.   3: COVID-19 - SYMPTOMS:  * COVID-19 most often causes a respiratory illness.  * The most common symptoms are: cough, fever, and shortness of breath.  * Other less common symptoms are: chills, fatigue, headache, loss of smell or taste, muscle pain, and sore throat.  * Some people may have minimal symptoms or even have no symptoms (asymptomatic).      5: MEASURE TEMPERATURE:  * Watch for symptoms of cough and fever.  * Measure your temperature 2 times each day, until 14 days after exposure.  * Report any fever or other concerning symptoms to your healthcare provider.   6: HOME ISOLATION NEEDED IF SYMPTOMS OCCUR:  * Isolation will be needed if you develop symptoms within 14 days of  COVID-19 exposure:  * Isolate yourself at home.  * Do NOT allow any visitors.  * Do NOT go to work or school.  * Do NOT go to religious services, child care centers, shopping, or other public places.   7: NOTE TO TRIAGER - SHOULD THE PERSON GO TO WORK?  * If a person has had CLOSE CONTACT exposure to COVID-19 in the last 14 days, it is recommended that they make plans to work from home until 14 days have passed.  * Similarly, if a person has had travel from or living in [place] (identified by [facility]) it is also recommended that they make plans to work from home.  * The person should TALK TO THE EMPLOYEE HEALTH OFFICE for their workplace.   8: CALL BACK (OR CALL YOUR HEALTHCARE PROVIDER) IF:  * Fever or feeling feverish occurs within 14 days of COVID-19 exposure.  * Cough or difficulty breathing occur within 14 days of COVID-19 exposure.  * Other symptoms of COVID-19 infection occur.  * You have more questions.   9: CARE ADVICE given per Coronavirus   The pt verbalized understanding; she sees Dr Micheline Rough, [facility]; will route to office for noification.

## 2018-12-22 DIAGNOSIS — N183 Chronic kidney disease, stage 3 (moderate): Secondary | ICD-10-CM | POA: Diagnosis not present

## 2019-02-24 ENCOUNTER — Other Ambulatory Visit: Payer: Self-pay | Admitting: Cardiology

## 2019-04-25 ENCOUNTER — Ambulatory Visit (INDEPENDENT_AMBULATORY_CARE_PROVIDER_SITE_OTHER): Payer: BC Managed Care – PPO

## 2019-04-25 ENCOUNTER — Other Ambulatory Visit: Payer: Self-pay

## 2019-04-25 DIAGNOSIS — Z23 Encounter for immunization: Secondary | ICD-10-CM

## 2019-05-13 ENCOUNTER — Encounter: Payer: Self-pay | Admitting: Oncology

## 2019-05-31 ENCOUNTER — Other Ambulatory Visit: Payer: Self-pay | Admitting: Family Medicine

## 2019-05-31 ENCOUNTER — Other Ambulatory Visit: Payer: Self-pay | Admitting: Cardiology

## 2019-05-31 DIAGNOSIS — I10 Essential (primary) hypertension: Secondary | ICD-10-CM

## 2019-06-04 ENCOUNTER — Inpatient Hospital Stay: Payer: BC Managed Care – PPO | Attending: Oncology | Admitting: Oncology

## 2019-06-04 ENCOUNTER — Other Ambulatory Visit: Payer: Self-pay

## 2019-06-04 ENCOUNTER — Telehealth: Payer: Self-pay | Admitting: Oncology

## 2019-06-04 ENCOUNTER — Inpatient Hospital Stay: Payer: BC Managed Care – PPO

## 2019-06-04 VITALS — BP 128/81 | HR 74 | Temp 97.4°F | Resp 16 | Ht 69.0 in | Wt 140.6 lb

## 2019-06-04 DIAGNOSIS — C9000 Multiple myeloma not having achieved remission: Secondary | ICD-10-CM | POA: Insufficient documentation

## 2019-06-04 DIAGNOSIS — N189 Chronic kidney disease, unspecified: Secondary | ICD-10-CM | POA: Diagnosis not present

## 2019-06-04 DIAGNOSIS — R102 Pelvic and perineal pain: Secondary | ICD-10-CM | POA: Diagnosis not present

## 2019-06-04 DIAGNOSIS — C9001 Multiple myeloma in remission: Secondary | ICD-10-CM

## 2019-06-04 DIAGNOSIS — Z23 Encounter for immunization: Secondary | ICD-10-CM | POA: Diagnosis not present

## 2019-06-04 DIAGNOSIS — M8580 Other specified disorders of bone density and structure, unspecified site: Secondary | ICD-10-CM | POA: Diagnosis not present

## 2019-06-04 DIAGNOSIS — M79672 Pain in left foot: Secondary | ICD-10-CM | POA: Diagnosis not present

## 2019-06-04 LAB — CMP (CANCER CENTER ONLY)
ALT: 21 U/L (ref 0–44)
AST: 28 U/L (ref 15–41)
Albumin: 4.1 g/dL (ref 3.5–5.0)
Alkaline Phosphatase: 57 U/L (ref 38–126)
Anion gap: 10 (ref 5–15)
BUN: 21 mg/dL — ABNORMAL HIGH (ref 6–20)
CO2: 29 mmol/L (ref 22–32)
Calcium: 9.9 mg/dL (ref 8.9–10.3)
Chloride: 103 mmol/L (ref 98–111)
Creatinine: 1.12 mg/dL — ABNORMAL HIGH (ref 0.44–1.00)
GFR, Est AFR Am: >60 mL/min (ref >60)
GFR, Estimated: 53 mL/min — ABNORMAL LOW (ref >60)
Glucose, Bld: 88 mg/dL (ref 70–99)
Potassium: 4.2 mmol/L (ref 3.5–5.1)
Sodium: 142 mmol/L (ref 135–145)
Total Bilirubin: 0.7 mg/dL (ref 0.3–1.2)
Total Protein: 7 g/dL (ref 6.5–8.1)

## 2019-06-04 LAB — CBC WITH DIFFERENTIAL (CANCER CENTER ONLY)
Abs Immature Granulocytes: 0.01 10*3/uL (ref 0.00–0.07)
Basophils Absolute: 0 10*3/uL (ref 0.0–0.1)
Basophils Relative: 0 %
Eosinophils Absolute: 0.1 10*3/uL (ref 0.0–0.5)
Eosinophils Relative: 1 %
HCT: 45 % (ref 36.0–46.0)
Hemoglobin: 14.8 g/dL (ref 12.0–15.0)
Immature Granulocytes: 0 %
Lymphocytes Relative: 28 %
Lymphs Abs: 1.5 10*3/uL (ref 0.7–4.0)
MCH: 32.4 pg (ref 26.0–34.0)
MCHC: 32.9 g/dL (ref 30.0–36.0)
MCV: 98.5 fL (ref 80.0–100.0)
Monocytes Absolute: 0.5 10*3/uL (ref 0.1–1.0)
Monocytes Relative: 8 %
Neutro Abs: 3.3 10*3/uL (ref 1.7–7.7)
Neutrophils Relative %: 63 %
Platelet Count: 174 10*3/uL (ref 150–400)
RBC: 4.57 MIL/uL (ref 3.87–5.11)
RDW: 12.1 % (ref 11.5–15.5)
WBC Count: 5.4 10*3/uL (ref 4.0–10.5)
nRBC: 0 % (ref 0.0–0.2)

## 2019-06-04 MED ORDER — PNEUMOCOCCAL VAC POLYVALENT 25 MCG/0.5ML IJ INJ: 0.5000 mL | INJECTION | Freq: Once | INTRAMUSCULAR | Status: DC

## 2019-06-04 NOTE — Progress Notes (Signed)
  [facility] OFFICE PROGRESS NOTE   Diagnosis: Multiple myeloma  INTERVAL HISTORY:   Cristina Garcia returns as scheduled.  She is well.  She was treated for a urinary tract infection in June.  She continues to have pain in the left foot and pelvis.  Change.  No new complaint.  Objective:  Vital signs in last 24 hours:  Blood pressure 128/81, pulse 74, temperature (!) 97.4 F (36.3 C), temperature source Temporal, resp. rate 16, height '5\' 9"'$  (1.753 m), weight 140 lb 9.6 oz (63.8 kg), SpO2 98 %.    Physical examination today secondary to distancing with the Covid pandemic  Lab Results:  Lab Results  Component Value Date   WBC 5.4 06/04/2019   HGB 14.8 06/04/2019   HCT 45.0 06/04/2019   MCV 98.5 06/04/2019   PLT 174 06/04/2019   NEUTROABS 3.3 06/04/2019    CMP  Lab Results  Component Value Date   NA 142 06/04/2019   K 4.2 06/04/2019   CL 103 06/04/2019   CO2 29 06/04/2019   GLUCOSE 88 06/04/2019   BUN 21 (H) 06/04/2019   CREATININE 1.12 (H) 06/04/2019   CALCIUM 9.9 06/04/2019   PROT 7.0 06/04/2019   ALBUMIN 4.1 06/04/2019   AST 28 06/04/2019   ALT 21 06/04/2019   ALKPHOS 57 06/04/2019   BILITOT 0.7 06/04/2019   GFRNONAA 53 (L) 06/04/2019   GFRAA >60 06/04/2019    Medications: I have reviewed the patient's current medications.   Assessment/Plan: 1. Multiple myeloma diagnosed in November 2007, IgA kappa, status post high-dose chemotherapy with autologous stem cell support at [facility] in June 2008. She remains in clinical remission. 2. history of Renal insufficiency secondary to multiple myeloma, now with chronic renal insufficiency 3. History of hypokalemia. 4. History of left pelvic pain secondary to a lytic left iliac lesion. A metastatic bone survey  04/20/2017 revealed stable lucencies at the left sacral ala and medial left iliac, questionable new tiny lucency at the superior aspect of the left sacral ala 5. Simple cyst at the lower pole of the right  kidney on a renal ultrasound, 11/08/2006. 6. Multiseptated cyst of the left adnexa on an ultrasound, 11/08/2006. 7. Osteopenia 8. Mitral valve prolapse/mitral regurgitation-minimally invasive mitral valve repair 10/15/2015 9. Report of intermittent rectal bleeding. Normal colonoscopy by Dr. Fuller Plan on 08/20/2011.  Disposition: [name] appears stable.  The creatinine is better today.  We will follow-up on the myeloma panel from today.  She will be scheduled for a metastatic bone survey in 3 months.  She will return for an office visit in 6 months.  She will be due for a 23 valent pneumococcal vaccine when she returns in 6 months.  [name], MD  06/04/2019  3:22 PM

## 2019-06-04 NOTE — Telephone Encounter (Signed)
Scheduled per los. Gave avs and calendar  

## 2019-06-05 LAB — PROTEIN ELECTROPHORESIS, SERUM
A/G Ratio: 1.7 (ref 0.7–1.7)
Albumin ELP: 4.1 g/dL (ref 2.9–4.4)
Alpha-1-Globulin: 0.2 g/dL (ref 0.0–0.4)
Alpha-2-Globulin: 0.6 g/dL (ref 0.4–1.0)
Beta Globulin: 0.8 g/dL (ref 0.7–1.3)
Gamma Globulin: 0.8 g/dL (ref 0.4–1.8)
Globulin, Total: 2.4 g/dL (ref 2.2–3.9)
Total Protein ELP: 6.5 g/dL (ref 6.0–8.5)

## 2019-06-05 LAB — KAPPA/LAMBDA LIGHT CHAINS
Kappa free light chain: 17.3 mg/L (ref 3.3–19.4)
Kappa, lambda light chain ratio: 1.34 (ref 0.26–1.65)
Lambda free light chains: 12.9 mg/L (ref 5.7–26.3)

## 2019-06-25 ENCOUNTER — Other Ambulatory Visit: Payer: Self-pay

## 2019-06-25 ENCOUNTER — Ambulatory Visit (HOSPITAL_COMMUNITY): Payer: BC Managed Care – PPO | Attending: Cardiovascular Disease

## 2019-06-25 DIAGNOSIS — Z9889 Other specified postprocedural states: Secondary | ICD-10-CM | POA: Insufficient documentation

## 2019-06-25 DIAGNOSIS — I340 Nonrheumatic mitral (valve) insufficiency: Secondary | ICD-10-CM | POA: Insufficient documentation

## 2019-06-25 DIAGNOSIS — I10 Essential (primary) hypertension: Secondary | ICD-10-CM

## 2019-06-27 NOTE — Progress Notes (Signed)
Cardiology Office Note    Date:  07/03/2019   ID:  Cristina Garcia, DOB [birth year], MRN [id number]  PCP:  Caren Macadam, MD  Cardiologist: Ena Dawley, MD EPS: None  No chief complaint on file.   History of Present Illness:  Cristina Garcia is a 59 y.o. female with history of severe MR, normal Coronaries on cath, S/P minimally invasive MV repair 2017. Post op required right thoracentesis. EF post op 11/2015 EF 45%, mild L dilation, stable MV repair, f/u echo 05/2018 LVEF 50-55%.  Patient last saw Dr. Meda Coffee 06/22/18 and recommended yearly echo.  Patient comes in for yearly f/u. Echo 06/25/19 no change. Mean Gradient 6 mmhg. Exercising 30 min daily, walks, bikes.  Denies chest pain, palpitations, dyspnea, dypsnea on exertion, dizziness or presyncope.  Past Medical History:  Diagnosis Date  . Allergy   . Anxiety   . Atrial tachycardia ([place]) 12/18/2014   24 hour event monitor with frequent PAC's, PVC's and short runs of atrial tachycardia - no sustained arrhythmias or atrial fibrillation   . Chronic kidney disease (CKD)   . Constipation   . H/O multiple myeloma    clinical remission  . Hypertension   . Mitral regurgitation   . Mitral valve prolapse   . Osteoporosis   . Pleural effusion, right 11/03/2015  . S/P minimally invasive mitral valve repair 10/15/2015   Complex valvuloplasty including Alfieri edge-to-edge repair, generous quadrangular resection (shortening) of posterior leaflet, sliding leaflet plasty, Gore-tex neochord placement x6, and 38 mm Edwards Physio II ring annuloplasty via right mini thoracotomy approach     Past Surgical History:  Procedure Laterality Date  . BREAST ENHANCEMENT SURGERY  2000  . CARDIAC CATHETERIZATION N/A 08/18/2015   Procedure: Right/Left Heart Cath and Coronary Angiography;  Surgeon: Larey Dresser, MD;  Location: [place] [facility];  Service: Cardiovascular;  Laterality: N/A;  . [place]  . HAMMERTOE RECONSTRUCTION WITH  WEIL OSTEOTOMY Left 09/09/2016   Procedure: Left 2-4 MT Weil osteotomies; Left 2nd hammertoe correction and extensor tendon lengthing;  Surgeon: Wylene Simmer, MD;  Location: [place];  Service: Orthopedics;  Laterality: Left;  . LIMBAL STEM CELL TRANSPLANT  2008  . MITRAL VALVE REPAIR Right 10/15/2015   Procedure: MINIMALLY INVASIVE MITRAL VALVE REPAIR (MVR);  Surgeon: Rexene Alberts, MD;  Location: [place] [facility];  Service: Open Heart Surgery;  Laterality: Right;  . OOPHORECTOMY  1991   left; got cyst on ovary with torsion when pregnant.  . TEE WITHOUT CARDIOVERSION N/A 09/05/2013   Procedure: TRANSESOPHAGEAL ECHOCARDIOGRAM (TEE);  Surgeon: Larey Dresser, MD;  Location: [place];  Service: Cardiovascular;  Laterality: N/A;  . TEE WITHOUT CARDIOVERSION N/A 08/18/2015   Procedure: TRANSESOPHAGEAL ECHOCARDIOGRAM (TEE);  Surgeon: Larey Dresser, MD;  Location: [place];  Service: Cardiovascular;  Laterality: N/A;  . TEE WITHOUT CARDIOVERSION N/A 10/15/2015   Procedure: TRANSESOPHAGEAL ECHOCARDIOGRAM (TEE);  Surgeon: Rexene Alberts, MD;  Location: [facility];  Service: Open Heart Surgery;  Laterality: N/A;  . WISDOM TOOTH EXTRACTION  1975    Current Medications: No outpatient medications have been marked as taking for the 07/03/19 encounter (Office Visit) with Imogene Burn, PA-C.     Allergies:   Patient has no known allergies.   Social History   Socioeconomic History  . Marital status: Married    Spouse name: Not on file  . Number of children: Not on file  . Years of education: Not on file  .  Highest education level: Not on file  Occupational History  . Not on file  Tobacco Use  . Smoking status: Never Smoker  . Smokeless tobacco: Never Used  Substance and Sexual Activity  . Alcohol use: Yes    Alcohol/week: 2.0 standard drinks    Types: 2 Glasses of wine per week    Comment: socially  . Drug use: No  . Sexual activity: Not on file  Other Topics Concern  . Not on  file  Social History Narrative  . Not on file   Social Determinants of Health   Financial Resource Strain:   . Difficulty of Paying Living Expenses: Not on file  Food Insecurity:   . Worried About Charity fundraiser in the Last Year: Not on file  . Ran Out of Food in the Last Year: Not on file  Transportation Needs:   . Lack of Transportation (Medical): Not on file  . Lack of Transportation (Non-Medical): Not on file  Physical Activity:   . Days of Exercise per Week: Not on file  . Minutes of Exercise per Session: Not on file  Stress:   . Feeling of Stress : Not on file  Social Connections:   . Frequency of Communication with Friends and Family: Not on file  . Frequency of Social Gatherings with Friends and Family: Not on file  . Attends Religious Services: Not on file  . Active Member of Clubs or Organizations: Not on file  . Attends Archivist Meetings: Not on file  . Marital Status: Not on file     Family History:  The patient's   family history includes Congestive Heart Failure in her mother; Diabetes in her mother and another family member; Hypertension in her father and another family member; Lymphoma in an other family member; Non-Hodgkin's lymphoma in her mother; Other in her maternal grandmother; Renal cancer in her paternal grandmother; Rheumatic fever in her mother.   ROS:   Please see the history of present illness.    ROS All other systems reviewed and are negative.   PHYSICAL EXAM:   VS:  BP 110/64   Pulse 73   Ht '5\' 9"'  (1.753 m)   Wt 138 lb (62.6 kg)   SpO2 98%   BMI 20.38 kg/m   Physical Exam  GEN: Thin, in no acute distress  Neck: no JVD, carotid bruits, or masses Cardiac:RRR; 1/6 systolic murmur apex Respiratory:  clear to auscultation bilaterally, normal work of breathing GI: soft, nontender, nondistended, + BS Ext: without cyanosis, clubbing, or edema, Good distal pulses bilaterally Neuro:  Alert and Oriented x 3 Psych: euthymic mood,  full affect  Wt Readings from Last 3 Encounters:  07/03/19 138 lb (62.6 kg)  06/04/19 140 lb 9.6 oz (63.8 kg)  12/07/18 131 lb 14.4 oz (59.8 kg)      Studies/Labs Reviewed:   EKG:  EKG is  ordered today.  The ekg ordered today demonstrates NSR biatrial enlargement no change  Recent Labs: 06/04/2019: ALT 21; BUN 21; Creatinine 1.12; Hemoglobin 14.8; Platelet Count 174; Potassium 4.2; Sodium 142   Lipid Panel    Component Value Date/Time   CHOL 245 (H) 06/18/2014 0856   TRIG 69.0 06/18/2014 0856   HDL 90.40 06/18/2014 0856   CHOLHDL 3 06/18/2014 0856   VLDL 13.8 06/18/2014 0856   LDLCALC 141 (H) 06/18/2014 0856   LDLDIRECT 104.4 08/13/2010 0835    Additional studies/ records that were reviewed today include:  Echo 06/25/19  IMPRESSIONS      1. Left ventricular ejection fraction, by visual estimation, is 55 to 60%. The left ventricle has normal function. There is no left ventricular hypertrophy.  2. Abnormal septal motion consistent with post-operative status.  3. Left ventricular diastolic function could not be evaluated.  4. The left ventricle has no regional wall motion abnormalities.  5. Global right ventricle has normal systolic function.The right ventricular size is normal. No increase in right ventricular wall thickness.  6. Left atrial size was mildly dilated.  7. The mitral valve has been repaired/replaced. Mild mitral valve regurgitation. Mild-moderate mitral stenosis.  8. S/p Alfieri repair and 38 mm annuloplasty ring 10/15/2015. Mild to moderate mitral stenosis, mean gradient 6 mmHG, EOA 2.2 cm2 at heart rate 76 bpm.  9. The tricuspid valve is grossly normal. 10. The aortic valve is tricuspid. Aortic valve regurgitation is not visualized. No evidence of aortic valve sclerosis or stenosis. 11. Normal pulmonary artery systolic pressure. 12. The tricuspid regurgitant velocity is 2.14 m/s, and with an assumed right atrial pressure of 3 mmHg, the estimated right  ventricular systolic pressure is normal at 21.2 mmHg. 13. The inferior vena cava is normal in size with greater than 50% respiratory variability, suggesting right atrial pressure of 3 mmHg.   In comparison to the previous echocardiogram(s): No significant change compared with 06/12/2018   TTE: 03/30/2017 - Left ventricle: The cavity size was normal. Systolic function was   mildly reduced. The estimated ejection fraction was in the range   of 45% to 50%. Mild hypokinesis of the anteroseptal and   inferoseptal myocardium. - Ventricular septum: Septal motion showed &quot;bounce&quot;. - Aortic valve: Transvalvular velocity was within the normal range.   There was no stenosis. There was no regurgitation. - Mitral valve: Prior procedures included surgical repair. An   annular ring prosthesis was present. There was mild   regurgitation. Mitral valve inflow gradient is moderately   elevated. Pressure half-time: 147 ms. Mean gradient (D): 8 mm Hg.   Valve area by pressure half-time: 1.5 cm^2. Valve area by   continuity equation (using LVOT flow): 0.71 cm^2. - Left atrium: The atrium was mildly to moderately dilated. - Right ventricle: The cavity size was normal. Wall thickness was   normal. - Tricuspid valve: There was mild regurgitation. - Pulmonary arteries: Systolic pressure was within the normal   range. PA peak pressure: 32 mm Hg (S).   Impressions:   - Compared with the echo 11/2015, the mitral valve pressure   half-time has increased from 68 ms to 147 ms, which correlates   with a decrease in the calculated mitral valve area from 3.24   cm^2 to 1.5 cm^2. However, the mean gradient is unchanged at 8   mmHg. Overall valve function is stable. The previously noted   doming of the anterior mitral valve leaflet is still present but   less prominent. The left atrium is now much smaller.   TTE: 05/2018 - Left ventricle: The cavity size was normal. Wall thickness was   normal. Systolic  function was normal. The estimated ejection   fraction was in the range of 50% to 55%. Wall motion was normal;   there were no regional wall motion abnormalities. The study is   not technically sufficient to allow evaluation of LV diastolic   function. Doppler parameters are consistent with high ventricular   filling pressure. - Mitral valve: Calcified annulus. Moderately thickened leaflets .   Prior procedures included surgical repair. The findings  are   consistent with moderate stenosis. There was mild to moderate   regurgitation. - Pulmonary arteries: Systolic pressure was mildly increased.   Impressions: - Normal LV systolic function; elevated LV filling pressure; s/p MV   repair with probable moderate MS (mean gradient 8 mmHg; MVA by   pressure half time 1.9 cm2); mild to moderate MR; mild TR with   mild pulmonary hyptertension.     ASSESSMENT:    1. S/P minimally invasive mitral valve repair   2. Essential hypertension   3. Mixed hyperlipidemia      PLAN:  In order of problems listed above:  Status post minimally invasive mitral valve repair 2017.  Echo 06/2019  normal LV function with transmitral gradient 6 mmHg, . Needs yearly echos. Doing well without symptoms and exercises daily.  HTN BP controlled on lisinopril and toprol  Hyperlipidemia- hasn't had cholesterol checked since 2016. Not fasting today so she'll have PCP check it.      Medication Adjustments/Labs and Tests Ordered: Current medicines are reviewed at length with the patient today.  Concerns regarding medicines are outlined above.  Medication changes, Labs and Tests ordered today are listed in the Patient Instructions below. Patient Instructions  Your physician recommends that you continue on your current medications as directed. Please refer to the Current Medication list given to you today.   Your physician wants you to follow-up in: [place]  ECHO PRIOR You will receive a reminder  letter in the mail two months in advance. If you don't receive a letter, please call our office to schedule the follow-up appointment.   Your physician has requested that you have an echocardiogram. Echocardiography is a painless test that uses sound waves to create images of your heart. It provides your doctor with information about the size and shape of your heart and how well your heart's chambers and valves are working. This procedure takes approximately one hour. There are no restrictions for this procedure.  DUE IN A YEAR     Signed, Ermalinda Barrios, PA-C  07/03/2019 9:06 AM    [facility] [place], [place], [place]  [address] Phone: [phone]; Fax: [phone]

## 2019-07-03 ENCOUNTER — Other Ambulatory Visit: Payer: Self-pay

## 2019-07-03 ENCOUNTER — Encounter: Payer: Self-pay | Admitting: Physician Assistant

## 2019-07-03 ENCOUNTER — Other Ambulatory Visit: Payer: Self-pay | Admitting: *Deleted

## 2019-07-03 ENCOUNTER — Ambulatory Visit: Payer: BC Managed Care – PPO | Admitting: Physician Assistant

## 2019-07-03 VITALS — BP 110/64 | HR 73 | Ht 69.0 in | Wt 138.0 lb

## 2019-07-03 DIAGNOSIS — Z9889 Other specified postprocedural states: Secondary | ICD-10-CM

## 2019-07-03 DIAGNOSIS — E782 Mixed hyperlipidemia: Secondary | ICD-10-CM | POA: Diagnosis not present

## 2019-07-03 DIAGNOSIS — I10 Essential (primary) hypertension: Secondary | ICD-10-CM

## 2019-07-03 NOTE — Patient Instructions (Addendum)
Your physician recommends that you continue on your current medications as directed. Please refer to the Current Medication list given to you today.   Your physician wants you to follow-up in: [place]  ECHO PRIOR You will receive a reminder letter in the mail two months in advance. If you don't receive a letter, please call our office to schedule the follow-up appointment.   Your physician has requested that you have an echocardiogram. Echocardiography is a painless test that uses sound waves to create images of your heart. It provides your doctor with information about the size and shape of your heart and how well your heart's chambers and valves are working. This procedure takes approximately one hour. There are no restrictions for this procedure.  DUE IN A YEAR

## 2019-08-07 DIAGNOSIS — N183 Chronic kidney disease, stage 3 unspecified: Secondary | ICD-10-CM | POA: Diagnosis not present

## 2019-08-07 DIAGNOSIS — Z8579 Personal history of other malignant neoplasms of lymphoid, hematopoietic and related tissues: Secondary | ICD-10-CM | POA: Diagnosis not present

## 2019-08-07 DIAGNOSIS — I10 Essential (primary) hypertension: Secondary | ICD-10-CM | POA: Diagnosis not present

## 2019-08-19 ENCOUNTER — Encounter: Payer: Self-pay | Admitting: Oncology

## 2019-08-22 ENCOUNTER — Other Ambulatory Visit: Payer: Self-pay | Admitting: Family Medicine

## 2019-08-22 ENCOUNTER — Other Ambulatory Visit: Payer: Self-pay | Admitting: Cardiology

## 2019-08-22 DIAGNOSIS — I10 Essential (primary) hypertension: Secondary | ICD-10-CM

## 2019-09-16 ENCOUNTER — Other Ambulatory Visit: Payer: Self-pay | Admitting: Family Medicine

## 2019-09-16 DIAGNOSIS — I10 Essential (primary) hypertension: Secondary | ICD-10-CM

## 2019-10-22 ENCOUNTER — Other Ambulatory Visit: Payer: Self-pay | Admitting: Family Medicine

## 2019-10-22 DIAGNOSIS — I10 Essential (primary) hypertension: Secondary | ICD-10-CM

## 2019-11-06 ENCOUNTER — Encounter: Payer: Self-pay | Admitting: Oncology

## 2019-11-18 ENCOUNTER — Other Ambulatory Visit: Payer: Self-pay | Admitting: Family Medicine

## 2019-11-18 DIAGNOSIS — I10 Essential (primary) hypertension: Secondary | ICD-10-CM

## 2019-11-24 ENCOUNTER — Ambulatory Visit (HOSPITAL_BASED_OUTPATIENT_CLINIC_OR_DEPARTMENT_OTHER)
Admission: RE | Admit: 2019-11-24 | Discharge: 2019-11-24 | Disposition: A | Discharge status: Home / Self Care | Payer: BC Managed Care – PPO | Source: Ambulatory Visit | Attending: Oncology | Admitting: Oncology

## 2019-11-24 ENCOUNTER — Other Ambulatory Visit: Payer: Self-pay

## 2019-11-24 DIAGNOSIS — C9000 Multiple myeloma not having achieved remission: Secondary | ICD-10-CM | POA: Diagnosis not present

## 2019-11-24 DIAGNOSIS — C9001 Multiple myeloma in remission: Secondary | ICD-10-CM | POA: Diagnosis not present

## 2019-12-04 ENCOUNTER — Inpatient Hospital Stay: Payer: BC Managed Care – PPO | Attending: Oncology | Admitting: Oncology

## 2019-12-04 ENCOUNTER — Inpatient Hospital Stay: Payer: BC Managed Care – PPO

## 2019-12-04 ENCOUNTER — Other Ambulatory Visit: Payer: Self-pay

## 2019-12-04 VITALS — BP 95/73 | HR 72 | Temp 97.3°F | Resp 15 | Ht 69.0 in | Wt 136.1 lb

## 2019-12-04 DIAGNOSIS — C9001 Multiple myeloma in remission: Secondary | ICD-10-CM | POA: Diagnosis not present

## 2019-12-04 DIAGNOSIS — Z23 Encounter for immunization: Secondary | ICD-10-CM | POA: Insufficient documentation

## 2019-12-04 DIAGNOSIS — C9000 Multiple myeloma not having achieved remission: Secondary | ICD-10-CM | POA: Insufficient documentation

## 2019-12-04 DIAGNOSIS — M8580 Other specified disorders of bone density and structure, unspecified site: Secondary | ICD-10-CM | POA: Diagnosis not present

## 2019-12-04 DIAGNOSIS — N189 Chronic kidney disease, unspecified: Secondary | ICD-10-CM | POA: Insufficient documentation

## 2019-12-04 DIAGNOSIS — G43909 Migraine, unspecified, not intractable, without status migrainosus: Secondary | ICD-10-CM | POA: Diagnosis not present

## 2019-12-04 LAB — CMP (CANCER CENTER ONLY)
ALT: 21 U/L (ref 0–44)
AST: 21 U/L (ref 15–41)
Albumin: 4.1 g/dL (ref 3.5–5.0)
Alkaline Phosphatase: 64 U/L (ref 38–126)
Anion gap: 10 (ref 5–15)
BUN: 22 mg/dL — ABNORMAL HIGH (ref 6–20)
CO2: 30 mmol/L (ref 22–32)
Calcium: 10.1 mg/dL (ref 8.9–10.3)
Chloride: 99 mmol/L (ref 98–111)
Creatinine: 1.5 mg/dL — ABNORMAL HIGH (ref 0.44–1.00)
GFR, Est AFR Am: 43 mL/min — ABNORMAL LOW (ref >60)
GFR, Estimated: 37 mL/min — ABNORMAL LOW (ref >60)
Glucose, Bld: 87 mg/dL (ref 70–99)
Potassium: 4.1 mmol/L (ref 3.5–5.1)
Sodium: 139 mmol/L (ref 135–145)
Total Bilirubin: 0.8 mg/dL (ref 0.3–1.2)
Total Protein: 6.8 g/dL (ref 6.5–8.1)

## 2019-12-04 LAB — CBC WITH DIFFERENTIAL (CANCER CENTER ONLY)
Abs Immature Granulocytes: 0.01 10*3/uL (ref 0.00–0.07)
Basophils Absolute: 0 10*3/uL (ref 0.0–0.1)
Basophils Relative: 1 %
Eosinophils Absolute: 0.1 10*3/uL (ref 0.0–0.5)
Eosinophils Relative: 1 %
HCT: 45.6 % (ref 36.0–46.0)
Hemoglobin: 15.2 g/dL — ABNORMAL HIGH (ref 12.0–15.0)
Immature Granulocytes: 0 %
Lymphocytes Relative: 30 %
Lymphs Abs: 1.4 10*3/uL (ref 0.7–4.0)
MCH: 32.3 pg (ref 26.0–34.0)
MCHC: 33.3 g/dL (ref 30.0–36.0)
MCV: 97 fL (ref 80.0–100.0)
Monocytes Absolute: 0.4 10*3/uL (ref 0.1–1.0)
Monocytes Relative: 9 %
Neutro Abs: 2.8 10*3/uL (ref 1.7–7.7)
Neutrophils Relative %: 59 %
Platelet Count: 178 10*3/uL (ref 150–400)
RBC: 4.7 MIL/uL (ref 3.87–5.11)
RDW: 12.2 % (ref 11.5–15.5)
WBC Count: 4.7 10*3/uL (ref 4.0–10.5)
nRBC: 0 % (ref 0.0–0.2)

## 2019-12-04 MED ORDER — PNEUMOCOCCAL VAC POLYVALENT 25 MCG/0.5ML IJ INJ
0.5000 mL | INJECTION | Freq: Once | INTRAMUSCULAR | Status: AC
  Administered 2019-12-04: 0.5 mL via INTRAMUSCULAR
  Filled 2019-12-04: qty 0.5

## 2019-12-04 NOTE — Progress Notes (Signed)
  [place] OFFICE PROGRESS NOTE   Diagnosis: Multiple myeloma  INTERVAL HISTORY:   Cristina Garcia returns as scheduled.  She generally feels well.  No recent infection.  No new site of pain.  She has noted increased migraine headaches.  She thinks this may be related to stress at work.  Objective:  Vital signs in last 24 hours:  Blood pressure 95/73, pulse 72, temperature (!) 97.3 F (36.3 C), temperature source Temporal, resp. rate 15, height _0  (1.753 m), weight 136 lb 1.6 oz (61.7 kg), SpO2 100 %.   Resp: Lungs clear bilaterally Cardio: Regular rate and rhythm GI: No hepatosplenomegaly Vascular: No leg edema Musculoskeletal: Mild tenderness at the left posterior iliac    Lab Results:  Lab Results  Component Value Date   WBC 4.7 12/04/2019   HGB 15.2 (H) 12/04/2019   HCT 45.6 12/04/2019   MCV 97.0 12/04/2019   PLT 178 12/04/2019   NEUTROABS 2.8 12/04/2019    CMP  Lab Results  Component Value Date   NA 142 06/04/2019   K 4.2 06/04/2019   CL 103 06/04/2019   CO2 29 06/04/2019   GLUCOSE 88 06/04/2019   BUN 21 (H) 06/04/2019   CREATININE 1.12 (H) 06/04/2019   CALCIUM 9.9 06/04/2019   PROT 7.0 06/04/2019   ALBUMIN 4.1 06/04/2019   AST 28 06/04/2019   ALT 21 06/04/2019   ALKPHOS 57 06/04/2019   BILITOT 0.7 06/04/2019   GFRNONAA 53 (L) 06/04/2019   GFRAA >60 06/04/2019     Imaging: Metastatic bone survey 11/24/2019-stable left iliac lesion, no new osseous lesion   Medications: I have reviewed the patient's current medications.   Assessment/Plan: 1. Multiple myeloma diagnosed in November 2007, IgA kappa, status post high-dose chemotherapy with autologous stem cell support at [facility] in June 2008. She remains in clinical remission. 2. history of Renal insufficiency secondary to multiple myeloma, now with chronic renal insufficiency 3. History of hypokalemia. 4. History of left pelvic pain secondary to a lytic left iliac lesion. A metastatic  bone survey  04/20/2017 revealed stable lucencies at the left sacral ala and medial left iliac, questionable new tiny lucency at the superior aspect of the left sacral ala 5. Simple cyst at the lower pole of the right kidney on a renal ultrasound, 11/08/2006. 6. Multiseptated cyst of the left adnexa on an ultrasound, 11/08/2006. 7. Osteopenia 8. Mitral valve prolapse/mitral regurgitation-minimally invasive mitral valve repair 10/15/2015 9. Report of intermittent rectal bleeding. Normal colonoscopy by Dr. Fuller Plan on 08/20/2011.    Disposition: Cristina Garcia appears stable.  We will follow up on the myeloma panel from today.  There is no clinical evidence of recurrent myeloma.  She has received the COVID-19 vaccine.  She received a 23 valent pneumococcal vaccine today.  She will remain up-to-date on the influenza vaccine.  Cristina Garcia will return for an office and lab visit in 6 months.  [name], MD  12/04/2019  3:24 PM

## 2019-12-05 ENCOUNTER — Encounter: Payer: Self-pay | Admitting: *Deleted

## 2019-12-05 LAB — IGA: IgA: 104 mg/dL (ref 87–352)

## 2019-12-05 NOTE — Progress Notes (Signed)
Labs of 12/04/19 faxed to nephrologist, Dr. Jannifer Hick at [facility] per Dr. Gearldine Shown request. [phone]

## 2019-12-07 ENCOUNTER — Telehealth: Payer: Self-pay | Admitting: *Deleted

## 2019-12-07 LAB — KAPPA/LAMBDA LIGHT CHAINS
Kappa free light chain: 20.1 mg/L — ABNORMAL HIGH (ref 3.3–19.4)
Kappa, lambda light chain ratio: 1.51 (ref 0.26–1.65)
Lambda free light chains: 13.3 mg/L (ref 5.7–26.3)

## 2019-12-07 NOTE — Telephone Encounter (Signed)
-----   Message from Cristina Pier, MD sent at 12/07/2019  2:27 PM EDT ----- Please call patient, myeloma labs are stable, follow-up as scheduledFollow-up with nephrology for the elevated creatinine

## 2019-12-07 NOTE — Telephone Encounter (Signed)
Notified of stable myeloma labs and to f/u with nephrology re: elevated creatinine. These labs were already faxed to [facility].

## 2019-12-16 ENCOUNTER — Other Ambulatory Visit: Payer: Self-pay | Admitting: Family Medicine

## 2019-12-16 DIAGNOSIS — I10 Essential (primary) hypertension: Secondary | ICD-10-CM

## 2020-01-21 ENCOUNTER — Other Ambulatory Visit: Payer: Self-pay | Admitting: Family Medicine

## 2020-01-21 DIAGNOSIS — I10 Essential (primary) hypertension: Secondary | ICD-10-CM

## 2020-02-08 ENCOUNTER — Other Ambulatory Visit: Payer: Self-pay | Admitting: Family Medicine

## 2020-02-08 DIAGNOSIS — I10 Essential (primary) hypertension: Secondary | ICD-10-CM

## 2020-02-11 ENCOUNTER — Encounter: Payer: Self-pay | Admitting: Family Medicine

## 2020-02-11 DIAGNOSIS — I10 Essential (primary) hypertension: Secondary | ICD-10-CM

## 2020-02-12 MED ORDER — LISINOPRIL 5 MG PO TABS: ORAL_TABLET | ORAL | 0 refills | Status: DC

## 2020-02-12 NOTE — Telephone Encounter (Signed)
I called the pt and informed her she should have a visit prior to December.  I scheduled a virtual appt for 9/8 at 1:30pm and the patient is aware a 30-day supply for Lisinopril was sent to [facility].

## 2020-02-20 ENCOUNTER — Encounter: Payer: Self-pay | Admitting: Family Medicine

## 2020-02-20 ENCOUNTER — Telehealth (INDEPENDENT_AMBULATORY_CARE_PROVIDER_SITE_OTHER): Payer: BC Managed Care – PPO | Admitting: Family Medicine

## 2020-02-20 DIAGNOSIS — C9001 Multiple myeloma in remission: Secondary | ICD-10-CM

## 2020-02-20 DIAGNOSIS — N189 Chronic kidney disease, unspecified: Secondary | ICD-10-CM

## 2020-02-20 DIAGNOSIS — I10 Essential (primary) hypertension: Secondary | ICD-10-CM

## 2020-02-20 DIAGNOSIS — Z13220 Encounter for screening for lipoid disorders: Secondary | ICD-10-CM

## 2020-02-20 MED ORDER — LISINOPRIL 5 MG PO TABS: ORAL_TABLET | ORAL | 1 refills | Status: DC

## 2020-02-20 NOTE — Progress Notes (Signed)
Virtual Visit via Video Note  I connected with Cristina Garcia  on 02/20/20 at  1:30 PM EDT by a video enabled telemedicine application and verified that I am speaking with the correct person using two identifiers.  Location patient: home Location provider: [place], [place] [address] Persons participating in the virtual visit: patient, provider  I discussed the limitations of evaluation and management by telemedicine and the availability of in person appointments. The patient expressed understanding and agreed to proceed.   Cristina Garcia DOB: 02-10-1961 Encounter date: 02/20/2020  This is a 59 y.o. female who presents with Chief Complaint  Patient presents with  . Follow-up    History of present illness: Established care 09/2018. Booked appointment to come in December.  States that overall she is doing pretty well. Did get COVID vaccination. 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   Feels like she is stuffy all the time. Not sure if related to wearing mask all the time.   HTN: on lisinopril 34m daily. Has been doing fine. Takes the lisinopril in morning and toprol at night. Does well with both of these and will have some lower readings, but states she doesn't drop too low (low 117/75).   MVP s/p repair in 2017:follows with KEna Dawley- last visit 06/2018 and reports great exercise tolerance.  Osteoporosis: dx tied in with the myeloma. On oscal daily.  ?mammogram: states will do after budget season. Not going to do now.   ?lipids - hasn't been checked for awhile. She does have appointment to get bloodwork done before she sees me.    CKD: slightly elevated creat; [facility]. Function has been stable.   Multiple myeloma in remission: follows with GBetsy Coder gets bloodwork q 6 months.   Anxiety: walking some, plans to go to the beach this weekend. Feels like she  is doing better not over-worrying. Able to push some worrying aside and not dwell.     No Known Allergies Current Meds  Medication Sig  . acyclovir (ZOVIRAX) 200 MG capsule Take 200 mg by mouth daily as needed (BREAKOUTS). Reported on 12/05/2015  . aspirin 81 MG tablet Take 81 mg by mouth daily. Reported on 10/08/2015  . calcium carbonate (OS-CAL) 600 MG TABS Take 600 mg by mouth daily with breakfast.   . cetirizine (ZYRTEC) 10 MG tablet Take 10 mg by mouth daily.  . Estrogens, Conjugated (PREMARIN VA) Place 1 application vaginally daily as needed (dryness).   .Marland Kitchenlisinopril (ZESTRIL) 5 MG tablet TAKE 1 TABLET BY MOUTH EVERY DAY IN THE MORNING  . metoprolol succinate (TOPROL-XL) 25 MG 24 hr tablet Take 0.5 tablets (12.5 mg total) by mouth daily.  . Multiple Vitamins-Minerals (MULTIVITAL PO) Take 1 tablet by mouth daily.      Review of Systems  Constitutional: Negative for chills, fatigue and fever.  HENT: Positive for congestion (mild).   Respiratory: Negative for cough, chest tightness, shortness of breath and wheezing.   Cardiovascular: Negative for chest pain, palpitations and leg swelling.    Objective:  There were no vitals taken for this visit.      BP Readings from Last 3 Encounters:  12/04/19 95/73  07/03/19 110/64  06/04/19 128/81   Wt Readings from Last 3 Encounters:  12/04/19 136 lb 1.6 oz (61.7 kg)  07/03/19 138 lb (62.6 kg)  06/04/19 140 lb 9.6 oz (63.8 kg)    EXAM:  GENERAL: alert, oriented, appears well and in no acute distress  HEENT: atraumatic, conjunctiva clear, no obvious abnormalities on inspection of external nose and ears  NECK: normal movements of the head and neck  LUNGS: on inspection no signs of respiratory distress, breathing rate appears normal, no obvious gross SOB, gasping or wheezing  CV: no obvious cyanosis  MS: moves all visible extremities without noticeable abnormality  PSYCH/NEURO: pleasant and cooperative, no obvious depression  or anxiety, speech and thought processing grossly intact   Assessment/Plan  1. Essential hypertension Has been stable; continue current medication.  - CBC with Differential/Platelet; Future - Comprehensive metabolic panel; Future - lisinopril (ZESTRIL) 5 MG tablet; TAKE 1 TABLET BY MOUTH EVERY DAY IN THE MORNING  Dispense: 90 tablet; Refill: 1  2. Chronic kidney disease, unspecified CKD stage Has been stable. Follows regularly with nephrology.   3. MULTIPLE MYELOMA, IN REMISSION Following regularly with oncology. - Kappa/lambda light chains; Future - IgA; Future  4. Lipid screening - Lipid panel; Future    Return for december appointment.   I discussed the assessment and treatment plan with the patient. The patient was provided an opportunity to ask questions and all were answered. The patient agreed with the plan and demonstrated an understanding of the instructions.   The patient was advised to call back or seek an in-person evaluation if the symptoms worsen or if the condition fails to improve as anticipated.  I provided 25 minutes of non-face-to-face time during this encounter.   Micheline Rough, MD

## 2020-03-17 ENCOUNTER — Encounter: Payer: Self-pay | Admitting: Oncology

## 2020-04-21 ENCOUNTER — Ambulatory Visit: Payer: BC Managed Care – PPO | Admitting: Family Medicine

## 2020-04-21 ENCOUNTER — Other Ambulatory Visit: Payer: Self-pay

## 2020-04-21 ENCOUNTER — Encounter: Payer: Self-pay | Admitting: Family Medicine

## 2020-04-21 VITALS — BP 98/70 | HR 81 | Temp 98.1°F | Wt 136.0 lb

## 2020-04-21 DIAGNOSIS — C9001 Multiple myeloma in remission: Secondary | ICD-10-CM | POA: Diagnosis not present

## 2020-04-21 DIAGNOSIS — N3001 Acute cystitis with hematuria: Secondary | ICD-10-CM | POA: Diagnosis not present

## 2020-04-21 LAB — POC URINALSYSI DIPSTICK (AUTOMATED)
Bilirubin, UA: NEGATIVE
Glucose, UA: NEGATIVE
Ketones, UA: NEGATIVE
Nitrite, UA: NEGATIVE
Protein, UA: POSITIVE — AB
Spec Grav, UA: 1.015 (ref 1.010–1.025)
Urobilinogen, UA: 0.2 E.U./dL
pH, UA: 6.5 (ref 5.0–8.0)

## 2020-04-21 MED ORDER — AMOXICILLIN-POT CLAVULANATE 500-125 MG PO TABS: 1.0000 | ORAL_TABLET | Freq: Two times a day (BID) | ORAL | 0 refills | Status: AC

## 2020-04-21 NOTE — Progress Notes (Signed)
Subjective:    Patient ID: Cristina Garcia, female    DOB: 1961-01-14, 59 y.o.   MRN: [id number]  No chief complaint on file.   HPI Patient is a 59 year old female with pmh sig for HTN, MVP s/p repair, atrial tachycardia, osteoporosis, CKD, multiple myeloma in remission, seasonal allergies followed by Dr. Ethlyn Gallery who was seen for acute concern.  Patient endorses incomplete bladder emptying, suprapubic pressure hematuria x1 day.  Patient passing occasional small clots the size of an eraser head.  Patient typically drinks water daily.  Endorses back pain last week for which he tried heat, Advil, BenGay.  Patient notes history of multiple myeloma which affected her kidneys.  Followed by nephrology yearly.  Past Medical History:  Diagnosis Date  . Allergy   . Anxiety   . Atrial tachycardia ([place]) 12/18/2014   24 hour event monitor with frequent PAC's, PVC's and short runs of atrial tachycardia - no sustained arrhythmias or atrial fibrillation   . Chronic kidney disease (CKD)   . Constipation   . H/O multiple myeloma    clinical remission  . Hypertension   . Mitral regurgitation   . Mitral valve prolapse   . Osteoporosis   . Pleural effusion, right 11/03/2015  . S/P minimally invasive mitral valve repair 10/15/2015   Complex valvuloplasty including Alfieri edge-to-edge repair, generous quadrangular resection (shortening) of posterior leaflet, sliding leaflet plasty, Gore-tex neochord placement x6, and 38 mm Edwards Physio II ring annuloplasty via right mini thoracotomy approach     No Known Allergies  ROS General: Denies fever, chills, night sweats, changes in weight, changes in appetite HEENT: Denies headaches, ear pain, changes in vision, rhinorrhea, sore throat CV: Denies CP, palpitations, SOB, orthopnea Pulm: Denies SOB, cough, wheezing GI: Denies abdominal pain, nausea, vomiting, diarrhea, constipation GU: Denies dysuria, vaginal discharge  + hematuria, incomplete bladder emptying,  suprapubic pressure, frequency Msk: Denies muscle cramps, joint pains Neuro: Denies weakness, numbness, tingling Skin: Denies rashes, bruising Psych: Denies depression, anxiety, hallucinations    Objective:    Blood pressure 98/70, pulse 81, temperature 98.1 F (36.7 C), temperature source Oral, weight 136 lb (61.7 kg), SpO2 99 %.  Gen. Pleasant, well-nourished, in no distress, normal affect   HEENT: [place]/AT, face symmetric, conjunctiva clear, no scleral icterus, PERRLA, EOMI, nares patent without drainage Lungs: no accessory muscle use, no wheezes or rales Cardiovascular: RRR,  no peripheral edema Abdomen: BS present, soft, NT, mildly increased pressure in suprapubic area with palpation, ND, no hepatosplenomegaly.  No CVA tenderness. Musculoskeletal: No deformities, no cyanosis or clubbing, normal tone Neuro:  A&Ox3, CN II-XII intact, normal gait Skin:  Warm, no lesions/ rash   Wt Readings from Last 3 Encounters:  12/04/19 136 lb 1.6 oz (61.7 kg)  07/03/19 138 lb (62.6 kg)  06/04/19 140 lb 9.6 oz (63.8 kg)    Lab Results  Component Value Date   WBC 4.7 12/04/2019   HGB 15.2 (H) 12/04/2019   HCT 45.6 12/04/2019   PLT 178 12/04/2019   GLUCOSE 87 12/04/2019   CHOL 245 (H) 06/18/2014   TRIG 69.0 06/18/2014   HDL 90.40 06/18/2014   LDLDIRECT 104.4 08/13/2010   LDLCALC 141 (H) 06/18/2014   ALT 21 12/04/2019   AST 21 12/04/2019   NA 139 12/04/2019   K 4.1 12/04/2019   CL 99 12/04/2019   CREATININE 1.50 (H) 12/04/2019   BUN 22 (H) 12/04/2019   CO2 30 12/04/2019   TSH 3.45 06/18/2014   INR 1.7 01/09/2016  HGBA1C 5.1 10/10/2015    Assessment/Plan:  Acute cystitis with hematuria  -UA with 3 + blood, 2 + leuks, protein, no nitrites. -We will send for urine culture -We will start Augmentin..  If needed will adjust therapy based on urine culture results. -Patient encouraged to increase p.o. intake of water and fluids -Given precautions worsening symptoms or increased  clots -consider BMP - Plan: amoxicillin-clavulanate (AUGMENTIN) 500-125 MG tablet, Urine Microscopic Only, POCT Urinalysis Dipstick (Automated), Urine Culture  Multiple myeloma, remission -We will obtain urine micro -Continue to monitor -Continue follow-up with nephrology  F/u prn  Grier Mitts, MD

## 2020-04-21 NOTE — Patient Instructions (Signed)

## 2020-04-22 LAB — URINALYSIS, MICROSCOPIC ONLY
Hyaline Cast: NONE SEEN /LPF
Squamous Epithelial / HPF: NONE SEEN /HPF (ref <=5)

## 2020-05-15 ENCOUNTER — Encounter: Payer: Self-pay | Admitting: Oncology

## 2020-05-19 ENCOUNTER — Encounter: Payer: Self-pay | Admitting: Family Medicine

## 2020-05-19 ENCOUNTER — Other Ambulatory Visit: Payer: Self-pay

## 2020-05-19 ENCOUNTER — Other Ambulatory Visit (HOSPITAL_COMMUNITY)
Admission: RE | Admit: 2020-05-19 | Discharge: 2020-05-19 | Disposition: A | Discharge status: Home / Self Care | Payer: BC Managed Care – PPO | Source: Ambulatory Visit | Attending: Family Medicine | Admitting: Family Medicine

## 2020-05-19 ENCOUNTER — Ambulatory Visit (INDEPENDENT_AMBULATORY_CARE_PROVIDER_SITE_OTHER): Payer: BC Managed Care – PPO | Admitting: Family Medicine

## 2020-05-19 VITALS — BP 98/70 | HR 80 | Temp 97.7°F | Ht 69.25 in | Wt 136.3 lb

## 2020-05-19 DIAGNOSIS — Z13220 Encounter for screening for lipoid disorders: Secondary | ICD-10-CM | POA: Diagnosis not present

## 2020-05-19 DIAGNOSIS — Z23 Encounter for immunization: Secondary | ICD-10-CM | POA: Diagnosis not present

## 2020-05-19 DIAGNOSIS — Z1231 Encounter for screening mammogram for malignant neoplasm of breast: Secondary | ICD-10-CM

## 2020-05-19 DIAGNOSIS — Z124 Encounter for screening for malignant neoplasm of cervix: Secondary | ICD-10-CM | POA: Insufficient documentation

## 2020-05-19 DIAGNOSIS — Z9889 Other specified postprocedural states: Secondary | ICD-10-CM

## 2020-05-19 DIAGNOSIS — M810 Age-related osteoporosis without current pathological fracture: Secondary | ICD-10-CM | POA: Diagnosis not present

## 2020-05-19 DIAGNOSIS — C9001 Multiple myeloma in remission: Secondary | ICD-10-CM

## 2020-05-19 DIAGNOSIS — N189 Chronic kidney disease, unspecified: Secondary | ICD-10-CM

## 2020-05-19 DIAGNOSIS — L989 Disorder of the skin and subcutaneous tissue, unspecified: Secondary | ICD-10-CM

## 2020-05-19 DIAGNOSIS — I10 Essential (primary) hypertension: Secondary | ICD-10-CM

## 2020-05-19 DIAGNOSIS — Z789 Other specified health status: Secondary | ICD-10-CM

## 2020-05-19 DIAGNOSIS — Z0000 Encounter for general adult medical examination without abnormal findings: Secondary | ICD-10-CM | POA: Diagnosis not present

## 2020-05-19 NOTE — Progress Notes (Signed)
Cristina Garcia DOB: 10-28-1960 Encounter date: 05/19/2020  This is a 59 y.o. female who presents for complete physical   History of present illness/Additional concerns:  Worries that cholesterol might be up.   Shingrix? Had chicken pox vaccine in 90's but with step cell this was wiped out; wouldn't give again due to live virus.   COVID booster: completed this in November.  Last pap was 2015 and was normal. We will repeat this today.   HTN:on lisinopril 71m daily. Has been doing fine. Takes the lisinopril in morning and toprol at night. Does well with both of these and will have some lower readings, but states she doesn't drop too low (low 117/75).   MVP s/p repair in 2017:follows with KEna Dawley- last visit 06/2018 and reports great exercise tolerance.  Osteoporosis:dx tied in with the myeloma.On oscal daily.  ?mammogram: states will do after budget season. Not going to do now.   lipids - due for recheck today.   CKD: slightly elevated creat; followed with [facility]. Function has been stable.   Multiple myeloma in remission: follows with [facility];gets bloodwork q 6 months.  Follows yearly for eye exam. Due for dental exam.   Past Medical History:  Diagnosis Date   Allergy    Anxiety    [place] ([facility] 12/18/2014   24 hour event monitor with frequent PAC's, PVC's and short runs of atrial tachycardia - no sustained arrhythmias or atrial fibrillation    Chronic kidney disease (CKD)    Constipation    H/O multiple myeloma    clinical remission   Hypertension    Mitral regurgitation    Mitral valve prolapse    Osteoporosis    Pleural effusion, right 11/03/2015   Pleural effusion, right 11/03/2015   S/P minimally invasive mitral valve repair 10/15/2015   Complex valvuloplasty including Alfieri edge-to-edge repair, generous quadrangular resection (shortening) of posterior leaflet, sliding leaflet plasty, Gore-tex neochord placement  x6, and 38 mm Edwards Physio II ring annuloplasty via right mini thoracotomy approach    Past Surgical History:  Procedure Laterality Date   BREAST ENHANCEMENT SURGERY  2000   CARDIAC CATHETERIZATION N/A 08/18/2015   Procedure: Right/Left Heart Cath and Coronary Angiography;  Surgeon: DLarey Dresser MD;  [facility];  Service: Cardiovascular;  Laterality: N/A;   CESAREAN SECTION  1992   HAMMERTOE RECONSTRUCTION WITH WEIL OSTEOTOMY Left 09/09/2016   Procedure: Left 2-4 MT Weil osteotomies; Left 2nd hammertoe correction and extensor tendon lengthing;  Surgeon: JWylene Simmer MD;  [facility]  Service: Orthopedics;  Laterality: Left;   LIMBAL STEM CELL TRANSPLANT  2008   MITRAL VALVE REPAIR Right 10/15/2015   Procedure: MINIMALLY INVASIVE MITRAL VALVE REPAIR (MVR);  Surgeon: CRexene Alberts MD;  [facility]  Service: Open Heart Surgery;  Laterality: Right;   OOPHORECTOMY  1991   left; got cyst on ovary with torsion when pregnant.   TEE WITHOUT CARDIOVERSION N/A 09/05/2013   Procedure: TRANSESOPHAGEAL ECHOCARDIOGRAM (TEE);  Surgeon: DLarey Dresser MD;  [facility]  Service: Cardiovascular;  Laterality: N/A;   TEE WITHOUT CARDIOVERSION N/A 08/18/2015   Procedure: TRANSESOPHAGEAL ECHOCARDIOGRAM (TEE);  Surgeon: DLarey Dresser MD;  [facility]  Service: Cardiovascular;  Laterality: N/A;   TEE WITHOUT CARDIOVERSION N/A 10/15/2015   Procedure: TRANSESOPHAGEAL ECHOCARDIOGRAM (TEE);  Surgeon: CRexene Alberts MD;  [facility]  Service: Open Heart Surgery;  Laterality: N/A;   WISDOM TOOTH EXTRACTION  1975   No Known Allergies Current Meds  Medication Sig   acyclovir (ZOVIRAX) 200 MG capsule Take 200 mg by mouth daily as needed (BREAKOUTS). Reported on 12/05/2015   aspirin 81 MG tablet Take 81 mg by mouth daily. Reported on 10/08/2015   calcium carbonate (OS-CAL) 600 MG TABS Take 600 mg by mouth daily with breakfast.     cetirizine (ZYRTEC) 10 MG tablet Take 10 mg by mouth daily.   Estrogens, Conjugated (PREMARIN VA) Place 1 application vaginally daily as needed (dryness).    lisinopril (ZESTRIL) 5 MG tablet TAKE 1 TABLET BY MOUTH EVERY DAY IN THE MORNING   metoprolol succinate (TOPROL-XL) 25 MG 24 hr tablet Take 0.5 tablets (12.5 mg total) by mouth daily.   Multiple Vitamins-Minerals (MULTIVITAL PO) Take 1 tablet by mouth daily.     Social History   Tobacco Use   Smoking status: Never Smoker   Smokeless tobacco: Never Used  Substance Use Topics   Alcohol use: Yes    Alcohol/week: 2.0 standard drinks    Types: 2 Glasses of wine per week    Comment: socially   Family History  Problem Relation Age of Onset   Non-Hodgkin's lymphoma Mother    Congestive Heart Failure Mother    Rheumatic fever Mother        with mitral valve replacment   Diabetes Mother    Hypertension Father    Diabetes Other        family hx   Hypertension Other        family hx   Lymphoma Other        family hx   Other Maternal Grandmother        died at 89; no known med problems   Renal cancer Paternal Grandmother    Colon cancer Neg Hx    Stomach cancer Neg Hx      Review of Systems  Constitutional: Negative for activity change, appetite change, chills, fatigue, fever and unexpected weight change.  HENT: Negative for congestion, ear pain, hearing loss, sinus pressure, sinus pain, sore throat and trouble swallowing.   Eyes: Negative for pain and visual disturbance.  Respiratory: Negative for cough, chest tightness, shortness of breath and wheezing.   Cardiovascular: Negative for chest pain, palpitations and leg swelling.  Gastrointestinal: Negative for abdominal pain, blood in stool, constipation, diarrhea, nausea and vomiting.  Genitourinary: Negative for difficulty urinating and menstrual problem.  Musculoskeletal: Negative for arthralgias and back pain.  Skin: Negative for rash.       Skin  rash/spots on forehead. Been there a year; may be enlarging. Sometimes tender if hit (like at hairdresser)   Neurological: Negative for dizziness, weakness, numbness and headaches.  Hematological: Negative for adenopathy. Does not bruise/bleed easily.  Psychiatric/Behavioral: Negative for sleep disturbance and suicidal ideas. The patient is not nervous/anxious.     CBC:  Lab Results  Component Value Date   WBC 3.9 05/19/2020   HGB 16.1 (H) 05/19/2020   HGB 15.2 (H) 12/04/2019   HGB 15.5 04/14/2017   HCT 47.9 (H) 05/19/2020   HCT 45.5 04/14/2017   MCH 32.7 05/19/2020   MCHC 33.6 05/19/2020   RDW 11.9 05/19/2020   RDW 12.6 04/14/2017   PLT 173 05/19/2020   PLT 178 12/04/2019   PLT 167 04/14/2017   MPV 10.2 05/19/2020   CMP: Lab Results  Component Value Date   NA 141 05/19/2020   NA 139 04/14/2017   K 4.7 05/19/2020   K 4.6 04/14/2017  CL 100 05/19/2020   CL 102 07/25/2012   CO2 32 05/19/2020   CO2 27 04/14/2017   ANIONGAP 10 12/04/2019   GLUCOSE 76 05/19/2020   GLUCOSE 79 04/14/2017   GLUCOSE 70 07/25/2012   BUN 22 05/19/2020   BUN 23.2 04/14/2017   CREATININE 1.18 (H) 05/19/2020   CREATININE 1.2 (H) 04/14/2017   LABGLOB 2.4 06/04/2019   GFRAA 43 (L) 12/04/2019   CALCIUM 10.9 (H) 05/19/2020   CALCIUM 9.9 04/14/2017   PROT 7.1 05/19/2020   PROT 6.6 04/14/2017   PROT 6.7 07/30/2014   AGRATIO 1.7 06/04/2019   BILITOT 1.1 05/19/2020   BILITOT 0.8 12/04/2019   BILITOT 1.00 07/30/2014   ALKPHOS 64 12/04/2019   ALKPHOS 58 07/30/2014   ALT 17 05/19/2020   ALT 21 12/04/2019   ALT 18 07/30/2014   AST 22 05/19/2020   AST 21 12/04/2019   AST 23 07/30/2014   LIPID: Lab Results  Component Value Date   CHOL 251 (H) 05/19/2020   TRIG 83 05/19/2020   HDL 100 05/19/2020   LDLCALC 133 (H) 05/19/2020    Objective:  BP 98/70 (BP Location: Left Arm, Patient Position: Sitting, Cuff Size: Normal)    Pulse 80    Temp 97.7 F (36.5 C) (Oral)    Ht 5' 9.25" (1.759 m)     Wt 136 lb 4.8 oz (61.8 kg)    BMI 19.98 kg/m   Weight: 136 lb 4.8 oz (61.8 kg)   BP Readings from Last 3 Encounters:  05/19/20 98/70  04/21/20 98/70  12/04/19 95/73   Wt Readings from Last 3 Encounters:  05/19/20 136 lb 4.8 oz (61.8 kg)  04/21/20 136 lb (61.7 kg)  12/04/19 136 lb 1.6 oz (61.7 kg)    Physical Exam Constitutional:      General: She is not in acute distress.    Appearance: She is well-developed.  HENT:     Head: Normocephalic and atraumatic.     Right Ear: External ear normal.     Left Ear: External ear normal.     Mouth/Throat:     Pharynx: No oropharyngeal exudate.  Eyes:     Conjunctiva/sclera: Conjunctivae normal.     Pupils: Pupils are equal, round, and reactive to light.  Neck:     Thyroid: No thyromegaly.  Cardiovascular:     Rate and Rhythm: Normal rate and regular rhythm.     Heart sounds: Normal heart sounds. No murmur heard.  No friction rub. No gallop.   Pulmonary:     Effort: Pulmonary effort is normal.     Breath sounds: Normal breath sounds.  Chest:     Breasts:        Right: No inverted nipple.        Left: No inverted nipple.     Comments: Bilateral implants make exam more difficult. Implants intact. Abdominal:     General: Bowel sounds are normal. There is no distension.     Palpations: Abdomen is soft. There is no mass.     Tenderness: There is no abdominal tenderness. There is no guarding.     Hernia: No hernia is present.  Genitourinary:    General: Normal vulva.     Exam position: Supine.     Labia:        Right: Rash present. No tenderness.        Left: No rash or tenderness.      Vagina: Normal.     Cervix: Normal.  Uterus: Normal.      Adnexa: Right adnexa normal and left adnexa normal.       Right: No mass or tenderness.         Left: No mass or tenderness.    Musculoskeletal:        General: No tenderness or deformity. Normal range of motion.     Cervical back: Normal range of motion and neck supple.   Lymphadenopathy:     Cervical: No cervical adenopathy.     Upper Body:     Right upper body: No supraclavicular or axillary adenopathy.     Left upper body: No supraclavicular or axillary adenopathy.  Skin:    General: Skin is warm and dry.     Findings: No rash.     Comments: Light pink scaled macules upper central forehead.  Neurological:     Mental Status: She is alert and oriented to person, place, and time.     Deep Tendon Reflexes: Reflexes normal.     Reflex Scores:      Tricep reflexes are 2+ on the right side and 2+ on the left side.      Bicep reflexes are 2+ on the right side and 2+ on the left side.      Brachioradialis reflexes are 2+ on the right side and 2+ on the left side.      Patellar reflexes are 2+ on the right side and 2+ on the left side. Psychiatric:        Speech: Speech normal.        Behavior: Behavior normal.        Thought Content: Thought content normal.     Assessment/Plan: Health Maintenance Due  Topic Date Due   PAP SMEAR-Modifier  06/21/2016   Health Maintenance reviewed.  1. Preventative health care Due for mammogram; this has been ordered.   2. Need for Tdap vaccination - Tdap vaccine greater than or equal to 7yo IM  3. Unknown varicella vaccination status - Varicella zoster antibody, IgG; Future  4. Essential hypertension Well controlled. Continue  Lisinopril 47m daily. - CBC with Differential/Platelet; Future - Comprehensive metabolic panel; Future  5. Skin lesion of face If it takes her more than a couple of months to get in; advised I could biopsy lesions. - Ambulatory referral to Dermatology  6. Osteoporosis, unspecified osteoporosis type, unspecified pathological fracture presence - VITAMIN D 25 Hydroxy (Vit-D Deficiency, Fractures); Future  7. Chronic kidney disease, unspecified CKD stage Follows w nephrology regularly.   8. MULTIPLE MYELOMA, IN REMISSION Follows with oncology regularly. Has been stable.  9. S/P  minimally invasive mitral valve repair Follows regularly with cardiology. No current concerns.  10. Encounter for screening mammogram for malignant neoplasm of breast - MM Digital Screening; Future  11. Lipid screening - Lipid panel; Future  Return in about 6 months (around 11/17/2020) for Chronic condition visit.  JMicheline Rough MD

## 2020-05-21 LAB — CYTOLOGY - PAP
Comment: NEGATIVE
Diagnosis: NEGATIVE
High risk HPV: NEGATIVE

## 2020-05-22 ENCOUNTER — Encounter: Payer: Self-pay | Admitting: Family Medicine

## 2020-05-25 LAB — CBC WITH DIFFERENTIAL/PLATELET
Absolute Monocytes: 468 cells/uL (ref 200–950)
Basophils Absolute: 31 cells/uL (ref 0–200)
Basophils Relative: 0.8 %
Eosinophils Absolute: 39 cells/uL (ref 15–500)
Eosinophils Relative: 1 %
HCT: 47.9 % — ABNORMAL HIGH (ref 35.0–45.0)
Hemoglobin: 16.1 g/dL — ABNORMAL HIGH (ref 11.7–15.5)
Lymphs Abs: 1190 cells/uL (ref 850–3900)
MCH: 32.7 pg (ref 27.0–33.0)
MCHC: 33.6 g/dL (ref 32.0–36.0)
MCV: 97.4 fL (ref 80.0–100.0)
MPV: 10.2 fL (ref 7.5–12.5)
Monocytes Relative: 12 %
Neutro Abs: 2172 cells/uL (ref 1500–7800)
Neutrophils Relative %: 55.7 %
Platelets: 173 10*3/uL (ref 140–400)
RBC: 4.92 10*6/uL (ref 3.80–5.10)
RDW: 11.9 % (ref 11.0–15.0)
Total Lymphocyte: 30.5 %
WBC: 3.9 10*3/uL (ref 3.8–10.8)

## 2020-05-25 LAB — TEST AUTHORIZATION 2

## 2020-05-25 LAB — VARICELLA ZOSTER ANTIBODY, IGG: Varicella IgG: <135 index — ABNORMAL LOW

## 2020-05-25 LAB — COMPREHENSIVE METABOLIC PANEL
AG Ratio: 2 (calc) (ref 1.0–2.5)
ALT: 17 U/L (ref 6–29)
AST: 22 U/L (ref 10–35)
Albumin: 4.7 g/dL (ref 3.6–5.1)
Alkaline phosphatase (APISO): 61 U/L (ref 37–153)
BUN/Creatinine Ratio: 19 (calc) (ref 6–22)
BUN: 22 mg/dL (ref 7–25)
CO2: 32 mmol/L (ref 20–32)
Calcium: 10.9 mg/dL — ABNORMAL HIGH (ref 8.6–10.4)
Chloride: 100 mmol/L (ref 98–110)
Creat: 1.18 mg/dL — ABNORMAL HIGH (ref 0.50–0.99)
Globulin: 2.4 g/dL (calc) (ref 1.9–3.7)
Glucose, Bld: 76 mg/dL (ref 65–99)
Potassium: 4.7 mmol/L (ref 3.5–5.3)
Sodium: 141 mmol/L (ref 135–146)
Total Bilirubin: 1.1 mg/dL (ref 0.2–1.2)
Total Protein: 7.1 g/dL (ref 6.1–8.1)

## 2020-05-25 LAB — IFE INTERPRETATION

## 2020-05-25 LAB — LIPID PANEL
Cholesterol: 251 mg/dL — ABNORMAL HIGH (ref <200)
HDL: 100 mg/dL (ref >=50)
LDL Cholesterol (Calc): 133 mg/dL (calc) — ABNORMAL HIGH
Non-HDL Cholesterol (Calc): 151 mg/dL (calc) — ABNORMAL HIGH (ref <130)
Total CHOL/HDL Ratio: 2.5 (calc) (ref <5.0)
Triglycerides: 83 mg/dL (ref <150)

## 2020-05-25 LAB — TEST AUTHORIZATION

## 2020-05-25 LAB — IGA: Immunoglobulin A: 125 mg/dL (ref 70–320)

## 2020-05-25 LAB — VITAMIN D 25 HYDROXY (VIT D DEFICIENCY, FRACTURES): Vit D, 25-Hydroxy: 53 ng/mL (ref 30–100)

## 2020-05-26 ENCOUNTER — Inpatient Hospital Stay: Payer: BC Managed Care – PPO

## 2020-05-26 ENCOUNTER — Other Ambulatory Visit: Payer: Self-pay

## 2020-05-26 ENCOUNTER — Inpatient Hospital Stay: Payer: BC Managed Care – PPO | Attending: Oncology | Admitting: Oncology

## 2020-05-26 VITALS — BP 95/68 | HR 69 | Temp 98.6°F | Resp 17 | Ht 69.25 in | Wt 136.8 lb

## 2020-05-26 DIAGNOSIS — Z87440 Personal history of urinary (tract) infections: Secondary | ICD-10-CM | POA: Diagnosis not present

## 2020-05-26 DIAGNOSIS — N189 Chronic kidney disease, unspecified: Secondary | ICD-10-CM | POA: Diagnosis not present

## 2020-05-26 DIAGNOSIS — M8580 Other specified disorders of bone density and structure, unspecified site: Secondary | ICD-10-CM | POA: Diagnosis not present

## 2020-05-26 DIAGNOSIS — I341 Nonrheumatic mitral (valve) prolapse: Secondary | ICD-10-CM | POA: Diagnosis not present

## 2020-05-26 DIAGNOSIS — C9000 Multiple myeloma not having achieved remission: Secondary | ICD-10-CM | POA: Insufficient documentation

## 2020-05-26 DIAGNOSIS — C9001 Multiple myeloma in remission: Secondary | ICD-10-CM

## 2020-05-26 LAB — CMP (CANCER CENTER ONLY)
ALT: 13 U/L (ref 0–44)
AST: 18 U/L (ref 15–41)
Albumin: 4.1 g/dL (ref 3.5–5.0)
Alkaline Phosphatase: 61 U/L (ref 38–126)
Anion gap: 8 (ref 5–15)
BUN: 27 mg/dL — ABNORMAL HIGH (ref 8–23)
CO2: 29 mmol/L (ref 22–32)
Calcium: 9.9 mg/dL (ref 8.9–10.3)
Chloride: 102 mmol/L (ref 98–111)
Creatinine: 1.29 mg/dL — ABNORMAL HIGH (ref 0.44–1.00)
GFR, Estimated: 47 mL/min — ABNORMAL LOW (ref >60)
Glucose, Bld: 60 mg/dL — ABNORMAL LOW (ref 70–99)
Potassium: 4 mmol/L (ref 3.5–5.1)
Sodium: 139 mmol/L (ref 135–145)
Total Bilirubin: 0.8 mg/dL (ref 0.3–1.2)
Total Protein: 6.9 g/dL (ref 6.5–8.1)

## 2020-05-26 NOTE — Progress Notes (Signed)
  [place] OFFICE PROGRESS NOTE   Diagnosis: Multiple myeloma  INTERVAL HISTORY:   Cristina Garcia returns for a scheduled visit.  She feels well.  She reports 2 urinary tract infections over the past several months.  No other complaint.  Objective:  Vital signs in last 24 hours:  Blood pressure 95/68, pulse 69, temperature 98.6 F (37 C), temperature source Tympanic, resp. rate 17, height 5' 9.25" (1.759 m), weight 136 lb 12.8 oz (62.1 kg), SpO2 99 %.    Resp: Lungs clear bilaterally Cardio: Regular rate and rhythm GI: No hepatosplenomegaly Vascular: No leg edema   Lab Results:  Lab Results  Component Value Date   WBC 3.9 05/19/2020   HGB 16.1 (H) 05/19/2020   HCT 47.9 (H) 05/19/2020   MCV 97.4 05/19/2020   PLT 173 05/19/2020   NEUTROABS 2,172 05/19/2020    CMP  Lab Results  Component Value Date   NA 141 05/19/2020   K 4.7 05/19/2020   CL 100 05/19/2020   CO2 32 05/19/2020   GLUCOSE 76 05/19/2020   BUN 22 05/19/2020   CREATININE 1.18 (H) 05/19/2020   CALCIUM 10.9 (H) 05/19/2020   PROT 7.1 05/19/2020   ALBUMIN 4.1 12/04/2019   AST 22 05/19/2020   ALT 17 05/19/2020   ALKPHOS 64 12/04/2019   BILITOT 1.1 05/19/2020   GFRNONAA 37 (L) 12/04/2019   GFRAA 43 (L) 12/04/2019     Medications: I have reviewed the patient's current medications.   Assessment/Plan: 1. Multiple myeloma diagnosed in November 2007, IgA kappa, status post high-dose chemotherapy with autologous stem cell support at [facility] in June 2008. She remains in clinical remission. 2. history of Renal insufficiency secondary to multiple myeloma, now with chronic renal insufficiency 3. History of hypokalemia. 4. History of left pelvic pain secondary to a lytic left iliac lesion. A metastatic bone survey  04/20/2017 revealed stable lucencies at the left sacral ala and medial left iliac, questionable new tiny lucency at the superior aspect of the left sacral ala 5. Simple cyst at the lower  pole of the right kidney on a renal ultrasound, 11/08/2006. 6. Multiseptated cyst of the left adnexa on an ultrasound, 11/08/2006. 7. Osteopenia 8. Mitral valve prolapse/mitral regurgitation-minimally invasive mitral valve repair 10/15/2015 9. Report of intermittent rectal bleeding. Normal colonoscopy by Dr. Fuller Plan on 08/20/2011.    Disposition: Cristina Garcia is in remission from multiple myeloma.  We will follow up on the serum light chains from today.  The calcium level was elevated when she was seen by her primary provider earlier this week.  She had been taking a calcium supplement.  We repeated a chemistry panel today.  Cristina Garcia will return for an office and lab visit in 6 months.  She has received the COVID-19 and influenza vaccines.  She will return for an office visit and bone survey in 6 months.  [name], MD  05/26/2020  4:01 PM

## 2020-05-27 LAB — KAPPA/LAMBDA LIGHT CHAINS
Kappa free light chain: 20.3 mg/L — ABNORMAL HIGH (ref 3.3–19.4)
Kappa, lambda light chain ratio: 1.29 (ref 0.26–1.65)
Lambda free light chains: 15.7 mg/L (ref 5.7–26.3)

## 2020-05-28 ENCOUNTER — Encounter: Payer: Self-pay | Admitting: Oncology

## 2020-06-23 ENCOUNTER — Other Ambulatory Visit: Payer: Self-pay

## 2020-06-23 ENCOUNTER — Ambulatory Visit (HOSPITAL_COMMUNITY): Payer: BC Managed Care – PPO | Attending: Internal Medicine

## 2020-06-23 DIAGNOSIS — Z954 Presence of other heart-valve replacement: Secondary | ICD-10-CM | POA: Diagnosis not present

## 2020-06-23 DIAGNOSIS — Z9889 Other specified postprocedural states: Secondary | ICD-10-CM | POA: Insufficient documentation

## 2020-06-23 LAB — ECHOCARDIOGRAM COMPLETE
Area-P 1/2: 3.12 cm2
MV M vel: 5.09 m/s
MV Peak grad: 103.6 mmHg
Radius: 0.7 cm
S' Lateral: 2.9 cm

## 2020-06-26 NOTE — Progress Notes (Deleted)
Cardiology Office Note    Date:  06/26/2020   ID:  Cristina Garcia, DOB June 23, 1960, MRN [id number]  PCP:  Caren Macadam, MD  Cardiologist: Ena Dawley, MD EPS: None  No chief complaint on file.   History of Present Illness:  Cristina Garcia is a 60 y.o. female with history of severe MR, normal Coronaries on cath, S/P minimally invasive MV repair 2017. Post op required right thoracentesis. EF post op 11/2015 EF 45%, mild L dilation, stable MV repair, f/u echo 05/2018 LVEF 50-55%.   I saw patient 07/03/19 for yearly echo which was unchanged and she was doing well.  Echo 06/23/20 shows slight progression of MR. Mean gradient went from 6 to 7 mmHg, moderate MR, mild to mod MS  Past Medical History:  Diagnosis Date  . Allergy   . Anxiety   . Atrial tachycardia ([place]) 12/18/2014   24 hour event monitor with frequent PAC's, PVC's and short runs of atrial tachycardia - no sustained arrhythmias or atrial fibrillation   . Chronic kidney disease (CKD)   . Constipation   . H/O multiple myeloma    clinical remission  . Hypertension   . Mitral regurgitation   . Mitral valve prolapse   . Osteoporosis   . Pleural effusion, right 11/03/2015  . Pleural effusion, right 11/03/2015  . S/P minimally invasive mitral valve repair 10/15/2015   Complex valvuloplasty including Alfieri edge-to-edge repair, generous quadrangular resection (shortening) of posterior leaflet, sliding leaflet plasty, Gore-tex neochord placement x6, and 38 mm Edwards Physio II ring annuloplasty via right mini thoracotomy approach     Past Surgical History:  Procedure Laterality Date  . BREAST ENHANCEMENT SURGERY  2000  . CARDIAC CATHETERIZATION N/A 08/18/2015   Procedure: Right/Left Heart Cath and Coronary Angiography;  Surgeon: Larey Dresser, MD;  Location: [facility];  Service: Cardiovascular;  Laterality: N/A;  . [place]  . HAMMERTOE RECONSTRUCTION WITH WEIL OSTEOTOMY Left 09/09/2016    Procedure: Left 2-4 MT Weil osteotomies; Left 2nd hammertoe correction and extensor tendon lengthing;  Surgeon: Wylene Simmer, MD;  Location: [facility];  Service: Orthopedics;  Laterality: Left;  . LIMBAL STEM CELL TRANSPLANT  2008  . MITRAL VALVE REPAIR Right 10/15/2015   Procedure: MINIMALLY INVASIVE MITRAL VALVE REPAIR (MVR);  Surgeon: Rexene Alberts, MD;  Location: [facility];  Service: Open Heart Surgery;  Laterality: Right;  . OOPHORECTOMY  1991   left; got cyst on ovary with torsion when pregnant.  . TEE WITHOUT CARDIOVERSION N/A 09/05/2013   Procedure: TRANSESOPHAGEAL ECHOCARDIOGRAM (TEE);  Surgeon: Larey Dresser, MD;  Location: [facility];  Service: Cardiovascular;  Laterality: N/A;  . TEE WITHOUT CARDIOVERSION N/A 08/18/2015   Procedure: TRANSESOPHAGEAL ECHOCARDIOGRAM (TEE);  Surgeon: Larey Dresser, MD;  Location: [facility];  Service: Cardiovascular;  Laterality: N/A;  . TEE WITHOUT CARDIOVERSION N/A 10/15/2015   Procedure: TRANSESOPHAGEAL ECHOCARDIOGRAM (TEE);  Surgeon: Rexene Alberts, MD;  Location: [facility];  Service: Open Heart Surgery;  Laterality: N/A;  . WISDOM TOOTH EXTRACTION  1975    Current Medications: No outpatient medications have been marked as taking for the 07/02/20 encounter (Appointment) with Imogene Burn, PA-C.     Allergies:   Patient has no known allergies.   Social History   Socioeconomic History  . Marital status: Married    Spouse name: Not on file  . Number of children: Not on file  . Years of education: Not on file  .  Highest education level: Not on file  Occupational History  . Not on file  Tobacco Use  . Smoking status: Never Smoker  . Smokeless tobacco: Never Used  Substance and Sexual Activity  . Alcohol use: Yes    Alcohol/week: 2.0 standard drinks    Types: 2 Glasses of wine per week    Comment: socially  . Drug use: No  . Sexual activity: Not on file  Other Topics Concern  . Not on file  Social History Narrative  .  Not on file   Social Determinants of Health   Financial Resource Strain: Not on file  Food Insecurity: Not on file  Transportation Needs: Not on file  Physical Activity: Not on file  Stress: Not on file  Social Connections: Not on file     Family History:  The patient's ***family history includes Congestive Heart Failure in her mother; Diabetes in her mother and another family member; Hypertension in her father and another family member; Lymphoma in an other family member; Non-Hodgkin's lymphoma in her mother; Other in her maternal grandmother; Renal cancer in her paternal grandmother; Rheumatic fever in her mother.   ROS:   Please see the history of present illness.    ROS All other systems reviewed and are negative.   PHYSICAL EXAM:   VS:  There were no vitals taken for this visit.  Physical Exam  GEN: Well nourished, well developed, in no acute distress  HEENT: normal  Neck: no JVD, carotid bruits, or masses Cardiac:RRR; no murmurs, rubs, or gallops  Respiratory:  clear to auscultation bilaterally, normal work of breathing GI: soft, nontender, nondistended, + BS Ext: without cyanosis, clubbing, or edema, Good distal pulses bilaterally MS: no deformity or atrophy  Skin: warm and dry, no rash Neuro:  Alert and Oriented x 3, Strength and sensation are intact Psych: euthymic mood, full affect  Wt Readings from Last 3 Encounters:  05/26/20 136 lb 12.8 oz (62.1 kg)  05/19/20 136 lb 4.8 oz (61.8 kg)  04/21/20 136 lb (61.7 kg)      Studies/Labs Reviewed:   EKG:  EKG is*** ordered today.  The ekg ordered today demonstrates ***  Recent Labs: 05/19/2020: Hemoglobin 16.1; Platelets 173 05/26/2020: ALT 13; BUN 27; Creatinine 1.29; Potassium 4.0; Sodium 139   Lipid Panel    Component Value Date/Time   CHOL 251 (H) 05/19/2020 1021   TRIG 83 05/19/2020 1021   HDL 100 05/19/2020 1021   CHOLHDL 2.5 05/19/2020 1021   VLDL 13.8 06/18/2014 0856   LDLCALC 133 (H) 05/19/2020 1021    LDLDIRECT 104.4 08/13/2010 0835    Additional studies/ records that were reviewed today include:   2Decho 06/23/20 IMPRESSIONS     1. Left ventricular ejection fraction, by estimation, is 60 to 65%. The  left ventricle has normal function. The left ventricle has no regional  wall motion abnormalities. Left ventricular diastolic parameters are  indeterminate.   2. Right ventricular systolic function is normal. The right ventricular  size is normal. There is normal pulmonary artery systolic pressure.   3. Left atrial size was mildly dilated.   4. The mitral valve has been repaired/replaced. At least moderate,  eccentric mitral valve regurgitation, with systolic blunting the the  pulmonary vein spectral Doppler. Mild to moderate mitral stenosis. There  is a 38 prosthetic annuloplasty ring present   in the mitral position. Procedure Date: 10/15/15.   5. The aortic valve is grossly normal. Aortic valve regurgitation is not  visualized. No  aortic stenosis is present.   6. The inferior vena cava is normal in size with greater than 50%  respiratory variability, suggesting right atrial pressure of 3 mmHg.   Comparison(s): A prior study was performed on 06/25/2019. Compared to  prior, increase in mitral regurgitation and pulmonary vein spectral  Doppler without change in left ventricular function, size, or mitral  stenosis gradients.  FINDINGS   Left Ventricle: Left ventricular ejection fraction, by estimation, is 60  to 65%. The left ventricle has normal function. The left ventricle has no  regional wall motion abnormalities. The left ventricular internal cavity  size was normal in size. There is   no left ventricular hypertrophy. Abnormal (paradoxical) septal motion  consistent with post-operative status. Left ventricular diastolic  parameters are indeterminate.   Right Ventricle: The right ventricular size is normal. Right vetricular  wall thickness was not well visualized. Right  ventricular systolic  function is normal. There is normal pulmonary artery systolic pressure.  The tricuspid regurgitant velocity is 2.65  m/s, and with an assumed right atrial pressure of 3 mmHg, the estimated  right ventricular systolic pressure is 71.0 mmHg.   Left Atrium: Left atrial size was mildly dilated.   Right Atrium: Right atrial size was normal in size.   Pericardium: There is no evidence of pericardial effusion.   Mitral Valve: The mitral valve has been repaired/replaced. Moderate mitral  valve regurgitation. There is a 38 prosthetic annuloplasty ring present in  the mitral position. Procedure Date: 10/15/15. Mild to moderate mitral valve  stenosis. MV peak gradient,  15.4 mmHg. The mean mitral valve gradient is 7.0 mmHg.   Tricuspid Valve: The tricuspid valve is grossly normal. Tricuspid valve  regurgitation is mild.   Aortic Valve: The aortic valve is grossly normal. Aortic valve  regurgitation is not visualized. No aortic stenosis is present.   Pulmonic Valve: The pulmonic valve was grossly normal. Pulmonic valve  regurgitation is trivial.   Aorta: The aortic root and ascending aorta are structurally normal, with  no evidence of dilitation.   Venous: A systolic blunting flow pattern is recorded from the right upper  pulmonary vein. The inferior vena cava is normal in size with greater than  50% respiratory variability, suggesting right atrial pressure of 3 mmHg.   IAS/Shunts: The atrial septum is grossly normal.     LEFT VENTRICLE  PLAX 2D  LVIDd:         4.50 cm  Diastology  LVIDs:         2.90 cm  LV e' medial:    2.75 cm/s  LV PW:         1.00 cm  LV E/e' medial:  63.3  LV IVS:        0.60 cm  LV e' lateral:   3.64 cm/s  LVOT diam:     2.00 cm  LV E/e' lateral: 47.8  LV SV:         54  LV SV Index:   31       2D Longitudinal Strain  LVOT Area:     3.14 cm 2D Strain GLS (A2C):   -15.4 %                          2D Strain GLS (A3C):   -24.2 %  2D Strain GLS (A4C):   -15.8 %                          2D Strain GLS Avg:     -18.5 %                            3D Volume EF:                          3D EF:        66 %                          LV EDV:       86 ml                          LV ESV:       30 ml                          LV SV:        56 ml   RIGHT VENTRICLE  RV Basal diam:  3.30 cm  RV S prime:     9.83 cm/s  TAPSE (M-mode): 1.8 cm  RVSP:           31.1 mmHg   LEFT ATRIUM             Index       RIGHT ATRIUM           Index  LA diam:        3.40 cm 1.93 cm/m  RA Pressure: 3.00 mmHg  LA Vol (A2C):   47.0 ml 26.67 ml/m RA Area:     15.30 cm  LA Vol (A4C):   45.6 ml 25.87 ml/m RA Volume:   37.90 ml  21.50 ml/m  LA Biplane Vol: 49.9 ml 28.31 ml/m   AORTIC VALVE  LVOT Vmax:   80.30 cm/s  LVOT Vmean:  53.000 cm/s  LVOT VTI:    0.173 m    AORTA  Ao Root diam: 3.30 cm  Ao Asc diam:  3.10 cm   MITRAL VALVE                 TRICUSPID VALVE  MV Area (PHT): 3.12 cm      TR Peak grad:   28.1 mmHg  MV Peak grad:  15.4 mmHg     TR Vmax:        265.00 cm/s  MV Mean grad:  7.0 mmHg      Estimated RAP:  3.00 mmHg  MV Vmax:       1.96 m/s      RVSP:           31.1 mmHg  MV Vmean:      123.0 cm/s  MV Decel Time: 243 msec      SHUNTS  MR Peak grad:    103.6 mmHg  Systemic VTI:  0.17 m  MR Mean grad:    67.0 mmHg   Systemic Diam: 2.00 cm  MR Vmax:         509.00 cm/s  MR Vmean:        388.0 cm/s  MR PISA:         3.08 cm  MR PISA Eff ROA:  19 mm  MR PISA Radius:  0.70 cm  MV E velocity: 174.00 cm/s  MV A velocity: 119.00 cm/s  MV E/A ratio:  1.46   Rudean Haskell MD  Electronically signed by Rudean Haskell MD  Signature Date/Time: 06/23/2020/12:57:55 PM        Final      Echo 06/25/19    IMPRESSIONS      1. Left ventricular ejection fraction, by visual estimation, is 55 to 60%. The left ventricle has normal function. There is no left ventricular hypertrophy.  2. Abnormal septal  motion consistent with post-operative status.  3. Left ventricular diastolic function could not be evaluated.  4. The left ventricle has no regional wall motion abnormalities.  5. Global right ventricle has normal systolic function.The right ventricular size is normal. No increase in right ventricular wall thickness.  6. Left atrial size was mildly dilated.  7. The mitral valve has been repaired/replaced. Mild mitral valve regurgitation. Mild-moderate mitral stenosis.  8. S/p Alfieri repair and 38 mm annuloplasty ring 10/15/2015. Mild to moderate mitral stenosis, mean gradient 6 mmHG, EOA 2.2 cm2 at heart rate 76 bpm.  9. The tricuspid valve is grossly normal. 10. The aortic valve is tricuspid. Aortic valve regurgitation is not visualized. No evidence of aortic valve sclerosis or stenosis. 11. Normal pulmonary artery systolic pressure. 12. The tricuspid regurgitant velocity is 2.14 m/s, and with an assumed right atrial pressure of 3 mmHg, the estimated right ventricular systolic pressure is normal at 21.2 mmHg. 13. The inferior vena cava is normal in size with greater than 50% respiratory variability, suggesting right atrial pressure of 3 mmHg.   In comparison to the previous echocardiogram(s): No significant change compared with 06/12/2018     TTE: 03/30/2017 - Left ventricle: The cavity size was normal. Systolic function was   mildly reduced. The estimated ejection fraction was in the range   of 45% to 50%. Mild hypokinesis of the anteroseptal and   inferoseptal myocardium. - Ventricular septum: Septal motion showed &quot;bounce&quot;. - Aortic valve: Transvalvular velocity was within the normal range.   There was no stenosis. There was no regurgitation. - Mitral valve: Prior procedures included surgical repair. An   annular ring prosthesis was present. There was mild   regurgitation. Mitral valve inflow gradient is moderately   elevated. Pressure half-time: 147 ms. Mean gradient (D): 8 mm  Hg.   Valve area by pressure half-time: 1.5 cm^2. Valve area by   continuity equation (using LVOT flow): 0.71 cm^2. - Left atrium: The atrium was mildly to moderately dilated. - Right ventricle: The cavity size was normal. Wall thickness was   normal. - Tricuspid valve: There was mild regurgitation. - Pulmonary arteries: Systolic pressure was within the normal   range. PA peak pressure: 32 mm Hg (S).   Impressions:   - Compared with the echo 11/2015, the mitral valve pressure   half-time has increased from 68 ms to 147 ms, which correlates   with a decrease in the calculated mitral valve area from 3.24   cm^2 to 1.5 cm^2. However, the mean gradient is unchanged at 8   mmHg. Overall valve function is stable. The previously noted   doming of the anterior mitral valve leaflet is still present but   less prominent. The left atrium is now much smaller.   TTE: 05/2018 - Left ventricle: The cavity size was normal. Wall thickness was   normal. Systolic function was normal. The estimated ejection   fraction was in the  range of 50% to 55%. Wall motion was normal;   there were no regional wall motion abnormalities. The study is   not technically sufficient to allow evaluation of LV diastolic   function. Doppler parameters are consistent with high ventricular   filling pressure. - Mitral valve: Calcified annulus. Moderately thickened leaflets .   Prior procedures included surgical repair. The findings are   consistent with moderate stenosis. There was mild to moderate   regurgitation. - Pulmonary arteries: Systolic pressure was mildly increased.   Impressions: - Normal LV systolic function; elevated LV filling pressure; s/p MV   repair with probable moderate MS (mean gradient 8 mmHg; MVA by   pressure half time 1.9 cm2); mild to moderate MR; mild TR with   mild pulmonary hyptertension.        Risk Assessment/Calculations:   {Does this patient have ATRIAL  FIBRILLATION?[phone]}     ASSESSMENT:    No diagnosis found.   PLAN:  In order of problems listed above:  Status post minimally invasive mitral valve repair 2017.  Echo 06/23/2020  normal LV function with slight increase in MR now moderate mean mitral valve gradient went from 6 mmHg to 8mHg . Needs yearly echos. Doing well without symptoms and exercises daily.   HTN BP controlled on lisinopril and toprol   Hyperlipidemia- hasn't had cholesterol checked since 2016. Not fasting today so she'll have PCP check it.       Shared Decision Making/Informed Consent   {Are you ordering a CV Procedure (e.g. stress test, cath, DCCV, TEE, etc)?   Press F2        :[id number]}   Medication Adjustments/Labs and Tests Ordered: Current medicines are reviewed at length with the patient today.  Concerns regarding medicines are outlined above.  Medication changes, Labs and Tests ordered today are listed in the Patient Instructions below. There are no Patient Instructions on file for this visit.   SSumner Boast PA-C  06/26/2020 8:44 AM    [facility] [address] [place]  [id number]: [phone] Fax: [phone]

## 2020-07-02 ENCOUNTER — Ambulatory Visit: Payer: BC Managed Care – PPO | Admitting: Physician Assistant

## 2020-07-02 DIAGNOSIS — Z9889 Other specified postprocedural states: Secondary | ICD-10-CM

## 2020-07-02 DIAGNOSIS — I10 Essential (primary) hypertension: Secondary | ICD-10-CM

## 2020-07-02 DIAGNOSIS — E785 Hyperlipidemia, unspecified: Secondary | ICD-10-CM

## 2020-07-04 DIAGNOSIS — L570 Actinic keratosis: Secondary | ICD-10-CM | POA: Diagnosis not present

## 2020-07-09 ENCOUNTER — Telehealth: Payer: Self-pay

## 2020-07-09 NOTE — Telephone Encounter (Signed)
  Patient Consent for Virtual Visit         Cristina Garcia has provided verbal consent on 07/09/2020 for a virtual visit (video or telephone).   CONSENT FOR VIRTUAL VISIT FOR:  Cristina Garcia  By participating in this virtual visit I agree to the following:  I hereby voluntarily request, consent and authorize [facility] and its employed or contracted physicians, physician assistants, nurse practitioners or other licensed health care professionals (the Practitioner), to provide me with telemedicine health care services (the "Services") as deemed necessary by the treating Practitioner. I acknowledge and consent to receive the Services by the Practitioner via telemedicine. I understand that the telemedicine visit will involve communicating with the Practitioner through live audiovisual communication technology and the disclosure of certain medical information by electronic transmission. I acknowledge that I have been given the opportunity to request an in-person assessment or other available alternative prior to the telemedicine visit and am voluntarily participating in the telemedicine visit.  I understand that I have the right to withhold or withdraw my consent to the use of telemedicine in the course of my care at any time, without affecting my right to future care or treatment, and that the Practitioner or I may terminate the telemedicine visit at any time. I understand that I have the right to inspect all information obtained and/or recorded in the course of the telemedicine visit and may receive copies of available information for a reasonable fee.  I understand that some of the potential risks of receiving the Services via telemedicine include:  Marland Kitchen Delay or interruption in medical evaluation due to technological equipment failure or disruption; . Information transmitted may not be sufficient (e.g. poor resolution of images) to allow for appropriate medical decision making by the Practitioner;  and/or  . In rare instances, security protocols could fail, causing a breach of personal health information.  Furthermore, I acknowledge that it is my responsibility to provide information about my medical history, conditions and care that is complete and accurate to the best of my ability. I acknowledge that Practitioner's advice, recommendations, and/or decision may be based on factors not within their control, such as incomplete or inaccurate data provided by me or distortions of diagnostic images or specimens that may result from electronic transmissions. I understand that the practice of medicine is not an exact science and that Practitioner makes no warranties or guarantees regarding treatment outcomes. I acknowledge that a copy of this consent can be made available to me via my patient portal ([facility]), or I can request a printed copy by calling the office of [facility].    I understand that my insurance will be billed for this visit.   I have read or had this consent read to me. . I understand the contents of this consent, which adequately explains the benefits and risks of the Services being provided via telemedicine.  . I have been provided ample opportunity to ask questions regarding this consent and the Services and have had my questions answered to my satisfaction. . I give my informed consent for the services to be provided through the use of telemedicine in my medical care

## 2020-07-21 DIAGNOSIS — Z8579 Personal history of other malignant neoplasms of lymphoid, hematopoietic and related tissues: Secondary | ICD-10-CM | POA: Diagnosis not present

## 2020-07-21 DIAGNOSIS — N183 Chronic kidney disease, stage 3 unspecified: Secondary | ICD-10-CM | POA: Diagnosis not present

## 2020-07-21 DIAGNOSIS — I10 Essential (primary) hypertension: Secondary | ICD-10-CM | POA: Diagnosis not present

## 2020-07-30 NOTE — Progress Notes (Signed)
Virtual Visit via Telephone Note   This visit type was conducted due to national recommendations for restrictions regarding the COVID-19 Pandemic (e.g. social distancing) in an effort to limit this patient's exposure and mitigate transmission in our community.  Due to her co-morbid illnesses, this patient is at least at moderate risk for complications without adequate follow up.  This format is felt to be most appropriate for this patient at this time.  The patient did not have access to video technology/had technical difficulties with video requiring transitioning to audio format only (telephone).  All issues noted in this document were discussed and addressed.  No physical exam could be performed with this format.  Please refer to the patient's chart for her  consent to telehealth for [facility].    Date:  08/04/2020   ID:  Cristina Garcia, DOB [birth year], MRN [id number] The patient was identified using 2 identifiers.  Patient Location: Home Provider Location: Office/Clinic   PCP:  Caren Macadam, MD   [place]  Cardiologist:  Ena Dawley, MD  To see Dr. Johney Frame in f/u Advanced Practice Provider:  No care team member to display Electrophysiologist:  None   :[id number]}   Evaluation Performed:  Follow-Up Visit  Chief Complaint:  Follow  History of Present Illness:    Cristina Garcia is a 60 y.o. female with  with history of severe MR, normal Coronaries on cath, S/P minimally invasive MV repair 2017. Post op required right thoracentesis. EF post op 11/2015 EF 45%, mild L dilation, stable MV repair, f/u echo 05/2018 LVEF 50-55%.   I saw patient 07/03/19 for yearly echo which was unchanged and she was doing well.  Echo 06/23/20 shows slight progression of MR. Mean gradient went from 6 to 7 mmHg, moderate MR, mild to mod MS. Patient works in [employer].Has had a very stressful year and hasn't had time to exercise and cook like  she had before.Working a lot.Denies chest pain, dyspnea, edema, dizziness. When she lays down at night she is aware of her heart beat. No racing or skipping, just aware of it.   The patient does not have symptoms concerning for COVID-19 infection (fever, chills, cough, or new shortness of breath).    Past Medical History:  Diagnosis Date  . Allergy   . Anxiety   . Atrial tachycardia ([place]) 12/18/2014   24 hour event monitor with frequent PAC's, PVC's and short runs of atrial tachycardia - no sustained arrhythmias or atrial fibrillation   . Chronic kidney disease (CKD)   . Constipation   . H/O multiple myeloma    clinical remission  . Hypertension   . Mitral regurgitation   . Mitral valve prolapse   . Osteoporosis   . Pleural effusion, right 11/03/2015  . Pleural effusion, right 11/03/2015  . S/P minimally invasive mitral valve repair 10/15/2015   Complex valvuloplasty including Alfieri edge-to-edge repair, generous quadrangular resection (shortening) of posterior leaflet, sliding leaflet plasty, Gore-tex neochord placement x6, and 38 mm Edwards Physio II ring annuloplasty via right mini thoracotomy approach    Past Surgical History:  Procedure Laterality Date  . BREAST ENHANCEMENT SURGERY  2000  . CARDIAC CATHETERIZATION N/A 08/18/2015   Procedure: Right/Left Heart Cath and Coronary Angiography;  Surgeon: Larey Dresser, MD;  Location: [place] [facility];  Service: Cardiovascular;  Laterality: N/A;  . [place]  . HAMMERTOE RECONSTRUCTION WITH WEIL OSTEOTOMY Left 09/09/2016   Procedure: Left 2-4 MT  Weil osteotomies; Left 2nd hammertoe correction and extensor tendon lengthing;  Surgeon: Wylene Simmer, MD;  Location: [place];  Service: Orthopedics;  Laterality: Left;  . LIMBAL STEM CELL TRANSPLANT  2008  . MITRAL VALVE REPAIR Right 10/15/2015   Procedure: MINIMALLY INVASIVE MITRAL VALVE REPAIR (MVR);  Surgeon: Rexene Alberts, MD;  Location: [place];  Service: Open  Heart Surgery;  Laterality: Right;  . OOPHORECTOMY  1991   left; got cyst on ovary with torsion when pregnant.  . TEE WITHOUT CARDIOVERSION N/A 09/05/2013   Procedure: TRANSESOPHAGEAL ECHOCARDIOGRAM (TEE);  Surgeon: Larey Dresser, MD;  Location: [place];  Service: Cardiovascular;  Laterality: N/A;  . TEE WITHOUT CARDIOVERSION N/A 08/18/2015   Procedure: TRANSESOPHAGEAL ECHOCARDIOGRAM (TEE);  Surgeon: Larey Dresser, MD;  Location: [place];  Service: Cardiovascular;  Laterality: N/A;  . TEE WITHOUT CARDIOVERSION N/A 10/15/2015   Procedure: TRANSESOPHAGEAL ECHOCARDIOGRAM (TEE);  Surgeon: Rexene Alberts, MD;  Location: [place];  Service: Open Heart Surgery;  Laterality: N/A;  . WISDOM TOOTH EXTRACTION  1975     Current Meds  Medication Sig  . Ascorbic Acid (VITAMIN C) 500 MG CAPS Take 500 mg by mouth daily.  Marland Kitchen aspirin 81 MG tablet Take 81 mg by mouth daily. Reported on 10/08/2015  . cetirizine (ZYRTEC) 10 MG tablet Take 10 mg by mouth daily.  Marland Kitchen lisinopril (ZESTRIL) 5 MG tablet TAKE 1 TABLET BY MOUTH EVERY DAY IN THE MORNING  . magnesium oxide (MAG-OX) 400 MG tablet Take 400 mg by mouth daily.  . metoprolol succinate (TOPROL-XL) 25 MG 24 hr tablet Take 0.5 tablets (12.5 mg total) by mouth daily.     Allergies:   Patient has no known allergies.   Social History   Tobacco Use  . Smoking status: Never Smoker  . Smokeless tobacco: Never Used  Substance Use Topics  . Alcohol use: Yes    Alcohol/week: 2.0 standard drinks    Types: 2 Glasses of wine per week    Comment: socially  . Drug use: No     Family Hx: The patient's family history includes Congestive Heart Failure in her mother; Diabetes in her mother and another family member; Hypertension in her father and another family member; Lymphoma in an other family member; Non-Hodgkin's lymphoma in her mother; Other in her maternal grandmother; Renal cancer in her paternal grandmother; Rheumatic fever in her mother. There is no  history of Colon cancer or Stomach cancer.  ROS:   Please see the history of present illness.      All other systems reviewed and are negative.   Prior CV studies:   The following studies were reviewed today:  2D echo 06/23/2020 FINDINGS   Left Ventricle: Left ventricular ejection fraction, by estimation, is 60  to 65%. The left ventricle has normal function. The left ventricle has no  regional wall motion abnormalities. The left ventricular internal cavity  size was normal in size. There is   no left ventricular hypertrophy. Abnormal (paradoxical) septal motion  consistent with post-operative status. Left ventricular diastolic  parameters are indeterminate.   Right Ventricle: The right ventricular size is normal. Right vetricular  wall thickness was not well visualized. Right ventricular systolic  function is normal. There is normal pulmonary artery systolic pressure.  The tricuspid regurgitant velocity is 2.65  m/s, and with an assumed right atrial pressure of 3 mmHg, the estimated  right ventricular systolic pressure is 84.6 mmHg.   Left Atrium: Left atrial size was  mildly dilated.   Right Atrium: Right atrial size was normal in size.   Pericardium: There is no evidence of pericardial effusion.   Mitral Valve: The mitral valve has been repaired/replaced. Moderate mitral  valve regurgitation. There is a 38 prosthetic annuloplasty ring present in  the mitral position. Procedure Date: 10/15/15. Mild to moderate mitral valve  stenosis. MV peak gradient,  15.4 mmHg. The mean mitral valve gradient is 7.0 mmHg.   Tricuspid Valve: The tricuspid valve is grossly normal. Tricuspid valve  regurgitation is mild.   Aortic Valve: The aortic valve is grossly normal. Aortic valve  regurgitation is not visualized. No aortic stenosis is present.   Pulmonic Valve: The pulmonic valve was grossly normal. Pulmonic valve  regurgitation is trivial.   Aorta: The aortic root and ascending  aorta are structurally normal, with  no evidence of dilitation.   Venous: A systolic blunting flow pattern is recorded from the right upper  pulmonary vein. The inferior vena cava is normal in size with greater than  50% respiratory variability, suggesting right atrial pressure of 3 mmHg.   IAS/Shunts: The atrial septum is grossly normal.     LEFT VENTRICLE  PLAX 2D  LVIDd:         4.50 cm  Diastology  LVIDs:         2.90 cm  LV e' medial:    2.75 cm/s  LV PW:         1.00 cm  LV E/e' medial:  63.3  LV IVS:        0.60 cm  LV e' lateral:   3.64 cm/s  LVOT diam:     2.00 cm  LV E/e' lateral: 47.8  LV SV:         54  LV SV Index:   31       2D Longitudinal Strain  LVOT Area:     3.14 cm 2D Strain GLS (A2C):   -15.4 %                          2D Strain GLS (A3C):   -24.2 %                          2D Strain GLS (A4C):   -15.8 %                          2D Strain GLS Avg:     -18.5 %                            3D Volume EF:                          3D EF:        66 %                          LV EDV:       86 ml                          LV ESV:       30 ml  LV SV:        56 ml   RIGHT VENTRICLE  RV Basal diam:  3.30 cm  RV S prime:     9.83 cm/s  TAPSE (M-mode): 1.8 cm  RVSP:           31.1 mmHg   LEFT ATRIUM             Index       RIGHT ATRIUM           Index  LA diam:        3.40 cm 1.93 cm/m  RA Pressure: 3.00 mmHg  LA Vol (A2C):   47.0 ml 26.67 ml/m RA Area:     15.30 cm  LA Vol (A4C):   45.6 ml 25.87 ml/m RA Volume:   37.90 ml  21.50 ml/m  LA Biplane Vol: 49.9 ml 28.31 ml/m   AORTIC VALVE  LVOT Vmax:   80.30 cm/s  LVOT Vmean:  53.000 cm/s  LVOT VTI:    0.173 m    AORTA  Ao Root diam: 3.30 cm  Ao Asc diam:  3.10 cm   MITRAL VALVE                 TRICUSPID VALVE  MV Area (PHT): 3.12 cm      TR Peak grad:   28.1 mmHg  MV Peak grad:  15.4 mmHg     TR Vmax:        265.00 cm/s  MV Mean grad:  7.0 mmHg      Estimated RAP:  3.00 mmHg  MV  Vmax:       1.96 m/s      RVSP:           31.1 mmHg  MV Vmean:      123.0 cm/s  MV Decel Time: 243 msec      SHUNTS  MR Peak grad:    103.6 mmHg  Systemic VTI:  0.17 m  MR Mean grad:    67.0 mmHg   Systemic Diam: 2.00 cm  MR Vmax:         509.00 cm/s  MR Vmean:        388.0 cm/s  MR PISA:         3.08 cm  MR PISA Eff ROA: 19 mm  MR PISA Radius:  0.70 cm  MV E velocity: 174.00 cm/s  MV A velocity: 119.00 cm/s  MV E/A ratio:  1.46   Rudean Haskell MD  Electronically signed by Rudean Haskell MD  Signature Date/Time: 06/23/2020/12:57:55 PM        Final     Labs/Other Tests and Data Reviewed:    EKG:  No ECG reviewed.  Recent Labs: 05/19/2020: Hemoglobin 16.1; Platelets 173 05/26/2020: ALT 13; BUN 27; Creatinine 1.29; Potassium 4.0; Sodium 139   Recent Lipid Panel Lab Results  Component Value Date/Time   CHOL 251 (H) 05/19/2020 10:21 AM   TRIG 83 05/19/2020 10:21 AM   HDL 100 05/19/2020 10:21 AM   CHOLHDL 2.5 05/19/2020 10:21 AM   LDLCALC 133 (H) 05/19/2020 10:21 AM   LDLDIRECT 104.4 08/13/2010 08:35 AM    Wt Readings from Last 3 Encounters:  08/04/20 138 lb (62.6 kg)  05/26/20 136 lb 12.8 oz (62.1 kg)  05/19/20 136 lb 4.8 oz (61.8 kg)     Risk Assessment/Calculations:      Objective:    Vital Signs:  Ht '5\' 9"'  (1.753 m)   Wt 138 lb (62.6  kg)   BMI 20.38 kg/m   BP 95/68 05/26/21  VITAL SIGNS:  reviewed  ASSESSMENT & PLAN:    Status post minimally invasive mitral valve repair 2017.  Echo 06/23/2020  normal LV function with slight increase in MR now moderate mean mitral valve gradient went from 6 mmHg to 91mHg .Reviewed with patient. Needs yearly echos. Doing well without symptoms. Needs to start exercising again. F/u echo next year and appt to be established with Dr.Pemberton.   HTN BP controlled on lisinopril and toprol-tends to run low.   Hyperlipidemia- LDL 133 Chol 251, HDL 100. No family history of CAD. Hasn't been eating well. She'll try  harder.          COVID-19 Education: The signs and symptoms of COVID-19 were discussed with the patient and how to seek care for testing (follow up with PCP or arrange E-visit).   The importance of social distancing was discussed today.  Time:   Today, I have spent 22:30 minutes with the patient with telehealth technology discussing the above problems.     Medication Adjustments/Labs and Tests Ordered: Current medicines are reviewed at length with the patient today.  Concerns regarding medicines are outlined above.   Tests Ordered: Orders Placed This Encounter  Procedures  . ECHOCARDIOGRAM COMPLETE    Medication Changes: No orders of the defined types were placed in this encounter.   Follow Up:  In Person in 1 year(s)  Signed, MErmalinda Barrios PA-C  08/04/2020 2:32 PM    [place]

## 2020-08-04 ENCOUNTER — Encounter: Payer: Self-pay | Admitting: Physician Assistant

## 2020-08-04 ENCOUNTER — Telehealth (INDEPENDENT_AMBULATORY_CARE_PROVIDER_SITE_OTHER): Payer: BC Managed Care – PPO | Admitting: Physician Assistant

## 2020-08-04 ENCOUNTER — Other Ambulatory Visit: Payer: Self-pay

## 2020-08-04 VITALS — Ht 69.0 in | Wt 138.0 lb

## 2020-08-04 DIAGNOSIS — Z9889 Other specified postprocedural states: Secondary | ICD-10-CM | POA: Diagnosis not present

## 2020-08-04 DIAGNOSIS — E785 Hyperlipidemia, unspecified: Secondary | ICD-10-CM

## 2020-08-04 DIAGNOSIS — I10 Essential (primary) hypertension: Secondary | ICD-10-CM

## 2020-08-04 NOTE — Patient Instructions (Signed)
Medication Instructions:  Your physician recommends that you continue on your current medications as directed. Please refer to the Current Medication list given to you today.  *If you need a refill on your cardiac medications before your next appointment, please call your pharmacy*   Lab Work: None  If you have labs (blood work) drawn today and your tests are completely normal, you will receive your results only by: Marland Kitchen MyChart Message (if you have MyChart) OR . A paper copy in the mail If you have any lab test that is abnormal or we need to change your treatment, we will call you to review the results.   Testing/Procedures: None    Follow-Up: At [facility], you and your health needs are our priority.  As part of our continuing mission to provide you with exceptional heart care, we have created designated Provider Care Teams.  These Care Teams include your primary Cardiologist (physician) and Advanced Practice Providers (APPs -  Physician Assistants and Nurse Practitioners) who all work together to provide you with the care you need, when you need it.  We recommend signing up for the patient portal called "MyChart".  Sign up information is provided on this After Visit Summary.  MyChart is used to connect with patients for Virtual Visits (Telemedicine).  Patients are able to view lab/test results, encounter notes, upcoming appointments, etc.  Non-urgent messages can be sent to your provider as well.   To learn more about what you can do with MyChart, go to [email or web address].    Your next appointment:   1 year(s)  The format for your next appointment:   In Person  Provider:   Gwyndolyn Kaufman, MD   Other Instructions

## 2020-08-21 ENCOUNTER — Other Ambulatory Visit: Payer: Self-pay | Admitting: Cardiology

## 2020-08-22 DIAGNOSIS — D485 Neoplasm of uncertain behavior of skin: Secondary | ICD-10-CM | POA: Diagnosis not present

## 2020-08-22 DIAGNOSIS — L821 Other seborrheic keratosis: Secondary | ICD-10-CM | POA: Diagnosis not present

## 2020-08-22 DIAGNOSIS — L820 Inflamed seborrheic keratosis: Secondary | ICD-10-CM | POA: Diagnosis not present

## 2020-08-22 DIAGNOSIS — L814 Other melanin hyperpigmentation: Secondary | ICD-10-CM | POA: Diagnosis not present

## 2020-08-22 DIAGNOSIS — D225 Melanocytic nevi of trunk: Secondary | ICD-10-CM | POA: Diagnosis not present

## 2020-08-30 ENCOUNTER — Emergency Department (HOSPITAL_BASED_OUTPATIENT_CLINIC_OR_DEPARTMENT_OTHER)
Admission: EM | Admit: 2020-08-30 | Discharge: 2020-08-30 | Disposition: A | Discharge status: Home / Self Care | Payer: BC Managed Care – PPO | Attending: Emergency Medicine | Admitting: Emergency Medicine

## 2020-08-30 ENCOUNTER — Emergency Department (HOSPITAL_BASED_OUTPATIENT_CLINIC_OR_DEPARTMENT_OTHER): Payer: BC Managed Care – PPO

## 2020-08-30 ENCOUNTER — Other Ambulatory Visit: Payer: Self-pay

## 2020-08-30 ENCOUNTER — Encounter (HOSPITAL_BASED_OUTPATIENT_CLINIC_OR_DEPARTMENT_OTHER): Payer: Self-pay

## 2020-08-30 DIAGNOSIS — Y9301 Activity, walking, marching and hiking: Secondary | ICD-10-CM | POA: Insufficient documentation

## 2020-08-30 DIAGNOSIS — M25572 Pain in left ankle and joints of left foot: Secondary | ICD-10-CM

## 2020-08-30 DIAGNOSIS — S99922A Unspecified injury of left foot, initial encounter: Secondary | ICD-10-CM | POA: Diagnosis not present

## 2020-08-30 DIAGNOSIS — Z79899 Other long term (current) drug therapy: Secondary | ICD-10-CM | POA: Insufficient documentation

## 2020-08-30 DIAGNOSIS — N189 Chronic kidney disease, unspecified: Secondary | ICD-10-CM | POA: Insufficient documentation

## 2020-08-30 DIAGNOSIS — S93492A Sprain of other ligament of left ankle, initial encounter: Secondary | ICD-10-CM | POA: Diagnosis not present

## 2020-08-30 DIAGNOSIS — Z7982 Long term (current) use of aspirin: Secondary | ICD-10-CM | POA: Diagnosis not present

## 2020-08-30 DIAGNOSIS — S0093XA Contusion of unspecified part of head, initial encounter: Secondary | ICD-10-CM | POA: Diagnosis not present

## 2020-08-30 DIAGNOSIS — S0990XA Unspecified injury of head, initial encounter: Secondary | ICD-10-CM | POA: Diagnosis not present

## 2020-08-30 DIAGNOSIS — I129 Hypertensive chronic kidney disease with stage 1 through stage 4 chronic kidney disease, or unspecified chronic kidney disease: Secondary | ICD-10-CM | POA: Insufficient documentation

## 2020-08-30 DIAGNOSIS — S99912A Unspecified injury of left ankle, initial encounter: Secondary | ICD-10-CM | POA: Diagnosis not present

## 2020-08-30 DIAGNOSIS — W108XXA Fall (on) (from) other stairs and steps, initial encounter: Secondary | ICD-10-CM | POA: Diagnosis not present

## 2020-08-30 DIAGNOSIS — M79672 Pain in left foot: Secondary | ICD-10-CM | POA: Diagnosis not present

## 2020-08-30 NOTE — ED Notes (Signed)
ED Provider at bedside. 

## 2020-08-30 NOTE — ED Notes (Signed)
Portable xray at bedside.

## 2020-08-30 NOTE — ED Triage Notes (Signed)
Pt reports that she rolled her ankle on the last step of the stairs.

## 2020-08-30 NOTE — ED Provider Notes (Signed)
[facility] DEPT Provider Note   CSN: [id number] Arrival date & time: 08/30/20  1058     History Chief Complaint  Patient presents with  . Ankle Pain    Cristina Garcia is a 60 y.o. female.  HPI     60yo female with history of atrial tachycardia, CKD, multiple myeloma, hypertension, pleural effusion, s'p minimally invasive valve repair, hammertoe reconstruction with [facility] presents with concern for left ankle pain after rolling her foot last night.  Last night was walking down the stairs when she stepped wrong off of the last year, twisting her left ankle and falling backwards.  Has had pain to the left ankle, and top of left foot since last night.  She has been unable to bear weight due to pain, and has been hopping around on it.  She initially had hoped that symptoms would improve overnight, and wanted to avoid the emergency department last night, but presents this morning with concern for continuing symptoms.  Denies numbness, tingling, loss of consciousness, headache, head trauma, neck pain, new back pain, arm pain or other injuries.  Has a history of multiple myeloma and some tender spots related to this which are unchanged.  Pain is currently a 5 out of 10.  She took Advil this morning at 4:30 AM. After sitting in ED did note back of head has area of tenderness, again denies headache, nausea, vomiting or other concerns. Is on aspirin but no other anticoagulation.    Past Medical History:  Diagnosis Date  . Allergy   . Anxiety   . Atrial tachycardia ([place]) 12/18/2014   24 hour event monitor with frequent PAC's, PVC's and short runs of atrial tachycardia - no sustained arrhythmias or atrial fibrillation   . Chronic kidney disease (CKD)   . Constipation   . H/O multiple myeloma    clinical remission  . Hypertension   . Mitral regurgitation   . Mitral valve prolapse   . Osteoporosis   . Pleural effusion, right 11/03/2015  . Pleural effusion, right  11/03/2015  . S/P minimally invasive mitral valve repair 10/15/2015   Complex valvuloplasty including Alfieri edge-to-edge repair, generous quadrangular resection (shortening) of posterior leaflet, sliding leaflet plasty, Gore-tex neochord placement x6, and 38 mm Edwards Physio II ring annuloplasty via right mini thoracotomy approach     Patient Active Problem List   Diagnosis Date Noted  . S/P minimally invasive mitral valve repair 10/15/2015  . Chronic kidney disease (CKD)   . Atrial tachycardia ([place]) 12/12/2014  . Left foot pain 11/05/2014  . Mitral regurgitation 11/09/2013  . Chest wall pain 07/24/2013  . Reactive airway disease 07/24/2013  . Prolapse of mitral valve 06/21/2013  . Osteoporosis 12/24/2008  . Essential hypertension 11/06/2008  . MULTIPLE MYELOMA, IN REMISSION 03/13/2007  . Allergies 01/13/2007    Past Surgical History:  Procedure Laterality Date  . BREAST ENHANCEMENT SURGERY  2000  . CARDIAC CATHETERIZATION N/A 08/18/2015   Procedure: Right/Left Heart Cath and Coronary Angiography;  Surgeon: Larey Dresser, MD;  Location: [facility];  Service: Cardiovascular;  Laterality: N/A;  . [place]  . HAMMERTOE RECONSTRUCTION WITH WEIL OSTEOTOMY Left 09/09/2016   Procedure: Left 2-4 MT Weil osteotomies; Left 2nd hammertoe correction and extensor tendon lengthing;  Surgeon: Wylene Simmer, MD;  Location: [facility];  Service: Orthopedics;  Laterality: Left;  . LIMBAL STEM CELL TRANSPLANT  2008  . MITRAL VALVE REPAIR Right 10/15/2015   Procedure: MINIMALLY INVASIVE  MITRAL VALVE REPAIR (MVR);  Surgeon: Rexene Alberts, MD;  Location: [facility];  Service: Open Heart Surgery;  Laterality: Right;  . OOPHORECTOMY  1991   left; got cyst on ovary with torsion when pregnant.  . TEE WITHOUT CARDIOVERSION N/A 09/05/2013   Procedure: TRANSESOPHAGEAL ECHOCARDIOGRAM (TEE);  Surgeon: Larey Dresser, MD;  Location: [facility];  Service: Cardiovascular;  Laterality:  N/A;  . TEE WITHOUT CARDIOVERSION N/A 08/18/2015   Procedure: TRANSESOPHAGEAL ECHOCARDIOGRAM (TEE);  Surgeon: Larey Dresser, MD;  Location: [facility];  Service: Cardiovascular;  Laterality: N/A;  . TEE WITHOUT CARDIOVERSION N/A 10/15/2015   Procedure: TRANSESOPHAGEAL ECHOCARDIOGRAM (TEE);  Surgeon: Rexene Alberts, MD;  Location: [facility];  Service: Open Heart Surgery;  Laterality: N/A;  . [place]     OB History   No obstetric history on file.     Family History  Problem Relation Age of Onset  . Non-Hodgkin's lymphoma Mother   . Congestive Heart Failure Mother   . Rheumatic fever Mother        with mitral valve replacment  . Diabetes Mother   . Hypertension Father   . Diabetes Other        family hx  . Hypertension Other        family hx  . Lymphoma Other        family hx  . Other Maternal Grandmother        died at 8; no known med problems  . Renal cancer Paternal Grandmother   . Colon cancer Neg Hx   . Stomach cancer Neg Hx     Social History   Tobacco Use  . Smoking status: Never Smoker  . Smokeless tobacco: Never Used  Substance Use Topics  . Alcohol use: Yes    Alcohol/week: 2.0 standard drinks    Types: 2 Glasses of wine per week    Comment: socially  . Drug use: No    Home Medications Prior to Admission medications   Medication Sig Start Date End Date Taking? Authorizing Provider  Ascorbic Acid (VITAMIN C) 500 MG CAPS Take 500 mg by mouth daily.    [provider]  aspirin 81 MG tablet Take 81 mg by mouth daily. Reported on 10/08/2015    [provider]  cetirizine (ZYRTEC) 10 MG tablet Take 10 mg by mouth daily.    [provider]  lisinopril (ZESTRIL) 5 MG tablet TAKE 1 TABLET BY MOUTH EVERY DAY IN THE MORNING 02/20/20   Koberlein, Andris Flurry C, MD  magnesium oxide (MAG-OX) 400 MG tablet Take 400 mg by mouth daily.    [provider]  metoprolol succinate (TOPROL-XL) 25 MG 24 hr tablet TAKE 1/2 TABLET BY  MOUTH EVERY DAY 08/21/20   Dorothy Spark, MD  Multiple Vitamins-Minerals (MULTIVITAL PO) Take 1 tablet by mouth daily.    [provider]    Allergies    Patient has no known allergies.  Review of Systems   Review of Systems  Constitutional: Negative for fever.  Cardiovascular: Negative for chest pain.  Gastrointestinal: Negative for abdominal pain, nausea and vomiting.  Musculoskeletal: Positive for arthralgias and gait problem. Negative for back pain (no change in prior pain) and neck pain.  Skin: Negative for rash.  Neurological: Negative for syncope and headaches.    Physical Exam Updated Vital Signs BP 101/74   Pulse 89   Temp 98.4 F (36.9 C) (Oral)   Ht _0  (1.753 m)  Wt 61.2 kg   SpO2 100%   BMI 19.94 kg/m   Physical Exam Vitals and nursing note reviewed.  Constitutional:      General: She is not in acute distress.    Appearance: Normal appearance. She is not ill-appearing, toxic-appearing or diaphoretic.  HENT:     Head: Normocephalic.     Comments: Hematoma back of head Eyes:     Conjunctiva/sclera: Conjunctivae normal.  Cardiovascular:     Rate and Rhythm: Normal rate and regular rhythm.     Pulses: Normal pulses.  Pulmonary:     Effort: Pulmonary effort is normal. No respiratory distress.  Musculoskeletal:        General: Tenderness present. No deformity or signs of injury.     Cervical back: No rigidity.     Right ankle: Normal pulse.     Left ankle: Swelling and ecchymosis present. Tenderness present over the AITF ligament and base of 5th metatarsal. No medial malleolus or proximal fibula tenderness. Decreased range of motion. Normal pulse.     Comments: Healed surgical scars distal foot, limited flexion of toes baseline per pt  Skin:    General: Skin is warm and dry.     Coloration: Skin is not jaundiced or pale.  Neurological:     General: No focal deficit present.     Mental Status: She is alert and oriented to person, place, and  time.     GCS: GCS eye subscore is 4. GCS verbal subscore is 5. GCS motor subscore is 6.     Cranial Nerves: No cranial nerve deficit, dysarthria or facial asymmetry.     Sensory: Sensation is intact. No sensory deficit.     Motor: No weakness.     ED Results / Procedures / Treatments   Labs (all labs ordered are listed, but only abnormal results are displayed) Labs Reviewed - No data to display  EKG None  Radiology DG Ankle Complete Left  Result Date: 08/30/2020 CLINICAL DATA:  Status post trauma to the left ankle with pain. EXAM: LEFT ANKLE COMPLETE - 3+ VIEW COMPARISON:  None. FINDINGS: There is no evidence of fracture, dislocation, or joint effusion. There is no evidence of arthropathy or other focal bone abnormality. Soft tissues are unremarkable. IMPRESSION: Negative. Electronically Signed   By: Abelardo Diesel M.D.   On: 08/30/2020 12:02   DG Foot Complete Left  Result Date: 08/30/2020 CLINICAL DATA:  Status post injury with left foot pain. EXAM: LEFT FOOT - COMPLETE 3+ VIEW COMPARISON:  None. FINDINGS: There is no evidence of fracture or dislocation. Patient status post prior fixation of the second, third and fourth digits. The soft tissues are normal. IMPRESSION: No acute fracture or dislocation. Electronically Signed   By: Abelardo Diesel M.D.   On: 08/30/2020 12:03    Procedures Procedures   Medications Ordered in ED Medications - No data to display  ED Course  I have reviewed the triage vital signs and the nursing notes.  Pertinent labs & imaging results that were available during my care of the patient were reviewed by me and considered in my medical decision making (see chart for details).    MDM Rules/Calculators/A&P                          60yo female with history of atrial tachycardia, CKD, multiple myeloma, hypertension, pleural effusion, s'p minimally invasive valve repair, hammertoe reconstruction with [facility] presents with concern for left ankle pain  after rolling her foot last night.  Does note hematoma back of head, low suspicion for intracranial injury by canadian head CT rules. No other injuries by history or exam.  NV intact.  XR shows no fracture of ankle or foot.  Suspect ankle sprain. Has crutches, given ankle brace, recommend tylenol, ice, elevation and outpatient follow up. Patient discharged in stable condition with understanding of reasons to return.    Final Clinical Impression(s) / ED Diagnoses Final diagnoses:  Acute left ankle pain  Sprain of anterior talofibular ligament of left ankle, initial encounter    Rx / DC Orders ED Discharge Orders    None       Gareth Morgan, MD 08/30/20 1213

## 2020-09-03 ENCOUNTER — Other Ambulatory Visit: Payer: Self-pay | Admitting: Family Medicine

## 2020-09-03 DIAGNOSIS — I10 Essential (primary) hypertension: Secondary | ICD-10-CM

## 2020-09-15 ENCOUNTER — Encounter: Payer: Self-pay | Admitting: Oncology

## 2020-10-22 ENCOUNTER — Ambulatory Visit: Payer: BC Managed Care – PPO | Admitting: Family Medicine

## 2020-10-22 ENCOUNTER — Encounter: Payer: Self-pay | Admitting: Family Medicine

## 2020-10-22 ENCOUNTER — Other Ambulatory Visit: Payer: Self-pay

## 2020-10-22 VITALS — BP 98/68 | HR 80 | Temp 98.2°F | Wt 135.0 lb

## 2020-10-22 DIAGNOSIS — R319 Hematuria, unspecified: Secondary | ICD-10-CM | POA: Diagnosis not present

## 2020-10-22 DIAGNOSIS — N390 Urinary tract infection, site not specified: Secondary | ICD-10-CM

## 2020-10-22 LAB — POC URINALSYSI DIPSTICK (AUTOMATED)
Glucose, UA: NEGATIVE
Ketones, UA: NEGATIVE
Protein, UA: POSITIVE — AB
Spec Grav, UA: 1.015 (ref 1.010–1.025)
Urobilinogen, UA: 2 E.U./dL — AB
pH, UA: 6 (ref 5.0–8.0)

## 2020-10-22 MED ORDER — CIPROFLOXACIN HCL 500 MG PO TABS: 500.0000 mg | ORAL_TABLET | Freq: Two times a day (BID) | ORAL | 0 refills | Status: DC

## 2020-10-22 NOTE — Progress Notes (Signed)
   Subjective:    Patient ID: Cristina Garcia, female    DOB: 1961/02/22, 60 y.o.   MRN: [id number]  HPI Here for 2 days of urinary burning and urgency. No back pain or fever. Drinking lots of water. Using AZO. She thinks this came from swimming in a swimming pool this past weekend when she attended a wedding.    Review of Systems  Constitutional: Negative.   Respiratory: Negative.   Cardiovascular: Negative.   Genitourinary: Positive for dysuria, frequency and urgency. Negative for flank pain and hematuria.       Objective:   Physical Exam Constitutional:      Appearance: Normal appearance.  Cardiovascular:     Rate and Rhythm: Normal rate and regular rhythm.     Pulses: Normal pulses.     Heart sounds: Normal heart sounds.  Pulmonary:     Effort: Pulmonary effort is normal.     Breath sounds: Normal breath sounds.  Abdominal:     General: Abdomen is flat. Bowel sounds are normal. There is no distension.     Palpations: Abdomen is soft. There is no mass.     Tenderness: There is no abdominal tenderness. There is no right CVA tenderness, left CVA tenderness, guarding or rebound.     Hernia: No hernia is present.  Neurological:     Mental Status: She is alert.           Assessment & Plan:  UTI, treat with Cipro. Culture the sample.  Alysia Penna, MD

## 2020-10-24 LAB — URINE CULTURE
MICRO NUMBER:: 11877326
SPECIMEN QUALITY:: ADEQUATE

## 2020-11-08 ENCOUNTER — Encounter: Payer: Self-pay | Admitting: Oncology

## 2020-11-11 DIAGNOSIS — Z20822 Contact with and (suspected) exposure to covid-19: Secondary | ICD-10-CM | POA: Diagnosis not present

## 2020-11-24 ENCOUNTER — Inpatient Hospital Stay: Payer: BC Managed Care – PPO

## 2020-11-24 ENCOUNTER — Other Ambulatory Visit: Payer: Self-pay

## 2020-11-24 ENCOUNTER — Inpatient Hospital Stay: Payer: BC Managed Care – PPO | Attending: Oncology | Admitting: Oncology

## 2020-11-24 ENCOUNTER — Other Ambulatory Visit: Payer: BC Managed Care – PPO

## 2020-11-24 ENCOUNTER — Ambulatory Visit: Payer: BC Managed Care – PPO | Admitting: Oncology

## 2020-11-24 VITALS — BP 112/72 | HR 75 | Temp 97.7°F | Resp 18 | Ht 69.0 in | Wt 135.0 lb

## 2020-11-24 DIAGNOSIS — C9001 Multiple myeloma in remission: Secondary | ICD-10-CM | POA: Diagnosis not present

## 2020-11-24 DIAGNOSIS — C9000 Multiple myeloma not having achieved remission: Secondary | ICD-10-CM | POA: Diagnosis not present

## 2020-11-24 LAB — CBC WITH DIFFERENTIAL (CANCER CENTER ONLY)
Abs Immature Granulocytes: 0.01 10*3/uL (ref 0.00–0.07)
Basophils Absolute: 0 10*3/uL (ref 0.0–0.1)
Basophils Relative: 1 %
Eosinophils Absolute: 0.1 10*3/uL (ref 0.0–0.5)
Eosinophils Relative: 2 %
HCT: 43.7 % (ref 36.0–46.0)
Hemoglobin: 14.8 g/dL (ref 12.0–15.0)
Immature Granulocytes: 0 %
Lymphocytes Relative: 30 %
Lymphs Abs: 1.2 10*3/uL (ref 0.7–4.0)
MCH: 32.3 pg (ref 26.0–34.0)
MCHC: 33.9 g/dL (ref 30.0–36.0)
MCV: 95.4 fL (ref 80.0–100.0)
Monocytes Absolute: 0.4 10*3/uL (ref 0.1–1.0)
Monocytes Relative: 9 %
Neutro Abs: 2.4 10*3/uL (ref 1.7–7.7)
Neutrophils Relative %: 58 %
Platelet Count: 168 10*3/uL (ref 150–400)
RBC: 4.58 MIL/uL (ref 3.87–5.11)
RDW: 12.5 % (ref 11.5–15.5)
WBC Count: 4.1 10*3/uL (ref 4.0–10.5)
nRBC: 0 % (ref 0.0–0.2)

## 2020-11-24 LAB — CMP (CANCER CENTER ONLY)
ALT: 13 U/L (ref 0–44)
AST: 19 U/L (ref 15–41)
Albumin: 4.2 g/dL (ref 3.5–5.0)
Alkaline Phosphatase: 50 U/L (ref 38–126)
Anion gap: 7 (ref 5–15)
BUN: 22 mg/dL (ref 8–23)
CO2: 27 mmol/L (ref 22–32)
Calcium: 9.8 mg/dL (ref 8.9–10.3)
Chloride: 103 mmol/L (ref 98–111)
Creatinine: 1.2 mg/dL — ABNORMAL HIGH (ref 0.44–1.00)
GFR, Estimated: 52 mL/min — ABNORMAL LOW (ref >60)
Glucose, Bld: 76 mg/dL (ref 70–99)
Potassium: 4.4 mmol/L (ref 3.5–5.1)
Sodium: 137 mmol/L (ref 135–145)
Total Bilirubin: 1 mg/dL (ref 0.3–1.2)
Total Protein: 6.8 g/dL (ref 6.5–8.1)

## 2020-11-24 NOTE — Progress Notes (Signed)
[facility] OFFICE VISIT PROGRESS NOTE  I connected with Leeanne Mannan on 11/24/20 at  2:40 PM EDT by video enabled telemedicine visit and verified that I am speaking with the correct person using two identifiers.   I discussed the limitations, risks, security and privacy concerns of performing an evaluation and management service by telemedicine and the availability of in-person appointments. I also discussed with the patient that there may be a patient responsible charge related to this service. The patient expressed understanding and agreed to proceed.  Patient's location: Office Provider's location: Home   Diagnosis: Multiple myeloma  INTERVAL HISTORY:  Ms. Lecount is seen for a scheduled visit.  She feels well.  She has mild pelvic bone discomfort with heavy lifting.  She continues to have pain in the foot where she had surgery.  No recent infection.  She has received the COVID-19 booster vaccines.  She has noted increased symptoms from allergies.  Objective:  Vital signs in last 24 hours:  Blood pressure 112/72, pulse 75, temperature 97.7 F (36.5 C), temperature source Oral, resp. rate 18, height '5\' 9"'  (1.753 m), weight 135 lb (61.2 kg), SpO2 98 %.     Lab Results:  Lab Results  Component Value Date   WBC 4.1 11/24/2020   HGB 14.8 11/24/2020   HCT 43.7 11/24/2020   MCV 95.4 11/24/2020   PLT 168 11/24/2020   NEUTROABS 2.4 11/24/2020     Medications: I have reviewed the patient's current medications.  Assessment/Plan: Multiple myeloma diagnosed in November 2007, IgA kappa, status post high-dose chemotherapy with autologous stem cell support at [facility] in June 2008. She remains in clinical remission.  history of Renal insufficiency secondary to multiple myeloma, now with chronic renal insufficiency History of hypokalemia. History of left pelvic pain secondary to a lytic left iliac lesion. A metastatic bone survey  04/20/2017  revealed stable lucencies at the left sacral ala and medial left iliac, questionable new tiny lucency at the superior aspect of the left sacral ala Simple cyst at the lower pole of the right kidney on a renal ultrasound, 11/08/2006. Multiseptated cyst of the left adnexa on an ultrasound, 11/08/2006. Osteopenia Mitral valve prolapse/mitral regurgitation -minimally invasive mitral valve repair 10/15/2015 Report of intermittent rectal bleeding. Normal colonoscopy by Dr. Fuller Plan on 08/20/2011.     Disposition: Ms. Dobbin is in clinical remission from multiple myeloma.  Her initial diagnosis of IgA kappa myeloma dates to November 2007.  The IgA level was normal and the kappa light chains were stable in December 2021.  We will follow-up on the myeloma panel from today.  There is no clinical evidence for progression of the myeloma.  She will have a metastatic bone survey within the next 1 month.  Ms. Shuster will complete an influenza vaccine in the fall.  She is up-to-date on the pneumococcal vaccine.  She will return for an office and lab visit in 6 months.  She will follow-up with her primary provider for management of seasonal allergies.   I discussed the assessment and treatment plan with the patient. The patient was provided an opportunity to ask questions and all were answered. The patient agreed with the plan and demonstrated an understanding of the instructions.   The patient was advised to call back or seek an in-person evaluation if the symptoms worsen or if the condition fails to improve as anticipated.  I provided 20 minutes of chart review, video, and documentation time during this encounter,  and > 50% was spent counseling as documented under my assessment & plan.  [name] ANP/GNP-BC   11/24/2020 2:26 PM

## 2020-11-25 LAB — PROTEIN ELECTROPHORESIS, SERUM
A/G Ratio: 2 — ABNORMAL HIGH (ref 0.7–1.7)
Albumin ELP: 4.4 g/dL (ref 2.9–4.4)
Alpha-1-Globulin: 0.1 g/dL (ref 0.0–0.4)
Alpha-2-Globulin: 0.6 g/dL (ref 0.4–1.0)
Beta Globulin: 0.7 g/dL (ref 0.7–1.3)
Gamma Globulin: 0.8 g/dL (ref 0.4–1.8)
Globulin, Total: 2.2 g/dL (ref 2.2–3.9)
Total Protein ELP: 6.6 g/dL (ref 6.0–8.5)

## 2020-11-25 LAB — KAPPA/LAMBDA LIGHT CHAINS
Kappa free light chain: 21.3 mg/L — ABNORMAL HIGH (ref 3.3–19.4)
Kappa, lambda light chain ratio: 1.46 (ref 0.26–1.65)
Lambda free light chains: 14.6 mg/L (ref 5.7–26.3)

## 2020-11-25 LAB — IGA: IgA: 109 mg/dL (ref 87–352)

## 2020-11-29 ENCOUNTER — Other Ambulatory Visit: Payer: Self-pay | Admitting: Family Medicine

## 2020-11-29 DIAGNOSIS — I10 Essential (primary) hypertension: Secondary | ICD-10-CM

## 2020-12-08 ENCOUNTER — Other Ambulatory Visit: Payer: Self-pay

## 2020-12-08 ENCOUNTER — Ambulatory Visit
Admission: RE | Admit: 2020-12-08 | Discharge: 2020-12-08 | Disposition: A | Discharge status: Home / Self Care | Payer: BC Managed Care – PPO | Source: Ambulatory Visit | Attending: Family Medicine | Admitting: Family Medicine

## 2020-12-08 DIAGNOSIS — Z1231 Encounter for screening mammogram for malignant neoplasm of breast: Secondary | ICD-10-CM

## 2020-12-23 ENCOUNTER — Telehealth: Payer: Self-pay | Admitting: *Deleted

## 2020-12-23 NOTE — Telephone Encounter (Signed)
error 

## 2020-12-24 DIAGNOSIS — Z20822 Contact with and (suspected) exposure to covid-19: Secondary | ICD-10-CM | POA: Diagnosis not present

## 2021-01-11 ENCOUNTER — Encounter: Payer: Self-pay | Admitting: Oncology

## 2021-03-17 ENCOUNTER — Encounter: Payer: Self-pay | Admitting: Oncology

## 2021-03-26 ENCOUNTER — Encounter: Payer: Self-pay | Admitting: Family Medicine

## 2021-04-10 ENCOUNTER — Encounter: Payer: Self-pay | Admitting: Family Medicine

## 2021-05-05 ENCOUNTER — Telehealth: Payer: Self-pay | Admitting: Family Medicine

## 2021-05-05 NOTE — Telephone Encounter (Signed)
Pt is calling and has not had generic valtrex is years and due to personal reason would like new rx sent to  . Pt is aware she may need an appt and wanted to see if md wants her to make an ov [id number] - [place] Phone:  [phone]  Fax:  [phone]

## 2021-05-06 MED ORDER — ACYCLOVIR 200 MG PO CAPS: ORAL_CAPSULE | ORAL | 0 refills | Status: DC

## 2021-05-06 NOTE — Telephone Encounter (Signed)
Spoke with the patient and informed her of the message below.  Patient stated she asked for Acyclovir and the person that took her call stated the Rx was generic Valtrex.  After reviewing previous medication list history, Acyclovir was verified with the patient and she is aware the Rx was [facility] to [facility].

## 2021-05-18 ENCOUNTER — Other Ambulatory Visit: Payer: Self-pay | Admitting: Family Medicine

## 2021-05-18 DIAGNOSIS — I10 Essential (primary) hypertension: Secondary | ICD-10-CM

## 2021-05-19 ENCOUNTER — Encounter: Payer: Self-pay | Admitting: Oncology

## 2021-05-20 ENCOUNTER — Other Ambulatory Visit: Payer: Self-pay

## 2021-05-20 DIAGNOSIS — C9001 Multiple myeloma in remission: Secondary | ICD-10-CM

## 2021-05-26 ENCOUNTER — Inpatient Hospital Stay: Payer: BC Managed Care – PPO | Admitting: Oncology

## 2021-05-26 ENCOUNTER — Other Ambulatory Visit: Payer: Self-pay

## 2021-05-26 ENCOUNTER — Encounter: Payer: Self-pay | Admitting: Oncology

## 2021-05-26 ENCOUNTER — Inpatient Hospital Stay: Payer: BC Managed Care – PPO | Attending: Oncology

## 2021-05-26 VITALS — BP 106/72 | HR 68 | Temp 97.8°F | Resp 18 | Ht 69.0 in | Wt 135.6 lb

## 2021-05-26 DIAGNOSIS — I341 Nonrheumatic mitral (valve) prolapse: Secondary | ICD-10-CM | POA: Diagnosis not present

## 2021-05-26 DIAGNOSIS — M8580 Other specified disorders of bone density and structure, unspecified site: Secondary | ICD-10-CM | POA: Diagnosis not present

## 2021-05-26 DIAGNOSIS — C9001 Multiple myeloma in remission: Secondary | ICD-10-CM

## 2021-05-26 DIAGNOSIS — K625 Hemorrhage of anus and rectum: Secondary | ICD-10-CM | POA: Insufficient documentation

## 2021-05-26 DIAGNOSIS — N189 Chronic kidney disease, unspecified: Secondary | ICD-10-CM | POA: Diagnosis not present

## 2021-05-26 DIAGNOSIS — I340 Nonrheumatic mitral (valve) insufficiency: Secondary | ICD-10-CM | POA: Insufficient documentation

## 2021-05-26 DIAGNOSIS — C9000 Multiple myeloma not having achieved remission: Secondary | ICD-10-CM | POA: Insufficient documentation

## 2021-05-26 LAB — CBC WITH DIFFERENTIAL (CANCER CENTER ONLY)
Abs Immature Granulocytes: 0.02 10*3/uL (ref 0.00–0.07)
Basophils Absolute: 0 10*3/uL (ref 0.0–0.1)
Basophils Relative: 0 %
Eosinophils Absolute: 0 10*3/uL (ref 0.0–0.5)
Eosinophils Relative: 1 %
HCT: 47.2 % — ABNORMAL HIGH (ref 36.0–46.0)
Hemoglobin: 15.4 g/dL — ABNORMAL HIGH (ref 12.0–15.0)
Immature Granulocytes: 0 %
Lymphocytes Relative: 30 %
Lymphs Abs: 1.5 10*3/uL (ref 0.7–4.0)
MCH: 31.5 pg (ref 26.0–34.0)
MCHC: 32.6 g/dL (ref 30.0–36.0)
MCV: 96.5 fL (ref 80.0–100.0)
Monocytes Absolute: 0.4 10*3/uL (ref 0.1–1.0)
Monocytes Relative: 8 %
Neutro Abs: 3 10*3/uL (ref 1.7–7.7)
Neutrophils Relative %: 61 %
Platelet Count: 179 10*3/uL (ref 150–400)
RBC: 4.89 MIL/uL (ref 3.87–5.11)
RDW: 12.7 % (ref 11.5–15.5)
WBC Count: 5 10*3/uL (ref 4.0–10.5)
nRBC: 0 % (ref 0.0–0.2)

## 2021-05-26 LAB — CMP (CANCER CENTER ONLY)
ALT: 15 U/L (ref 0–44)
AST: 22 U/L (ref 15–41)
Albumin: 4.7 g/dL (ref 3.5–5.0)
Alkaline Phosphatase: 53 U/L (ref 38–126)
Anion gap: 7 (ref 5–15)
BUN: 22 mg/dL (ref 8–23)
CO2: 32 mmol/L (ref 22–32)
Calcium: 10.3 mg/dL (ref 8.9–10.3)
Chloride: 98 mmol/L (ref 98–111)
Creatinine: 1.16 mg/dL — ABNORMAL HIGH (ref 0.44–1.00)
GFR, Estimated: 53 mL/min — ABNORMAL LOW (ref >60)
Glucose, Bld: 77 mg/dL (ref 70–99)
Potassium: 4.5 mmol/L (ref 3.5–5.1)
Sodium: 137 mmol/L (ref 135–145)
Total Bilirubin: 1.1 mg/dL (ref 0.3–1.2)
Total Protein: 7.5 g/dL (ref 6.5–8.1)

## 2021-05-26 NOTE — Progress Notes (Signed)
[facility] OFFICE PROGRESS NOTE   Diagnosis: Multiple myeloma   INTERVAL HISTORY:   Cristina Garcia returns as scheduled.  No recent infection.  She has chronic discomfort at the site of foot surgery.  She is up-to-date on COVID-19 and influenza vaccines.  Objective:  Vital signs in last 24 hours:  Blood pressure 106/72, pulse 68, temperature 97.8 F (36.6 C), temperature source Oral, resp. rate 18, height $RemoveBe'5\' 9"'DonVXMdPx$  (1.753 m), weight 135 lb 9.6 oz (61.5 kg), SpO2 100 %.   Resp: Lungs clear bilaterally Cardio: Regular rate and rhythm GI: No hepatosplenomegaly Vascular: No leg edema Musculoskeletal: Mild tenderness at the left posterior iliac  Portacath/PICC-without erythema  Lab Results:  Lab Results  Component Value Date   WBC 5.0 05/26/2021   HGB 15.4 (H) 05/26/2021   HCT 47.2 (H) 05/26/2021   MCV 96.5 05/26/2021   PLT 179 05/26/2021   NEUTROABS 3.0 05/26/2021    CMP  Lab Results  Component Value Date   NA 137 05/26/2021   K 4.5 05/26/2021   CL 98 05/26/2021   CO2 32 05/26/2021   GLUCOSE 77 05/26/2021   BUN 22 05/26/2021   CREATININE 1.16 (H) 05/26/2021   CALCIUM 10.3 05/26/2021   PROT 7.5 05/26/2021   ALBUMIN 4.7 05/26/2021   AST 22 05/26/2021   ALT 15 05/26/2021   ALKPHOS 53 05/26/2021   BILITOT 1.1 05/26/2021   GFRNONAA 53 (L) 05/26/2021   GFRAA 43 (L) 12/04/2019    Lab Results  Component Value Date   CEA 0.7 04/19/2006   CA125 28.1 11/24/2006    Lab Results  Component Value Date   INR 1.7 01/09/2016   LABPROT 17.4 (H) 10/21/2015    Imaging:  No results found.  Medications: I have reviewed the patient's current medications.   Assessment/Plan: Multiple myeloma diagnosed in November 2007, IgA kappa, status post high-dose chemotherapy with autologous stem cell support at [facility] in June 2008. She remains in clinical remission.  history of Renal insufficiency secondary to multiple myeloma, now with chronic renal insufficiency History  of hypokalemia. History of left pelvic pain secondary to a lytic left iliac lesion. A metastatic bone survey  04/20/2017 revealed stable lucencies at the left sacral ala and medial left iliac, questionable new tiny lucency at the superior aspect of the left sacral ala Simple cyst at the lower pole of the right kidney on a renal ultrasound, 11/08/2006. Multiseptated cyst of the left adnexa on an ultrasound, 11/08/2006. Osteopenia Mitral valve prolapse/mitral regurgitation -minimally invasive mitral valve repair 10/15/2015 Report of intermittent rectal bleeding. Normal colonoscopy by Dr. Fuller Plan on 08/20/2011.   Disposition: [name] appears stable.  There is no clinical evidence for progression of myeloma.  She will be scheduled for a bone survey within the next few weeks.  The mild elevation of the hemoglobin is likely benign nonspecific finding.  Her hemoglobin has been mildly elevated in the past.  We will follow-up on the myeloma panel from today.  She will return for an office and lab visit in 6 months.  Cristina Coder, MD  05/26/2021  3:13 PM

## 2021-05-27 LAB — IGG, IGA, IGM
IgA: 114 mg/dL (ref 87–352)
IgG (Immunoglobin G), Serum: 945 mg/dL (ref 586–1602)
IgM (Immunoglobulin M), Srm: 86 mg/dL (ref 26–217)

## 2021-05-27 LAB — KAPPA/LAMBDA LIGHT CHAINS
Kappa free light chain: 20.9 mg/L — ABNORMAL HIGH (ref 3.3–19.4)
Kappa, lambda light chain ratio: 1.38 (ref 0.26–1.65)
Lambda free light chains: 15.2 mg/L (ref 5.7–26.3)

## 2021-05-28 ENCOUNTER — Other Ambulatory Visit: Payer: Self-pay | Admitting: Family Medicine

## 2021-06-29 ENCOUNTER — Ambulatory Visit (HOSPITAL_BASED_OUTPATIENT_CLINIC_OR_DEPARTMENT_OTHER)
Admission: RE | Admit: 2021-06-29 | Discharge: 2021-06-29 | Disposition: A | Discharge status: Home / Self Care | Payer: BC Managed Care – PPO | Source: Ambulatory Visit | Attending: Oncology | Admitting: Oncology

## 2021-06-29 ENCOUNTER — Other Ambulatory Visit (HOSPITAL_BASED_OUTPATIENT_CLINIC_OR_DEPARTMENT_OTHER): Payer: BC Managed Care – PPO | Admitting: Radiology

## 2021-06-29 ENCOUNTER — Other Ambulatory Visit: Payer: Self-pay

## 2021-06-29 DIAGNOSIS — C9001 Multiple myeloma in remission: Secondary | ICD-10-CM | POA: Diagnosis not present

## 2021-07-07 ENCOUNTER — Other Ambulatory Visit: Payer: Self-pay

## 2021-07-07 ENCOUNTER — Ambulatory Visit (HOSPITAL_COMMUNITY): Payer: BC Managed Care – PPO | Attending: Cardiology

## 2021-07-07 DIAGNOSIS — Z9889 Other specified postprocedural states: Secondary | ICD-10-CM | POA: Diagnosis not present

## 2021-07-07 DIAGNOSIS — I340 Nonrheumatic mitral (valve) insufficiency: Secondary | ICD-10-CM | POA: Diagnosis not present

## 2021-07-07 LAB — ECHOCARDIOGRAM COMPLETE
Area-P 1/2: 1.98 cm2
MV M vel: 4.81 m/s
MV Peak grad: 92.5 mmHg
MV VTI: 1.15 cm2
S' Lateral: 3.3 cm

## 2021-07-10 ENCOUNTER — Ambulatory Visit: Payer: BC Managed Care – PPO | Admitting: Cardiology

## 2021-07-10 ENCOUNTER — Other Ambulatory Visit: Payer: Self-pay

## 2021-07-10 ENCOUNTER — Encounter: Payer: Self-pay | Admitting: Cardiology

## 2021-07-10 VITALS — BP 104/62 | HR 75 | Ht 69.0 in | Wt 134.2 lb

## 2021-07-10 DIAGNOSIS — Z9889 Other specified postprocedural states: Secondary | ICD-10-CM

## 2021-07-10 DIAGNOSIS — I050 Rheumatic mitral stenosis: Secondary | ICD-10-CM

## 2021-07-10 DIAGNOSIS — I340 Nonrheumatic mitral (valve) insufficiency: Secondary | ICD-10-CM

## 2021-07-10 DIAGNOSIS — E785 Hyperlipidemia, unspecified: Secondary | ICD-10-CM

## 2021-07-10 DIAGNOSIS — I10 Essential (primary) hypertension: Secondary | ICD-10-CM | POA: Diagnosis not present

## 2021-07-10 DIAGNOSIS — I5022 Chronic systolic (congestive) heart failure: Secondary | ICD-10-CM

## 2021-07-10 NOTE — H&P (View-Only) (Signed)
Cardiology Office Note:    Date:  07/10/2021   ID:  Cristina Garcia, DOB 1961-03-23, MRN [id number]  PCP:  Caren Macadam, MD  Cardiologist:  Ena Dawley, MD    Referring MD: Caren Macadam, MD   No chief complaint on file.   History of Present Illness:    Cristina Garcia is a 61 y.o. female with a hx of of mitral valve prolapse, mitral regurgitation, hypertension, and CKD presents for follow-up previously seen by Dr. Meda Coffee.    Per review of record, patient had a long history of murmur and was told for years that she has mitral valve prolapse.  Echo in 1/15 showed bileaflet prolapse and probably moderate MR.  TEE was done in 3/15 and showed moderate to severe MR from bileaflet mitral valve prolapse with normal LV size and no pulmonary vein systolic doppler flow reversal.  She had repeat echo in 6/16.  It continued to show moderate to severe mid to late systolic mitral regurgitation in the setting of MVP.  EF 60%, mild LV dilation, moderate LAE.  Echo in 2/17 showed severe MR due to MVP. Last winter, she developed exertional dyspnea. TEE confirmed severe MR from bileaflet prolapse. LHC showed no significant CAD.  In 5/17, she had a complex minimally invasive mitral valve repair by Dr Roxy Manns.  She had Alfieri edge-to-edge repair and annuloplasty.  Post-operatively, she required a right thoracentesis. Echo in 6/17 (post-op) showed EF 45%, mild LV dilation, stable repaired mitral valve with no MR or MS.  PASP 48 mmHg.   She saw Ermalinda Barrios via virtual visit on 08/04/2020 where she was dealing with a lot of stress at work and had mild palpitations when laying down at night but no HF symptoms. She ordered a yearly TTE for monitoring which was performed on 07/07/21 and revealed severe MR, moderate MS with mean gradient 6, EF 56%, G2DD, GLS -16.6 (mildly reduced). Given echo findings, she now presents to clinic for further evaluation.  Today, the patient states she is overall doing okay.  In the past few months, she has noticed "feeling off." When she goes on her walks, she becomes lightheaded and has to pause and rest. Her sleep has also been disrupted. In the morning, she wakes up feeling tired. The patient denies chest pain, chest pressure, palpitations, PND, orthopnea, or leg swelling. She wonders if these symptoms of fatigue and lightheadedness are related to the construction, work stress, or heart related. She continues to work and stays active, but has less energy than before.   We discussed the results of her most recent echo, the status of her mitral valve, and valve replacement.   She reports she has kidney damage from kidney cancer. Her blood pressure tends to stay low. Of note, she underwent L foot surgery and endorses nerve pain in that foot.   She denies cough, fever, chills. Denies nausea, vomiting. Denies syncope or presyncope. Denies snoring.  Past Medical History:  Diagnosis Date   Allergy    Anxiety    Atrial tachycardia ([place]) 12/18/2014   24 hour event monitor with frequent PAC's, PVC's and short runs of atrial tachycardia - no sustained arrhythmias or atrial fibrillation    Chronic kidney disease (CKD)    Constipation    H/O multiple myeloma    clinical remission   Hypertension    Mitral regurgitation    Mitral valve prolapse    Osteoporosis    Pleural effusion, right 11/03/2015   Pleural  effusion, right 11/03/2015   S/P minimally invasive mitral valve repair 10/15/2015   Complex valvuloplasty including Alfieri edge-to-edge repair, generous quadrangular resection (shortening) of posterior leaflet, sliding leaflet plasty, Gore-tex neochord placement x6, and 38 mm Edwards Physio II ring annuloplasty via right mini thoracotomy approach     Past Surgical History:  Procedure Laterality Date   BREAST ENHANCEMENT SURGERY  2000   CARDIAC CATHETERIZATION N/A 08/18/2015   Procedure: Right/Left Heart Cath and Coronary Angiography;  Surgeon: Larey Dresser, MD;   Location: [facility];  Service: Cardiovascular;  Laterality: N/A;   CESAREAN SECTION  1992   HAMMERTOE RECONSTRUCTION WITH WEIL OSTEOTOMY Left 09/09/2016   Procedure: Left 2-4 MT Weil osteotomies; Left 2nd hammertoe correction and extensor tendon lengthing;  Surgeon: Wylene Simmer, MD;  Location: [facility];  Service: Orthopedics;  Laterality: Left;   LIMBAL STEM CELL TRANSPLANT  2008   MITRAL VALVE REPAIR Right 10/15/2015   Procedure: MINIMALLY INVASIVE MITRAL VALVE REPAIR (MVR);  Surgeon: Rexene Alberts, MD;  Location: [facility];  Service: Open Heart Surgery;  Laterality: Right;   OOPHORECTOMY  1991   left; got cyst on ovary with torsion when pregnant.   TEE WITHOUT CARDIOVERSION N/A 09/05/2013   Procedure: TRANSESOPHAGEAL ECHOCARDIOGRAM (TEE);  Surgeon: Larey Dresser, MD;  Location: [facility];  Service: Cardiovascular;  Laterality: N/A;   TEE WITHOUT CARDIOVERSION N/A 08/18/2015   Procedure: TRANSESOPHAGEAL ECHOCARDIOGRAM (TEE);  Surgeon: Larey Dresser, MD;  Location: [facility];  Service: Cardiovascular;  Laterality: N/A;   TEE WITHOUT CARDIOVERSION N/A 10/15/2015   Procedure: TRANSESOPHAGEAL ECHOCARDIOGRAM (TEE);  Surgeon: Rexene Alberts, MD;  Location: [facility];  Service: Open Heart Surgery;  Laterality: N/A;   WISDOM TOOTH EXTRACTION  1975    Current Medications: Current Meds  Medication Sig   acyclovir (ZOVIRAX) 200 MG capsule TAKE 3 CAPSULES BY MOUTH IN THE MORNING, 2 IN THE EVENING UNTIL WELL AND THEN TAKE 1 DAILY   Ascorbic Acid (VITAMIN C) 500 MG CAPS Take 500 mg by mouth daily.   aspirin 81 MG tablet Take 81 mg by mouth daily. Reported on 10/08/2015   cetirizine (ZYRTEC) 10 MG tablet Take 10 mg by mouth daily.   lisinopril (ZESTRIL) 5 MG tablet TAKE 1 TABLET BY MOUTH EVERY DAY IN THE MORNING   magnesium oxide (MAG-OX) 400 MG tablet Take 400 mg by mouth daily.   metoprolol succinate (TOPROL-XL) 25 MG 24 hr tablet TAKE 1/2 TABLET BY MOUTH EVERY DAY   Multiple  Vitamins-Minerals (MULTIVITAL PO) Take 1 tablet by mouth daily.     Allergies:   Patient has no known allergies.   Social History   Socioeconomic History   Marital status: Married    Spouse name: Not on file   Number of children: Not on file   Years of education: Not on file   Highest education level: Not on file  Occupational History   Not on file  Tobacco Use   Smoking status: Never   Smokeless tobacco: Never  Vaping Use   Vaping Use: Not on file  Substance and Sexual Activity   Alcohol use: Yes    Alcohol/week: 2.0 standard drinks    Types: 2 Glasses of wine per week    Comment: socially   Drug use: No   Sexual activity: Not on file  Other Topics Concern   Not on file  Social History Narrative   Not on file   Social Determinants of Health   Financial  Resource Strain: Not on file  Food Insecurity: Not on file  Transportation Needs: Not on file  Physical Activity: Not on file  Stress: Not on file  Social Connections: Not on file     Family History: The patient's family history includes Congestive Heart Failure in her mother; Diabetes in her mother and another family member; Hypertension in her father and another family member; Lymphoma in an other family member; Non-Hodgkin's lymphoma in her mother; Other in her maternal grandmother; Renal cancer in her paternal grandmother; Rheumatic fever in her mother. There is no history of Colon cancer or Stomach cancer.  ROS:   Please see the history of present illness.    Review of Systems  Constitutional:  Positive for malaise/fatigue. Negative for weight loss.  HENT:  Negative for congestion.   Eyes:  Negative for double vision.  Respiratory:  Negative for cough and shortness of breath.   Cardiovascular:  Negative for chest pain, palpitations, orthopnea, claudication, leg swelling and PND.  Gastrointestinal:  Negative for heartburn and nausea.  Musculoskeletal:  Positive for joint pain (L foot). Negative for neck pain.   Skin:  Negative for itching and rash.  Neurological:  Positive for dizziness. Negative for headaches.  Endo/Heme/Allergies:  Does not bruise/bleed easily.  Psychiatric/Behavioral:  Negative for depression. The patient has insomnia.    All other systems reviewed and negative.   EKGs/Labs/Other Studies Reviewed:    The following studies were reviewed today: Echo 07/07/21  1. Left ventricular ejection fraction by 3D volume is 56 %. The left  ventricle has normal function. The left ventricle has no regional wall  motion abnormalities. Left ventricular diastolic parameters are consistent  with Grade II diastolic dysfunction (pseudonormalization). The average left ventricular global longitudinal strain is -16.6 %. The global longitudinal strain is abnormal.   2. Right ventricular systolic function is normal. The right ventricular  size is normal. There is normal pulmonary artery systolic pressure. The  estimated right ventricular systolic pressure is 38.2 mmHg.   3. The mitral valve has been repaired. Severe mitral valve regurgitation.  Moderate mitral stenosis. The mean mitral valve gradient is 6.0 mmHg.  There is a 38 Edwards Physio II ring annuloplasty present in the mitral  position. Procedure Date: 10/15/15.   4. The aortic valve is normal in structure. Aortic valve regurgitation is  not visualized. No aortic stenosis is present.   5. The inferior vena cava is normal in size with greater than 50%  respiratory variability, suggesting right atrial pressure of 3 mmHg.   Comparison(s): Prior images reviewed side by side. 06/23/20 EF 60-65%.  Moderate MR. MV 42mHg peak, 745mg mean PG.   Echo ([place]) 10/15/15 - Left ventricle: The cavity size was dilated. Wall thickness was    normal. Systolic function was normal. The estimated ejection    fraction was = 55%. Wall motion was normal; there were no    regional wall motion abnormalities.  - Mitral valve: Dilated annulus. Moderately thickened  leaflets .    Severe myxomatous degeneration. No evidence of vegetation. There    was moderate regurgitation.  - Left atrium: No evidence of thrombus in the atrial cavity or    appendage.  - Atrial septum: No defect or patent foramen ovale was identified.   Doppler Pre-CABG 10/10/15 Findings consistent with 1-39 percent stenosis involving the right  internal carotid artery and the left internal carotid artery.  Prepared and Electronically Authenticated by   R/L Cath and Coronary Angio 08/18/15 1. Normal left  and right heart filling pressures with prominent v-waves noted on PCWP pressure tracing.  2. No angiographic coronary artery disease.  3. 3+ mitral regurgitation noted.    Will refer for evaluation for minimally invasive mitral valve repair.  EKG:   07/10/21: Sinus rhythm, rate 75 bpm; L atrium enlargement  Recent Labs: 05/26/2021: ALT 15; BUN 22; Creatinine 1.16; Hemoglobin 15.4; Platelet Count 179; Potassium 4.5; Sodium 137   Recent Lipid Panel    Component Value Date/Time   CHOL 251 (H) 05/19/2020 1021   TRIG 83 05/19/2020 1021   HDL 100 05/19/2020 1021   CHOLHDL 2.5 05/19/2020 1021   VLDL 13.8 06/18/2014 0856   LDLCALC 133 (H) 05/19/2020 1021   LDLDIRECT 104.4 08/13/2010 0835    CHA2DS2-VASc Score =   [ ].  Therefore, the patient's annual risk of stroke is   %.        Physical Exam:    VS:  BP 104/62    Pulse 75    Ht 5' 9" (1.753 m)    Wt 134 lb 3.2 oz (60.9 kg)    SpO2 99%    BMI 19.82 kg/m     Wt Readings from Last 3 Encounters:  07/10/21 134 lb 3.2 oz (60.9 kg)  05/26/21 135 lb 9.6 oz (61.5 kg)  11/24/20 135 lb (61.2 kg)     GEN:  Well nourished, well developed in no acute distress HEENT: Normal NECK: No JVD; No carotid bruits CARDIAC: RRR, 2/6 holosystolic murmur best heard at the LV apex, no rubs or gallops RESPIRATORY:  Clear to auscultation without rales, wheezing or rhonchi  ABDOMEN: Soft, non-tender, non-distended MUSCULOSKELETAL:  No edema; No  deformity  SKIN: Warm and dry NEUROLOGIC:  Alert and oriented x 3 PSYCHIATRIC:  Normal affect   ASSESSMENT:    1. Severe mitral regurgitation   2. S/P mitral valve repair   3. Essential hypertension   4. Hyperlipidemia, unspecified hyperlipidemia type   5. Chronic systolic heart failure ([place])   6. Moderate mitral stenosis    PLAN:    In order of problems listed above:  #History of severe MR s/p MV repair with 48 Edwards Physio II annuloplasty ring in 2017: #Recurrent Severe MR  #Moderate MS: Patient with history of myxomatous mitral valve disease with bileaflet prolapse and severe MR s/p minimally invasive mitral valve repair with annuloplasty ring in 2017 with Dr. Roxy Manns. Over the past several months, the patient has noticed generalized malaise, fatigue and occasional lightheadedness. TTE 07/07/21 showed EF 56%, severe MR, moderate MS prompting her to come today. Currently, she is euvolemic and compensated on exam. BP well controlled with SBP in 100s. Will plan for TEE prior to RHC/LHC and TCTS referral. -Plan for TEE -Once TEE completed and if confirms severe MR, plan for RHC/LHC and TCTS referral -Continue lisinopril 73m daily -Continue metop 12.558mdaily -Euvolemic on exam and not on diuretics  #History of NICM with Improved EF: Ef dropped to 45% post MVR but improved to 56% on most recent TTE. Clinically euvolemic. -Continue lisinopril 23m60maily -Continue metop 12.23mg47mily -Daily weights  #HTN: Very well controled and at goal. -Continue lisinopril 23mg 76mly -Continue metop 12.23mg d68my  #HLD: Pre-op cath in 2017 without obstructive disease. LDL 133 not currently on statin. -Will plan for statin therapy if unable to lower cholesterol with lifestyle modifications -If cath with evidence of disease, plan to start crestor  #History of Multiple Myeloma c/b CKD: -Followed by Dr. SherriBenay Spice  Medication Adjustments/Labs and Tests Ordered: Current medicines are reviewed  at length with the patient today.  Concerns regarding medicines are outlined above.  Orders Placed This Encounter  Procedures   EKG 12-Lead   No orders of the defined types were placed in this encounter.  I,Mykaella Javier,acting as a scribe for Freada Bergeron, MD.,have documented all relevant documentation on the behalf of Freada Bergeron, MD,as directed by  Freada Bergeron, MD while in the presence of Freada Bergeron, MD.  I, Freada Bergeron, MD, have reviewed all documentation for this visit. The documentation on 07/10/21 for the exam, diagnosis, procedures, and orders are all accurate and complete.   Signed, Freada Bergeron, MD  07/10/2021 9:04 AM    [place]

## 2021-07-10 NOTE — Progress Notes (Signed)
°Cardiology Office Note:   ° °Date:  07/10/2021  ° °ID:  Cristina Garcia, DOB 07/03/1960, MRN [id number] ° °PCP:  Koberlein, Junell C, MD  °Cardiologist:  Katarina Nelson, MD   ° °Referring MD: Koberlein, Junell C, MD  ° °No chief complaint on file. ° ° °History of Present Illness:   ° °Cristina Garcia is a 60 y.o. female with a hx of of mitral valve prolapse, mitral regurgitation, hypertension, and CKD presents for follow-up previously seen by Dr. Nelson.   ° °Per review of record, patient had a long history of murmur and was told for years that she has mitral valve prolapse.  Echo in 1/15 showed bileaflet prolapse and probably moderate MR.  TEE was done in 3/15 and showed moderate to severe MR from bileaflet mitral valve prolapse with normal LV size and no pulmonary vein systolic doppler flow reversal.  She had repeat echo in 6/16.  It continued to show moderate to severe mid to late systolic mitral regurgitation in the setting of MVP.  EF 60%, mild LV dilation, moderate LAE.  Echo in 2/17 showed severe MR due to MVP. Last winter, she developed exertional dyspnea. TEE confirmed severe MR from bileaflet prolapse. LHC showed no significant CAD.  In 5/17, she had a complex minimally invasive mitral valve repair by Dr Owen.  She had Alfieri edge-to-edge repair and annuloplasty.  Post-operatively, she required a right thoracentesis. Echo in 6/17 (post-op) showed EF 45%, mild LV dilation, stable repaired mitral valve with no MR or MS.  PASP 48 mmHg.  ° °She saw Michele Lenze via virtual visit on 08/04/2020 where she was dealing with a lot of stress at work and had mild palpitations when laying down at night but no HF symptoms. She ordered a yearly TTE for monitoring which was performed on 07/07/21 and revealed severe MR, moderate MS with mean gradient 6, EF 56%, G2DD, GLS -16.6 (mildly reduced). Given echo findings, she now presents to clinic for further evaluation. ° °Today, the patient states she is overall doing okay.  In the past few months, she has noticed "feeling off." When she goes on her walks, she becomes lightheaded and has to pause and rest. Her sleep has also been disrupted. In the morning, she wakes up feeling tired. The patient denies chest pain, chest pressure, palpitations, PND, orthopnea, or leg swelling. She wonders if these symptoms of fatigue and lightheadedness are related to the construction, work stress, or heart related. She continues to work and stays active, but has less energy than before.  ° °We discussed the results of her most recent echo, the status of her mitral valve, and valve replacement.  ° °She reports she has kidney damage from kidney cancer. Her blood pressure tends to stay low. Of note, she underwent L foot surgery and endorses nerve pain in that foot.  ° °She denies cough, fever, chills. Denies nausea, vomiting. Denies syncope or presyncope. Denies snoring. ° °Past Medical History:  °Diagnosis Date  ° Allergy   ° Anxiety   ° Atrial tachycardia (HCC) 12/18/2014  ° 24 hour event monitor with frequent PAC's, PVC's and short runs of atrial tachycardia - no sustained arrhythmias or atrial fibrillation   ° Chronic kidney disease (CKD)   ° Constipation   ° H/O multiple myeloma   ° clinical remission  ° Hypertension   ° Mitral regurgitation   ° Mitral valve prolapse   ° Osteoporosis   ° Pleural effusion, right 11/03/2015  ° Pleural effusion,   effusion, right 11/03/2015   S/P minimally invasive mitral valve repair 10/15/2015   Complex valvuloplasty including Alfieri edge-to-edge repair, generous quadrangular resection (shortening) of posterior leaflet, sliding leaflet plasty, Gore-tex neochord placement x6, and 38 mm Edwards Physio II ring annuloplasty via right mini thoracotomy approach     Past Surgical History:  Procedure Laterality Date   BREAST ENHANCEMENT SURGERY  2000   CARDIAC CATHETERIZATION N/A 08/18/2015   Procedure: Right/Left Heart Cath and Coronary Angiography;  Surgeon: Larey Dresser, MD;   Location: [facility];  Service: Cardiovascular;  Laterality: N/A;   CESAREAN SECTION  1992   HAMMERTOE RECONSTRUCTION WITH WEIL OSTEOTOMY Left 09/09/2016   Procedure: Left 2-4 MT Weil osteotomies; Left 2nd hammertoe correction and extensor tendon lengthing;  Surgeon: Wylene Simmer, MD;  Location: [facility];  Service: Orthopedics;  Laterality: Left;   LIMBAL STEM CELL TRANSPLANT  2008   MITRAL VALVE REPAIR Right 10/15/2015   Procedure: MINIMALLY INVASIVE MITRAL VALVE REPAIR (MVR);  Surgeon: Rexene Alberts, MD;  Location: [facility];  Service: Open Heart Surgery;  Laterality: Right;   OOPHORECTOMY  1991   left; got cyst on ovary with torsion when pregnant.   TEE WITHOUT CARDIOVERSION N/A 09/05/2013   Procedure: TRANSESOPHAGEAL ECHOCARDIOGRAM (TEE);  Surgeon: Larey Dresser, MD;  Location: [facility];  Service: Cardiovascular;  Laterality: N/A;   TEE WITHOUT CARDIOVERSION N/A 08/18/2015   Procedure: TRANSESOPHAGEAL ECHOCARDIOGRAM (TEE);  Surgeon: Larey Dresser, MD;  Location: [facility];  Service: Cardiovascular;  Laterality: N/A;   TEE WITHOUT CARDIOVERSION N/A 10/15/2015   Procedure: TRANSESOPHAGEAL ECHOCARDIOGRAM (TEE);  Surgeon: Rexene Alberts, MD;  Location: [facility];  Service: Open Heart Surgery;  Laterality: N/A;   WISDOM TOOTH EXTRACTION  1975    Current Medications: Current Meds  Medication Sig   acyclovir (ZOVIRAX) 200 MG capsule TAKE 3 CAPSULES BY MOUTH IN THE MORNING, 2 IN THE EVENING UNTIL WELL AND THEN TAKE 1 DAILY   Ascorbic Acid (VITAMIN C) 500 MG CAPS Take 500 mg by mouth daily.   aspirin 81 MG tablet Take 81 mg by mouth daily. Reported on 10/08/2015   cetirizine (ZYRTEC) 10 MG tablet Take 10 mg by mouth daily.   lisinopril (ZESTRIL) 5 MG tablet TAKE 1 TABLET BY MOUTH EVERY DAY IN THE MORNING   magnesium oxide (MAG-OX) 400 MG tablet Take 400 mg by mouth daily.   metoprolol succinate (TOPROL-XL) 25 MG 24 hr tablet TAKE 1/2 TABLET BY MOUTH EVERY DAY   Multiple  Vitamins-Minerals (MULTIVITAL PO) Take 1 tablet by mouth daily.     Allergies:   Patient has no known allergies.   Social History   Socioeconomic History   Marital status: Married    Spouse name: Not on file   Number of children: Not on file   Years of education: Not on file   Highest education level: Not on file  Occupational History   Not on file  Tobacco Use   Smoking status: Never   Smokeless tobacco: Never  Vaping Use   Vaping Use: Not on file  Substance and Sexual Activity   Alcohol use: Yes    Alcohol/week: 2.0 standard drinks    Types: 2 Glasses of wine per week    Comment: socially   Drug use: No   Sexual activity: Not on file  Other Topics Concern   Not on file  Social History Narrative   Not on file   Social Determinants of Health   Financial  Resource Strain: Not on file  Food Insecurity: Not on file  Transportation Needs: Not on file  Physical Activity: Not on file  Stress: Not on file  Social Connections: Not on file     Family History: The patient's family history includes Congestive Heart Failure in her mother; Diabetes in her mother and another family member; Hypertension in her father and another family member; Lymphoma in an other family member; Non-Hodgkin's lymphoma in her mother; Other in her maternal grandmother; Renal cancer in her paternal grandmother; Rheumatic fever in her mother. There is no history of Colon cancer or Stomach cancer.  ROS:   Please see the history of present illness.    Review of Systems  Constitutional:  Positive for malaise/fatigue. Negative for weight loss.  HENT:  Negative for congestion.   Eyes:  Negative for double vision.  Respiratory:  Negative for cough and shortness of breath.   Cardiovascular:  Negative for chest pain, palpitations, orthopnea, claudication, leg swelling and PND.  Gastrointestinal:  Negative for heartburn and nausea.  Musculoskeletal:  Positive for joint pain (L foot). Negative for neck pain.   Skin:  Negative for itching and rash.  Neurological:  Positive for dizziness. Negative for headaches.  Endo/Heme/Allergies:  Does not bruise/bleed easily.  Psychiatric/Behavioral:  Negative for depression. The patient has insomnia.    All other systems reviewed and negative.   EKGs/Labs/Other Studies Reviewed:    The following studies were reviewed today: Echo 07/07/21  1. Left ventricular ejection fraction by 3D volume is 56 %. The left  ventricle has normal function. The left ventricle has no regional wall  motion abnormalities. Left ventricular diastolic parameters are consistent  with Grade II diastolic dysfunction (pseudonormalization). The average left ventricular global longitudinal strain is -16.6 %. The global longitudinal strain is abnormal.   2. Right ventricular systolic function is normal. The right ventricular  size is normal. There is normal pulmonary artery systolic pressure. The  estimated right ventricular systolic pressure is 38.2 mmHg.   3. The mitral valve has been repaired. Severe mitral valve regurgitation.  Moderate mitral stenosis. The mean mitral valve gradient is 6.0 mmHg.  There is a 38 Edwards Physio II ring annuloplasty present in the mitral  position. Procedure Date: 10/15/15.   4. The aortic valve is normal in structure. Aortic valve regurgitation is  not visualized. No aortic stenosis is present.   5. The inferior vena cava is normal in size with greater than 50%  respiratory variability, suggesting right atrial pressure of 3 mmHg.   Comparison(s): Prior images reviewed side by side. 06/23/20 EF 60-65%.  Moderate MR. MV 63mHg peak, 736mg mean PG.   Echo (OR) 10/15/15 - Left ventricle: The cavity size was dilated. Wall thickness was    normal. Systolic function was normal. The estimated ejection    fraction was = 55%. Wall motion was normal; there were no    regional wall motion abnormalities.  - Mitral valve: Dilated annulus. Moderately thickened  leaflets .    Severe myxomatous degeneration. No evidence of vegetation. There    was moderate regurgitation.  - Left atrium: No evidence of thrombus in the atrial cavity or    appendage.  - Atrial septum: No defect or patent foramen ovale was identified.   Doppler Pre-CABG 10/10/15 Findings consistent with 1-39 percent stenosis involving the right  internal carotid artery and the left internal carotid artery.  Prepared and Electronically Authenticated by   R/L Cath and Coronary Angio 08/18/15 1. Normal left  right heart filling pressures with prominent v-waves noted on PCWP pressure tracing.  °2. No angiographic coronary artery disease.  °3. 3+ mitral regurgitation noted.  °  °Will refer for evaluation for minimally invasive mitral valve repair. ° °EKG:   °07/10/21: Sinus rhythm, rate 75 bpm; L atrium enlargement ° °Recent Labs: °05/26/2021: ALT 15; BUN 22; Creatinine 1.16; Hemoglobin 15.4; Platelet Count 179; Potassium 4.5; Sodium 137  ° °Recent Lipid Panel °   °Component Value Date/Time  ° CHOL 251 (H) 05/19/2020 1021  ° TRIG 83 05/19/2020 1021  ° HDL 100 05/19/2020 1021  ° CHOLHDL 2.5 05/19/2020 1021  ° VLDL 13.8 06/18/2014 0856  ° LDLCALC 133 (H) 05/19/2020 1021  ° LDLDIRECT 104.4 08/13/2010 0835  ° ° °CHA2DS2-VASc Score =   [ ].  Therefore, the patient's annual risk of stroke is   %.    °  ° ° °Physical Exam:   ° °VS:  BP 104/62    Pulse 75    Ht 5' 9" (1.753 m)    Wt 134 lb 3.2 oz (60.9 kg)    SpO2 99%    BMI 19.82 kg/m²    ° °Wt Readings from Last 3 Encounters:  °07/10/21 134 lb 3.2 oz (60.9 kg)  °05/26/21 135 lb 9.6 oz (61.5 kg)  °11/24/20 135 lb (61.2 kg)  °  ° °GEN:  Well nourished, well developed in no acute distress °HEENT: Normal °NECK: No JVD; No carotid bruits °CARDIAC: RRR, 2/6 holosystolic murmur best heard at the LV apex, no rubs or gallops °RESPIRATORY:  Clear to auscultation without rales, wheezing or rhonchi  °ABDOMEN: Soft, non-tender, non-distended °MUSCULOSKELETAL:  No edema; No  deformity  °SKIN: Warm and dry °NEUROLOGIC:  Alert and oriented x 3 °PSYCHIATRIC:  Normal affect  ° °ASSESSMENT:   ° °1. Severe mitral regurgitation   °2. S/P mitral valve repair   °3. Essential hypertension   °4. Hyperlipidemia, unspecified hyperlipidemia type   °5. Chronic systolic heart failure (HCC)   °6. Moderate mitral stenosis   ° °PLAN:   ° °In order of problems listed above: ° °#History of severe MR s/p MV repair with 38 Edwards Physio II annuloplasty ring in 2017: °#Recurrent Severe MR  °#Moderate MS: °Patient with history of myxomatous mitral valve disease with bileaflet prolapse and severe MR s/p minimally invasive mitral valve repair with annuloplasty ring in 2017 with Dr. Owen. Over the past several months, the patient has noticed generalized malaise, fatigue and occasional lightheadedness. TTE 07/07/21 showed EF 56%, severe MR, moderate MS prompting her to come today. Currently, she is euvolemic and compensated on exam. BP well controlled with SBP in 100s. Will plan for TEE prior to RHC/LHC and TCTS referral. °-Plan for TEE °-Once TEE completed and if confirms severe MR, plan for RHC/LHC and TCTS referral °-Continue lisinopril 5mg daily °-Continue metop 12.5mg daily °-Euvolemic on exam and not on diuretics ° °#History of NICM with Improved EF: °Ef dropped to 45% post MVR but improved to 56% on most recent TTE. Clinically euvolemic. °-Continue lisinopril 5mg daily °-Continue metop 12.5mg daily °-Daily weights ° °#HTN: °Very well controled and at goal. °-Continue lisinopril 5mg daily °-Continue metop 12.5mg daily ° °#HLD: °Pre-op cath in 2017 without obstructive disease. LDL 133 not currently on statin. °-Will plan for statin therapy if unable to lower cholesterol with lifestyle modifications °-If cath with evidence of disease, plan to start crestor ° °#History of Multiple Myeloma c/b CKD: °-Followed by Dr. Sherrill ° °  ° °Medication   Adjustments/Labs and Tests Ordered: °Current medicines are reviewed  at length with the patient today.  Concerns regarding medicines are outlined above.  °Orders Placed This Encounter  °Procedures  ° EKG 12-Lead  ° °No orders of the defined types were placed in this encounter. ° °I,Mykaella Javier,acting as a scribe for Shayleigh Bouldin E Laythan Hayter, MD.,have documented all relevant documentation on the behalf of Braelynne Garinger E Azekiel Cremer, MD,as directed by  Ara Grandmaison E Sherryl Valido, MD while in the presence of Exodus Kutzer E Shaiann Mcmanamon, MD. ° °I, Markeem Noreen E Amiyrah Lamere, MD, have reviewed all documentation for this visit. The documentation on 07/10/21 for the exam, diagnosis, procedures, and orders are all accurate and complete.  ° °Signed, °Jadelin Eng E Maleek Craver, MD  °07/10/2021 9:04 AM    °[facility] ° °

## 2021-07-10 NOTE — Patient Instructions (Signed)
Medication Instructions:   Your physician recommends that you continue on your current medications as directed. Please refer to the Current Medication list given to you today.  *If you need a refill on your cardiac medications before your next appointment, please call your pharmacy*   Testing/Procedures:   You are scheduled for a TEE  on Wednesday 07/29/21  with Dr. Johnsie Cancel.  Please arrive at the [facility] (Main Entrance A) at [facility]: [address] [place], [place] [address] at 11 am  (1 hour prior to procedure unless lab work is needed; if lab work is needed arrive 1.5 hours ahead)  DIET: Nothing to eat or drink after midnight except a sip of water with medications (see medication instructions below)  FYI: For your safety, and to allow [place] to monitor your vital signs accurately during the surgery/procedure we request that   if you have artificial nails, gel coating, SNS etc. Please have those removed prior to your surgery/procedure. Not having the nail coverings /polish removed may result in cancellation or delay of your surgery/procedure.   Medication Instructions: Hold YOU MAY TAKE YOUR MEDS OR HOLD  Continue your anticoagulant: ASPIRIN You will need to continue your anticoagulant after your procedure until you  are told by your  Provider that it is safe to stop   Labs: If patient is on Coumadin, patient needs pt/INR, CBC, BMET within 3 days (No pt/INR needed for patients taking Xarelto, Eliquis, Pradaxa) For patients receiving anesthesia for TEE and all Cardioversion patients: BMET, CBC within 1 week  Come to:  (Lab option #3) your lab work will be done at the hospital prior to your procedure - you will need to arrive 1  hours ahead of your procedure ARRIVE AT [facility] TEE  You must have a responsible person to drive you home and stay in the waiting area during your procedure. Failure to do so could result in cancellation.  Bring  your insurance cards.  *Special Note: Every effort is made to have your procedure done on time. Occasionally there are emergencies that occur at the hospital that may cause delays. Please be patient if a delay does occur.     Follow-Up:  3 MONTHS IN THE OFFICE WITH DR. Johney Frame OR AN EXTENDER

## 2021-07-14 ENCOUNTER — Other Ambulatory Visit: Payer: Self-pay

## 2021-07-14 MED ORDER — METOPROLOL SUCCINATE ER 25 MG PO TB24: 12.5000 mg | ORAL_TABLET | Freq: Every day | ORAL | 3 refills | Status: DC

## 2021-07-21 ENCOUNTER — Encounter (HOSPITAL_COMMUNITY): Payer: Self-pay | Admitting: Cardiovascular Disease

## 2021-07-22 DIAGNOSIS — I10 Essential (primary) hypertension: Secondary | ICD-10-CM | POA: Diagnosis not present

## 2021-07-22 DIAGNOSIS — I340 Nonrheumatic mitral (valve) insufficiency: Secondary | ICD-10-CM | POA: Diagnosis not present

## 2021-07-22 DIAGNOSIS — N183 Chronic kidney disease, stage 3 unspecified: Secondary | ICD-10-CM | POA: Diagnosis not present

## 2021-07-29 ENCOUNTER — Ambulatory Visit (HOSPITAL_BASED_OUTPATIENT_CLINIC_OR_DEPARTMENT_OTHER)
Admission: RE | Admit: 2021-07-29 | Discharge: 2021-07-29 | Disposition: A | Discharge status: Home / Self Care | Payer: BC Managed Care – PPO | Source: Ambulatory Visit | Attending: Cardiology | Admitting: Cardiology

## 2021-07-29 ENCOUNTER — Ambulatory Visit (HOSPITAL_COMMUNITY): Payer: BC Managed Care – PPO | Admitting: Anesthesiology

## 2021-07-29 ENCOUNTER — Encounter (HOSPITAL_COMMUNITY): Payer: Self-pay | Admitting: Cardiovascular Disease

## 2021-07-29 ENCOUNTER — Encounter (HOSPITAL_COMMUNITY)
Admission: RE | Disposition: A | Discharge status: Home / Self Care | Payer: Self-pay | Source: Home / Self Care | Attending: Cardiovascular Disease

## 2021-07-29 ENCOUNTER — Ambulatory Visit (HOSPITAL_COMMUNITY)
Admission: RE | Admit: 2021-07-29 | Discharge: 2021-07-29 | Disposition: A | Discharge status: Home / Self Care | Payer: BC Managed Care – PPO | Attending: Cardiovascular Disease | Admitting: Cardiovascular Disease

## 2021-07-29 ENCOUNTER — Other Ambulatory Visit: Payer: Self-pay

## 2021-07-29 DIAGNOSIS — I110 Hypertensive heart disease with heart failure: Secondary | ICD-10-CM | POA: Diagnosis not present

## 2021-07-29 DIAGNOSIS — N189 Chronic kidney disease, unspecified: Secondary | ICD-10-CM | POA: Diagnosis not present

## 2021-07-29 DIAGNOSIS — I3139 Other pericardial effusion (noninflammatory): Secondary | ICD-10-CM | POA: Insufficient documentation

## 2021-07-29 DIAGNOSIS — I130 Hypertensive heart and chronic kidney disease with heart failure and stage 1 through stage 4 chronic kidney disease, or unspecified chronic kidney disease: Secondary | ICD-10-CM | POA: Diagnosis not present

## 2021-07-29 DIAGNOSIS — I088 Other rheumatic multiple valve diseases: Secondary | ICD-10-CM | POA: Diagnosis not present

## 2021-07-29 DIAGNOSIS — I342 Nonrheumatic mitral (valve) stenosis: Secondary | ICD-10-CM | POA: Diagnosis not present

## 2021-07-29 DIAGNOSIS — E785 Hyperlipidemia, unspecified: Secondary | ICD-10-CM | POA: Insufficient documentation

## 2021-07-29 DIAGNOSIS — I340 Nonrheumatic mitral (valve) insufficiency: Secondary | ICD-10-CM | POA: Diagnosis not present

## 2021-07-29 DIAGNOSIS — Z85528 Personal history of other malignant neoplasm of kidney: Secondary | ICD-10-CM | POA: Diagnosis not present

## 2021-07-29 DIAGNOSIS — I5022 Chronic systolic (congestive) heart failure: Secondary | ICD-10-CM | POA: Insufficient documentation

## 2021-07-29 DIAGNOSIS — I509 Heart failure, unspecified: Secondary | ICD-10-CM | POA: Diagnosis not present

## 2021-07-29 HISTORY — PX: TEE WITHOUT CARDIOVERSION: SHX5443

## 2021-07-29 LAB — ECHO TEE
AR max vel: 2.37 cm2
AV Area VTI: 3.16 cm2
AV Area mean vel: 2.42 cm2
AV Mean grad: 2 mmHg
AV Peak grad: 3.5 mmHg
Ao pk vel: 0.93 m/s
Area-P 1/2: 1.9 cm2
MV VTI: 1.15 cm2

## 2021-07-29 SURGERY — ECHOCARDIOGRAM, TRANSESOPHAGEAL
Anesthesia: General

## 2021-07-29 MED ORDER — PROPOFOL 500 MG/50ML IV EMUL
INTRAVENOUS | Status: DC | PRN
  Administered 2021-07-29: 125 ug/kg/min via INTRAVENOUS

## 2021-07-29 MED ORDER — LIDOCAINE 2% (20 MG/ML) 5 ML SYRINGE
INTRAMUSCULAR | Status: DC | PRN
  Administered 2021-07-29: 60 mg via INTRAVENOUS

## 2021-07-29 MED ORDER — SODIUM CHLORIDE 0.9 % IV SOLN: INTRAVENOUS | Status: DC

## 2021-07-29 MED ORDER — PHENYLEPHRINE HCL-NACL 20-0.9 MG/250ML-% IV SOLN
INTRAVENOUS | Status: DC | PRN
  Administered 2021-07-29: 50 ug/min via INTRAVENOUS

## 2021-07-29 NOTE — Transfer of Care (Signed)
Immediate Anesthesia Transfer of Care Note  Patient: Cristina Garcia  Procedure(s) Performed: TRANSESOPHAGEAL ECHOCARDIOGRAM (TEE)  Patient Location: Endoscopy Unit  Anesthesia Type:MAC  Level of Consciousness: awake, alert  and oriented  Airway & Oxygen Therapy: Patient Spontanous Breathing  Post-op Assessment: Report given to RN and Post -op Vital signs reviewed and stable  Post vital signs: Reviewed and stable  Last Vitals:  Vitals Value Taken Time  BP 85/50 07/29/21 1314  Temp 37.1 C 07/29/21 1312  Pulse 70 07/29/21 1316  Resp 15 07/29/21 1316  SpO2 97 % 07/29/21 1316  Vitals shown include unvalidated device data.  Last Pain:  Vitals:   07/29/21 1312  TempSrc: Temporal  PainSc: 0-No pain         Complications: No notable events documented.

## 2021-07-29 NOTE — Progress Notes (Signed)
°  Echocardiogram Echocardiogram Transesophageal has been performed.  Darlina Sicilian M 07/29/2021, 2:04 PM

## 2021-07-29 NOTE — Interval H&P Note (Signed)
History and Physical Interval Note:  07/29/2021 11:44 AM  Cristina Garcia  has presented today for surgery, with the diagnosis of [place].  The various methods of treatment have been discussed with the patient and family. After consideration of risks, benefits and other options for treatment, the patient has consented to  Procedure(s): TRANSESOPHAGEAL ECHOCARDIOGRAM (TEE) (N/A) as a surgical intervention.  The patient's history has been reviewed, patient examined, no change in status, stable for surgery.  I have reviewed the patient's chart and labs.  Questions were answered to the patient's satisfaction.     Jenkins Rouge

## 2021-07-29 NOTE — CV Procedure (Signed)
TEE: Anesthesia: Propofol  Post complex mitral repair with artificial chords, sliding annuloplasty, leaflet Reduction , ring and Alfieri stitch. Mild residual MR and mild MS. Dual orifice  Valve with MR from medial opening MVA around 1.9 cm2 Normal EF 55% Normal AV No ASD/PFO No LAA thrombus No effusion Normal TV mild TR Normal PV mild PR  See full report in [place]  Jenkins Rouge MD [facility]

## 2021-07-29 NOTE — Anesthesia Postprocedure Evaluation (Signed)
Anesthesia Post Note  Patient: Cristina Garcia  Procedure(s) Performed: TRANSESOPHAGEAL ECHOCARDIOGRAM (TEE)     Patient location during evaluation: Endoscopy Anesthesia Type: General Level of consciousness: awake Pain management: pain level controlled Vital Signs Assessment: post-procedure vital signs reviewed and stable Respiratory status: spontaneous breathing Cardiovascular status: stable Postop Assessment: no apparent nausea or vomiting Anesthetic complications: no   No notable events documented.  Last Vitals:  Vitals:   07/29/21 1321 07/29/21 1328  BP: (!) 92/52 111/65  Pulse: (!) 57 60  Resp: (!) 21 14  Temp:    SpO2: 99% 98%    Last Pain:  Vitals:   07/29/21 1321  TempSrc:   PainSc: 0-No pain                 Tanice Petre

## 2021-07-29 NOTE — Discharge Instructions (Signed)

## 2021-07-29 NOTE — Anesthesia Procedure Notes (Signed)
Procedure Name: MAC Date/Time: 07/29/2021 12:45 PM Performed by: Griffin Dakin, CRNA Pre-anesthesia Checklist: Emergency Drugs available, Suction available, Patient identified, Patient being monitored and Timeout performed Patient Re-evaluated:Patient Re-evaluated prior to induction Oxygen Delivery Method: Nasal cannula Induction Type: IV induction Placement Confirmation: positive ETCO2 and breath sounds checked- equal and bilateral Dental Injury: Teeth and Oropharynx as per pre-operative assessment

## 2021-07-29 NOTE — Anesthesia Preprocedure Evaluation (Addendum)
Anesthesia Evaluation  Patient identified by MRN, date of birth, ID band Patient awake    Reviewed: Allergy & Precautions, Patient's Chart, lab work & pertinent test results, reviewed documented beta blocker date and time   Airway Mallampati: II  TM Distance: >3 FB     Dental   Pulmonary neg pulmonary ROS,    breath sounds clear to auscultation       Cardiovascular hypertension, Pt. on medications and Pt. on home beta blockers +CHF (grade 2 diastolic dysfunction)  + Valvular Problems/Murmurs (severe MR s/p MV repair 2017, mod MS) MR  Rhythm:Regular Rate:Normal  Echo 06/2021: 1. Left ventricular ejection fraction by 3D volume is 56 %. The left  ventricle has normal function. The left ventricle has no regional wall  motion abnormalities. Left ventricular diastolic parameters are consistent  with Grade II diastolic dysfunction  (pseudonormalization). The average left ventricular global longitudinal  strain is -16.6 %. The global longitudinal strain is abnormal.  2. Right ventricular systolic function is normal. The right ventricular  size is normal. There is normal pulmonary artery systolic pressure. The  estimated right ventricular systolic pressure is 00.7 mmHg.  3. The mitral valve has been repaired. Severe mitral valve regurgitation.  Moderate mitral stenosis. The mean mitral valve gradient is 6.0 mmHg.  There is a 38 Edwards Physio II ring annuloplasty present in the mitral  position. Procedure Date: 10/15/15.  4. The aortic valve is normal in structure. Aortic valve regurgitation is  not visualized. No aortic stenosis is present.  5. The inferior vena cava is normal in size with greater than 50%  respiratory variability, suggesting right atrial pressure of 3 mmHg.    Neuro/Psych PSYCHIATRIC DISORDERS Anxiety negative neurological ROS     GI/Hepatic negative GI ROS, Neg liver ROS,   Endo/Other  negative endocrine ROS   Renal/GU Renal InsufficiencyRenal diseaseCr 1.16  negative genitourinary   Musculoskeletal negative musculoskeletal ROS (+)   Abdominal   Peds  Hematology negative hematology ROS (+)   Anesthesia Other Findings   Reproductive/Obstetrics negative OB ROS                            Anesthesia Physical Anesthesia Plan  ASA: 4  Anesthesia Plan: General   Post-op Pain Management:    Induction:   PONV Risk Score and Plan: 2 and Propofol infusion  Airway Management Planned: Natural Airway and Simple Face Mask  Additional Equipment: None  Intra-op Plan:   Post-operative Plan:   Informed Consent: I have reviewed the patients History and Physical, chart, labs and discussed the procedure including the risks, benefits and alternatives for the proposed anesthesia with the patient or authorized representative who has indicated his/her understanding and acceptance.     Dental advisory given  Plan Discussed with: Anesthesiologist and CRNA  Anesthesia Plan Comments:        Anesthesia Quick Evaluation

## 2021-07-30 ENCOUNTER — Encounter (HOSPITAL_COMMUNITY): Payer: Self-pay | Admitting: Cardiovascular Disease

## 2021-07-30 ENCOUNTER — Encounter: Payer: Self-pay | Admitting: Cardiology

## 2021-08-10 ENCOUNTER — Encounter: Payer: Self-pay | Admitting: Family Medicine

## 2021-08-18 ENCOUNTER — Encounter: Payer: Self-pay | Admitting: Oncology

## 2021-08-31 ENCOUNTER — Encounter: Payer: Self-pay | Admitting: Gastroenterology

## 2021-09-04 ENCOUNTER — Ambulatory Visit: Payer: BC Managed Care – PPO | Admitting: Cardiology

## 2021-10-06 NOTE — Progress Notes (Deleted)
?Cardiology Office Note:   ? ?Date:  10/06/2021  ? ?ID:  DESTINIE THORNSBERRY, DOB [birth year], MRN [id number] ? ?PCP:  Caren Macadam, MD  ?Cardiologist:  Ena Dawley, MD   ? ?Referring MD: Caren Macadam, MD  ? ?No chief complaint on file. ? ? ?History of Present Illness:   ? ?Cristina Garcia is a 61 y.o. female with a hx of of mitral valve prolapse, mitral regurgitation, hypertension, and CKD presents for follow-up previously seen by Dr. Meda Coffee.   ? ?Per review of record, patient had a long history of murmur and was told for years that she has mitral valve prolapse.  Echo in 1/15 showed bileaflet prolapse and probably moderate MR.  TEE was done in 3/15 and showed moderate to severe MR from bileaflet mitral valve prolapse with normal LV size and no pulmonary vein systolic doppler flow reversal.  She had repeat echo in 6/16.  It continued to show moderate to severe mid to late systolic mitral regurgitation in the setting of MVP.  EF 60%, mild LV dilation, moderate LAE.  Echo in 2/17 showed severe MR due to MVP. Last winter, she developed exertional dyspnea. TEE confirmed severe MR from bileaflet prolapse. LHC showed no significant CAD.  In 5/17, she had a complex minimally invasive mitral valve repair by Dr Roxy Manns.  She had Alfieri edge-to-edge repair and annuloplasty.  Post-operatively, she required a right thoracentesis. Echo in 6/17 (post-op) showed EF 45%, mild LV dilation, stable repaired mitral valve with no MR or MS.  PASP 48 mmHg.  ? ?She saw Ermalinda Barrios via virtual visit on 08/04/2020 where she was dealing with a lot of stress at work and had mild palpitations when laying down at night but no HF symptoms. She ordered a yearly TTE for monitoring which was performed on 07/07/21 and revealed severe MR, moderate MS with mean gradient 6, EF 56%, G2DD, GLS -16.6 (mildly reduced).  ? ?Was seen in follow-up in 06/2021 given TTE findings as detailed above where she was feeling off and suffering from more  fatigue. TEE 07/2021 with LVEF 50-55%, moderate LAE, moderate RAE, s/p mitral annuloplasty with alfiere stitch with mild/mod MR, mild MS and mild TR. ? ?Today, *** ? ?Past Medical History:  ?Diagnosis Date  ? Allergy   ? Anxiety   ? Atrial tachycardia ([place]) 12/18/2014  ? 24 hour event monitor with frequent PAC's, PVC's and short runs of atrial tachycardia - no sustained arrhythmias or atrial fibrillation   ? Chronic kidney disease (CKD)   ? Constipation   ? H/O multiple myeloma   ? clinical remission  ? Hypertension   ? Mitral regurgitation   ? Mitral valve prolapse   ? Osteoporosis   ? Pleural effusion, right 11/03/2015  ? Pleural effusion, right 11/03/2015  ? S/P minimally invasive mitral valve repair 10/15/2015  ? Complex valvuloplasty including Alfieri edge-to-edge repair, generous quadrangular resection (shortening) of posterior leaflet, sliding leaflet plasty, Gore-tex neochord placement x6, and 38 mm Edwards Physio II ring annuloplasty via right mini thoracotomy approach   ? ? ?Past Surgical History:  ?Procedure Laterality Date  ? BREAST ENHANCEMENT SURGERY  2000  ? CARDIAC CATHETERIZATION N/A 08/18/2015  ? Procedure: Right/Left Heart Cath and Coronary Angiography;  Surgeon: Larey Dresser, MD;  Location: [place] [facility];  Service: Cardiovascular;  Laterality: N/A;  ? [place]  ? HAMMERTOE RECONSTRUCTION WITH WEIL OSTEOTOMY Left 09/09/2016  ? Procedure: Left 2-4 MT Weil osteotomies; Left 2nd hammertoe  correction and extensor tendon lengthing;  Surgeon: Wylene Simmer, MD;  Location: [place];  Service: Orthopedics;  Laterality: Left;  ? LIMBAL STEM CELL TRANSPLANT  2008  ? MITRAL VALVE REPAIR Right 10/15/2015  ? Procedure: MINIMALLY INVASIVE MITRAL VALVE REPAIR (MVR);  Surgeon: Rexene Alberts, MD;  Location: [place];  Service: Open Heart Surgery;  Laterality: Right;  ? OOPHORECTOMY  1991  ? left; got cyst on ovary with torsion when pregnant.  ? TEE WITHOUT CARDIOVERSION N/A 09/05/2013  ?  Procedure: TRANSESOPHAGEAL ECHOCARDIOGRAM (TEE);  Surgeon: Larey Dresser, MD;  Location: [place];  Service: Cardiovascular;  Laterality: N/A;  ? TEE WITHOUT CARDIOVERSION N/A 08/18/2015  ? Procedure: TRANSESOPHAGEAL ECHOCARDIOGRAM (TEE);  Surgeon: Larey Dresser, MD;  Location: [place];  Service: Cardiovascular;  Laterality: N/A;  ? TEE WITHOUT CARDIOVERSION N/A 10/15/2015  ? Procedure: TRANSESOPHAGEAL ECHOCARDIOGRAM (TEE);  Surgeon: Rexene Alberts, MD;  Location: [place];  Service: Open Heart Surgery;  Laterality: N/A;  ? TEE WITHOUT CARDIOVERSION N/A 07/29/2021  ? Procedure: TRANSESOPHAGEAL ECHOCARDIOGRAM (TEE);  Surgeon: Josue Hector, MD;  Location: [facility];  Service: Cardiovascular;  Laterality: N/A;  ? [place]  ? ? ?Current Medications: ?No outpatient medications have been marked as taking for the 10/12/21 encounter (Appointment) with Freada Bergeron, MD.  ?  ? ?Allergies:   Patient has no known allergies.  ? ?Social History  ? ?Socioeconomic History  ? Marital status: Married  ?  Spouse name: Not on file  ? Number of children: Not on file  ? Years of education: Not on file  ? Highest education level: Not on file  ?Occupational History  ? Not on file  ?Tobacco Use  ? Smoking status: Never  ? Smokeless tobacco: Never  ?Vaping Use  ? Vaping Use: Not on file  ?Substance and Sexual Activity  ? Alcohol use: Yes  ?  Alcohol/week: 2.0 standard drinks  ?  Types: 2 Glasses of wine per week  ?  Comment: socially  ? Drug use: No  ? Sexual activity: Not on file  ?Other Topics Concern  ? Not on file  ?Social History Narrative  ? Not on file  ? ?Social Determinants of Health  ? ?Financial Resource Strain: Not on file  ?Food Insecurity: Not on file  ?Transportation Needs: Not on file  ?Physical Activity: Not on file  ?Stress: Not on file  ?Social Connections: Not on file  ?  ? ?Family History: ?The patient's family history includes Congestive Heart Failure in her mother; Diabetes in her  mother and another family member; Hypertension in her father and another family member; Lymphoma in an other family member; Non-Hodgkin's lymphoma in her mother; Other in her maternal grandmother; Renal cancer in her paternal grandmother; Rheumatic fever in her mother. There is no history of Colon cancer or Stomach cancer. ? ?ROS:   ?Please see the history of present illness.    ?Review of Systems  ?Constitutional:  Positive for malaise/fatigue. Negative for weight loss.  ?HENT:  Negative for congestion.   ?Eyes:  Negative for double vision.  ?Respiratory:  Negative for cough and shortness of breath.   ?Cardiovascular:  Negative for chest pain, palpitations, orthopnea, claudication, leg swelling and PND.  ?Gastrointestinal:  Negative for heartburn and nausea.  ?Musculoskeletal:  Positive for joint pain (L foot). Negative for neck pain.  ?Skin:  Negative for itching and rash.  ?Neurological:  Positive for dizziness. Negative for headaches.  ?Endo/Heme/Allergies:  Does not bruise/bleed easily.  ?Psychiatric/Behavioral:  Negative for depression. The patient has insomnia.    ?All other systems reviewed and negative.  ? ?EKGs/Labs/Other Studies Reviewed:   ? ?The following studies were reviewed today: ?TEE 07/2021: ?IMPRESSIONS  ? 1. Left ventricular ejection fraction, by estimation, is 50 to 55%. The  ?left ventricle has normal function. Left ventricular diastolic parameters  ?are indeterminate.  ? 2. Right ventricular systolic function is normal. The right ventricular  ?size is normal.  ? 3. Left atrial size was moderately dilated. No left atrial/left atrial  ?appendage thrombus was detected.  ? 4. Right atrial size was moderately dilated.  ? 5. The pericardial effusion is surrounding the apex.  ? 6. Post complex repair 2017. Artifical chords to posterior medial  ?papillary muscle , annuloplasty ring and Alfieri stitch. Dual orrifice  ?with mild/moderate MR and mild MS  ?    MR emminates from medial orrifice . The  mitral valve has been  ?repaired/replaced. Mild to moderate mitral valve regurgitation. Mild  ?mitral stenosis.  ? 7. Mild prolapse . The tricuspid valve is myxomatous.  ? 8. The aortic valve is tricuspid. Ao

## 2021-10-12 ENCOUNTER — Ambulatory Visit: Payer: BC Managed Care – PPO | Admitting: Cardiology

## 2021-10-12 ENCOUNTER — Encounter: Payer: Self-pay | Admitting: Cardiology

## 2021-10-12 VITALS — BP 106/70 | HR 72 | Ht 69.0 in | Wt 134.2 lb

## 2021-10-12 DIAGNOSIS — I340 Nonrheumatic mitral (valve) insufficiency: Secondary | ICD-10-CM

## 2021-10-12 DIAGNOSIS — E785 Hyperlipidemia, unspecified: Secondary | ICD-10-CM

## 2021-10-12 DIAGNOSIS — Z9889 Other specified postprocedural states: Secondary | ICD-10-CM

## 2021-10-12 DIAGNOSIS — I5022 Chronic systolic (congestive) heart failure: Secondary | ICD-10-CM

## 2021-10-12 DIAGNOSIS — I10 Essential (primary) hypertension: Secondary | ICD-10-CM | POA: Diagnosis not present

## 2021-10-12 NOTE — Patient Instructions (Signed)
Medication Instructions:  ? ?Your physician recommends that you continue on your current medications as directed. Please refer to the Current Medication list given to you today. ? ?*If you need a refill on your cardiac medications before your next appointment, please call your pharmacy* ? ? ?Testing/Procedures: ? ?Your physician has requested that you have an echocardiogram. Echocardiography is a painless test that uses sound waves to create images of your heart. It provides your doctor with information about the size and shape of your heart and how well your heart?s chambers and valves are working. This procedure takes approximately one hour. There are no restrictions for this procedure.  SCHEDULE TO BE DONE IN AUGUST 2023 PER DR. Johney Frame ? ? ? ?Follow-Up: ?At [facility], you and your health needs are our priority.  As part of our continuing mission to provide you with exceptional heart care, we have created designated Provider Care Teams.  These Care Teams include your primary Cardiologist (physician) and Advanced Practice Providers (APPs -  Physician Assistants and Nurse Practitioners) who all work together to provide you with the care you need, when you need it. ? ?We recommend signing up for the patient portal called "MyChart".  Sign up information is provided on this After Visit Summary.  MyChart is used to connect with patients for Virtual Visits (Telemedicine).  Patients are able to view lab/test results, encounter notes, upcoming appointments, etc.  Non-urgent messages can be sent to your provider as well.   ?To learn more about what you can do with MyChart, go to [email or web address].   ? ?Your next appointment:   ?6 month(s) ? ?The format for your next appointment:   ?In Person ? ?Provider:   ?DR. PEMBERTON ? ?Important Information About Sugar ? ? ? ? ? ? ?

## 2021-10-12 NOTE — Progress Notes (Signed)
?Cardiology Office Note:   ? ?Date:  10/12/2021  ? ?ID:  Cristina Garcia, DOB Oct 23, 1960, MRN [id number] ? ?PCP:  Caren Macadam, MD  ?Cardiologist:  Ena Dawley, MD   ? ?Referring MD: Caren Macadam, MD  ? ?No chief complaint on file. ? ? ?History of Present Illness:   ? ?Cristina Garcia is a 61 y.o. female with a hx of of mitral valve prolapse, mitral regurgitation, hypertension, and CKD presents for follow-up previously seen by Dr. Meda Coffee.   ? ?Per review of record, patient had a long history of murmur and was told for years that she has mitral valve prolapse.  Echo in 1/15 showed bileaflet prolapse and probably moderate MR.  TEE was done in 3/15 and showed moderate to severe MR from bileaflet mitral valve prolapse with normal LV size and no pulmonary vein systolic doppler flow reversal.  She had repeat echo in 6/16.  It continued to show moderate to severe mid to late systolic mitral regurgitation in the setting of MVP.  EF 60%, mild LV dilation, moderate LAE.  Echo in 2/17 showed severe MR due to MVP. Last winter, she developed exertional dyspnea. TEE confirmed severe MR from bileaflet prolapse. LHC showed no significant CAD.  In 5/17, she had a complex minimally invasive mitral valve repair by Dr Roxy Manns.  She had Alfieri edge-to-edge repair and annuloplasty.  Post-operatively, she required a right thoracentesis. Echo in 6/17 (post-op) showed EF 45%, mild LV dilation, stable repaired mitral valve with no MR or MS.  PASP 48 mmHg.  ? ?She saw Ermalinda Barrios via virtual visit on 08/04/2020 where she was dealing with a lot of stress at work and had mild palpitations when laying down at night but no HF symptoms. She ordered a yearly TTE for monitoring which was performed on 07/07/21 and revealed severe MR, moderate MS with mean gradient 6, EF 56%, G2DD, GLS -16.6 (mildly reduced).  ? ?Was seen in follow-up in 06/2021 given TTE findings as detailed above where she was feeling off and suffering from more  fatigue. TEE 07/2021 with LVEF 50-55%, moderate LAE, moderate RAE, s/p mitral annuloplasty with alfiere stitch with mild/mod MR, mild MS and mild TR. ? ?Today, the patient states that she has been confused by her TTE and TEE results, and we reviewed this in detail. ? ?She continues to experience strange sensations in her central chest that are difficult to describe. She believes these sensations are related to her mitral valve. Continues to have intermittent palpitations at night but no sustained episodes. No known episodes of Afib. Declined repeat monitor. ? ?Her lightheadedness was worse in January 2023. Lately this has improved significantly, but still seems to be present. She is also feeling some fatigue.  ? ?Currently she is taking metoprolol at night, and lisinopril in the morning. ? ?She is trying to work on diet to control her cholesterol. Plans to repeat labs with PCP.  ? ?She denies any shortness of breath, or peripheral edema. No headaches, syncope, orthopnea, or PND. ? ? ?Past Medical History:  ?Diagnosis Date  ? Allergy   ? Anxiety   ? Atrial tachycardia ([place]) 12/18/2014  ? 24 hour event monitor with frequent PAC's, PVC's and short runs of atrial tachycardia - no sustained arrhythmias or atrial fibrillation   ? Chronic kidney disease (CKD)   ? Constipation   ? H/O multiple myeloma   ? clinical remission  ? Hypertension   ? Mitral regurgitation   ? Mitral valve  prolapse   ? Osteoporosis   ? Pleural effusion, right 11/03/2015  ? Pleural effusion, right 11/03/2015  ? S/P minimally invasive mitral valve repair 10/15/2015  ? Complex valvuloplasty including Alfieri edge-to-edge repair, generous quadrangular resection (shortening) of posterior leaflet, sliding leaflet plasty, Gore-tex neochord placement x6, and 38 mm Edwards Physio II ring annuloplasty via right mini thoracotomy approach   ? ? ?Past Surgical History:  ?Procedure Laterality Date  ? BREAST ENHANCEMENT SURGERY  2000  ? CARDIAC CATHETERIZATION N/A  08/18/2015  ? Procedure: Right/Left Heart Cath and Coronary Angiography;  Surgeon: Larey Dresser, MD;  Location: [place] [facility];  Service: Cardiovascular;  Laterality: N/A;  ? [place]  ? HAMMERTOE RECONSTRUCTION WITH WEIL OSTEOTOMY Left 09/09/2016  ? Procedure: Left 2-4 MT Weil osteotomies; Left 2nd hammertoe correction and extensor tendon lengthing;  Surgeon: Wylene Simmer, MD;  Location: [place];  Service: Orthopedics;  Laterality: Left;  ? LIMBAL STEM CELL TRANSPLANT  2008  ? MITRAL VALVE REPAIR Right 10/15/2015  ? Procedure: MINIMALLY INVASIVE MITRAL VALVE REPAIR (MVR);  Surgeon: Rexene Alberts, MD;  Location: [place];  Service: Open Heart Surgery;  Laterality: Right;  ? OOPHORECTOMY  1991  ? left; got cyst on ovary with torsion when pregnant.  ? TEE WITHOUT CARDIOVERSION N/A 09/05/2013  ? Procedure: TRANSESOPHAGEAL ECHOCARDIOGRAM (TEE);  Surgeon: Larey Dresser, MD;  Location: [place];  Service: Cardiovascular;  Laterality: N/A;  ? TEE WITHOUT CARDIOVERSION N/A 08/18/2015  ? Procedure: TRANSESOPHAGEAL ECHOCARDIOGRAM (TEE);  Surgeon: Larey Dresser, MD;  Location: [place];  Service: Cardiovascular;  Laterality: N/A;  ? TEE WITHOUT CARDIOVERSION N/A 10/15/2015  ? Procedure: TRANSESOPHAGEAL ECHOCARDIOGRAM (TEE);  Surgeon: Rexene Alberts, MD;  Location: [place];  Service: Open Heart Surgery;  Laterality: N/A;  ? TEE WITHOUT CARDIOVERSION N/A 07/29/2021  ? Procedure: TRANSESOPHAGEAL ECHOCARDIOGRAM (TEE);  Surgeon: Josue Hector, MD;  Location: [facility]COPY;  Service: Cardiovascular;  Laterality: N/A;  ? [place]  ? ? ?Current Medications: ?Current Meds  ?Medication Sig  ? acyclovir (ZOVIRAX) 200 MG capsule TAKE 3 CAPSULES BY MOUTH IN THE MORNING, 2 IN THE EVENING UNTIL WELL AND THEN TAKE 1 DAILY  ? Ascorbic Acid (VITAMIN C) 1000 MG tablet Take 1,000 mg by mouth daily.  ? aspirin 81 MG tablet Take 81 mg by mouth daily.  ? cetirizine (ZYRTEC) 10 MG tablet Take  10 mg by mouth daily.  ? cholecalciferol (VITAMIN D3) 25 MCG (1000 UNIT) tablet Take 1,000 Units by mouth daily.  ? lisinopril (ZESTRIL) 5 MG tablet TAKE 1 TABLET BY MOUTH EVERY DAY IN THE MORNING  ? Magnesium Oxide 250 MG TABS Take 250 mg by mouth daily.  ? metoprolol succinate (TOPROL-XL) 25 MG 24 hr tablet Take 0.5 tablets (12.5 mg total) by mouth daily.  ? Multiple Vitamins-Minerals (MULTIVITAL PO) Take 1 tablet by mouth daily.  ?  ? ?Allergies:   Patient has no known allergies.  ? ?Social History  ? ?Socioeconomic History  ? Marital status: Married  ?  Spouse name: Not on file  ? Number of children: Not on file  ? Years of education: Not on file  ? Highest education level: Not on file  ?Occupational History  ? Not on file  ?Tobacco Use  ? Smoking status: Never  ? Smokeless tobacco: Never  ?Vaping Use  ? Vaping Use: Not on file  ?Substance and Sexual Activity  ? Alcohol use: Yes  ?  Alcohol/week: 2.0 standard drinks  ?  Types: 2 Glasses of wine per week  ?  Comment: socially  ? Drug use: No  ? Sexual activity: Not on file  ?Other Topics Concern  ? Not on file  ?Social History Narrative  ? Not on file  ? ?Social Determinants of Health  ? ?Financial Resource Strain: Not on file  ?Food Insecurity: Not on file  ?Transportation Needs: Not on file  ?Physical Activity: Not on file  ?Stress: Not on file  ?Social Connections: Not on file  ?  ? ?Family History: ?The patient's family history includes Congestive Heart Failure in her mother; Diabetes in her mother and another family member; Hypertension in her father and another family member; Lymphoma in an other family member; Non-Hodgkin's lymphoma in her mother; Other in her maternal grandmother; Renal cancer in her paternal grandmother; Rheumatic fever in her mother. There is no history of Colon cancer or Stomach cancer. ? ?ROS:   ?Please see the history of present illness.    ?Review of Systems  ?Constitutional:  Positive for malaise/fatigue. Negative for weight loss.   ?HENT:  Negative for congestion.   ?Eyes:  Negative for double vision.  ?Respiratory:  Negative for cough and shortness of breath.   ?Cardiovascular:  Positive for palpitations. Negative for chest pain, orth

## 2021-11-24 ENCOUNTER — Inpatient Hospital Stay: Payer: BC Managed Care – PPO

## 2021-11-24 ENCOUNTER — Inpatient Hospital Stay: Payer: BC Managed Care – PPO | Admitting: Oncology

## 2021-12-25 ENCOUNTER — Other Ambulatory Visit: Payer: Self-pay

## 2021-12-25 ENCOUNTER — Telehealth: Payer: Self-pay

## 2021-12-25 ENCOUNTER — Inpatient Hospital Stay: Payer: BC Managed Care – PPO | Attending: Oncology

## 2021-12-25 ENCOUNTER — Inpatient Hospital Stay: Payer: BC Managed Care – PPO | Admitting: Oncology

## 2021-12-25 VITALS — BP 106/76 | HR 73 | Temp 98.2°F | Resp 18 | Ht 69.0 in | Wt 130.0 lb

## 2021-12-25 DIAGNOSIS — I341 Nonrheumatic mitral (valve) prolapse: Secondary | ICD-10-CM | POA: Diagnosis not present

## 2021-12-25 DIAGNOSIS — C9000 Multiple myeloma not having achieved remission: Secondary | ICD-10-CM | POA: Diagnosis not present

## 2021-12-25 DIAGNOSIS — K625 Hemorrhage of anus and rectum: Secondary | ICD-10-CM | POA: Diagnosis not present

## 2021-12-25 DIAGNOSIS — C9001 Multiple myeloma in remission: Secondary | ICD-10-CM

## 2021-12-25 DIAGNOSIS — I340 Nonrheumatic mitral (valve) insufficiency: Secondary | ICD-10-CM | POA: Diagnosis not present

## 2021-12-25 DIAGNOSIS — M8580 Other specified disorders of bone density and structure, unspecified site: Secondary | ICD-10-CM | POA: Insufficient documentation

## 2021-12-25 DIAGNOSIS — N189 Chronic kidney disease, unspecified: Secondary | ICD-10-CM | POA: Diagnosis not present

## 2021-12-25 LAB — CBC WITH DIFFERENTIAL (CANCER CENTER ONLY)
Abs Immature Granulocytes: 0.01 10*3/uL (ref 0.00–0.07)
Basophils Absolute: 0 10*3/uL (ref 0.0–0.1)
Basophils Relative: 0 %
Eosinophils Absolute: 0.1 10*3/uL (ref 0.0–0.5)
Eosinophils Relative: 1 %
HCT: 45.6 % (ref 36.0–46.0)
Hemoglobin: 15.2 g/dL — ABNORMAL HIGH (ref 12.0–15.0)
Immature Granulocytes: 0 %
Lymphocytes Relative: 25 %
Lymphs Abs: 1.3 10*3/uL (ref 0.7–4.0)
MCH: 32.2 pg (ref 26.0–34.0)
MCHC: 33.3 g/dL (ref 30.0–36.0)
MCV: 96.6 fL (ref 80.0–100.0)
Monocytes Absolute: 0.5 10*3/uL (ref 0.1–1.0)
Monocytes Relative: 9 %
Neutro Abs: 3.2 10*3/uL (ref 1.7–7.7)
Neutrophils Relative %: 65 %
Platelet Count: 165 10*3/uL (ref 150–400)
RBC: 4.72 MIL/uL (ref 3.87–5.11)
RDW: 12.2 % (ref 11.5–15.5)
WBC Count: 5 10*3/uL (ref 4.0–10.5)
nRBC: 0 % (ref 0.0–0.2)

## 2021-12-25 LAB — CMP (CANCER CENTER ONLY)
ALT: 15 U/L (ref 0–44)
AST: 21 U/L (ref 15–41)
Albumin: 4.7 g/dL (ref 3.5–5.0)
Alkaline Phosphatase: 51 U/L (ref 38–126)
Anion gap: 9 (ref 5–15)
BUN: 26 mg/dL — ABNORMAL HIGH (ref 8–23)
CO2: 31 mmol/L (ref 22–32)
Calcium: 10.8 mg/dL — ABNORMAL HIGH (ref 8.9–10.3)
Chloride: 100 mmol/L (ref 98–111)
Creatinine: 1.27 mg/dL — ABNORMAL HIGH (ref 0.44–1.00)
GFR, Estimated: 48 mL/min — ABNORMAL LOW (ref >60)
Glucose, Bld: 84 mg/dL (ref 70–99)
Potassium: 4.6 mmol/L (ref 3.5–5.1)
Sodium: 140 mmol/L (ref 135–145)
Total Bilirubin: 1 mg/dL (ref 0.3–1.2)
Total Protein: 6.9 g/dL (ref 6.5–8.1)

## 2021-12-25 NOTE — Telephone Encounter (Signed)
-----   Message from Ladell Pier, MD sent at 12/25/2021  2:43 PM EDT ----- Please call patient, the calcium level returned mildly elevated, likely normal variation, return for a BMP in 1-2 weeks

## 2021-12-25 NOTE — Telephone Encounter (Signed)
Pt verbalized understanding. Lab appointment made for 01/01/22

## 2021-12-25 NOTE — Progress Notes (Signed)
  [facility] OFFICE PROGRESS NOTE   Diagnosis: Multiple myeloma  INTERVAL HISTORY:   Cristina Garcia returns as scheduled.  She feels well.  She is walking and biking for exercise.  She is followed by cardiology for evaluation of the mitral valve.  She is scheduled for a repeat TTE next month.  She has mild intermittent discomfort at the hips.  Objective:  Vital signs in last 24 hours:  Blood pressure 106/76, pulse 73, temperature 98.2 F (36.8 C), temperature source Oral, resp. rate 18, height _0  (1.753 m), weight 130 lb (59 kg), SpO2 100 %.    Resp: Lungs clear bilaterally Cardio: Regular rate and rhythm GI: No hepatosplenomegaly Vascular: No leg edema  Lab Results:  Lab Results  Component Value Date   WBC 5.0 12/25/2021   HGB 15.2 (H) 12/25/2021   HCT 45.6 12/25/2021   MCV 96.6 12/25/2021   PLT 165 12/25/2021   NEUTROABS 3.2 12/25/2021    CMP  Lab Results  Component Value Date   NA 137 05/26/2021   K 4.5 05/26/2021   CL 98 05/26/2021   CO2 32 05/26/2021   GLUCOSE 77 05/26/2021   BUN 22 05/26/2021   CREATININE 1.16 (H) 05/26/2021   CALCIUM 10.3 05/26/2021   PROT 7.5 05/26/2021   ALBUMIN 4.7 05/26/2021   AST 22 05/26/2021   ALT 15 05/26/2021   ALKPHOS 53 05/26/2021   BILITOT 1.1 05/26/2021   GFRNONAA 53 (L) 05/26/2021   GFRAA 43 (L) 12/04/2019    Lab Results  Component Value Date   CEA 0.7 04/19/2006   CA125 28.1 11/24/2006     Medications: I have reviewed the patient's current medications.   Assessment/Plan:  Multiple myeloma diagnosed in November 2007, IgA kappa, status post high-dose chemotherapy with autologous stem cell support at [facility] in June 2008. She remains in clinical remission.  history of Renal insufficiency secondary to multiple myeloma, now with chronic renal insufficiency History of hypokalemia. History of left pelvic pain secondary to a lytic left iliac lesion. A metastatic bone survey  04/20/2017 revealed stable  lucencies at the left sacral ala and medial left iliac, questionable new tiny lucency at the superior aspect of the left sacral ala Simple cyst at the lower pole of the right kidney on a renal ultrasound, 11/08/2006. Multiseptated cyst of the left adnexa on an ultrasound, 11/08/2006. Osteopenia Mitral valve prolapse/mitral regurgitation -minimally invasive mitral valve repair 10/15/2015 Report of intermittent rectal bleeding. Normal colonoscopy by Dr. Fuller Plan on 08/20/2011.    Disposition: Cristina Garcia remains in clinical remission from multiple myeloma.  We will follow-up on the myeloma panel from today.  She will return for an office and lab visit in 6 months.  She will remain up-to-date on influenza, COVID-19, and pneumococcal vaccines.  She will continue follow-up with cardiology for evaluation of mitral regurgitation.  Cristina Coder, MD  12/25/2021  11:15 AM

## 2021-12-26 LAB — IGG, IGA, IGM
IgA: 114 mg/dL (ref 87–352)
IgG (Immunoglobin G), Serum: 944 mg/dL (ref 586–1602)
IgM (Immunoglobulin M), Srm: 87 mg/dL (ref 26–217)

## 2021-12-28 LAB — KAPPA/LAMBDA LIGHT CHAINS
Kappa free light chain: 22.2 mg/L — ABNORMAL HIGH (ref 3.3–19.4)
Kappa, lambda light chain ratio: 1.55 (ref 0.26–1.65)
Lambda free light chains: 14.3 mg/L (ref 5.7–26.3)

## 2022-01-01 ENCOUNTER — Inpatient Hospital Stay: Payer: BC Managed Care – PPO

## 2022-01-01 ENCOUNTER — Telehealth: Payer: Self-pay

## 2022-01-01 ENCOUNTER — Other Ambulatory Visit: Payer: Self-pay | Admitting: *Deleted

## 2022-01-01 DIAGNOSIS — I10 Essential (primary) hypertension: Secondary | ICD-10-CM

## 2022-01-01 DIAGNOSIS — M8580 Other specified disorders of bone density and structure, unspecified site: Secondary | ICD-10-CM | POA: Diagnosis not present

## 2022-01-01 DIAGNOSIS — I341 Nonrheumatic mitral (valve) prolapse: Secondary | ICD-10-CM | POA: Diagnosis not present

## 2022-01-01 DIAGNOSIS — I340 Nonrheumatic mitral (valve) insufficiency: Secondary | ICD-10-CM | POA: Diagnosis not present

## 2022-01-01 DIAGNOSIS — C9000 Multiple myeloma not having achieved remission: Secondary | ICD-10-CM | POA: Diagnosis not present

## 2022-01-01 DIAGNOSIS — N189 Chronic kidney disease, unspecified: Secondary | ICD-10-CM | POA: Diagnosis not present

## 2022-01-01 DIAGNOSIS — C9001 Multiple myeloma in remission: Secondary | ICD-10-CM

## 2022-01-01 DIAGNOSIS — K625 Hemorrhage of anus and rectum: Secondary | ICD-10-CM | POA: Diagnosis not present

## 2022-01-01 LAB — BASIC METABOLIC PANEL
Anion gap: 9 (ref 5–15)
BUN: 25 mg/dL — ABNORMAL HIGH (ref 8–23)
CO2: 27 mmol/L (ref 22–32)
Calcium: 10 mg/dL (ref 8.9–10.3)
Chloride: 101 mmol/L (ref 98–111)
Creatinine, Ser: 1.29 mg/dL — ABNORMAL HIGH (ref 0.44–1.00)
GFR, Estimated: 47 mL/min — ABNORMAL LOW (ref >60)
Glucose, Bld: 83 mg/dL (ref 70–99)
Potassium: 4.4 mmol/L (ref 3.5–5.1)
Sodium: 137 mmol/L (ref 135–145)

## 2022-01-01 MED ORDER — LISINOPRIL 5 MG PO TABS: ORAL_TABLET | ORAL | 0 refills | Status: DC

## 2022-01-01 NOTE — Telephone Encounter (Signed)
-----   Message from Ladell Pier, MD sent at 01/01/2022 11:08 AM EDT ----- Please call patient, the calcium is normal, follow-up as scheduled

## 2022-01-01 NOTE — Telephone Encounter (Signed)
Pt verbalized understanding.

## 2022-01-25 ENCOUNTER — Ambulatory Visit (HOSPITAL_COMMUNITY): Payer: BC Managed Care – PPO | Attending: Cardiology

## 2022-01-25 DIAGNOSIS — I340 Nonrheumatic mitral (valve) insufficiency: Secondary | ICD-10-CM

## 2022-01-25 DIAGNOSIS — Z9889 Other specified postprocedural states: Secondary | ICD-10-CM | POA: Insufficient documentation

## 2022-01-25 LAB — ECHOCARDIOGRAM COMPLETE
Area-P 1/2: 2.44 cm2
MV VTI: 1 cm2
S' Lateral: 2.9 cm

## 2022-01-27 ENCOUNTER — Telehealth: Payer: Self-pay | Admitting: *Deleted

## 2022-01-27 DIAGNOSIS — I340 Nonrheumatic mitral (valve) insufficiency: Secondary | ICD-10-CM

## 2022-01-27 DIAGNOSIS — I050 Rheumatic mitral stenosis: Secondary | ICD-10-CM

## 2022-01-27 DIAGNOSIS — Z9889 Other specified postprocedural states: Secondary | ICD-10-CM

## 2022-01-27 NOTE — Telephone Encounter (Signed)
The patient has been notified of the result and verbalized understanding.  All questions (if any) were answered.  Pt aware that I will go ahead and order her echo for a year out and send a message to our Echo Scheduler to call her back and arrange this appt for that time.  Pt verbalized understanding and agrees with this plan.

## 2022-01-27 NOTE — Telephone Encounter (Signed)
-----   Message from Freada Bergeron, MD sent at 01/26/2022  6:53 PM EDT ----- Her echo looks stable. The pumping function remains low normal and her mitral valve shows mild-to-moderate leakiness and narrowing which is stable. Will continue to monitor with yearly echoes going forward.

## 2022-03-30 ENCOUNTER — Other Ambulatory Visit: Payer: Self-pay | Admitting: Family

## 2022-03-30 DIAGNOSIS — I10 Essential (primary) hypertension: Secondary | ICD-10-CM

## 2022-04-05 ENCOUNTER — Telehealth: Payer: Self-pay

## 2022-04-05 DIAGNOSIS — I10 Essential (primary) hypertension: Secondary | ICD-10-CM

## 2022-04-05 MED ORDER — LISINOPRIL 5 MG PO TABS: ORAL_TABLET | ORAL | 0 refills | Status: DC

## 2022-04-05 NOTE — Telephone Encounter (Signed)
Yes, please refill lisinopril under Dr. Johney Frame. Thanks!

## 2022-04-05 NOTE — Telephone Encounter (Signed)
Pt calling stating that her PCP is no longer at the practice and would like to know if Dr. Johney Frame would refill her medication lisinopril? Pt would like a call back concerning this matter. Please address

## 2022-04-05 NOTE — Telephone Encounter (Signed)
Caller states she has a prescription and her doctor is no longer with the practice, her pharmacy said they reached out and was not able to get the prescription re-prescribed. She is wondering what she should do next.  04/02/2022 2:06:33 PM Call PCP within [place], RN, Sherre Poot  Last OV   04/05/22 at 12: Pt declines to schedule appt with new PCP in office. States she will reach out to cardiologist to get medication. She is aware of PCPs that are accepting new patients in office. She will call with her decision at a later date.

## 2022-04-05 NOTE — Telephone Encounter (Signed)
Pt's medication was sent to pt's pharmacy as requested. Confirmation received.  °

## 2022-04-15 NOTE — Progress Notes (Deleted)
Cardiology Office Note:    Date:  04/16/2022   ID:  Cristina Garcia, DOB 1960/09/29, MRN [id number]  PCP:  Caren Macadam, MD (Inactive)  Cardiologist:  Ena Dawley, MD    Referring MD: Caren Macadam, MD   No chief complaint on file.   History of Present Illness:    Cristina Garcia is a 61 y.o. female with a hx of of mitral valve prolapse, mitral regurgitation, hypertension, and CKD presents for follow-up previously seen by Dr. Meda Coffee.    Per review of record, patient had a long history of murmur and was told for years that she has mitral valve prolapse.  Echo in 1/15 showed bileaflet prolapse and probably moderate MR.  TEE was done in 3/15 and showed moderate to severe MR from bileaflet mitral valve prolapse with normal LV size and no pulmonary vein systolic doppler flow reversal.  She had repeat echo in 6/16.  It continued to show moderate to severe mid to late systolic mitral regurgitation in the setting of MVP.  EF 60%, mild LV dilation, moderate LAE.  Echo in 2/17 showed severe MR due to MVP. Last winter, she developed exertional dyspnea. TEE confirmed severe MR from bileaflet prolapse. LHC showed no significant CAD.  In 5/17, she had a complex minimally invasive mitral valve repair by Dr Roxy Manns.  She had Alfieri edge-to-edge repair and annuloplasty.  Post-operatively, she required a right thoracentesis. Echo in 6/17 (post-op) showed EF 45%, mild LV dilation, stable repaired mitral valve with no MR or MS.  PASP 48 mmHg.   She saw Ermalinda Barrios via virtual visit on 08/04/2020 where she was dealing with a lot of stress at work and had mild palpitations when laying down at night but no HF symptoms. She ordered a yearly TTE for monitoring which was performed on 07/07/21 and revealed severe MR, moderate MS with mean gradient 6, EF 56%, G2DD, GLS -16.6 (mildly reduced).   Was seen in follow-up in 06/2021 given TTE findings as detailed above where she was feeling off and suffering  from more fatigue. TEE 07/2021 with LVEF 50-55%, moderate LAE, moderate RAE, s/p mitral annuloplasty with alfiere stitch with mild/mod MR, mild MS and mild TR.  Was last seen in clinic on 10/2021 where she was overall stable. Continued to have intermittent palpitations that were stable. She declined cardiac monitor at that time. Repeat TTE 01/25/22 showed mild to moderate MR and moderate MS with mean gradient 7.80mHg. EF 50-55%   Past Medical History:  Diagnosis Date   Allergy    Anxiety    Atrial tachycardia 12/18/2014   24 hour event monitor with frequent PAC's, PVC's and short runs of atrial tachycardia - no sustained arrhythmias or atrial fibrillation    Chronic kidney disease (CKD)    Constipation    H/O multiple myeloma    clinical remission   Hypertension    Mitral regurgitation    Mitral valve prolapse    Osteoporosis    Pleural effusion, right 11/03/2015   Pleural effusion, right 11/03/2015   S/P minimally invasive mitral valve repair 10/15/2015   Complex valvuloplasty including Alfieri edge-to-edge repair, generous quadrangular resection (shortening) of posterior leaflet, sliding leaflet plasty, Gore-tex neochord placement x6, and 38 mm Edwards Physio II ring annuloplasty via right mini thoracotomy approach     Past Surgical History:  Procedure Laterality Date   BREAST ENHANCEMENT SURGERY  2000   CARDIAC CATHETERIZATION N/A 08/18/2015   Procedure: Right/Left Heart Cath and Coronary Angiography;  Surgeon: Larey Dresser, MD;  Location: [facility];  Service: Cardiovascular;  Laterality: N/A;   CESAREAN SECTION  1992   HAMMERTOE RECONSTRUCTION WITH WEIL OSTEOTOMY Left 09/09/2016   Procedure: Left 2-4 MT Weil osteotomies; Left 2nd hammertoe correction and extensor tendon lengthing;  Surgeon: Wylene Simmer, MD;  Location: [facility];  Service: Orthopedics;  Laterality: Left;   LIMBAL STEM CELL TRANSPLANT  2008   MITRAL VALVE REPAIR Right 10/15/2015   Procedure:  MINIMALLY INVASIVE MITRAL VALVE REPAIR (MVR);  Surgeon: Rexene Alberts, MD;  Location: [facility];  Service: Open Heart Surgery;  Laterality: Right;   OOPHORECTOMY  1991   left; got cyst on ovary with torsion when pregnant.   TEE WITHOUT CARDIOVERSION N/A 09/05/2013   Procedure: TRANSESOPHAGEAL ECHOCARDIOGRAM (TEE);  Surgeon: Larey Dresser, MD;  Location: [facility];  Service: Cardiovascular;  Laterality: N/A;   TEE WITHOUT CARDIOVERSION N/A 08/18/2015   Procedure: TRANSESOPHAGEAL ECHOCARDIOGRAM (TEE);  Surgeon: Larey Dresser, MD;  Location: [facility];  Service: Cardiovascular;  Laterality: N/A;   TEE WITHOUT CARDIOVERSION N/A 10/15/2015   Procedure: TRANSESOPHAGEAL ECHOCARDIOGRAM (TEE);  Surgeon: Rexene Alberts, MD;  Location: [facility];  Service: Open Heart Surgery;  Laterality: N/A;   TEE WITHOUT CARDIOVERSION N/A 07/29/2021   Procedure: TRANSESOPHAGEAL ECHOCARDIOGRAM (TEE);  Surgeon: Josue Hector, MD;  Location: [facility];  Service: Cardiovascular;  Laterality: N/A;   WISDOM TOOTH EXTRACTION  1975    Current Medications: Current Meds  Medication Sig   aspirin 81 MG tablet Take 81 mg by mouth daily.   cetirizine (ZYRTEC) 10 MG tablet Take 10 mg by mouth daily.   lisinopril (ZESTRIL) 5 MG tablet TAKE 1 TABLET BY MOUTH EVERY DAY IN THE MORNING   Magnesium Oxide 250 MG TABS Take 250 mg by mouth daily.   Multiple Vitamins-Minerals (MULTIVITAL PO) Take 1 tablet by mouth daily.     Allergies:   Patient has no known allergies.   Social History   Socioeconomic History   Marital status: Married    Spouse name: Not on file   Number of children: Not on file   Years of education: Not on file   Highest education level: Not on file  Occupational History   Not on file  Tobacco Use   Smoking status: Never   Smokeless tobacco: Never  Vaping Use   Vaping Use: Not on file  Substance and Sexual Activity   Alcohol use: Yes    Alcohol/week: 2.0 standard drinks of alcohol    Types: 2  Glasses of wine per week    Comment: socially   Drug use: No   Sexual activity: Not on file  Other Topics Concern   Not on file  Social History Narrative   Not on file   Social Determinants of Health   Financial Resource Strain: Not on file  Food Insecurity: Not on file  Transportation Needs: Not on file  Physical Activity: Not on file  Stress: Not on file  Social Connections: Not on file     Family History: The patient's family history includes Congestive Heart Failure in her mother; Diabetes in her mother and another family member; Hypertension in her father and another family member; Lymphoma in an other family member; Non-Hodgkin's lymphoma in her mother; Other in her maternal grandmother; Renal cancer in her paternal grandmother; Rheumatic fever in her mother. There is no history of Colon cancer or Stomach cancer.  ROS:   Please see the history  of present illness.    Review of Systems  Constitutional:  Positive for malaise/fatigue. Negative for weight loss.  HENT:  Negative for congestion.   Eyes:  Negative for double vision.  Respiratory:  Negative for cough and shortness of breath.   Cardiovascular:  Positive for palpitations. Negative for chest pain, orthopnea, claudication, leg swelling and PND.  Gastrointestinal:  Negative for heartburn and nausea.  Musculoskeletal:  Positive for joint pain (L foot). Negative for neck pain.  Skin:  Negative for itching and rash.  Neurological:  Positive for dizziness. Negative for headaches.  Endo/Heme/Allergies:  Does not bruise/bleed easily.  Psychiatric/Behavioral:  Negative for depression. The patient has insomnia.     All other systems reviewed and negative.   EKGs/Labs/Other Studies Reviewed:    The following studies were reviewed today: TTE 02-03-22: IMPRESSIONS     1. Left ventricular ejection fraction, by estimation, is 50 to 55%. The  left ventricle has low normal function. The left ventricle demonstrates  global  hypokinesis. Left ventricular diastolic function could not be  evaluated.   2. Right ventricular systolic function is normal. The right ventricular  size is normal.   3. Left atrial size was mildly dilated.   4. The mitral valve has been repaired/replaced. Mild to moderate mitral  valve regurgitation. Moderate functional mitral stenosis. The mean mitral  valve gradient is 7.5 mmHg. There is a 48 Edwards present in the mitral  position. Procedure Date: 10/15/2015.   5. Tricuspid valve regurgitation is mild to moderate.   6. The aortic valve is normal in structure. Aortic valve regurgitation is  not visualized. No aortic stenosis is present.   7. The inferior vena cava is normal in size with greater than 50%  respiratory variability, suggesting right atrial pressure of 3 mmHg.   TEE 07/2021: IMPRESSIONS   1. Left ventricular ejection fraction, by estimation, is 50 to 55%. The  left ventricle has normal function. Left ventricular diastolic parameters  are indeterminate.   2. Right ventricular systolic function is normal. The right ventricular  size is normal.   3. Left atrial size was moderately dilated. No left atrial/left atrial  appendage thrombus was detected.   4. Right atrial size was moderately dilated.   5. The pericardial effusion is surrounding the apex.   6. Post complex repair 2017. Artifical chords to posterior medial  papillary muscle , annuloplasty ring and Alfieri stitch. Dual orrifice  with mild/moderate MR and mild MS      MR emminates from medial orrifice . The mitral valve has been  repaired/replaced. Mild to moderate mitral valve regurgitation. Mild  mitral stenosis.   7. Mild prolapse . The tricuspid valve is myxomatous.   8. The aortic valve is tricuspid. Aortic valve regurgitation is not  visualized. No aortic stenosis is present.   Echo 07/07/21  1. Left ventricular ejection fraction by 3D volume is 56 %. The left  ventricle has normal function. The left  ventricle has no regional wall  motion abnormalities. Left ventricular diastolic parameters are consistent  with Grade II diastolic dysfunction (pseudonormalization). The average left ventricular global longitudinal strain is -16.6 %. The global longitudinal strain is abnormal.   2. Right ventricular systolic function is normal. The right ventricular  size is normal. There is normal pulmonary artery systolic pressure. The  estimated right ventricular systolic pressure is 27.2 mmHg.   3. The mitral valve has been repaired. Severe mitral valve regurgitation.  Moderate mitral stenosis. The mean mitral valve gradient is  6.0 mmHg.  There is a 38 Edwards Physio II ring annuloplasty present in the mitral  position. Procedure Date: 10/15/15.   4. The aortic valve is normal in structure. Aortic valve regurgitation is  not visualized. No aortic stenosis is present.   5. The inferior vena cava is normal in size with greater than 50%  respiratory variability, suggesting right atrial pressure of 3 mmHg.   Comparison(s): Prior images reviewed side by side. 06/23/20 EF 60-65%.  Moderate MR. MV 29mHg peak, 761mg mean PG.   Echo (OR) 10/15/15 - Left ventricle: The cavity size was dilated. Wall thickness was    normal. Systolic function was normal. The estimated ejection    fraction was = 55%. Wall motion was normal; there were no    regional wall motion abnormalities.  - Mitral valve: Dilated annulus. Moderately thickened leaflets .    Severe myxomatous degeneration. No evidence of vegetation. There    was moderate regurgitation.  - Left atrium: No evidence of thrombus in the atrial cavity or    appendage.  - Atrial septum: No defect or patent foramen ovale was identified.   Doppler Pre-CABG 10/10/15 Findings consistent with 1-39 percent stenosis involving the right  internal carotid artery and the left internal carotid artery.  Prepared and Electronically Authenticated by   R/L Cath and Coronary Angio  08/18/15 1. Normal left and right heart filling pressures with prominent v-waves noted on PCWP pressure tracing.  2. No angiographic coronary artery disease.  3. 3+ mitral regurgitation noted.    Will refer for evaluation for minimally invasive mitral valve repair.  EKG:  EKG is personally reviewed. 10/12/2021: EKG was not ordered. 07/10/21: Sinus rhythm, rate 75 bpm; L atrium enlargement  Recent Labs: 12/25/2021: ALT 15; Hemoglobin 15.2; Platelet Count 165 01/01/2022: BUN 25; Creatinine, Ser 1.29; Potassium 4.4; Sodium 137   Recent Lipid Panel    Component Value Date/Time   CHOL 251 (H) 05/19/2020 1021   TRIG 83 05/19/2020 1021   HDL 100 05/19/2020 1021   CHOLHDL 2.5 05/19/2020 1021   VLDL 13.8 06/18/2014 0856   LDLCALC 133 (H) 05/19/2020 1021   LDLDIRECT 104.4 08/13/2010 0835    CHA2DS2-VASc Score =   _0 .  Therefore, the patient's annual risk of stroke is   %.        Physical Exam:    VS:  BP 118/62   Pulse 74   Ht _1  (1.753 m)   Wt 134 lb (60.8 kg)   SpO2 98%   BMI 19.79 kg/m     Wt Readings from Last 3 Encounters:  04/16/22 134 lb (60.8 kg)  12/25/21 130 lb (59 kg)  10/12/21 134 lb 3.2 oz (60.9 kg)     GEN:  Well nourished, well developed in no acute distress HEENT: Normal NECK: No JVD; No carotid bruits CARDIAC: RRR, 2/6 holosystolic murmur best heard at the LV apex, no rubs or gallops RESPIRATORY:  Clear to auscultation without rales, wheezing or rhonchi  ABDOMEN: Soft, non-tender, non-distended MUSCULOSKELETAL:  No edema; No deformity  SKIN: Warm and dry NEUROLOGIC:  Alert and oriented x 3 PSYCHIATRIC:  Normal affect   ASSESSMENT:    No diagnosis found.   PLAN:    In order of problems listed above:  #History of severe MR s/p MV repair with [id number] Physio II annuloplasty ring in 2017: #Mild-to-mod MR, mild MS: Patient with history of myxomatous mitral valve disease with bileaflet prolapse and severe MR s/p minimally invasive mitral  valve  repair with annuloplasty ring and alfiere stitch in 2017 with Dr. Roxy Manns. TTE 07/07/21 showed EF 56%, severe MR, moderate MS. TEE 07/2021 showed only mild-to-moderate MR and mild MS. Recommended for continued medical management -Check TTE in 01/2023 for serial monitoring -Continue lisinopril 41m daily -Continue metop 12.58mdaily -Euvolemic on exam and not on diuretics  #History of NICM with Improved EF: Ef dropped to 45% post MVR but improved to 50-55% on most recent TTE. Clinically euvolemic. -Continue lisinopril 63m64maily -Continue metop 12.63mg54mily -Daily weights  #HTN: Very well controlled and at goal. -Continue lisinopril 63mg 66mly -Continue metop 12.63mg d31my  #HLD: Pre-op cath in 2017 without obstructive disease. LDL 133 not currently on statin. -Plans to check lipids with new PCP -Lifestyle modifications  #History of Multiple Myeloma c/b CKD: -Followed by Dr. SherriBenay Spiceollow-up:  6 months.  Medication Adjustments/Labs and Tests Ordered: Current medicines are reviewed at length with the patient today.  Concerns regarding medicines are outlined above.   No orders of the defined types were placed in this encounter.  No orders of the defined types were placed in this encounter.   Signed, HeatheFreada Bergeron11/08/2021 8:45 AM    [facility]

## 2022-04-16 ENCOUNTER — Encounter: Payer: Self-pay | Admitting: Cardiology

## 2022-04-16 ENCOUNTER — Ambulatory Visit: Payer: BC Managed Care – PPO | Attending: Cardiology | Admitting: Cardiology

## 2022-04-16 VITALS — BP 118/62 | HR 74 | Ht 69.0 in | Wt 134.0 lb

## 2022-04-16 DIAGNOSIS — I493 Ventricular premature depolarization: Secondary | ICD-10-CM | POA: Diagnosis not present

## 2022-04-16 DIAGNOSIS — I5022 Chronic systolic (congestive) heart failure: Secondary | ICD-10-CM

## 2022-04-16 DIAGNOSIS — I050 Rheumatic mitral stenosis: Secondary | ICD-10-CM

## 2022-04-16 DIAGNOSIS — I340 Nonrheumatic mitral (valve) insufficiency: Secondary | ICD-10-CM | POA: Diagnosis not present

## 2022-04-16 DIAGNOSIS — Z9889 Other specified postprocedural states: Secondary | ICD-10-CM

## 2022-04-16 DIAGNOSIS — I10 Essential (primary) hypertension: Secondary | ICD-10-CM | POA: Diagnosis not present

## 2022-04-16 MED ORDER — METOPROLOL SUCCINATE ER 25 MG PO TB24: 12.5000 mg | ORAL_TABLET | Freq: Every day | ORAL | 3 refills | Status: DC

## 2022-04-16 MED ORDER — LISINOPRIL 5 MG PO TABS: ORAL_TABLET | ORAL | 3 refills | Status: DC

## 2022-04-16 NOTE — Patient Instructions (Addendum)
Medication Instructions:  Your physician recommends that you continue on your current medications as directed. Please refer to the Current Medication list given to you today.  *If you need a refill on your cardiac medications before your next appointment, please call your pharmacy*  Lab Work: If you have labs (blood work) drawn today and your tests are completely normal, you will receive your results only by: [place] (if you have MyChart) OR A paper copy in the mail If you have any lab test that is abnormal or we need to change your treatment, we will call you to review the results.  Testing/Procedures: Your physician has requested that you have a cardiac MRI in February. Cardiac MRI uses a computer to create images of your heart as its beating, producing both still and moving pictures of your heart and major blood vessels. For further information please visit [email or web address] Please follow the instruction sheet given to you today for more information.   Follow-Up: At [facility], you and your health needs are our priority.  As part of our continuing mission to provide you with exceptional heart care, we have created designated Provider Care Teams.  These Care Teams include your primary Cardiologist (physician) and Advanced Practice Providers (APPs -  Physician Assistants and Nurse Practitioners) who all work together to provide you with the care you need, when you need it.  We recommend signing up for the patient portal called "MyChart".  Sign up information is provided on this After Visit Summary.  MyChart is used to connect with patients for Virtual Visits (Telemedicine).  Patients are able to view lab/test results, encounter notes, upcoming appointments, etc.  Non-urgent messages can be sent to your provider as well.   To learn more about what you can do with MyChart, go to [email or web address].    Your next appointment:   In March  The format for your next  appointment:   In Person  Provider:   Freada Bergeron, MD     Important Information About Sugar

## 2022-04-16 NOTE — Progress Notes (Signed)
Cardiology Office Note:    Date:  04/16/2022   ID:  KEVONA LUPINACCI, DOB [birth year], MRN [id number]  PCP:  Caren Macadam, MD (Inactive)  Cardiologist:  Freada Bergeron, MD    Referring MD: Caren Macadam, MD   No chief complaint on file.   History of Present Illness:    Cristina Garcia is a 61 y.o. female with a hx of of mitral valve prolapse, mitral regurgitation, hypertension, and CKD presents for follow-up previously seen by Dr. Meda Coffee.    Per review of record, patient had a long history of murmur and was told for years that she has mitral valve prolapse.  Echo in 1/15 showed bileaflet prolapse and probably moderate MR.  TEE was done in 3/15 and showed moderate to severe MR from bileaflet mitral valve prolapse with normal LV size and no pulmonary vein systolic doppler flow reversal.  She had repeat echo in 6/16.  It continued to show moderate to severe mid to late systolic mitral regurgitation in the setting of MVP.  EF 60%, mild LV dilation, moderate LAE.  Echo in 2/17 showed severe MR due to MVP. Last winter, she developed exertional dyspnea. TEE confirmed severe MR from bileaflet prolapse. LHC showed no significant CAD.  In 5/17, she had a complex minimally invasive mitral valve repair by Dr Roxy Manns.  She had Alfieri edge-to-edge repair and annuloplasty.  Post-operatively, she required a right thoracentesis. Echo in 6/17 (post-op) showed EF 45%, mild LV dilation, stable repaired mitral valve with no MR or MS.  PASP 48 mmHg.   She saw Ermalinda Barrios via virtual visit on 08/04/2020 where she was dealing with a lot of stress at work and had mild palpitations when laying down at night but no HF symptoms. She ordered a yearly TTE for monitoring which was performed on 07/07/21 and revealed severe MR, moderate MS with mean gradient 6, EF 56%, G2DD, GLS -16.6 (mildly reduced).   Was seen in follow-up in 06/2021 given TTE findings as detailed above where she was feeling off and  suffering from more fatigue. TEE 07/2021 with LVEF 50-55%, moderate LAE, moderate RAE, s/p mitral annuloplasty with alfiere stitch with mild/mod MR, mild MS and mild TR.  Was last seen in clinic on 10/2021 where she was overall stable. Continued to have intermittent palpitations that were stable. She declined cardiac monitor at that time. Repeat TTE 01/25/22 showed mild to moderate MR and moderate MS with mean gradient 7.59mHg. EF 50-55%  Today, she says she is feeling okay. She continues to feel more wiped out than she used to be. She remains very active, however, and is able to walk >20,000 steps without symptoms. Every once in a while she experiences a heart flutter but no significant palpitations.   She reports that occasionally she will experience some mild lightheadedness. She says this is very different from how she used to experience lightheadedness, as it used to be more severe. She is watching her fluid intake closely.  She denies any chest pain, shortness of breath, or peripheral edema. No headaches, syncope, orthopnea, or PND. Tolerating medications without issue.  Past Medical History:  Diagnosis Date   Allergy    Anxiety    Atrial tachycardia 12/18/2014   24 hour event monitor with frequent PAC's, PVC's and short runs of atrial tachycardia - no sustained arrhythmias or atrial fibrillation    Chronic kidney disease (CKD)    Constipation    H/O multiple myeloma    clinical remission  Hypertension    Mitral regurgitation    Mitral valve prolapse    Osteoporosis    Pleural effusion, right 11/03/2015   Pleural effusion, right 11/03/2015   S/P minimally invasive mitral valve repair 10/15/2015   Complex valvuloplasty including Alfieri edge-to-edge repair, generous quadrangular resection (shortening) of posterior leaflet, sliding leaflet plasty, Gore-tex neochord placement x6, and 38 mm Edwards Physio II ring annuloplasty via right mini thoracotomy approach     Past Surgical History:   Procedure Laterality Date   BREAST ENHANCEMENT SURGERY  2000   CARDIAC CATHETERIZATION N/A 08/18/2015   Procedure: Right/Left Heart Cath and Coronary Angiography;  Surgeon: Larey Dresser, MD;  Location: [facility];  Service: Cardiovascular;  Laterality: N/A;   CESAREAN SECTION  1992   HAMMERTOE RECONSTRUCTION WITH WEIL OSTEOTOMY Left 09/09/2016   Procedure: Left 2-4 MT Weil osteotomies; Left 2nd hammertoe correction and extensor tendon lengthing;  Surgeon: Wylene Simmer, MD;  Location: [place];  Service: Orthopedics;  Laterality: Left;   LIMBAL STEM CELL TRANSPLANT  2008   MITRAL VALVE REPAIR Right 10/15/2015   Procedure: MINIMALLY INVASIVE MITRAL VALVE REPAIR (MVR);  Surgeon: Rexene Alberts, MD;  Location: [place];  Service: Open Heart Surgery;  Laterality: Right;   OOPHORECTOMY  1991   left; got cyst on ovary with torsion when pregnant.   TEE WITHOUT CARDIOVERSION N/A 09/05/2013   Procedure: TRANSESOPHAGEAL ECHOCARDIOGRAM (TEE);  Surgeon: Larey Dresser, MD;  Location: [place];  Service: Cardiovascular;  Laterality: N/A;   TEE WITHOUT CARDIOVERSION N/A 08/18/2015   Procedure: TRANSESOPHAGEAL ECHOCARDIOGRAM (TEE);  Surgeon: Larey Dresser, MD;  Location: [place];  Service: Cardiovascular;  Laterality: N/A;   TEE WITHOUT CARDIOVERSION N/A 10/15/2015   Procedure: TRANSESOPHAGEAL ECHOCARDIOGRAM (TEE);  Surgeon: Rexene Alberts, MD;  Location: [place];  Service: Open Heart Surgery;  Laterality: N/A;   TEE WITHOUT CARDIOVERSION N/A 07/29/2021   Procedure: TRANSESOPHAGEAL ECHOCARDIOGRAM (TEE);  Surgeon: Josue Hector, MD;  Location: [facility];  Service: Cardiovascular;  Laterality: N/A;   WISDOM TOOTH EXTRACTION  1975    Current Medications: Current Meds  Medication Sig   aspirin 81 MG tablet Take 81 mg by mouth daily.   cetirizine (ZYRTEC) 10 MG tablet Take 10 mg by mouth daily.   Magnesium Oxide 250 MG TABS Take 250 mg by mouth daily.   Multiple  Vitamins-Minerals (MULTIVITAL PO) Take 1 tablet by mouth daily.   [DISCONTINUED] lisinopril (ZESTRIL) 5 MG tablet TAKE 1 TABLET BY MOUTH EVERY DAY IN THE MORNING     Allergies:   Patient has no known allergies.   Social History   Socioeconomic History   Marital status: Married    Spouse name: Not on file   Number of children: Not on file   Years of education: Not on file   Highest education level: Not on file  Occupational History   Not on file  Tobacco Use   Smoking status: Never   Smokeless tobacco: Never  Vaping Use   Vaping Use: Not on file  Substance and Sexual Activity   Alcohol use: Yes    Alcohol/week: 2.0 standard drinks of alcohol    Types: 2 Glasses of wine per week    Comment: socially   Drug use: No   Sexual activity: Not on file  Other Topics Concern   Not on file  Social History Narrative   Not on file   Social Determinants of Health   Financial Resource Strain: Not on  file  Food Insecurity: Not on file  Transportation Needs: Not on file  Physical Activity: Not on file  Stress: Not on file  Social Connections: Not on file     Family History: The patient's family history includes Congestive Heart Failure in her mother; Diabetes in her mother and another family member; Hypertension in her father and another family member; Lymphoma in an other family member; Non-Hodgkin's lymphoma in her mother; Other in her maternal grandmother; Renal cancer in her paternal grandmother; Rheumatic fever in her mother. There is no history of Colon cancer or Stomach cancer.  ROS:   Please see the history of present illness.    Review of Systems  Constitutional:  Positive for malaise/fatigue. Negative for weight loss.  HENT:  Negative for congestion.   Eyes:  Negative for double vision.  Respiratory:  Negative for cough and shortness of breath.   Cardiovascular:  Positive for palpitations. Negative for chest pain, orthopnea, claudication, leg swelling and PND.   Gastrointestinal:  Negative for heartburn and nausea.  Musculoskeletal:  Positive for joint pain (L foot). Negative for neck pain.  Skin:  Negative for itching and rash.  Neurological:  Positive for dizziness. Negative for headaches.  Endo/Heme/Allergies:  Does not bruise/bleed easily.  Psychiatric/Behavioral:  Negative for depression. The patient has insomnia.     All other systems reviewed and negative.   EKGs/Labs/Other Studies Reviewed:    The following studies were reviewed today:  TTE 01/25/22: IMPRESSIONS    1. Left ventricular ejection fraction, by estimation, is 50 to 55%. The  left ventricle has low normal function. The left ventricle demonstrates  global hypokinesis. Left ventricular diastolic function could not be  evaluated.   2. Right ventricular systolic function is normal. The right ventricular  size is normal.   3. Left atrial size was mildly dilated.   4. The mitral valve has been repaired/replaced. Mild to moderate mitral  valve regurgitation. Moderate functional mitral stenosis. The mean mitral  valve gradient is 7.5 mmHg. There is a 16 Edwards present in the mitral  position. Procedure Date: 10/15/2015.   5. Tricuspid valve regurgitation is mild to moderate.   6. The aortic valve is normal in structure. Aortic valve regurgitation is  not visualized. No aortic stenosis is present.   7. The inferior vena cava is normal in size with greater than 50%  respiratory variability, suggesting right atrial pressure of 3 mmHg.   TEE 07/2021: IMPRESSIONS   1. Left ventricular ejection fraction, by estimation, is 50 to 55%. The  left ventricle has normal function. Left ventricular diastolic parameters  are indeterminate.   2. Right ventricular systolic function is normal. The right ventricular  size is normal.   3. Left atrial size was moderately dilated. No left atrial/left atrial  appendage thrombus was detected.   4. Right atrial size was moderately dilated.   5.  The pericardial effusion is surrounding the apex.   6. Post complex repair 2017. Artifical chords to posterior medial  papillary muscle , annuloplasty ring and Alfieri stitch. Dual orrifice  with mild/moderate MR and mild MS      MR emminates from medial orrifice . The mitral valve has been  repaired/replaced. Mild to moderate mitral valve regurgitation. Mild  mitral stenosis.   7. Mild prolapse . The tricuspid valve is myxomatous.   8. The aortic valve is tricuspid. Aortic valve regurgitation is not  visualized. No aortic stenosis is present.   Echo 07/07/21  1. Left ventricular ejection fraction  by 3D volume is 56 %. The left  ventricle has normal function. The left ventricle has no regional wall  motion abnormalities. Left ventricular diastolic parameters are consistent  with Grade II diastolic dysfunction (pseudonormalization). The average left ventricular global longitudinal strain is -16.6 %. The global longitudinal strain is abnormal.   2. Right ventricular systolic function is normal. The right ventricular  size is normal. There is normal pulmonary artery systolic pressure. The  estimated right ventricular systolic pressure is 53.6 mmHg.   3. The mitral valve has been repaired. Severe mitral valve regurgitation.  Moderate mitral stenosis. The mean mitral valve gradient is 6.0 mmHg.  There is a 38 Edwards Physio II ring annuloplasty present in the mitral  position. Procedure Date: 10/15/15.   4. The aortic valve is normal in structure. Aortic valve regurgitation is  not visualized. No aortic stenosis is present.   5. The inferior vena cava is normal in size with greater than 50%  respiratory variability, suggesting right atrial pressure of 3 mmHg.   Comparison(s): Prior images reviewed side by side. 06/23/20 EF 60-65%.  Moderate MR. MV 4mHg peak, 726mg mean PG.   Echo (OR) 10/15/15 - Left ventricle: The cavity size was dilated. Wall thickness was    normal. Systolic function was  normal. The estimated ejection    fraction was = 55%. Wall motion was normal; there were no    regional wall motion abnormalities.  - Mitral valve: Dilated annulus. Moderately thickened leaflets .    Severe myxomatous degeneration. No evidence of vegetation. There    was moderate regurgitation.  - Left atrium: No evidence of thrombus in the atrial cavity or    appendage.  - Atrial septum: No defect or patent foramen ovale was identified.   Doppler Pre-CABG 10/10/15 Findings consistent with 1-39 percent stenosis involving the right  internal carotid artery and the left internal carotid artery.  Prepared and Electronically Authenticated by   R/L Cath and Coronary Angio 08/18/15 1. Normal left and right heart filling pressures with prominent v-waves noted on PCWP pressure tracing.  2. No angiographic coronary artery disease.  3. 3+ mitral regurgitation noted.    Will refer for evaluation for minimally invasive mitral valve repair.  EKG:  EKG is personally reviewed. 04/16/22: Sinus rhythm. PVC. Rate 80 bpm. 10/12/2021: EKG was not ordered. 07/10/21: Sinus rhythm, rate 75 bpm; L atrium enlargement  Recent Labs: 12/25/2021: ALT 15; Hemoglobin 15.2; Platelet Count 165 01/01/2022: BUN 25; Creatinine, Ser 1.29; Potassium 4.4; Sodium 137   Recent Lipid Panel    Component Value Date/Time   CHOL 251 (H) 05/19/2020 1021   TRIG 83 05/19/2020 1021   HDL 100 05/19/2020 1021   CHOLHDL 2.5 05/19/2020 1021   VLDL 13.8 06/18/2014 0856   LDLCALC 133 (H) 05/19/2020 1021   LDLDIRECT 104.4 08/13/2010 0835    CHA2DS2-VASc Score =   _0 .  Therefore, the patient's annual risk of stroke is   %.        Physical Exam:    VS:  BP 118/62   Pulse 74   Ht _1  (1.753 m)   Wt 134 lb (60.8 kg)   SpO2 98%   BMI 19.79 kg/m     Wt Readings from Last 3 Encounters:  04/16/22 134 lb (60.8 kg)  12/25/21 130 lb (59 kg)  10/12/21 134 lb 3.2 oz (60.9 kg)     GEN:  Well nourished, well developed in no acute  distress HEENT: Normal NECK: No  JVD; No carotid bruits CARDIAC: RRR, 7-7/0 holosystolic murmur best heard at the LV apex, no rubs or gallops RESPIRATORY:  CTAB, no wheezes ABDOMEN: Soft, non-tender, non-distended MUSCULOSKELETAL:  No edema; No deformity  SKIN: Warm and dry NEUROLOGIC:  Alert and oriented x 3 PSYCHIATRIC:  Normal affect   ASSESSMENT:    1. Chronic systolic heart failure ([place])   2. Essential hypertension   3. S/P mitral valve repair   4. Moderate mitral stenosis   5. Moderate mitral regurgitation   6. Severe mitral regurgitation     PLAN:    In order of problems listed above:  #History of severe MR s/p MV repair with 39 Edwards Physio II annuloplasty ring in 2017: #Mild-to-mod MR, mild MS: Patient with history of myxomatous mitral valve disease with bileaflet prolapse and severe MR s/p minimally invasive mitral valve repair with annuloplasty ring and alfiere stitch in 2017 with Dr. Roxy Manns. TTE 07/07/21 showed EF 56%, severe MR, moderate MS. TEE 07/2021 showed only mild-to-moderate MR and mild MS. Recommended for continued medical management -Check CMR in 07/2022 to better assess degree of MR -Continue lisinopril 72m daily -Continue metop 12.538mdaily -Euvolemic on exam and not on diuretics  #History of NICM with Improved EF: Ef dropped to 45% post MVR but improved to 50-55% on most recent TTE. Clinically euvolemic. -Continue lisinopril 40m79maily -Continue metop 12.40mg640mily -Daily weights  #HTN: Very well controlled and at goal. -Continue lisinopril 40mg 39mly -Continue metop 12.40mg d49my  #HLD: Pre-op cath in 2017 without obstructive disease.  -Plans to check lipids with new PCP -Lifestyle modifications  #History of Multiple Myeloma c/b CKD: -Followed by Dr. SherriBenay Spiceollow-up:  5 months.  Medication Adjustments/Labs and Tests Ordered: Current medicines are reviewed at length with the patient today.  Concerns regarding medicines are outlined  above.   Orders Placed This Encounter  Procedures   MR CARDIAC MORPHOLOGY W WO CONTRAST   EKG 12-Lead   Meds ordered this encounter  Medications   lisinopril (ZESTRIL) 5 MG tablet    Sig: TAKE 1 TABLET BY MOUTH EVERY DAY IN THE MORNING    Dispense:  90 tablet    Refill:  3   metoprolol succinate (TOPROL-XL) 25 MG 24 hr tablet    Sig: Take 0.5 tablets (12.5 mg total) by mouth daily.    Dispense:  45 tablet    Refill:  3    I,Breanna Adamick,acting as a scribe for HeatheFreada Bergeronhave documented all relevant documentation on the behalf of HeatheFreada Bergerons directed by  HeatheFreada Bergeronhile in the presence of HeatheFreada Bergeron I, HeatheFreada Bergeronhave reviewed all documentation for this visit. The documentation on 04/16/22 for the exam, diagnosis, procedures, and orders are all accurate and complete.   Signed, HeatheFreada Bergeron11/08/2021 9:08 AM    [facility]

## 2022-04-19 ENCOUNTER — Ambulatory Visit: Payer: BC Managed Care – PPO | Admitting: Family Medicine

## 2022-04-19 ENCOUNTER — Encounter: Payer: Self-pay | Admitting: Family Medicine

## 2022-04-19 VITALS — BP 88/52 | HR 75 | Temp 97.8°F | Ht 69.0 in | Wt 136.0 lb

## 2022-04-19 DIAGNOSIS — Z1211 Encounter for screening for malignant neoplasm of colon: Secondary | ICD-10-CM

## 2022-04-19 DIAGNOSIS — Z13220 Encounter for screening for lipoid disorders: Secondary | ICD-10-CM

## 2022-04-19 DIAGNOSIS — I10 Essential (primary) hypertension: Secondary | ICD-10-CM

## 2022-04-19 NOTE — Progress Notes (Signed)
Established Patient Office Visit  Subjective   Patient ID: Cristina Garcia, female    DOB: Jul 07, 1960  Age: 61 y.o. MRN: [id number]  Chief Complaint  Patient presents with  . Establish Care    Patient is here for transition of care visit. She reports that she is doing well, she reports history of mulitple myeloma 15 years ago, is s/p stem cell transplant and has been in remission ever since. Pt reports that the cancer damaged her kidneys and her Cr has been elevated ever since.   Severe mitral regurg-- pt reports that she has had valve replacement in 2017. States that she is going to have a cardiac MRI in July 21 2022 to see if her valve needs replaced again. Has been getting [facility] yearly.  We reviewed her HM -- she is due for her colon cancer screening, states she would like the cologuard test at home.  Current Outpatient Medications  Medication Instructions  . acyclovir (ZOVIRAX) 200 MG capsule TAKE 3 CAPSULES BY MOUTH IN THE MORNING, 2 IN THE EVENING UNTIL WELL AND THEN TAKE 1 DAILY  . aspirin 81 mg, Oral, Daily  . cetirizine (ZYRTEC) 10 mg, Oral, Daily  . lisinopril (ZESTRIL) 5 MG tablet TAKE 1 TABLET BY MOUTH EVERY DAY IN THE MORNING  . Magnesium Oxide 250 mg, Oral, Daily  . metoprolol succinate (TOPROL-XL) 12.5 mg, Oral, Daily  . Multiple Vitamins-Minerals (MULTIVITAL PO) 1 tablet, Oral, Daily,       Patient Active Problem List   Diagnosis Date Noted  . S/P minimally invasive mitral valve repair 10/15/2015  . Chronic kidney disease (CKD)   . Atrial tachycardia ([place]) 12/12/2014  . Left foot pain 11/05/2014  . Mitral regurgitation 11/09/2013  . Chest wall pain 07/24/2013  . Reactive airway disease 07/24/2013  . Prolapse of mitral valve 06/21/2013  . Osteoporosis 12/24/2008  . Essential hypertension 11/06/2008  . MULTIPLE MYELOMA, IN REMISSION 03/13/2007  . Allergies 01/13/2007      Review of Systems  All other systems reviewed and are negative.     Objective:      BP (!) 88/52 (BP Location: Left Arm, Patient Position: Sitting, Cuff Size: Normal)   Pulse 75   Temp 97.8 F (36.6 C) (Oral)   Ht _0  (1.753 m)   Wt 136 lb (61.7 kg)   SpO2 99%   BMI 20.08 kg/m  {Vitals History (Optional):23777}  Physical Exam Vitals reviewed.  Constitutional:      Appearance: Normal appearance. She is well-groomed and normal weight.  Eyes:     Conjunctiva/sclera: Conjunctivae normal.  Neck:     Thyroid: No thyromegaly.  Cardiovascular:     Rate and Rhythm: Normal rate and regular rhythm.     Pulses: Normal pulses.     Heart sounds: S1 normal and S2 normal.  Pulmonary:     Effort: Pulmonary effort is normal.     Breath sounds: Normal breath sounds and air entry.  Abdominal:     General: Bowel sounds are normal.  Musculoskeletal:     Right lower leg: No edema.     Left lower leg: No edema.  Neurological:     Mental Status: She is alert and oriented to person, place, and time. Mental status is at baseline.     Gait: Gait is intact.  Psychiatric:        Mood and Affect: Mood and affect normal.        Speech: Speech normal.  Behavior: Behavior normal.        Judgment: Judgment normal.     No results found for any visits on 04/19/22.  Last metabolic panel Lab Results  Component Value Date   GLUCOSE 83 01/01/2022   NA 137 01/01/2022   K 4.4 01/01/2022   CL 101 01/01/2022   CO2 27 01/01/2022   BUN 25 (H) 01/01/2022   CREATININE 1.29 (H) 01/01/2022   GFRNONAA 47 (L) 01/01/2022   CALCIUM 10.0 01/01/2022   PROT 6.9 12/25/2021   ALBUMIN 4.7 12/25/2021   LABGLOB 2.2 11/24/2020   AGRATIO 2.0 (H) 11/24/2020   BILITOT 1.0 12/25/2021   ALKPHOS 51 12/25/2021   AST 21 12/25/2021   ALT 15 12/25/2021   ANIONGAP 9 01/01/2022   Last lipids Lab Results  Component Value Date   CHOL 251 (H) 05/19/2020   HDL 100 05/19/2020   LDLCALC 133 (H) 05/19/2020   LDLDIRECT 104.4 08/13/2010   TRIG 83 05/19/2020   CHOLHDL 2.5 05/19/2020      The  ASCVD Risk score (Arnett DK, et al., 2019) failed to calculate for the following reasons:   The valid systolic blood pressure range is 90 to 200 mmHg    Assessment & Plan:   Problem List Items Addressed This Visit   None Visit Diagnoses     Lipid screening    -  Primary   Relevant Orders   Lipid Panel   Colon cancer screening       Relevant Orders   Cologuard       No follow-ups on file.    Farrel Conners, MD

## 2022-04-21 NOTE — Assessment & Plan Note (Signed)
Current hypertension medications:       Sig   lisinopril (ZESTRIL) 5 MG tablet (Taking) TAKE 1 TABLET BY MOUTH EVERY DAY IN THE MORNING   metoprolol succinate (TOPROL-XL) 25 MG 24 hr tablet (Taking) Take 0.5 tablets (12.5 mg total) by mouth daily.      BP today is low, I advised that if she starts to develop any symptoms of low blood pressure that she should call her cardiologist to discuss her medications.

## 2022-05-11 DIAGNOSIS — Z1211 Encounter for screening for malignant neoplasm of colon: Secondary | ICD-10-CM | POA: Diagnosis not present

## 2022-05-19 LAB — COLOGUARD: COLOGUARD: NEGATIVE

## 2022-05-20 NOTE — Progress Notes (Signed)
Negative cologuard, follow up in 3 years

## 2022-06-29 ENCOUNTER — Inpatient Hospital Stay: Payer: BC Managed Care – PPO | Admitting: Oncology

## 2022-06-29 ENCOUNTER — Inpatient Hospital Stay: Payer: BC Managed Care – PPO | Attending: Oncology

## 2022-06-29 VITALS — BP 115/76 | HR 79 | Temp 98.1°F | Resp 18 | Ht 69.0 in | Wt 138.0 lb

## 2022-06-29 DIAGNOSIS — C9001 Multiple myeloma in remission: Secondary | ICD-10-CM | POA: Diagnosis not present

## 2022-06-29 DIAGNOSIS — N189 Chronic kidney disease, unspecified: Secondary | ICD-10-CM | POA: Insufficient documentation

## 2022-06-29 DIAGNOSIS — I341 Nonrheumatic mitral (valve) prolapse: Secondary | ICD-10-CM | POA: Diagnosis not present

## 2022-06-29 DIAGNOSIS — C9000 Multiple myeloma not having achieved remission: Secondary | ICD-10-CM | POA: Diagnosis not present

## 2022-06-29 DIAGNOSIS — I340 Nonrheumatic mitral (valve) insufficiency: Secondary | ICD-10-CM | POA: Diagnosis not present

## 2022-06-29 DIAGNOSIS — K625 Hemorrhage of anus and rectum: Secondary | ICD-10-CM | POA: Insufficient documentation

## 2022-06-29 DIAGNOSIS — M8580 Other specified disorders of bone density and structure, unspecified site: Secondary | ICD-10-CM | POA: Insufficient documentation

## 2022-06-29 LAB — BASIC METABOLIC PANEL - CANCER CENTER ONLY
Anion gap: 8 (ref 5–15)
BUN: 25 mg/dL — ABNORMAL HIGH (ref 8–23)
CO2: 29 mmol/L (ref 22–32)
Calcium: 9.9 mg/dL (ref 8.9–10.3)
Chloride: 102 mmol/L (ref 98–111)
Creatinine: 1.32 mg/dL — ABNORMAL HIGH (ref 0.44–1.00)
GFR, Estimated: 45 mL/min — ABNORMAL LOW (ref >60)
Glucose, Bld: 105 mg/dL — ABNORMAL HIGH (ref 70–99)
Potassium: 4.1 mmol/L (ref 3.5–5.1)
Sodium: 139 mmol/L (ref 135–145)

## 2022-06-29 LAB — CBC WITH DIFFERENTIAL (CANCER CENTER ONLY)
Abs Immature Granulocytes: 0.01 10*3/uL (ref 0.00–0.07)
Basophils Absolute: 0 10*3/uL (ref 0.0–0.1)
Basophils Relative: 0 %
Eosinophils Absolute: 0.1 10*3/uL (ref 0.0–0.5)
Eosinophils Relative: 1 %
HCT: 45.4 % (ref 36.0–46.0)
Hemoglobin: 15.3 g/dL — ABNORMAL HIGH (ref 12.0–15.0)
Immature Granulocytes: 0 %
Lymphocytes Relative: 31 %
Lymphs Abs: 1.5 10*3/uL (ref 0.7–4.0)
MCH: 32.4 pg (ref 26.0–34.0)
MCHC: 33.7 g/dL (ref 30.0–36.0)
MCV: 96.2 fL (ref 80.0–100.0)
Monocytes Absolute: 0.4 10*3/uL (ref 0.1–1.0)
Monocytes Relative: 9 %
Neutro Abs: 2.8 10*3/uL (ref 1.7–7.7)
Neutrophils Relative %: 59 %
Platelet Count: 163 10*3/uL (ref 150–400)
RBC: 4.72 MIL/uL (ref 3.87–5.11)
RDW: 12.5 % (ref 11.5–15.5)
WBC Count: 4.8 10*3/uL (ref 4.0–10.5)
nRBC: 0 % (ref 0.0–0.2)

## 2022-06-29 NOTE — Progress Notes (Signed)
  [place] OFFICE PROGRESS NOTE   Diagnosis: Multiple myeloma  INTERVAL HISTORY:   Cristina Garcia returns as scheduled.  She had a "cold "last month.  No other infection.  No new site of pain.  She is being evaluated by cardiology for mitral valve replacement.  Objective:  Vital signs in last 24 hours:  Blood pressure 115/76, pulse 79, temperature 98.1 F (36.7 C), temperature source Oral, resp. rate 18, height '5\' 9"'$  (1.753 m), weight 138 lb (62.6 kg), SpO2 100 %.    Lymphatics: No cervical or supraclavicular nodes Resp: Lungs clear bilaterally Cardio: Regular rate and rhythm GI: No hepatosplenomegaly Vascular: No leg edema   Lab Results:  Lab Results  Component Value Date   WBC 4.8 06/29/2022   HGB 15.3 (H) 06/29/2022   HCT 45.4 06/29/2022   MCV 96.2 06/29/2022   PLT 163 06/29/2022   NEUTROABS 2.8 06/29/2022    CMP  Lab Results  Component Value Date   NA 139 06/29/2022   K 4.1 06/29/2022   CL 102 06/29/2022   CO2 29 06/29/2022   GLUCOSE 105 (H) 06/29/2022   BUN 25 (H) 06/29/2022   CREATININE 1.32 (H) 06/29/2022   CALCIUM 9.9 06/29/2022   PROT 6.9 12/25/2021   ALBUMIN 4.7 12/25/2021   AST 21 12/25/2021   ALT 15 12/25/2021   ALKPHOS 51 12/25/2021   BILITOT 1.0 12/25/2021   GFRNONAA 45 (L) 06/29/2022   GFRAA 43 (L) 12/04/2019    Lab Results  Component Value Date   CEA 0.7 04/19/2006   CA125 28.1 11/24/2006     Medications: I have reviewed the patient's current medications.   Assessment/Plan: Multiple myeloma diagnosed in November 2007, IgA kappa, status post high-dose chemotherapy with autologous stem cell support at [facility] in June 2008. She remains in clinical remission.  history of Renal insufficiency secondary to multiple myeloma, now with chronic renal insufficiency History of hypokalemia. History of left pelvic pain secondary to a lytic left iliac lesion. A metastatic bone survey  04/20/2017 revealed stable lucencies at the left  sacral ala and medial left iliac, questionable new tiny lucency at the superior aspect of the left sacral ala Simple cyst at the lower pole of the right kidney on a renal ultrasound, 11/08/2006. Multiseptated cyst of the left adnexa on an ultrasound, 11/08/2006. Osteopenia Mitral valve prolapse/mitral regurgitation -minimally invasive mitral valve repair 10/15/2015 Report of intermittent rectal bleeding. Normal colonoscopy by Dr. Fuller Plan on 08/20/2011.      Disposition: Ms. Sanks appears stable.  She is in clinical remission from myeloma.  We will follow-up on the myeloma panel from today.  She will be referred for a metastatic bone survey.  Ms. Fenn will return for an office and lab visit in 6 months.  [name], MD  06/29/2022  2:52 PM

## 2022-06-30 LAB — IGG, IGA, IGM
IgA: 107 mg/dL (ref 87–352)
IgG (Immunoglobin G), Serum: 937 mg/dL (ref 586–1602)
IgM (Immunoglobulin M), Srm: 87 mg/dL (ref 26–217)

## 2022-06-30 LAB — KAPPA/LAMBDA LIGHT CHAINS
Kappa free light chain: 21.7 mg/L — ABNORMAL HIGH (ref 3.3–19.4)
Kappa, lambda light chain ratio: 1.41 (ref 0.26–1.65)
Lambda free light chains: 15.4 mg/L (ref 5.7–26.3)

## 2022-07-01 LAB — PROTEIN ELECTROPHORESIS, SERUM
A/G Ratio: 1.7 (ref 0.7–1.7)
Albumin ELP: 4.3 g/dL (ref 2.9–4.4)
Alpha-1-Globulin: 0.2 g/dL (ref 0.0–0.4)
Alpha-2-Globulin: 0.6 g/dL (ref 0.4–1.0)
Beta Globulin: 0.8 g/dL (ref 0.7–1.3)
Gamma Globulin: 0.9 g/dL (ref 0.4–1.8)
Globulin, Total: 2.5 g/dL (ref 2.2–3.9)
Total Protein ELP: 6.8 g/dL (ref 6.0–8.5)

## 2022-07-20 ENCOUNTER — Telehealth (HOSPITAL_COMMUNITY): Payer: Self-pay | Admitting: *Deleted

## 2022-07-20 NOTE — Telephone Encounter (Signed)
Reaching out to patient to offer assistance regarding upcoming cardiac imaging study; pt verbalizes understanding of appt date/time, parking situation and where to check in, and verified current allergies; name and call back number provided for further questions should they arise  Cristina Clement RN Navigator Cardiac Imaging [facility] [phone] office [phone] cell  Patient denies claustrophobia but does report pin in her foot.

## 2022-07-21 ENCOUNTER — Other Ambulatory Visit: Payer: Self-pay | Admitting: Cardiology

## 2022-07-21 ENCOUNTER — Ambulatory Visit (HOSPITAL_COMMUNITY)
Admission: RE | Admit: 2022-07-21 | Discharge: 2022-07-21 | Disposition: A | Discharge status: Home / Self Care | Payer: BC Managed Care – PPO | Source: Ambulatory Visit | Attending: Cardiology | Admitting: Cardiology

## 2022-07-21 DIAGNOSIS — I10 Essential (primary) hypertension: Secondary | ICD-10-CM

## 2022-07-21 DIAGNOSIS — I050 Rheumatic mitral stenosis: Secondary | ICD-10-CM

## 2022-07-21 DIAGNOSIS — I340 Nonrheumatic mitral (valve) insufficiency: Secondary | ICD-10-CM | POA: Diagnosis not present

## 2022-07-21 DIAGNOSIS — Z9889 Other specified postprocedural states: Secondary | ICD-10-CM | POA: Diagnosis not present

## 2022-07-21 MED ORDER — GADOBUTROL 1 MMOL/ML IV SOLN
9.0000 mL | Freq: Once | INTRAVENOUS | Status: AC | PRN
  Administered 2022-07-21: 9 mL via INTRAVENOUS

## 2022-07-30 ENCOUNTER — Encounter: Payer: Self-pay | Admitting: Family Medicine

## 2022-07-30 ENCOUNTER — Ambulatory Visit (HOSPITAL_BASED_OUTPATIENT_CLINIC_OR_DEPARTMENT_OTHER)
Admission: RE | Admit: 2022-07-30 | Discharge: 2022-07-30 | Disposition: A | Discharge status: Home / Self Care | Payer: BC Managed Care – PPO | Source: Ambulatory Visit | Attending: Oncology | Admitting: Oncology

## 2022-07-30 DIAGNOSIS — C9001 Multiple myeloma in remission: Secondary | ICD-10-CM | POA: Insufficient documentation

## 2022-07-30 DIAGNOSIS — M899 Disorder of bone, unspecified: Secondary | ICD-10-CM | POA: Diagnosis not present

## 2022-08-18 NOTE — Progress Notes (Signed)
Cardiology Office Note:    Date:  08/20/2022   ID:  ZELPHIA KOLLIAS, DOB [birth year], MRN [id number]  PCP:  Farrel Conners, MD  Cardiologist:  Freada Bergeron, MD    Referring MD: No ref. provider found   No chief complaint on file.   History of Present Illness:    Cristina Garcia is a 62 y.o. female with a hx of of mitral valve prolapse, mitral regurgitation, hypertension, and CKD presents for follow-up previously seen by Dr. Meda Coffee.    Per review of record, patient had a long history of murmur and was told for years that she has mitral valve prolapse.  Echo in 1/15 showed bileaflet prolapse and probably moderate MR.  TEE was done in 3/15 and showed moderate to severe MR from bileaflet mitral valve prolapse with normal LV size and no pulmonary vein systolic doppler flow reversal.  She had repeat echo in 6/16.  It continued to show moderate to severe mid to late systolic mitral regurgitation in the setting of MVP.  EF 60%, mild LV dilation, moderate LAE.  Echo in 2/17 showed severe MR due to MVP. Last winter, she developed exertional dyspnea. TEE confirmed severe MR from bileaflet prolapse. LHC showed no significant CAD.  In 5/17, she had a complex minimally invasive mitral valve repair by Dr Roxy Manns.  She had Alfieri edge-to-edge repair and annuloplasty.  Post-operatively, she required a right thoracentesis. Echo in 6/17 (post-op) showed EF 45%, mild LV dilation, stable repaired mitral valve with no MR or MS.  PASP 48 mmHg.   She saw Ermalinda Barrios via virtual visit on 08/04/2020 where she was dealing with a lot of stress at work and had mild palpitations when laying down at night but no HF symptoms. She ordered a yearly TTE for monitoring which was performed on 07/07/21 and revealed severe MR, moderate MS with mean gradient 6, EF 56%, G2DD, GLS -16.6 (mildly reduced).   Was seen in follow-up in 06/2021 given TTE findings as detailed above where she was feeling off and suffering from more  fatigue. TEE 07/2021 with LVEF 50-55%, moderate LAE, moderate RAE, s/p mitral annuloplasty with alfiere stitch with mild/mod MR, mild MS and mild TR.  Seen in clinic on 10/2021 where she was overall stable. Continued to have intermittent palpitations that were stable. She declined cardiac monitor at that time. Repeat TTE 01/25/22 showed mild to moderate MR and moderate MS with mean gradient 7.67mHg. EF 50-55%.  Was last seen in clinic 04/2022 where she was doing very well. Tolerating medications without issue.   CMR 07/2022 with EF 52%, mild MR, mild to moderate TR.  Today, the patient overall well. Continues to have intermittent fatigue but no significant lightheadedness, dizziness or syncope. Has occasional palpitations when laying down at night, but not overly bothersome. No chest pain, orthopnea, PND, LE edema or SOB. Remains active and feels okay with activity.   Past Medical History:  Diagnosis Date   Allergy    Anxiety    Atrial tachycardia 12/18/2014   24 hour event monitor with frequent PAC's, PVC's and short runs of atrial tachycardia - no sustained arrhythmias or atrial fibrillation    Chronic kidney disease (CKD)    Constipation    H/O multiple myeloma    clinical remission   Hypertension    Mitral regurgitation    Mitral valve prolapse    Osteoporosis    Pleural effusion, right 11/03/2015   Pleural effusion, right 11/03/2015   S/P minimally  invasive mitral valve repair 10/15/2015   Complex valvuloplasty including Alfieri edge-to-edge repair, generous quadrangular resection (shortening) of posterior leaflet, sliding leaflet plasty, Gore-tex neochord placement x6, and 38 mm Edwards Physio II ring annuloplasty via right mini thoracotomy approach     Past Surgical History:  Procedure Laterality Date   BREAST ENHANCEMENT SURGERY  2000   CARDIAC CATHETERIZATION N/A 08/18/2015   Procedure: Right/Left Heart Cath and Coronary Angiography;  Surgeon: Larey Dresser, MD;  Location: [facility];  Service: Cardiovascular;  Laterality: N/A;   CESAREAN SECTION  1992   HAMMERTOE RECONSTRUCTION WITH WEIL OSTEOTOMY Left 09/09/2016   Procedure: Left 2-4 MT Weil osteotomies; Left 2nd hammertoe correction and extensor tendon lengthing;  Surgeon: Wylene Simmer, MD;  Location: [facility];  Service: Orthopedics;  Laterality: Left;   LIMBAL STEM CELL TRANSPLANT  2008   MITRAL VALVE REPAIR Right 10/15/2015   Procedure: MINIMALLY INVASIVE MITRAL VALVE REPAIR (MVR);  Surgeon: Rexene Alberts, MD;  Location: [facility];  Service: Open Heart Surgery;  Laterality: Right;   OOPHORECTOMY  1991   left; got cyst on ovary with torsion when pregnant.   TEE WITHOUT CARDIOVERSION N/A 09/05/2013   Procedure: TRANSESOPHAGEAL ECHOCARDIOGRAM (TEE);  Surgeon: Larey Dresser, MD;  Location: [facility];  Service: Cardiovascular;  Laterality: N/A;   TEE WITHOUT CARDIOVERSION N/A 08/18/2015   Procedure: TRANSESOPHAGEAL ECHOCARDIOGRAM (TEE);  Surgeon: Larey Dresser, MD;  Location: [facility];  Service: Cardiovascular;  Laterality: N/A;   TEE WITHOUT CARDIOVERSION N/A 10/15/2015   Procedure: TRANSESOPHAGEAL ECHOCARDIOGRAM (TEE);  Surgeon: Rexene Alberts, MD;  Location: [facility];  Service: Open Heart Surgery;  Laterality: N/A;   TEE WITHOUT CARDIOVERSION N/A 07/29/2021   Procedure: TRANSESOPHAGEAL ECHOCARDIOGRAM (TEE);  Surgeon: Josue Hector, MD;  Location: [facility];  Service: Cardiovascular;  Laterality: N/A;   WISDOM TOOTH EXTRACTION  1975    Current Medications: Current Meds  Medication Sig   acyclovir (ZOVIRAX) 200 MG capsule TAKE 3 CAPSULES BY MOUTH IN THE MORNING, 2 IN THE EVENING UNTIL WELL AND THEN TAKE 1 DAILY   aspirin 81 MG tablet Take 81 mg by mouth daily.   cetirizine (ZYRTEC) 10 MG tablet Take 10 mg by mouth daily.   lisinopril (ZESTRIL) 5 MG tablet TAKE 1 TABLET BY MOUTH EVERY DAY IN THE MORNING   Magnesium Oxide 250 MG TABS Take 250 mg by mouth daily.   metoprolol succinate  (TOPROL-XL) 25 MG 24 hr tablet Take 0.5 tablets (12.5 mg total) by mouth daily.   Multiple Vitamins-Minerals (MULTIVITAL PO) Take 1 tablet by mouth daily.     Allergies:   Patient has no known allergies.   Social History   Socioeconomic History   Marital status: Married    Spouse name: Not on file   Number of children: Not on file   Years of education: Not on file   Highest education level: Not on file  Occupational History   Not on file  Tobacco Use   Smoking status: Never   Smokeless tobacco: Never  Vaping Use   Vaping Use: Not on file  Substance and Sexual Activity   Alcohol use: Yes    Alcohol/week: 2.0 standard drinks of alcohol    Types: 2 Glasses of wine per week    Comment: socially   Drug use: No   Sexual activity: Not on file  Other Topics Concern   Not on file  Social History Narrative   Not on file  Social Determinants of Health   Financial Resource Strain: Not on file  Food Insecurity: Not on file  Transportation Needs: Not on file  Physical Activity: Not on file  Stress: Not on file  Social Connections: Not on file     Family History: The patient's family history includes Congestive Heart Failure in her mother; Diabetes in her mother and another family member; Hypertension in her father and another family member; Lymphoma in an other family member; Non-Hodgkin's lymphoma in her mother; Other in her maternal grandmother; Renal cancer in her paternal grandmother; Rheumatic fever in her mother. There is no history of Colon cancer or Stomach cancer.  ROS:   Please see the history of present illness.    Review of Systems  Constitutional:  Negative for malaise/fatigue and weight loss.  HENT:  Negative for congestion.   Eyes:  Negative for double vision.  Respiratory:  Negative for cough and shortness of breath.   Cardiovascular:  Positive for palpitations. Negative for chest pain, orthopnea, claudication, leg swelling and PND.  Gastrointestinal:   Negative for heartburn and nausea.  Musculoskeletal:  Positive for joint pain (L foot). Negative for neck pain.  Skin:  Negative for itching and rash.  Neurological:  Negative for dizziness and headaches.  Endo/Heme/Allergies:  Does not bruise/bleed easily.  Psychiatric/Behavioral:  Negative for depression. The patient has insomnia.     All other systems reviewed and negative.   EKGs/Labs/Other Studies Reviewed:    The following studies were reviewed today: CMR 08-11-22: FINDINGS: Left ventricle:   -Normal size   -Low normal systolic function   -Elevated ECV (31%)   -No LGE   LV EF:  52% (Normal 52-79%)   Absolute volumes:   LV EDV: 196m (Normal 78-167 mL)   LV ESV: 528m(Normal 21-64 mL)   LV SV: 6519mNormal 52-114 mL)   CO: 4.5L/min (Normal 2.7-6.3 L/min)   Indexed volumes:   LV EDV: 70m36m-m (Normal 50-96 mL/sq-m)   LV ESV: 34mL47mm (Normal 10-40 mL/sq-m)   LV SV: 37mL/49m (Normal 33-64 mL/sq-m)   CI: 2.6L/min/sq-m (Normal 1.9-3.9 L/min/sq-m)   Right ventricle: Normal size and systolic function   RV EF: 54% (Normal 52-80%)   Absolute volumes:   RV EDV: 113mL (51mal 79-175 mL)   RV ESV: 53mL (N3ml 13-75 mL)   RV SV: 61mL (No12m 56-110 mL)   CO: 4.2L/min (Normal 2.7-6 L/min)   Indexed volumes:   RV EDV: 65mL/sq-m66mrmal 51-97 mL/sq-m)   RV ESV: 30mL/sq-m 40mmal 9-42 mL/sq-m)   RV SV: 35mL/sq-m (35mal 35-61 mL/sq-m)   CI: 2.4L/min/sq-m (Normal 1.8-3.8 L/min/sq-m)   Left atrium: Mild enlargement   Right atrium: Normal size   Mitral valve: S/p MV repair. Mild regurgitation (regurgitant fraction 17%)   Aortic valve: No regurgitation   Tricuspid valve: Mild to moderate regurgitation (regurgitant fraction 20%)   Pulmonic valve: No regurgitation   Aorta: Normal proximal ascending aorta   Pericardium: Normal   IMPRESSION: 1.  Normal LV size with low normal systolic function (EF 52%)   2.  [id number] RV size and systolic  function (EF 54%)   3.  [id number] gadolinium enhancement to suggest myocardial scar   4. S/p MV repair. Mild mitral regurgitation (regurgitant fraction 17%)   5. Mild to moderate tricuspid regurgitation (regurgitant fraction 20%)  TTE 01/25/22: IMPRESSIONS    1. Left ventricular ejection fraction, by estimation, is 50 to 55%. The  left ventricle has low normal function. The left ventricle demonstrates  global hypokinesis. Left  ventricular diastolic function could not be  evaluated.   2. Right ventricular systolic function is normal. The right ventricular  size is normal.   3. Left atrial size was mildly dilated.   4. The mitral valve has been repaired/replaced. Mild to moderate mitral  valve regurgitation. Moderate functional mitral stenosis. The mean mitral  valve gradient is 7.5 mmHg. There is a 20 Edwards present in the mitral  position. Procedure Date: 10/15/2015.   5. Tricuspid valve regurgitation is mild to moderate.   6. The aortic valve is normal in structure. Aortic valve regurgitation is  not visualized. No aortic stenosis is present.   7. The inferior vena cava is normal in size with greater than 50%  respiratory variability, suggesting right atrial pressure of 3 mmHg.   TEE 07/2021: IMPRESSIONS   1. Left ventricular ejection fraction, by estimation, is 50 to 55%. The  left ventricle has normal function. Left ventricular diastolic parameters  are indeterminate.   2. Right ventricular systolic function is normal. The right ventricular  size is normal.   3. Left atrial size was moderately dilated. No left atrial/left atrial  appendage thrombus was detected.   4. Right atrial size was moderately dilated.   5. The pericardial effusion is surrounding the apex.   6. Post complex repair 2017. Artifical chords to posterior medial  papillary muscle , annuloplasty ring and Alfieri stitch. Dual orrifice  with mild/moderate MR and mild MS      MR emminates from medial orrifice  . The mitral valve has been  repaired/replaced. Mild to moderate mitral valve regurgitation. Mild  mitral stenosis.   7. Mild prolapse . The tricuspid valve is myxomatous.   8. The aortic valve is tricuspid. Aortic valve regurgitation is not  visualized. No aortic stenosis is present.   Echo 07/07/21  1. Left ventricular ejection fraction by 3D volume is 56 %. The left  ventricle has normal function. The left ventricle has no regional wall  motion abnormalities. Left ventricular diastolic parameters are consistent  with Grade II diastolic dysfunction (pseudonormalization). The average left ventricular global longitudinal strain is -16.6 %. The global longitudinal strain is abnormal.   2. Right ventricular systolic function is normal. The right ventricular  size is normal. There is normal pulmonary artery systolic pressure. The  estimated right ventricular systolic pressure is [id number] mmHg.   3. The mitral valve has been repaired. Severe mitral valve regurgitation.  Moderate mitral stenosis. The mean mitral valve gradient is 6.0 mmHg.  There is a 38 Edwards Physio II ring annuloplasty present in the mitral  position. Procedure Date: 10/15/15.   4. The aortic valve is normal in structure. Aortic valve regurgitation is  not visualized. No aortic stenosis is present.   5. The inferior vena cava is normal in size with greater than 50%  respiratory variability, suggesting right atrial pressure of 3 mmHg.   Comparison(s): Prior images reviewed side by side. 06/23/20 EF 60-65%.  Moderate MR. MV 63mHg peak, 745mg mean PG.   Echo (OR) 10/15/15 - Left ventricle: The cavity size was dilated. Wall thickness was    normal. Systolic function was normal. The estimated ejection    fraction was = 55%. Wall motion was normal; there were no    regional wall motion abnormalities.  - Mitral valve: Dilated annulus. Moderately thickened leaflets .    Severe myxomatous degeneration. No evidence of vegetation. There     was moderate regurgitation.  - Left atrium: No evidence of thrombus in  the atrial cavity or    appendage.  - Atrial septum: No defect or patent foramen ovale was identified.   Doppler Pre-CABG 10/10/15 Findings consistent with 1-39 percent stenosis involving the right  internal carotid artery and the left internal carotid artery.  Prepared and Electronically Authenticated by   R/L Cath and Coronary Angio 08/18/15 1. Normal left and right heart filling pressures with prominent v-waves noted on PCWP pressure tracing.  2. No angiographic coronary artery disease.  3. 3+ mitral regurgitation noted.    Will refer for evaluation for minimally invasive mitral valve repair.  EKG:  EKG not performed today  Recent Labs: 12/25/2021: ALT 15 06/29/2022: BUN 25; Creatinine 1.32; Hemoglobin 15.3; Platelet Count 163; Potassium 4.1; Sodium 139   Recent Lipid Panel    Component Value Date/Time   CHOL 251 (H) 05/19/2020 1021   TRIG 83 05/19/2020 1021   HDL 100 05/19/2020 1021   CHOLHDL 2.5 05/19/2020 1021   VLDL 13.8 06/18/2014 0856   LDLCALC 133 (H) 05/19/2020 1021   LDLDIRECT 104.4 08/13/2010 0835    CHA2DS2-VASc Score =   '[ ]'$ .  Therefore, the patient's annual risk of stroke is   %.        Physical Exam:    VS:  BP 106/64   Pulse 62   Ht '5\' 9"'$  (1.753 m)   Wt 136 lb 12.8 oz (62.1 kg)   SpO2 97%   BMI 20.20 kg/m     Wt Readings from Last 3 Encounters:  08/20/22 136 lb 12.8 oz (62.1 kg)  06/29/22 138 lb (62.6 kg)  04/19/22 136 lb (61.7 kg)     GEN:  Well nourished, well developed in no acute distress HEENT: Normal NECK: No JVD; No carotid bruits CARDIAC: RRR, 1/6 systolic murmur best heard at the apex RESPIRATORY:  Clear bilaterally ABDOMEN: Soft, non-tender, non-distended MUSCULOSKELETAL:  No edema; No deformity  SKIN: Warm and dry NEUROLOGIC:  Alert and oriented x 3 PSYCHIATRIC:  Normal affect   ASSESSMENT:    1. Severe mitral regurgitation   2. Chronic systolic heart  failure ([place])   3. S/P mitral valve repair   4. Essential hypertension   5. Hyperlipidemia, unspecified hyperlipidemia type     PLAN:    In order of problems listed above:  #History of severe MR s/p MV repair with 60 Edwards Physio II annuloplasty ring in 2017: #Mild-to-mod MR, mild MS: Patient with history of myxomatous mitral valve disease with bileaflet prolapse and severe MR s/p minimally invasive mitral valve repair with annuloplasty ring and alfiere stitch in 2017 with Dr. Roxy Manns. TTE 07/07/21 showed EF 56%, ? severe MR, moderate MS. TEE 07/2021 showed only mild-to-moderate MR and mild MS. Follow-up CMR 07/2022 with mild MR. Will continue with medical management. -Continue lisinopril '5mg'$  daily -Continue metop 12.'5mg'$  daily -Euvolemic on exam and not on diuretics -Check TTE for monitoring in 07/2022  #History of NICM with Improved EF: Ef dropped to 45% post MVR but improved to 50-55% on most recent TTE and 52% on CMR. Currently euvolemic on exam. -Continue lisinopril '5mg'$  daily -Continue metop 12.'5mg'$  daily -Daily weights  #HTN: Very well controlled and at goal. -Continue lisinopril '5mg'$  daily -Continue metop 12.'5mg'$  daily  #HLD: Pre-op cath in 2017 without obstructive disease.  -Lifestyle modifications -Lipids per PCP  #History of Multiple Myeloma c/b CKD: -Followed by Dr. Benay Spice     Follow-up:  5 months.  Medication Adjustments/Labs and Tests Ordered: Current medicines are reviewed at length with the patient  today.  Concerns regarding medicines are outlined above.   Orders Placed This Encounter  Procedures   ECHOCARDIOGRAM COMPLETE   No orders of the defined types were placed in this encounter.   Signed, Freada Bergeron, MD  08/20/2022 8:56 AM    [place]

## 2022-08-20 ENCOUNTER — Ambulatory Visit: Payer: BC Managed Care – PPO | Attending: Cardiology | Admitting: Cardiology

## 2022-08-20 ENCOUNTER — Encounter: Payer: Self-pay | Admitting: Cardiology

## 2022-08-20 VITALS — BP 106/64 | HR 62 | Ht 69.0 in | Wt 136.8 lb

## 2022-08-20 DIAGNOSIS — I10 Essential (primary) hypertension: Secondary | ICD-10-CM

## 2022-08-20 DIAGNOSIS — Z9889 Other specified postprocedural states: Secondary | ICD-10-CM

## 2022-08-20 DIAGNOSIS — I5022 Chronic systolic (congestive) heart failure: Secondary | ICD-10-CM

## 2022-08-20 DIAGNOSIS — E785 Hyperlipidemia, unspecified: Secondary | ICD-10-CM

## 2022-08-20 DIAGNOSIS — I340 Nonrheumatic mitral (valve) insufficiency: Secondary | ICD-10-CM

## 2022-08-20 NOTE — Patient Instructions (Signed)
Medication Instructions:   Your physician recommends that you continue on your current medications as directed. Please refer to the Current Medication list given to you today.  *If you need a refill on your cardiac medications before your next appointment, please call your pharmacy*    Testing/Procedures:  Your physician has requested that you have an echocardiogram. Echocardiography is a painless test that uses sound waves to create images of your heart. It provides your doctor with information about the size and shape of your heart and how well your heart's chambers and valves are working. This procedure takes approximately one hour. There are no restrictions for this procedure. PATIENT IS CURRENTLY SCHEDULED TO HAVE AN ECHO DONE IN AUGUST 2024 BUT DR. PEMBERTON SAID YOU CAN CANCEL THAT ONE AND PUSH HER ECHO OUT TO FEB 2025 AND SCHEDULE IT THEN   Please do NOT wear cologne, perfume, aftershave, or lotions (deodorant is allowed). Please arrive 15 minutes prior to your appointment time.     Follow-Up: At [facility], you and your health needs are our priority.  As part of our continuing mission to provide you with exceptional heart care, we have created designated Provider Care Teams.  These Care Teams include your primary Cardiologist (physician) and Advanced Practice Providers (APPs -  Physician Assistants and Nurse Practitioners) who all work together to provide you with the care you need, when you need it.  We recommend signing up for the patient portal called "MyChart".  Sign up information is provided on this After Visit Summary.  MyChart is used to connect with patients for Virtual Visits (Telemedicine).  Patients are able to view lab/test results, encounter notes, upcoming appointments, etc.  Non-urgent messages can be sent to your provider as well.   To learn more about what you can do with MyChart, go to [email or web address].    Your next appointment:   6  month(s)  Provider:   Freada Bergeron, MD

## 2022-10-25 ENCOUNTER — Encounter: Payer: Self-pay | Admitting: Urgent Care

## 2022-10-25 ENCOUNTER — Ambulatory Visit: Payer: BC Managed Care – PPO | Admitting: Urgent Care

## 2022-10-25 VITALS — BP 98/64 | HR 66 | Temp 97.8°F | Ht 69.0 in | Wt 134.0 lb

## 2022-10-25 DIAGNOSIS — J4521 Mild intermittent asthma with (acute) exacerbation: Secondary | ICD-10-CM | POA: Diagnosis not present

## 2022-10-25 MED ORDER — AYR SALINE NASAL NA GEL: 1.0000 | Freq: Four times a day (QID) | NASAL | 0 refills | Status: DC | PRN

## 2022-10-25 MED ORDER — MONTELUKAST SODIUM 10 MG PO TABS: 10.0000 mg | ORAL_TABLET | Freq: Every day | ORAL | 0 refills | Status: DC

## 2022-10-25 MED ORDER — HYDROCOD POLI-CHLORPHE POLI ER 10-8 MG/5ML PO SUER: 5.0000 mL | Freq: Every evening | ORAL | 0 refills | Status: DC | PRN

## 2022-10-25 MED ORDER — PREDNISONE 20 MG PO TABS: 20.0000 mg | ORAL_TABLET | Freq: Every day | ORAL | 0 refills | Status: DC

## 2022-10-25 NOTE — Patient Instructions (Signed)
Please take the prednisone once daily with breakfast in the morning. Take one tab of Singulair every night before bed until symptoms resolve.  You may continue Mucinex 600mg  twice daily with plenty of water.  Please avoid nasal sprays due to the dryness noted. I have called in AYR nasal saline gel. Please use this up to every 6 hours as needed to prevent nose bleeds.  Use the cough medication only at night to help you sleep.

## 2022-10-25 NOTE — Progress Notes (Signed)
Acute Office Visit  Subjective:     Patient ID: Cristina Garcia, female    DOB: 1961/05/15, 62 y.o.   MRN: [id number]  Chief Complaint  Patient presents with   Cough   Nasal Congestion    Cough   Patient is in today for cough.   Pleasant 61yo female presents today due to concerns of cold-like symptoms. She states it started over a week ago as a head cold. Her boss was sick with similar sx and believes she may have caught it from her. Started with a sore throat that has actually resolved. It then turned into nasal congestion , rhinorrhea and post nasal drainage. She tried afrin a few times with minimal relief. She started having a cough productive of green phlegm on Monday. No fevers, did have some body aches which have resolved. States it has now moved into her chest and she is coughing up thick chunks of green. Had a few rough nights of sleep due to the cough. Had been taking mucinex, tylenol and delsym without relief. Denies wheezing, tightness in chest, SOB, CP, palps.   Review of Systems  Respiratory:  Positive for cough.    As per HPI     Objective:    BP 98/64 (BP Location: Right Arm, Patient Position: Sitting, Cuff Size: Normal)   Pulse 66   Temp 97.8 F (36.6 C) (Oral)   Ht 5\' 9"  (1.753 m)   Wt 134 lb (60.8 kg)   SpO2 98%   BMI 19.79 kg/m   Recheck BP by provider: 110/60  Physical Exam Vitals and nursing note reviewed.  Constitutional:      General: She is not in acute distress.    Appearance: She is well-developed. She is not ill-appearing or toxic-appearing.  HENT:     Head: Normocephalic and atraumatic.     Right Ear: Tympanic membrane, ear canal and external ear normal. There is no impacted cerumen.     Left Ear: Tympanic membrane, ear canal and external ear normal. There is no impacted cerumen.     Nose: Nose normal. No congestion or rhinorrhea.     Comments: Very dry nares with visible blood on L nasal septum    Mouth/Throat:     Mouth: Mucous  membranes are moist.     Pharynx: Oropharynx is clear. No oropharyngeal exudate or posterior oropharyngeal erythema.  Eyes:     General: No scleral icterus.       Right eye: No discharge.        Left eye: No discharge.     Extraocular Movements: Extraocular movements intact.     Conjunctiva/sclera: Conjunctivae normal.     Pupils: Pupils are equal, round, and reactive to light.  Cardiovascular:     Rate and Rhythm: Normal rate and regular rhythm.     Heart sounds: Murmur heard.  Pulmonary:     Effort: Pulmonary effort is normal. No accessory muscle usage, respiratory distress or retractions.     Breath sounds: Normal breath sounds and air entry. No stridor, decreased air movement or transmitted upper airway sounds. No decreased breath sounds, wheezing, rhonchi or rales.  Abdominal:     Palpations: Abdomen is soft.     Tenderness: There is no abdominal tenderness.  Musculoskeletal:        General: No swelling.     Cervical back: Normal range of motion and neck supple. No rigidity or tenderness.  Lymphadenopathy:     Cervical: No cervical adenopathy.  Skin:  General: Skin is warm and dry.     Capillary Refill: Capillary refill takes less than 2 seconds.  Neurological:     Mental Status: She is alert.  Psychiatric:        Mood and Affect: Mood normal.     No results found for any visits on 10/25/22.      Assessment & Plan:   Problem List Items Addressed This Visit       Respiratory   Reactive airway disease - Primary    Viral RAD vs allergic. Will do combo with prednisone in am and singulair in PM. Pt to continue her home medications. Only take cough syrup at night to help sleep, encouraged continued use of plain mucinex (guaifenesin only) twice daily to help break up sputum. Does not sound bacterial at this time; if sx persist >1 additional week, consider adding abx.   Use AYR nasal gel to L nare to help prevent bleeding and humidify nasal passages. Do not use afrin.        Meds ordered this encounter  Medications   predniSONE (DELTASONE) 20 MG tablet    Sig: Take 1 tablet (20 mg total) by mouth daily with breakfast.    Dispense:  5 tablet    Refill:  0   montelukast (SINGULAIR) 10 MG tablet    Sig: Take 1 tablet (10 mg total) by mouth at bedtime.    Dispense:  14 tablet    Refill:  0   saline (AYR) GEL    Sig: Place 1 Application into both nostrils every 6 (six) hours as needed.    Dispense:  14.1 g    Refill:  0   chlorpheniramine-HYDROcodone (TUSSIONEX) 10-8 MG/5ML    Sig: Take 5 mLs by mouth at bedtime as needed for cough.    Dispense:  30 mL    Refill:  0    No follow-ups on file.  Nettie Cromwell L Nobuo Nunziata, PA

## 2022-10-25 NOTE — Assessment & Plan Note (Signed)
Viral RAD vs allergic. Will do combo with prednisone in am and singulair in PM. Pt to continue her home medications. Only take cough syrup at night to help sleep, encouraged continued use of plain mucinex (guaifenesin only) twice daily to help break up sputum. Does not sound bacterial at this time; if sx persist >1 additional week, consider adding abx.   Use AYR nasal gel to L nare to help prevent bleeding and humidify nasal passages. Do not use afrin.

## 2022-12-13 ENCOUNTER — Telehealth: Payer: Self-pay | Admitting: Oncology

## 2022-12-13 NOTE — Telephone Encounter (Signed)
Spoke with patient confirming upcoming appointment  

## 2022-12-23 ENCOUNTER — Inpatient Hospital Stay: Payer: BC Managed Care – PPO

## 2022-12-23 ENCOUNTER — Inpatient Hospital Stay: Payer: BC Managed Care – PPO | Admitting: Oncology

## 2022-12-25 ENCOUNTER — Encounter: Payer: Self-pay | Admitting: Cardiology

## 2023-01-10 ENCOUNTER — Inpatient Hospital Stay: Payer: BC Managed Care – PPO

## 2023-01-10 ENCOUNTER — Inpatient Hospital Stay: Payer: BC Managed Care – PPO | Admitting: Oncology

## 2023-01-10 VITALS — BP 123/78 | HR 67 | Temp 98.1°F | Resp 18 | Ht 69.0 in | Wt 133.3 lb

## 2023-01-10 DIAGNOSIS — M8580 Other specified disorders of bone density and structure, unspecified site: Secondary | ICD-10-CM | POA: Insufficient documentation

## 2023-01-10 DIAGNOSIS — C9000 Multiple myeloma not having achieved remission: Secondary | ICD-10-CM | POA: Diagnosis not present

## 2023-01-10 DIAGNOSIS — C9001 Multiple myeloma in remission: Secondary | ICD-10-CM

## 2023-01-10 DIAGNOSIS — N189 Chronic kidney disease, unspecified: Secondary | ICD-10-CM | POA: Insufficient documentation

## 2023-01-10 LAB — CBC WITH DIFFERENTIAL (CANCER CENTER ONLY)
Abs Immature Granulocytes: 0.01 10*3/uL (ref 0.00–0.07)
Basophils Absolute: 0 10*3/uL (ref 0.0–0.1)
Basophils Relative: 1 %
Eosinophils Absolute: 0.1 10*3/uL (ref 0.0–0.5)
Eosinophils Relative: 1 %
HCT: 45.5 % (ref 36.0–46.0)
Hemoglobin: 15.4 g/dL — ABNORMAL HIGH (ref 12.0–15.0)
Immature Granulocytes: 0 %
Lymphocytes Relative: 21 %
Lymphs Abs: 1.3 10*3/uL (ref 0.7–4.0)
MCH: 32.3 pg (ref 26.0–34.0)
MCHC: 33.8 g/dL (ref 30.0–36.0)
MCV: 95.4 fL (ref 80.0–100.0)
Monocytes Absolute: 0.4 10*3/uL (ref 0.1–1.0)
Monocytes Relative: 7 %
Neutro Abs: 4.1 10*3/uL (ref 1.7–7.7)
Neutrophils Relative %: 70 %
Platelet Count: 164 10*3/uL (ref 150–400)
RBC: 4.77 MIL/uL (ref 3.87–5.11)
RDW: 12.8 % (ref 11.5–15.5)
WBC Count: 5.9 10*3/uL (ref 4.0–10.5)
nRBC: 0 % (ref 0.0–0.2)

## 2023-01-10 LAB — CMP (CANCER CENTER ONLY)
ALT: 24 U/L (ref 0–44)
AST: 27 U/L (ref 15–41)
Albumin: 4.5 g/dL (ref 3.5–5.0)
Alkaline Phosphatase: 50 U/L (ref 38–126)
Anion gap: 6 (ref 5–15)
BUN: 25 mg/dL — ABNORMAL HIGH (ref 8–23)
CO2: 29 mmol/L (ref 22–32)
Calcium: 9.8 mg/dL (ref 8.9–10.3)
Chloride: 100 mmol/L (ref 98–111)
Creatinine: 1.08 mg/dL — ABNORMAL HIGH (ref 0.44–1.00)
GFR, Estimated: 58 mL/min — ABNORMAL LOW (ref >60)
Glucose, Bld: 103 mg/dL — ABNORMAL HIGH (ref 70–99)
Potassium: 4.1 mmol/L (ref 3.5–5.1)
Sodium: 135 mmol/L (ref 135–145)
Total Bilirubin: 0.8 mg/dL (ref 0.3–1.2)
Total Protein: 7.1 g/dL (ref 6.5–8.1)

## 2023-01-10 NOTE — Progress Notes (Signed)
   [facility] OFF[facility] PROGRESS NOTE   Diagnosis: Multiple myeloma  INTERVAL HISTORY:   Cristina Garcia returns as scheduled.  She generally feels well.  No new pain.  No recent infection.  She continues follow-up with cardiology after the heart valve replacement.  Objective:  Vital signs in last 24 hours:  Blood pressure 123/78, pulse 67, temperature 98.1 F (36.7 C), temperature source Oral, resp. rate 18, height 5\' 9"  (1.753 m), weight 133 lb 4.8 oz (60.5 kg), SpO2 100%.    Resp: Lungs clear bilaterally Cardio: Regular rate and rhythm GI: No hepatosplenomegaly Vascular: No leg edema Musculoskeletal: Mild tenderness at the left posterior iliac, no pain with motion at the left hip   Lab Results:  Lab Results  Component Value Date   WBC 5.9 01/10/2023   HGB 15.4 (H) 01/10/2023   HCT 45.5 01/10/2023   MCV 95.4 01/10/2023   PLT 164 01/10/2023   NEUTROABS 4.1 01/10/2023    CMP  Lab Results  Component Value Date   NA 135 01/10/2023   K 4.1 01/10/2023   CL 100 01/10/2023   CO2 29 01/10/2023   GLUCOSE 103 (H) 01/10/2023   BUN 25 (H) 01/10/2023   CREATININE 1.08 (H) 01/10/2023   CALCIUM 9.8 01/10/2023   PROT 7.1 01/10/2023   ALBUMIN 4.5 01/10/2023   AST 27 01/10/2023   ALT 24 01/10/2023   ALKPHOS 50 01/10/2023   BILITOT 0.8 01/10/2023   GFRNONAA 58 (L) 01/10/2023   GFRAA 43 (L) 12/04/2019    Lab Results  Component Value Date   CEA 0.7 04/19/2006   CA125 28.1 11/24/2006      Medications: I have reviewed the patient's current medications.   Assessment/Plan: Multiple myeloma diagnosed in November 2007, IgA kappa, status post high-dose chemotherapy with autologous stem cell support at [facility] in June 2008. She remains in clinical remission.  history of Renal insufficiency secondary to multiple myeloma, now with chronic renal insufficiency History of hypokalemia. History of left pelvic pain secondary to a lytic left iliac lesion. A metastatic bone survey   04/20/2017 revealed stable lucencies at the left sacral ala and medial left iliac, questionable new tiny lucency at the superior aspect of the left sacral ala Simple cyst at the lower pole of the right kidney on a renal ultrasound, 11/08/2006. Multiseptated cyst of the left adnexa on an ultrasound, 11/08/2006. Osteopenia Mitral valve prolapse/mitral regurgitation -minimally invasive mitral valve repair 10/15/2015 Report of intermittent rectal bleeding. Normal colonoscopy by Dr. Russella Dar on 08/20/2011.       Disposition: Cristina Garcia remains in clinical remission for multiple myeloma.  We will follow-up on the myeloma panel from today.  She will return for an office and lab visit in 6 months.  She will follow-up with her primary provider to obtain the pneumococcal 20 vaccine.  She will stay up-to-date on the yearly influenza vaccine.  Thornton Papas, MD  01/10/2023  12:28 PM

## 2023-01-26 ENCOUNTER — Other Ambulatory Visit (HOSPITAL_COMMUNITY): Payer: BC Managed Care – PPO

## 2023-02-08 ENCOUNTER — Other Ambulatory Visit: Payer: Self-pay

## 2023-02-08 DIAGNOSIS — I10 Essential (primary) hypertension: Secondary | ICD-10-CM

## 2023-02-08 MED ORDER — LISINOPRIL 5 MG PO TABS: ORAL_TABLET | ORAL | 3 refills | Status: DC

## 2023-03-11 ENCOUNTER — Ambulatory Visit: Payer: BC Managed Care – PPO | Admitting: Cardiology

## 2023-05-23 ENCOUNTER — Encounter: Payer: Self-pay | Admitting: Family Medicine

## 2023-05-23 NOTE — Progress Notes (Unsigned)
  Cardiology Office Note:  .   Date:  05/23/2023  ID:  Cristina Garcia, DOB 1961-03-24, MRN [id number] PCP: Karie Georges, MD  [facility] Providers Cardiologist:  Meriam Sprague, MD (Inactive) { Click to update primary MD,subspecialty MD or APP then REFRESH:1}   History of Present Illness: .   Cristina Garcia is a 62 y.o. female with history of MV repair who presents for follow-up.      Problem List Severe mitral regurgitation/prolapse -MV repair 10/2015 with annuloplasty and Alfieri repair  -mild MR 07/21/2022 CMR 2. NICM -EF 52% with recovery  3. HTN 4. HLD 5. Multiple myeloma     ROS: All other ROS reviewed and negative. Pertinent positives noted in the HPI.     Studies Reviewed: Marland Kitchen       TEE 07/29/2021  1. Left ventricular ejection fraction, by estimation, is 50 to 55%. The  left ventricle has normal function. Left ventricular diastolic parameters  are indeterminate.   2. Right ventricular systolic function is normal. The right ventricular  size is normal.   3. Left atrial size was moderately dilated. No left atrial/left atrial  appendage thrombus was detected.   4. Right atrial size was moderately dilated.   5. The pericardial effusion is surrounding the apex.   6. Post complex repair 2017. Artifical chords to posterior medial  papillary muscle , annuloplasty ring and Alfieri stitch. Dual orrifice  with mild/moderate MR and mild MS      MR emminates from medial orrifice . The mitral valve has been  repaired/replaced. Mild to moderate mitral valve regurgitation. Mild  mitral stenosis.   7. Mild prolapse . The tricuspid valve is myxomatous.   8. The aortic valve is tricuspid. Aortic valve regurgitation is not  visualized. No aortic stenosis is present.  Physical Exam:   VS:  There were no vitals taken for this visit.   Wt Readings from Last 3 Encounters:  01/10/23 133 lb 4.8 oz (60.5 kg)  10/25/22 134 lb (60.8 kg)  08/20/22 136 lb 12.8 oz (62.1 kg)     GEN: Well nourished, well developed in no acute distress NECK: No JVD; No carotid bruits CARDIAC: ***RRR, no murmurs, rubs, gallops RESPIRATORY:  Clear to auscultation without rales, wheezing or rhonchi  ABDOMEN: Soft, non-tender, non-distended EXTREMITIES:  No edema; No deformity  ASSESSMENT AND PLAN: .   ***    {Are you ordering a CV Procedure (e.g. stress test, cath, DCCV, TEE, etc)?   Press F2        :[id number]}   Follow-up: No follow-ups on file.  Time Spent with Patient: I have spent a total of *** minutes caring for this patient today face to face, ordering and reviewing labs/tests, reviewing prior records/medical history, examining the patient, establishing an assessment and plan, communicating results/findings to the patient/family, and documenting in the medical record.   Signed, Lenna Gilford. Flora Lipps, MD, [facility]  [facility]  [address], [address] [place], [place] [address] [phone]  2:44 PM

## 2023-05-24 ENCOUNTER — Ambulatory Visit: Payer: BC Managed Care – PPO | Attending: Cardiovascular Disease | Admitting: Cardiovascular Disease

## 2023-05-24 ENCOUNTER — Encounter: Payer: Self-pay | Admitting: Cardiovascular Disease

## 2023-05-24 VITALS — BP 116/76 | HR 70 | Ht 69.0 in | Wt 134.4 lb

## 2023-05-24 DIAGNOSIS — Z9889 Other specified postprocedural states: Secondary | ICD-10-CM | POA: Diagnosis not present

## 2023-05-24 DIAGNOSIS — I10 Essential (primary) hypertension: Secondary | ICD-10-CM

## 2023-05-24 DIAGNOSIS — I340 Nonrheumatic mitral (valve) insufficiency: Secondary | ICD-10-CM | POA: Diagnosis not present

## 2023-05-24 DIAGNOSIS — E785 Hyperlipidemia, unspecified: Secondary | ICD-10-CM

## 2023-05-24 DIAGNOSIS — I5022 Chronic systolic (congestive) heart failure: Secondary | ICD-10-CM

## 2023-05-24 NOTE — Patient Instructions (Addendum)
Medication Instructions:  Your physician recommends that you continue on your current medications as directed. Please refer to the Current Medication list given to you today.    *If you need a refill on your cardiac medications before your next appointment, please call your pharmacy*   Lab Work: NONE    If you have labs (blood work) drawn today and your tests are completely normal, you will receive your results only by: MyChart Message (if you have MyChart) OR A paper copy in the mail If you have any lab test that is abnormal or we need to change your treatment, we will call you to review the results.   Testing/Procedures: NONE    Follow-Up: At [facility], you and your health needs are our priority.  As part of our continuing mission to provide you with exceptional heart care, we have created designated Provider Care Teams.  These Care Teams include your primary Cardiologist (physician) and Advanced Practice Providers (APPs -  Physician Assistants and Nurse Practitioners) who all work together to provide you with the care you need, when you need it.  We recommend signing up for the patient portal called "MyChart".  Sign up information is provided on this After Visit Summary.  MyChart is used to connect with patients for Virtual Visits (Telemedicine).  Patients are able to view lab/test results, encounter notes, upcoming appointments, etc.  Non-urgent messages can be sent to your provider as well.   To learn more about what you can do with MyChart, go to [email or web address].    Your next appointment:   1 year(s)  The format for your next appointment:   In Person  Provider:   Reatha Harps, MD    Other Instructions

## 2023-06-16 ENCOUNTER — Other Ambulatory Visit: Payer: Self-pay

## 2023-06-16 MED ORDER — METOPROLOL SUCCINATE ER 25 MG PO TB24: 12.5000 mg | ORAL_TABLET | Freq: Every day | ORAL | 3 refills | Status: DC

## 2023-07-14 ENCOUNTER — Inpatient Hospital Stay: Payer: BC Managed Care – PPO | Attending: Oncology | Admitting: Oncology

## 2023-07-14 ENCOUNTER — Other Ambulatory Visit: Payer: BC Managed Care – PPO

## 2023-07-14 ENCOUNTER — Inpatient Hospital Stay: Payer: BC Managed Care – PPO

## 2023-07-14 VITALS — BP 100/63 | HR 83 | Temp 97.9°F | Resp 18 | Ht 69.0 in | Wt 134.9 lb

## 2023-07-14 DIAGNOSIS — C9000 Multiple myeloma not having achieved remission: Secondary | ICD-10-CM | POA: Diagnosis not present

## 2023-07-14 DIAGNOSIS — C9001 Multiple myeloma in remission: Secondary | ICD-10-CM

## 2023-07-14 DIAGNOSIS — N189 Chronic kidney disease, unspecified: Secondary | ICD-10-CM | POA: Insufficient documentation

## 2023-07-14 DIAGNOSIS — M8580 Other specified disorders of bone density and structure, unspecified site: Secondary | ICD-10-CM | POA: Insufficient documentation

## 2023-07-14 DIAGNOSIS — K625 Hemorrhage of anus and rectum: Secondary | ICD-10-CM | POA: Diagnosis not present

## 2023-07-14 LAB — CMP (CANCER CENTER ONLY)
ALT: 14 U/L (ref 0–44)
AST: 20 U/L (ref 15–41)
Albumin: 4.2 g/dL (ref 3.5–5.0)
Alkaline Phosphatase: 53 U/L (ref 38–126)
Anion gap: 8 (ref 5–15)
BUN: 33 mg/dL — ABNORMAL HIGH (ref 8–23)
CO2: 27 mmol/L (ref 22–32)
Calcium: 10.1 mg/dL (ref 8.9–10.3)
Chloride: 105 mmol/L (ref 98–111)
Creatinine: 1.21 mg/dL — ABNORMAL HIGH (ref 0.44–1.00)
GFR, Estimated: 50 mL/min — ABNORMAL LOW (ref >60)
Glucose, Bld: 98 mg/dL (ref 70–99)
Potassium: 4 mmol/L (ref 3.5–5.1)
Sodium: 140 mmol/L (ref 135–145)
Total Bilirubin: 0.7 mg/dL (ref 0.0–1.2)
Total Protein: 6.9 g/dL (ref 6.5–8.1)

## 2023-07-14 LAB — CBC WITH DIFFERENTIAL (CANCER CENTER ONLY)
Abs Immature Granulocytes: 0.01 10*3/uL (ref 0.00–0.07)
Basophils Absolute: 0 10*3/uL (ref 0.0–0.1)
Basophils Relative: 0 %
Eosinophils Absolute: 0.1 10*3/uL (ref 0.0–0.5)
Eosinophils Relative: 1 %
HCT: 43.9 % (ref 36.0–46.0)
Hemoglobin: 14.9 g/dL (ref 12.0–15.0)
Immature Granulocytes: 0 %
Lymphocytes Relative: 30 %
Lymphs Abs: 1.4 10*3/uL (ref 0.7–4.0)
MCH: 32.2 pg (ref 26.0–34.0)
MCHC: 33.9 g/dL (ref 30.0–36.0)
MCV: 94.8 fL (ref 80.0–100.0)
Monocytes Absolute: 0.5 10*3/uL (ref 0.1–1.0)
Monocytes Relative: 9 %
Neutro Abs: 2.9 10*3/uL (ref 1.7–7.7)
Neutrophils Relative %: 60 %
Platelet Count: 190 10*3/uL (ref 150–400)
RBC: 4.63 MIL/uL (ref 3.87–5.11)
RDW: 12.8 % (ref 11.5–15.5)
WBC Count: 4.8 10*3/uL (ref 4.0–10.5)
nRBC: 0 % (ref 0.0–0.2)

## 2023-07-14 NOTE — Progress Notes (Signed)
  [facility] OFFICE PROGRESS NOTE   Diagnosis: Multiple myeloma  INTERVAL HISTORY:   Cristina Garcia returns as scheduled.  She has chronic discomfort at the left iliac and left foot.  No new areas of pain.  She had a recent "sinus "infection.  No other infections.  She has noted difficulty with memory recall.  Objective:  Vital signs in last 24 hours:  Blood pressure 100/63, pulse 83, temperature 97.9 F (36.6 C), temperature source Temporal, resp. rate 18, height 5\' 9"  (1.753 m), weight 134 lb 14.4 oz (61.2 kg), SpO2 98%.   Resp: Lungs clear bilaterally Cardio: Regular rate and rhythm GI: No hepatosplenomegaly Vascular: No leg edema Musculoskeletal: Tender at the left posterior iliac    Lab Results:  Lab Results  Component Value Date   WBC 4.8 07/14/2023   HGB 14.9 07/14/2023   HCT 43.9 07/14/2023   MCV 94.8 07/14/2023   PLT 190 07/14/2023   NEUTROABS 2.9 07/14/2023    CMP  Lab Results  Component Value Date   NA 135 01/10/2023   K 4.1 01/10/2023   CL 100 01/10/2023   CO2 29 01/10/2023   GLUCOSE 103 (H) 01/10/2023   BUN 25 (H) 01/10/2023   CREATININE 1.08 (H) 01/10/2023   CALCIUM 9.8 01/10/2023   PROT 7.1 01/10/2023   ALBUMIN 4.5 01/10/2023   AST 27 01/10/2023   ALT 24 01/10/2023   ALKPHOS 50 01/10/2023   BILITOT 0.8 01/10/2023   GFRNONAA 58 (L) 01/10/2023   GFRAA 43 (L) 12/04/2019    Lab Results  Component Value Date   CEA 0.7 04/19/2006   CA125 28.1 11/24/2006    Lab Results  Component Value Date   INR 1.7 01/09/2016   LABPROT 17.4 (H) 10/21/2015    Imaging:  No results found.  Medications: I have reviewed the patient's current medications.   Assessment/Plan: Multiple myeloma diagnosed in November 2007, IgA kappa, status post high-dose chemotherapy with autologous stem cell support at [facility] in June 2008. She remains in clinical remission.  history of Renal insufficiency secondary to multiple myeloma, now with chronic renal  insufficiency History of hypokalemia. History of left pelvic pain secondary to a lytic left iliac lesion. A metastatic bone survey  04/20/2017 revealed stable lucencies at the left sacral ala and medial left iliac, questionable new tiny lucency at the superior aspect of the left sacral ala Simple cyst at the lower pole of the right kidney on a renal ultrasound, 11/08/2006. Multiseptated cyst of the left adnexa on an ultrasound, 11/08/2006. Osteopenia Mitral valve prolapse/mitral regurgitation -minimally invasive mitral valve repair 10/15/2015 Report of intermittent rectal bleeding. Normal colonoscopy by Dr. Russella Dar on 08/20/2011.       Disposition: Cristina Garcia remains in clinical remission for multiple myeloma.  We will follow-up on the myeloma panel from today.  The myeloma panel was negative in July 2024.  She will complete a metastatic bone survey next month.  I encouraged her to obtain influenza and pneumococcal 20 vaccines.  She is followed by cardiology for mitral regurgitation after undergoing a mitral valve repair in 2017.  Cristina Garcia will return for an office and lab visit in 6 months.  Thornton Papas, MD  07/14/2023  3:21 PM

## 2023-07-17 LAB — KAPPA/LAMBDA LIGHT CHAINS
Kappa free light chain: 22.9 mg/L — ABNORMAL HIGH (ref 3.3–19.4)
Kappa, lambda light chain ratio: 1.42 (ref 0.26–1.65)
Lambda free light chains: 16.1 mg/L (ref 5.7–26.3)

## 2023-07-19 LAB — MULTIPLE MYELOMA PANEL, SERUM
Albumin SerPl Elph-Mcnc: 3.8 g/dL (ref 2.9–4.4)
Albumin/Glob SerPl: 1.6 (ref 0.7–1.7)
Alpha 1: 0.2 g/dL (ref 0.0–0.4)
Alpha2 Glob SerPl Elph-Mcnc: 0.6 g/dL (ref 0.4–1.0)
B-Globulin SerPl Elph-Mcnc: 0.8 g/dL (ref 0.7–1.3)
Gamma Glob SerPl Elph-Mcnc: 0.8 g/dL (ref 0.4–1.8)
Globulin, Total: 2.4 g/dL (ref 2.2–3.9)
IgA: 100 mg/dL (ref 87–352)
IgG (Immunoglobin G), Serum: 846 mg/dL (ref 586–1602)
IgM (Immunoglobulin M), Srm: 77 mg/dL (ref 26–217)
Total Protein ELP: 6.2 g/dL (ref 6.0–8.5)

## 2023-07-21 ENCOUNTER — Encounter: Payer: Self-pay | Admitting: Oncology

## 2023-07-22 ENCOUNTER — Ambulatory Visit (HOSPITAL_COMMUNITY): Payer: BC Managed Care – PPO | Attending: Cardiology

## 2023-07-22 DIAGNOSIS — I10 Essential (primary) hypertension: Secondary | ICD-10-CM | POA: Insufficient documentation

## 2023-07-22 DIAGNOSIS — E785 Hyperlipidemia, unspecified: Secondary | ICD-10-CM | POA: Insufficient documentation

## 2023-07-22 DIAGNOSIS — I340 Nonrheumatic mitral (valve) insufficiency: Secondary | ICD-10-CM | POA: Insufficient documentation

## 2023-07-22 DIAGNOSIS — I5022 Chronic systolic (congestive) heart failure: Secondary | ICD-10-CM | POA: Diagnosis not present

## 2023-07-22 DIAGNOSIS — Z9889 Other specified postprocedural states: Secondary | ICD-10-CM | POA: Insufficient documentation

## 2023-07-22 LAB — ECHOCARDIOGRAM COMPLETE
Area-P 1/2: 2.26 cm2
MV M vel: 4.95 m/s
MV Peak grad: 98 mm[Hg]
MV VTI: 1.09 cm2
Radius: 0.6 cm
S' Lateral: 3.2 cm

## 2023-07-25 ENCOUNTER — Encounter: Payer: Self-pay | Admitting: Cardiovascular Disease

## 2023-08-18 ENCOUNTER — Encounter: Payer: Self-pay | Admitting: Family Medicine

## 2023-08-18 ENCOUNTER — Ambulatory Visit (INDEPENDENT_AMBULATORY_CARE_PROVIDER_SITE_OTHER): Payer: BC Managed Care – PPO | Admitting: Family Medicine

## 2023-08-18 VITALS — BP 110/62 | HR 72 | Temp 97.9°F | Ht 69.0 in | Wt 136.5 lb

## 2023-08-18 DIAGNOSIS — Z1231 Encounter for screening mammogram for malignant neoplasm of breast: Secondary | ICD-10-CM

## 2023-08-18 DIAGNOSIS — Z13220 Encounter for screening for lipoid disorders: Secondary | ICD-10-CM | POA: Diagnosis not present

## 2023-08-18 DIAGNOSIS — Z9889 Other specified postprocedural states: Secondary | ICD-10-CM

## 2023-08-18 DIAGNOSIS — Z1211 Encounter for screening for malignant neoplasm of colon: Secondary | ICD-10-CM | POA: Diagnosis not present

## 2023-08-18 DIAGNOSIS — Z0000 Encounter for general adult medical examination without abnormal findings: Secondary | ICD-10-CM | POA: Diagnosis not present

## 2023-08-18 MED ORDER — ACYCLOVIR 200 MG PO CAPS: ORAL_CAPSULE | ORAL | 0 refills | Status: DC

## 2023-08-18 MED ORDER — AMOXICILLIN 500 MG PO TABS: 2000.0000 mg | ORAL_TABLET | Freq: Once | ORAL | 0 refills | Status: DC

## 2023-08-18 NOTE — Progress Notes (Signed)
 Complete physical exam  Patient: Cristina Garcia   DOB: August 29, 1960   63 y.o. Female  MRN: [id number]  Subjective:    Chief Complaint  Patient presents with   Annual Exam    Cristina Garcia is a 63 y.o. female who presents today for a complete physical exam. She reports consuming a general diet. Eats more low cholesterol types of foods, consumes dairy and sources of vitamin D  The patient does not participate in regular exercise at present. She generally feels well. She reports sleeping well. She does not have additional problems to discuss today.    Most recent fall risk assessment:    10/25/2022    2:50 PM  Fall Risk   Falls in the past year? 0  Number falls in past yr: 0  Injury with Fall? 0  Risk for fall due to : No Fall Risks  Follow up Falls evaluation completed     Most recent depression screenings:    08/18/2023    2:53 PM 10/25/2022    2:50 PM  PHQ 2/9 Scores  PHQ - 2 Score 0 0  PHQ- 9 Score 0 2    Vision:Within last year and Dental: No current dental problems and Receives regular dental care  Patient Active Problem List   Diagnosis Date Noted   S/P minimally invasive mitral valve repair 10/15/2015   Chronic kidney disease (CKD)    Atrial tachycardia (HCC) 12/12/2014   Left foot pain 11/05/2014   Mitral regurgitation 11/09/2013   Chest wall pain 07/24/2013   Reactive airway disease 07/24/2013   Prolapse of mitral valve 06/21/2013   Osteoporosis 12/24/2008   Essential hypertension 11/06/2008   MULTIPLE MYELOMA, IN REMISSION 03/13/2007   Allergies 01/13/2007      Patient Care Team: Karie Georges, MD as PCP - General (Family Medicine) O'Neal, Ronnald Ramp, MD as PCP - Cardiology (Cardiology) Ladene Artist, MD as Attending Physician (Hematology and Oncology) Elmon Kirschner A (Hematology and Oncology)   Outpatient Medications Prior to Visit  Medication Sig   aspirin 81 MG tablet Take 81 mg by mouth daily.   cetirizine (ZYRTEC) 10 MG tablet  Take 10 mg by mouth daily.   lisinopril (ZESTRIL) 5 MG tablet TAKE 1 TABLET BY MOUTH EVERY DAY IN THE MORNING   Magnesium Oxide 250 MG TABS Take 250 mg by mouth daily.   metoprolol succinate (TOPROL-XL) 25 MG 24 hr tablet Take 0.5 tablets (12.5 mg total) by mouth daily.   Multiple Vitamins-Minerals (MULTIVITAL PO) Take 1 tablet by mouth daily.   No facility-administered medications prior to visit.    Review of Systems  HENT:  Negative for hearing loss.   Eyes:  Negative for blurred vision.  Respiratory:  Negative for shortness of breath.   Cardiovascular:  Negative for chest pain.  Gastrointestinal: Negative.   Genitourinary: Negative.   Musculoskeletal:  Negative for back pain.  Neurological:  Negative for headaches.  Psychiatric/Behavioral:  Negative for depression.        Objective:     BP 110/62   Pulse 72   Temp 97.9 F (36.6 C) (Oral)   Ht 5\' 9"  (1.753 m)   Wt 136 lb 8 oz (61.9 kg)   SpO2 99%   BMI 20.16 kg/m    Physical Exam Vitals reviewed.  Constitutional:      Appearance: Normal appearance. She is well-groomed and normal weight.  HENT:     Right Ear: Tympanic membrane and ear canal normal.  Left Ear: Tympanic membrane and ear canal normal.     Mouth/Throat:     Mouth: Mucous membranes are moist.     Pharynx: No posterior oropharyngeal erythema.  Eyes:     Conjunctiva/sclera: Conjunctivae normal.  Neck:     Thyroid: No thyromegaly.  Cardiovascular:     Rate and Rhythm: Normal rate and regular rhythm.     Pulses: Normal pulses.     Heart sounds: S1 normal and S2 normal.  Pulmonary:     Effort: Pulmonary effort is normal.     Breath sounds: Normal breath sounds and air entry.  Abdominal:     General: Abdomen is flat. Bowel sounds are normal.  Musculoskeletal:     Right lower leg: No edema.     Left lower leg: No edema.  Lymphadenopathy:     Cervical: No cervical adenopathy.  Neurological:     Mental Status: She is alert and oriented to  person, place, and time. Mental status is at baseline.     Gait: Gait is intact.  Psychiatric:        Mood and Affect: Mood and affect normal.        Speech: Speech normal.        Behavior: Behavior normal.        Judgment: Judgment normal.      No results found for any visits on 08/18/23.     Assessment & Plan:    Routine Health Maintenance and Physical Exam  Immunization History  Administered Date(s) Administered   Influenza Split 06/10/2011   Influenza Whole 04/10/2007   Influenza,inj,Quad PF,6+ Mos 06/21/2013, 06/24/2014, 04/15/2015, 04/15/2016, 04/13/2017, 04/11/2018, 04/25/2019, 05/19/2020   Influenza-Unspecified 04/16/2021, 03/19/2022, 07/28/2023   PFIZER(Purple Top)SARS-COV-2 Vaccination 08/22/2019, 09/12/2019, 05/05/2020   Pfizer Covid-19 Vaccine Bivalent Booster 58yrs & up 03/19/2022   Pneumococcal Conjugate-13 08/08/2014   Pneumococcal Polysaccharide-23 07/21/2009, 10/03/2014, 12/04/2019   Td 06/15/1995, 04/10/2007   Tdap 05/19/2020    Health Maintenance  Topic Date Due   Colonoscopy  08/19/2021   MAMMOGRAM  12/09/2022   COVID-19 Vaccine (5 - 2024-25 season) 02/13/2023   Zoster Vaccines- Shingrix (1 of 2) 11/18/2023 (Originally 04/24/1978)   Pneumococcal Vaccine 94-9 Years old (4 of 4 - PPSV23 or PCV20) 12/03/2024   Cervical Cancer Screening (HPV/Pap Cotest)  05/19/2025   DTaP/Tdap/Td (4 - Td or Tdap) 05/19/2030   INFLUENZA VACCINE  Completed   HPV VACCINES  Aged Out   Hepatitis C Screening  Discontinued   HIV Screening  Discontinued    Discussed health benefits of physical activity, and encouraged her to engage in regular exercise appropriate for her age and condition.  Routine general medical examination at a health care facility  Lipid screening -     Lipid panel; Future  Colon cancer screening -     Ambulatory referral to Gastroenterology  Breast cancer screening by mammogram -     3D Screening Mammogram, Left and Right; Future  S/P minimally  invasive mitral valve repair -     Amoxicillin; Take 4 tablets (2,000 mg total) by mouth once for 1 dose.  Dispense: 4 tablet; Refill: 0  Other orders -     Acyclovir; Take 3 capsules by mouth in the morning, 2 in the evening until well and then take 1 daily  Dispense: 90 capsule; Refill: 0  Normal physical exam findings today. I counseled the patient on the CDC recommendations for physical activity. Handouts given on healthy eating and exercise. Labs ordered for annual surveillance.  Return in about 1 year (around 08/17/2024) for annual physical exam.     Karie Georges, MD

## 2023-09-02 ENCOUNTER — Other Ambulatory Visit: Payer: Self-pay | Admitting: Family Medicine

## 2023-09-02 ENCOUNTER — Encounter: Payer: Self-pay | Admitting: Family Medicine

## 2023-09-02 DIAGNOSIS — Z9889 Other specified postprocedural states: Secondary | ICD-10-CM

## 2023-09-02 NOTE — Telephone Encounter (Signed)
 I sent this in 2 weeks ago, is she having another procedure?

## 2023-09-13 ENCOUNTER — Other Ambulatory Visit: Payer: Self-pay | Admitting: Family Medicine

## 2023-09-13 DIAGNOSIS — Z9889 Other specified postprocedural states: Secondary | ICD-10-CM

## 2023-09-14 ENCOUNTER — Encounter: Payer: Self-pay | Admitting: Family Medicine

## 2023-09-14 ENCOUNTER — Other Ambulatory Visit (INDEPENDENT_AMBULATORY_CARE_PROVIDER_SITE_OTHER)

## 2023-09-14 DIAGNOSIS — Z13220 Encounter for screening for lipoid disorders: Secondary | ICD-10-CM

## 2023-09-14 LAB — LIPID PANEL
Cholesterol: 235 mg/dL — ABNORMAL HIGH (ref 0–200)
HDL: 92.6 mg/dL (ref >39.00)
LDL Cholesterol: 130 mg/dL — ABNORMAL HIGH (ref 0–99)
NonHDL: 142.48
Total CHOL/HDL Ratio: 3
Triglycerides: 60 mg/dL (ref 0.0–149.0)
VLDL: 12 mg/dL (ref 0.0–40.0)

## 2023-09-25 ENCOUNTER — Other Ambulatory Visit: Payer: Self-pay | Admitting: Family Medicine

## 2023-09-25 DIAGNOSIS — Z9889 Other specified postprocedural states: Secondary | ICD-10-CM

## 2023-09-27 ENCOUNTER — Ambulatory Visit
Admission: RE | Admit: 2023-09-27 | Discharge: 2023-09-27 | Disposition: A | Discharge status: Home / Self Care | Source: Ambulatory Visit | Attending: Family Medicine | Admitting: Family Medicine

## 2023-09-27 DIAGNOSIS — Z1231 Encounter for screening mammogram for malignant neoplasm of breast: Secondary | ICD-10-CM | POA: Diagnosis not present

## 2023-10-03 ENCOUNTER — Encounter: Payer: Self-pay | Admitting: Family Medicine

## 2023-10-14 ENCOUNTER — Ambulatory Visit (HOSPITAL_BASED_OUTPATIENT_CLINIC_OR_DEPARTMENT_OTHER)
Admission: RE | Admit: 2023-10-14 | Discharge: 2023-10-14 | Disposition: A | Discharge status: Home / Self Care | Source: Ambulatory Visit | Attending: Oncology | Admitting: Oncology

## 2023-10-14 DIAGNOSIS — C9000 Multiple myeloma not having achieved remission: Secondary | ICD-10-CM | POA: Diagnosis not present

## 2023-10-14 DIAGNOSIS — C9001 Multiple myeloma in remission: Secondary | ICD-10-CM | POA: Insufficient documentation

## 2023-11-05 ENCOUNTER — Other Ambulatory Visit: Payer: Self-pay | Admitting: Family Medicine

## 2023-11-17 ENCOUNTER — Encounter: Payer: Self-pay | Admitting: Family Medicine

## 2024-01-12 ENCOUNTER — Inpatient Hospital Stay: Payer: BC Managed Care – PPO | Attending: Oncology

## 2024-01-12 ENCOUNTER — Inpatient Hospital Stay (HOSPITAL_BASED_OUTPATIENT_CLINIC_OR_DEPARTMENT_OTHER): Payer: BC Managed Care – PPO | Admitting: Oncology

## 2024-01-12 VITALS — BP 103/85 | HR 71 | Temp 98.1°F | Resp 18 | Ht 69.0 in | Wt 136.3 lb

## 2024-01-12 DIAGNOSIS — C9000 Multiple myeloma not having achieved remission: Secondary | ICD-10-CM | POA: Insufficient documentation

## 2024-01-12 DIAGNOSIS — I340 Nonrheumatic mitral (valve) insufficiency: Secondary | ICD-10-CM | POA: Diagnosis not present

## 2024-01-12 DIAGNOSIS — M8580 Other specified disorders of bone density and structure, unspecified site: Secondary | ICD-10-CM | POA: Diagnosis not present

## 2024-01-12 DIAGNOSIS — K625 Hemorrhage of anus and rectum: Secondary | ICD-10-CM | POA: Insufficient documentation

## 2024-01-12 DIAGNOSIS — Z9484 Stem cells transplant status: Secondary | ICD-10-CM | POA: Insufficient documentation

## 2024-01-12 DIAGNOSIS — I341 Nonrheumatic mitral (valve) prolapse: Secondary | ICD-10-CM | POA: Diagnosis not present

## 2024-01-12 DIAGNOSIS — C9001 Multiple myeloma in remission: Secondary | ICD-10-CM | POA: Diagnosis not present

## 2024-01-12 DIAGNOSIS — N182 Chronic kidney disease, stage 2 (mild): Secondary | ICD-10-CM | POA: Insufficient documentation

## 2024-01-12 DIAGNOSIS — Z9221 Personal history of antineoplastic chemotherapy: Secondary | ICD-10-CM | POA: Insufficient documentation

## 2024-01-12 LAB — CBC WITH DIFFERENTIAL (CANCER CENTER ONLY)
Abs Immature Granulocytes: 0.01 K/uL (ref 0.00–0.07)
Basophils Absolute: 0 K/uL (ref 0.0–0.1)
Basophils Relative: 0 %
Eosinophils Absolute: 0.1 K/uL (ref 0.0–0.5)
Eosinophils Relative: 2 %
HCT: 42.8 % (ref 36.0–46.0)
Hemoglobin: 14.7 g/dL (ref 12.0–15.0)
Immature Granulocytes: 0 %
Lymphocytes Relative: 27 %
Lymphs Abs: 1.3 K/uL (ref 0.7–4.0)
MCH: 32.9 pg (ref 26.0–34.0)
MCHC: 34.3 g/dL (ref 30.0–36.0)
MCV: 95.7 fL (ref 80.0–100.0)
Monocytes Absolute: 0.4 K/uL (ref 0.1–1.0)
Monocytes Relative: 9 %
Neutro Abs: 3 K/uL (ref 1.7–7.7)
Neutrophils Relative %: 62 %
Platelet Count: 170 K/uL (ref 150–400)
RBC: 4.47 MIL/uL (ref 3.87–5.11)
RDW: 12.5 % (ref 11.5–15.5)
WBC Count: 4.8 K/uL (ref 4.0–10.5)
nRBC: 0 % (ref 0.0–0.2)

## 2024-01-12 LAB — CMP (CANCER CENTER ONLY)
ALT: 23 U/L (ref 0–44)
AST: 27 U/L (ref 15–41)
Albumin: 4.4 g/dL (ref 3.5–5.0)
Alkaline Phosphatase: 65 U/L (ref 38–126)
Anion gap: 6 (ref 5–15)
BUN: 29 mg/dL — ABNORMAL HIGH (ref 8–23)
CO2: 30 mmol/L (ref 22–32)
Calcium: 10.1 mg/dL (ref 8.9–10.3)
Chloride: 102 mmol/L (ref 98–111)
Creatinine: 1.21 mg/dL — ABNORMAL HIGH (ref 0.44–1.00)
GFR, Estimated: 50 mL/min — ABNORMAL LOW (ref >60)
Glucose, Bld: 109 mg/dL — ABNORMAL HIGH (ref 70–99)
Potassium: 4.1 mmol/L (ref 3.5–5.1)
Sodium: 138 mmol/L (ref 135–145)
Total Bilirubin: 0.7 mg/dL (ref 0.0–1.2)
Total Protein: 6.6 g/dL (ref 6.5–8.1)

## 2024-01-12 LAB — VITAMIN B12: Vitamin B-12: 1206 pg/mL — ABNORMAL HIGH (ref 180–914)

## 2024-01-12 NOTE — Progress Notes (Signed)
  [facility] OFFICE PROGRESS NOTE   Diagnosis: Multiple myeloma  INTERVAL HISTORY:   Ms. [name] returns as scheduled.  She has mild pelvic bone pain in the evenings.  No new site of pain.  She generally feels well.  Objective:  Vital signs in last 24 hours:  Blood pressure 103/85, pulse 71, temperature 98.1 F (36.7 C), temperature source Temporal, resp. rate 18, height 5' 9 (1.753 m), weight 136 lb 4.8 oz (61.8 kg), SpO2 100%.   Resp: Lungs clear bilaterally Cardio: Regular rate and rhythm with premature beats GI: No hepatosplenomegaly Vascular: No leg edema Musculoskeletal: No pain with motion at the hips   Lab Results:  Lab Results  Component Value Date   WBC 4.8 01/12/2024   HGB 14.7 01/12/2024   HCT 42.8 01/12/2024   MCV 95.7 01/12/2024   PLT 170 01/12/2024   NEUTROABS 3.0 01/12/2024    CMP  Lab Results  Component Value Date   NA 138 01/12/2024   K 4.1 01/12/2024   CL 102 01/12/2024   CO2 30 01/12/2024   GLUCOSE 109 (H) 01/12/2024   BUN 29 (H) 01/12/2024   CREATININE 1.21 (H) 01/12/2024   CALCIUM  10.1 01/12/2024   PROT 6.6 01/12/2024   ALBUMIN  4.4 01/12/2024   AST 27 01/12/2024   ALT 23 01/12/2024   ALKPHOS 65 01/12/2024   BILITOT 0.7 01/12/2024   GFRNONAA 50 (L) 01/12/2024   GFRAA 43 (L) 12/04/2019    Lab Results  Component Value Date   CEA 0.7 04/19/2006   CA125 28.1 11/24/2006    Medications: I have reviewed the patient's current medications.   Assessment/Plan:  Multiple myeloma diagnosed in November 2007, IgA kappa, status post high-dose chemotherapy with autologous stem cell support at [facility] in June 2008. She remains in clinical remission.  history of Renal insufficiency secondary to multiple myeloma, now with chronic renal insufficiency History of hypokalemia. History of left pelvic pain secondary to a lytic left iliac lesion. A metastatic bone survey  04/20/2017 revealed stable lucencies at the left sacral ala and medial  left iliac, questionable new tiny lucency at the superior aspect of the left sacral ala Simple cyst at the lower pole of the right kidney on a renal ultrasound, 11/08/2006. Multiseptated cyst of the left adnexa on an ultrasound, 11/08/2006. Osteopenia Mitral valve prolapse/mitral regurgitation -minimally invasive mitral valve repair 10/15/2015 Report of intermittent rectal bleeding. Normal colonoscopy by Dr. Aneita on 08/20/2011.     Disposition: Cristina Garcia remains in clinical remission from multiple myeloma.  The myeloma panel was negative in January.  Will follow-up on the myeloma panel from today.  She has chronic mild renal insufficiency.  A metastatic bone survey on 10/14/2023 revealed no lytic lesions.  The previously noted ill-defined left pelvic lytic lesions are seen.  She will return for an office and lab visit in 6 months.  She will remain up-to-date on pneumonia and influenza vaccines.  Arley Hof, MD  01/12/2024  3:20 PM

## 2024-01-13 ENCOUNTER — Encounter: Payer: Self-pay | Admitting: Oncology

## 2024-01-13 ENCOUNTER — Ambulatory Visit: Payer: Self-pay | Admitting: Oncology

## 2024-01-13 LAB — KAPPA/LAMBDA LIGHT CHAINS
Kappa free light chain: 18.8 mg/L (ref 3.3–19.4)
Kappa, lambda light chain ratio: 1.26 (ref 0.26–1.65)
Lambda free light chains: 14.9 mg/L (ref 5.7–26.3)

## 2024-01-13 NOTE — Telephone Encounter (Signed)
 Patient gave verbal understanding and had no further questions or concerns

## 2024-01-13 NOTE — Telephone Encounter (Signed)
-----   Message from Arley Hof sent at 01/13/2024  7:31 AM EDT ----- Please call patient, the vitamin B12 level is high, is she taking vitamin B12?  Did we order the B12 level?  ----- Message ----- From: Interface, Lab In [place] Sent: 01/12/2024   2:30 PM EDT To: Arley KATHEE Hof, MD

## 2024-01-16 LAB — MULTIPLE MYELOMA PANEL, SERUM
Albumin SerPl Elph-Mcnc: 4.6 g/dL — ABNORMAL HIGH (ref 2.9–4.4)
Albumin/Glob SerPl: 2.6 — ABNORMAL HIGH (ref 0.7–1.7)
Alpha 1: 0.1 g/dL (ref 0.0–0.4)
Alpha2 Glob SerPl Elph-Mcnc: 0.5 g/dL (ref 0.4–1.0)
B-Globulin SerPl Elph-Mcnc: 0.7 g/dL (ref 0.7–1.3)
Gamma Glob SerPl Elph-Mcnc: 0.6 g/dL (ref 0.4–1.8)
Globulin, Total: 1.8 g/dL — ABNORMAL LOW (ref 2.2–3.9)
IgA: 101 mg/dL (ref 87–352)
IgG (Immunoglobin G), Serum: 834 mg/dL (ref 586–1602)
IgM (Immunoglobulin M), Srm: 83 mg/dL (ref 26–217)
Total Protein ELP: 6.4 g/dL (ref 6.0–8.5)

## 2024-03-05 ENCOUNTER — Encounter: Payer: Self-pay | Admitting: Family Medicine

## 2024-05-15 ENCOUNTER — Telehealth: Payer: Self-pay | Admitting: Family Medicine

## 2024-05-15 DIAGNOSIS — I10 Essential (primary) hypertension: Secondary | ICD-10-CM

## 2024-05-15 NOTE — Telephone Encounter (Signed)
 Copied from CRM #[id number]. Topic: Clinical - Medication Refill >> May 15, 2024  1:46 PM Ashley R wrote: Medication: lisinopril  (ZESTRIL ) 5 MG tablet  Has the patient contacted their pharmacy? Yes  This is the patient's preferred pharmacy:  [id number] [place], [place] - [address] [place] [address] Phone: [phone] Fax: [phone]  Is this the correct pharmacy for this prescription? Yes If no, delete pharmacy and type the correct one.   Has the prescription been filled recently? Yes  Is the patient out of the medication? Yes  Has the patient been seen for an appointment in the last year OR does the patient have an upcoming appointment? Yes  Can we respond through MyChart? Yes  Agent: Please be advised that Rx refills may take up to 3 business days. We ask that you follow-up with your pharmacy.

## 2024-05-17 MED ORDER — LISINOPRIL 5 MG PO TABS: ORAL_TABLET | ORAL | 0 refills | Status: AC

## 2024-06-03 ENCOUNTER — Other Ambulatory Visit: Payer: Self-pay | Admitting: Cardiovascular Disease

## 2024-06-11 ENCOUNTER — Telehealth: Payer: Self-pay | Admitting: Cardiovascular Disease

## 2024-06-11 DIAGNOSIS — I340 Nonrheumatic mitral (valve) insufficiency: Secondary | ICD-10-CM

## 2024-06-11 DIAGNOSIS — I10 Essential (primary) hypertension: Secondary | ICD-10-CM

## 2024-06-11 DIAGNOSIS — Z9889 Other specified postprocedural states: Secondary | ICD-10-CM

## 2024-06-11 NOTE — Telephone Encounter (Signed)
 Pt would like a c/b regarding order for Echo. Pt states she normally has Echo done prior to coming in for annual f/u. She would like a c/b to once Echo order has been put in. Please advise

## 2024-06-13 NOTE — Addendum Note (Signed)
 Addended by: LORRENE FEDERICO CROME on: 06/13/2024 02:03 PM   Modules accepted: Orders

## 2024-06-21 ENCOUNTER — Ambulatory Visit (HOSPITAL_COMMUNITY)
Admission: RE | Admit: 2024-06-21 | Discharge: 2024-06-21 | Disposition: A | Discharge status: Home / Self Care | Payer: Self-pay | Source: Ambulatory Visit | Attending: Cardiovascular Disease | Admitting: Cardiovascular Disease

## 2024-06-21 ENCOUNTER — Ambulatory Visit: Payer: Self-pay | Admitting: Cardiovascular Disease

## 2024-06-21 DIAGNOSIS — Z9889 Other specified postprocedural states: Secondary | ICD-10-CM | POA: Insufficient documentation

## 2024-06-21 DIAGNOSIS — I10 Essential (primary) hypertension: Secondary | ICD-10-CM | POA: Diagnosis present

## 2024-06-21 DIAGNOSIS — I340 Nonrheumatic mitral (valve) insufficiency: Secondary | ICD-10-CM | POA: Diagnosis present

## 2024-06-21 LAB — ECHOCARDIOGRAM COMPLETE
AV Area VTI: 1.81 cm2
Area-P 1/2: 1.71 cm2
Calc EF: 55.1 %
MV VTI: 1.07 cm2
P 1/2 time: 129 ms
S' Lateral: 3.5 cm
Single Plane A2C EF: 56.5 %
Single Plane A4C EF: 51 %

## 2024-06-25 NOTE — Progress Notes (Signed)
 " Cardiology Office Note:  .   Date:  07/03/2024  ID:  Cristina Garcia, DOB [birth year], MRN [id number] PCP: Ozell Heron HERO, MD  [facility] Providers Cardiologist:  Darryle ONEIDA Decent, MD   History of Present Illness: .    Chief Complaint  Patient presents with   Follow-up    Cristina Garcia is a 64 y.o. female with below history who presents for follow-up.   History of Present Illness   Cristina Garcia is a 64 year old female with severe mitral valve regurgitation status post annuloplasty with Alfieri repair who presents for follow-up.  She has a history of severe mitral valve regurgitation and underwent annuloplasty with Alfieri repair in 2017. Currently, she has mild residual mitral valve regurgitation and feels generally well, though she is tired from running errands related to building a new house. No new issues or diagnoses are reported.  She has ischemic cardiomyopathy with a history of reduced ejection fraction following her mitral valve surgery. She is on metoprolol  succinate 12.5 mg daily and lisinopril  5 mg daily for blood pressure control. She feels her heart more at night when lying down but does not experience significant symptoms such as racing or fluttering during the day.  She is trying to resume exercise following foot surgery, noting she was able [place] issue. She wants to increase her physical activity, although she attributes some inactivity to 'laziness' and weather conditions.  In terms of social history, she is in the process of building a new house, opting for a smaller, one-story home to accommodate aging and reduce maintenance. She mentions moving to [place] for lower taxes and a walkable neighborhood with amenities like a pool.           Problem List Severe mitral regurgitation/prolapse -MV repair 10/2015 with annuloplasty and Alfieri repair  -mild MR 07/21/2022 CMR 2. NICM -EF 52% with recovery  3. HTN 4.  HLD -T chol 251, HDL 100, LDL 133, TG 83 5. Multiple myeloma  -BM Tx 2008 6. PACs    ROS: All other ROS reviewed and negative. Pertinent positives noted in the HPI.     Studies Reviewed: SABRA   EKG Interpretation Date/Time:  Tuesday July 03 2024 13:25:43 EST Ventricular Rate:  70 PR Interval:  168 QRS Duration:  96 QT Interval:  434 QTC Calculation: 468 R Axis:   -65  Text Interpretation: Sinus rhythm with sinus arrhythmia with premature ventricular or aberrantly conducted complexes Possible Left atrial enlargement Incomplete right bundle branch block Left anterior fascicular block Confirmed by Decent Darryle [phone]) on 07/03/2024 1:42:19 PM   TTE 06/21/2024 1. Left ventricular ejection fraction, by estimation, is 55 to 60%. The  left ventricle has normal function. The left ventricle has no regional  wall motion abnormalities. Left ventricular diastolic function could not  be evaluated.   2. Right ventricular systolic function is normal. The right ventricular  size is normal. There is normal pulmonary artery systolic pressure. The  estimated right ventricular systolic pressure is 31.3 mmHg.   3. S/P complex repair 10/2015 (artifical chords to posterior medial  papillary muscle, 38 Edwards Physio II ring annuloplasty ring and Alfieri  stitch). Dual orrifice with mild/moderate MR (predominately from medial  orrifice). Probable mild mitral  stenosis (peak velocity 1.69m/s, MG , TVI ratio 2.9, EOA 1.06cm2, PHT  , RVSP ).   4. Tricuspid valve regurgitation is mild to moderate.   5. The aortic valve is  tricuspid. Aortic valve regurgitation is trivial.  Aortic valve sclerosis is present, with no evidence of aortic valve  stenosis.   6. The inferior vena cava is normal in size with greater than 50%  respiratory variability, suggesting right atrial pressure of 3 mmHg.   7. Cannot exclude a small PFO.  Physical Exam:   VS:  BP (!) 110/58 (BP Location: Right Arm, Patient  Position: Sitting, Cuff Size: Normal)   Pulse 82   Ht 5' 9 (1.753 m)   Wt 137 lb 1.6 oz (62.2 kg)   SpO2 98%   BMI 20.25 kg/m    Wt Readings from Last 3 Encounters:  07/03/24 137 lb 1.6 oz (62.2 kg)  01/12/24 136 lb 4.8 oz (61.8 kg)  08/18/23 136 lb 8 oz (61.9 kg)    GEN: Well nourished, well developed in no acute distress NECK: No JVD; No carotid bruits CARDIAC: RRR, no murmurs, rubs, gallops RESPIRATORY:  Clear to auscultation without rales, wheezing or rhonchi  ABDOMEN: Soft, non-tender, non-distended EXTREMITIES:  No edema; No deformity  ASSESSMENT AND PLAN: .   Assessment and Plan    Mitral valve regurgitation status post mitral valve repair Severe mitral valve regurgitation post-repair with mild residual regurgitation. Heart pressures normal, stable condition. Future valve replacement possible. - Continue aspirin . SBE prophylaxis. - Continue yearly echocardiograms to evaluate mitral valve.  Chronic systolic heart failure with recovered ejection fraction Ejection fraction stable at 55-60% post-mitral valve surgery. - Continue lisinopril  5 mg daily. - Continue metoprolol  succinate 12.5 mg daily.  Essential hypertension Blood pressure well controlled with current regimen. - Continue lisinopril  5 mg daily. - Continue metoprolol  succinate 12.5 mg daily.  Premature atrial contractions Single PAC on EKG, stable condition. Metoprolol  effective. - Continue metoprolol  succinate 12.5 mg daily. - Monitor for symptoms such as heart racing, significant shortness of breath, or chest discomfort.             Follow-up: Return in about 1 year (around 07/03/2025).  Signed, Darryle DASEN. Barbaraann, MD, [facility]  [facility]  [address] [place], [place] [address] [phone]  1:45 PM   "

## 2024-07-03 ENCOUNTER — Ambulatory Visit: Payer: Self-pay | Admitting: Cardiovascular Disease

## 2024-07-03 ENCOUNTER — Encounter: Payer: Self-pay | Admitting: Cardiovascular Disease

## 2024-07-03 VITALS — BP 110/58 | HR 82 | Ht 69.0 in | Wt 137.1 lb

## 2024-07-03 DIAGNOSIS — I340 Nonrheumatic mitral (valve) insufficiency: Secondary | ICD-10-CM

## 2024-07-03 DIAGNOSIS — I5022 Chronic systolic (congestive) heart failure: Secondary | ICD-10-CM | POA: Diagnosis not present

## 2024-07-03 DIAGNOSIS — I491 Atrial premature depolarization: Secondary | ICD-10-CM

## 2024-07-03 DIAGNOSIS — I10 Essential (primary) hypertension: Secondary | ICD-10-CM | POA: Diagnosis not present

## 2024-07-03 DIAGNOSIS — Z9889 Other specified postprocedural states: Secondary | ICD-10-CM | POA: Diagnosis not present

## 2024-07-03 NOTE — Patient Instructions (Signed)
 Medication Instructions:  No changes *If you need a refill on your cardiac medications before your next appointment, please call your pharmacy*  Lab Work: None ordered If you have labs (blood work) drawn today and your tests are completely normal, you will receive your results only by: MyChart Message (if you have MyChart) OR A paper copy in the mail If you have any lab test that is abnormal or we need to change your treatment, we will call you to review the results.  Testing/Procedures: Your physician has requested that you have an echocardiogram in one year. Echocardiography is a painless test that uses sound waves to create images of your heart. It provides your doctor with information about the size and shape of your heart and how well your hearts chambers and valves are working. This procedure takes approximately one hour. There are no restrictions for this procedure. Please do NOT wear cologne, perfume, aftershave, or lotions (deodorant is allowed). Please arrive 15 minutes prior to your appointment time.  Please note: We ask at that you not bring children with you during ultrasound (echo/ vascular) testing. Due to room size and safety concerns, children are not allowed in the ultrasound rooms during exams. Our front office staff cannot provide observation of children in our lobby area while testing is being conducted. An adult accompanying a patient to their appointment will only be allowed in the ultrasound room at the discretion of the ultrasound technician under special circumstances. We apologize for any inconvenience.   Follow-Up: At [facility], you and your health needs are our priority.  As part of our continuing mission to provide you with exceptional heart care, our providers are all part of one team.  This team includes your primary Cardiologist (physician) and Advanced Practice Providers or APPs (Physician Assistants and Nurse Practitioners) who all work together to  provide you with the care you need, when you need it.  Your next appointment:    1 yr (after Echo)   Provider:   Darryle ONEIDA Decent, MD    We recommend signing up for the patient portal called MyChart.  Sign up information is provided on this After Visit Summary.  MyChart is used to connect with patients for Virtual Visits (Telemedicine).  Patients are able to view lab/test results, encounter notes, upcoming appointments, etc.  Non-urgent messages can be sent to your provider as well.   To learn more about what you can do with MyChart, go to [email or web address].

## 2024-07-15 ENCOUNTER — Telehealth: Payer: Self-pay

## 2024-07-15 NOTE — Telephone Encounter (Signed)
 Monday 07/16/24 appointments canceled. Patient is aware and scheduling will call to reschedule.

## 2024-07-16 ENCOUNTER — Inpatient Hospital Stay

## 2024-07-16 ENCOUNTER — Inpatient Hospital Stay: Admitting: Oncology

## 2024-07-17 ENCOUNTER — Encounter: Payer: Self-pay | Admitting: Family Medicine

## 2024-08-28 ENCOUNTER — Inpatient Hospital Stay: Admitting: Oncology

## 2024-08-28 ENCOUNTER — Inpatient Hospital Stay
